# Patient Record
Sex: Male | Born: 1975
Health system: Southern US, Community
[De-identification: ages and names within clinical notes are randomized; demographics above are authoritative.]

## PROBLEM LIST (undated history)

## (undated) DIAGNOSIS — Z952 Presence of prosthetic heart valve: Secondary | ICD-10-CM

## (undated) DIAGNOSIS — I428 Other cardiomyopathies: Secondary | ICD-10-CM

## (undated) DIAGNOSIS — I350 Nonrheumatic aortic (valve) stenosis: Secondary | ICD-10-CM

## (undated) DIAGNOSIS — I509 Heart failure, unspecified: Secondary | ICD-10-CM

## (undated) DIAGNOSIS — Q231 Congenital insufficiency of aortic valve: Secondary | ICD-10-CM

## (undated) HISTORY — DX: Presence of prosthetic heart valve: Z95.2

## (undated) HISTORY — DX: Nonrheumatic aortic (valve) stenosis: I35.0

## (undated) HISTORY — PX: SURGERY SCROTAL / TESTICULAR: SUR1316

## (undated) HISTORY — DX: Other cardiomyopathies: I42.8

## (undated) HISTORY — DX: Congenital insufficiency of aortic valve: Q23.1

---

## 2009-01-26 ENCOUNTER — Emergency Department: Payer: Self-pay | Admitting: Emergency Medicine

## 2009-01-30 ENCOUNTER — Emergency Department: Payer: Self-pay | Admitting: Emergency Medicine

## 2009-10-11 ENCOUNTER — Emergency Department: Payer: Self-pay | Admitting: Emergency Medicine

## 2009-10-12 ENCOUNTER — Emergency Department: Payer: Self-pay | Admitting: Emergency Medicine

## 2013-07-29 ENCOUNTER — Emergency Department: Payer: Self-pay | Admitting: Emergency Medicine

## 2014-09-27 DIAGNOSIS — Q231 Congenital insufficiency of aortic valve: Secondary | ICD-10-CM

## 2014-09-27 DIAGNOSIS — I428 Other cardiomyopathies: Secondary | ICD-10-CM

## 2014-09-27 DIAGNOSIS — Q2381 Bicuspid aortic valve: Secondary | ICD-10-CM

## 2014-09-27 HISTORY — DX: Other cardiomyopathies: I42.8

## 2014-09-27 HISTORY — DX: Congenital insufficiency of aortic valve: Q23.1

## 2014-09-27 HISTORY — DX: Bicuspid aortic valve: Q23.81

## 2014-10-03 ENCOUNTER — Inpatient Hospital Stay
Admission: EM | Admit: 2014-10-03 | Discharge: 2014-10-04 | DRG: 229 | Disposition: A | Discharge status: Other Acute Inpatient Hospital | Payer: Self-pay | Attending: Internal Medicine | Admitting: Internal Medicine

## 2014-10-03 ENCOUNTER — Encounter: Payer: Self-pay | Admitting: *Deleted

## 2014-10-03 ENCOUNTER — Emergency Department: Payer: Self-pay

## 2014-10-03 DIAGNOSIS — R079 Chest pain, unspecified: Secondary | ICD-10-CM

## 2014-10-03 DIAGNOSIS — I252 Old myocardial infarction: Secondary | ICD-10-CM | POA: Diagnosis present

## 2014-10-03 DIAGNOSIS — I442 Atrioventricular block, complete: Secondary | ICD-10-CM | POA: Diagnosis present

## 2014-10-03 DIAGNOSIS — I350 Nonrheumatic aortic (valve) stenosis: Secondary | ICD-10-CM

## 2014-10-03 DIAGNOSIS — R0609 Other forms of dyspnea: Secondary | ICD-10-CM | POA: Diagnosis present

## 2014-10-03 DIAGNOSIS — F17210 Nicotine dependence, cigarettes, uncomplicated: Secondary | ICD-10-CM | POA: Diagnosis present

## 2014-10-03 DIAGNOSIS — R0600 Dyspnea, unspecified: Secondary | ICD-10-CM | POA: Diagnosis present

## 2014-10-03 DIAGNOSIS — I214 Non-ST elevation (NSTEMI) myocardial infarction: Principal | ICD-10-CM | POA: Diagnosis present

## 2014-10-03 MED ORDER — SODIUM CHLORIDE 0.9 % IV BOLUS (SEPSIS)
1000.0000 mL | Freq: Once | INTRAVENOUS | Status: AC
  Administered 2014-10-03: 1000 mL via INTRAVENOUS

## 2014-10-03 MED ORDER — ASPIRIN 81 MG PO CHEW
CHEWABLE_TABLET | ORAL | Status: AC
  Administered 2014-10-03: 324 mg
  Filled 2014-10-03: qty 4

## 2014-10-03 MED ORDER — ASPIRIN 81 MG PO CHEW
324.0000 mg | CHEWABLE_TABLET | Freq: Once | ORAL | Status: AC
  Administered 2014-10-03: 324 mg via ORAL

## 2014-10-03 NOTE — ED Notes (Signed)
Pt placed on oxygen at 2 lpm via n/c.

## 2014-10-03 NOTE — ED Notes (Addendum)
Pt c/o chest and epigastric pain that is reproducible w/ palpation, movement, cough and inspiration. Pt c/o nausea, denies vomiting. Pt states pain worsening x 30 minutes ago. Pt describes pain as pressure. Pain is non-radiating and makes pt feel short of breath.

## 2014-10-03 NOTE — ED Provider Notes (Signed)
[facility] Emergency Department Provider Note  ____________________________________________  Time seen: 2325  I have reviewed the triage vital signs and the nursing notes.   HISTORY  Chief Complaint Pleurisy  chest pain, upper abdominal pain, nausea   HPI Salif Tay is a 39 y.o. male without known medical problems and he does not take any medications who reports he began having chest pain yesterday and it became worse tonight. The pain is in the lower chest with some into the upper abdomen. The pain is moderate to severe. The patient looks uncomfortable. The pain is not radiating to arms or jaw. He has not had pain like this before. He does feel nauseous with no emesis. He does smoke 4-5 cigarettes per day. He does not have a significant family history for cardiac disease.   History reviewed. No pertinent past medical history.  Patient Active Problem List   Diagnosis Date Noted  . NSTEMI, initial episode of care 10/04/2014  . Third degree heart block 10/04/2014    Past Surgical History  Procedure Laterality Date  . Surgery scrotal / testicular      for undescended testicle as a child    No current outpatient prescriptions on file.  Allergies Review of patient's allergies indicates no known allergies.  History reviewed. No pertinent family history.  Social History History  Substance Use Topics  . Smoking status: Current Every Day Smoker -- 0.50 packs/day for 20 years    Types: Cigarettes  . Smokeless tobacco: Never Used  . Alcohol Use: 0.0 oz/week    0 Standard drinks or equivalent per week     Comment: rarely    Review of Systems Constitutional: Negative for fever. ENT: Negative for sore throat. Cardiovascular: Positive for chest pain. Respiratory: Negative for shortness of breath. Gastrointestinal: Positive for abdominal pain, with nausea Genitourinary: Negative for dysuria. Musculoskeletal: Negative for back pain. Skin:  Negative for rash. Neurological: Negative for headaches   10-point ROS otherwise negative.  ____________________________________________   PHYSICAL EXAM:  VITAL SIGNS: ED Triage Vitals  Enc Vitals Group     BP 10/03/14 2308 117/45 mmHg     Pulse Rate 10/03/14 2308 58     Resp 10/03/14 2308 22     Temp 10/03/14 2308 100.7 F (38.2 C)     Temp Source 10/03/14 2308 Oral     SpO2 10/03/14 2308 83 %     Weight 10/03/14 2308 220 lb (99.791 kg)     Height 10/03/14 2308  (1.88 m)     Head Cir --      Peak Flow --      Pain Score 10/03/14 2309 10     Pain Loc --      Pain Edu? --      Excl. in GC? --     Constitutional: Alert and oriented. He appears uncomfortable.Marland Kitchen ENT   Head: Normocephalic and atraumatic.   Nose: No congestion/rhinnorhea.   Mouth/Throat: Mucous membranes are moist. Cardiovascular: Normal rate, regular rhythm. Occasional PVC on EKG Respiratory: Normal respiratory effort without tachypnea. Breath sounds are clear and equal bilaterally. No wheezes/rales/rhonchi. Gastrointestinal: Soft , tender in the epigastric area and the right lower quadrant. He had minimal tenderness in the right upper quadrant, but on reexam immediately during the first examination he displayed additional tenderness. Back: There is no CVA tenderness. Musculoskeletal: Nontender with normal range of motion in all extremities.  No noted edema. Neurologic:  Normal speech and language. No gross focal neurologic deficits are  appreciated.  Skin:  Skin is warm, dry. No rash noted. Psychiatric: Mood and affect are normal. Speech and behavior are normal.  ____________________________________________     INITIAL IMPRESSION / ASSESSMENT AND PLAN / ED COURSE  It is unclear what is causing this patient's discomfort and distress. We are considering both cardiac and abdominal issues for this patient. The EKG looks somewhat abnormal although not indicative of an acute MI. We will continue  to focus on possible cardiac issues as well as abdominal issues. Will order an ultrasound of the right upper quadrant and check a lipase level.   LABS (pertinent positives/negatives)  Notable for elevated white blood cell count of 17.1 hemoglobin of 13.6. Electrolytes are normal overall with normal renal function. He does have an elevated troponin at 0.08.   ____________________________________________   EKG  At 2323 undetermined rhythm at a rate of 93. It does seem to appear to be a lack of correlation with P waves and QRS, worrisome for third-degree block. We will repeat EKG.   Repeat EKG at 120 shows a change from was likely a third-degree block to sinus bradycardia with a first-degree block. The rate is 49. The PR interval is 296. QTC is 496 with a normal access. ____________________________________________    RADIOLOGY  Chest x-ray shows mild congestion.  ____________________________________________   PROCEDURES  Procedure(s) performed: None  Critical Care performed: Yes, see critical care note(s)  ____________________________________________   INITIAL IMPRESSION / ASSESSMENT AND PLAN / ED COURSE  ----------------------------------------- 1:36 AM on 10/04/2014 -----------------------------------------  At this point the blood tests are back. The EKG has changed to a first-degree block from what appeared to be a third-degree block. The patient continues to have some discomfort. It is not clear at this time if he is having a N STEMI or there is a different cause for his mild elevation in troponin and discomfort. I have discussed the case with Dr. Sherry Ruffing who will admit him to the hospital.  Due to the complexity of this patient's case and complex differential diagnosis, critical care time of 35 minutes was spent caring for the patient discussing the situation with him and speaking with other physicians.  ____________________________________________   FINAL CLINICAL  IMPRESSION(S) / ED DIAGNOSES  Final diagnoses:  Chest pain, unspecified chest pain type  Third degree heart block      Darien Ramus, MD 10/04/14 [phone]

## 2014-10-04 ENCOUNTER — Encounter: Admission: EM | Payer: Self-pay | Source: Home / Self Care | Attending: Internal Medicine

## 2014-10-04 ENCOUNTER — Inpatient Hospital Stay: Payer: Self-pay

## 2014-10-04 ENCOUNTER — Encounter: Payer: Self-pay | Admitting: Physician Assistant

## 2014-10-04 ENCOUNTER — Inpatient Hospital Stay (HOSPITAL_COMMUNITY)
Admission: EM | Admit: 2014-10-04 | Discharge: 2014-10-07 | DRG: 280 | Disposition: A | Discharge status: Home / Self Care | Payer: Self-pay | Source: Other Acute Inpatient Hospital | Attending: Cardiology | Admitting: Cardiology

## 2014-10-04 ENCOUNTER — Encounter: Payer: Self-pay | Admitting: Internal Medicine

## 2014-10-04 DIAGNOSIS — R509 Fever, unspecified: Secondary | ICD-10-CM | POA: Diagnosis present

## 2014-10-04 DIAGNOSIS — R0600 Dyspnea, unspecified: Secondary | ICD-10-CM | POA: Diagnosis present

## 2014-10-04 DIAGNOSIS — I509 Heart failure, unspecified: Secondary | ICD-10-CM

## 2014-10-04 DIAGNOSIS — I420 Dilated cardiomyopathy: Secondary | ICD-10-CM | POA: Diagnosis present

## 2014-10-04 DIAGNOSIS — J9601 Acute respiratory failure with hypoxia: Secondary | ICD-10-CM | POA: Diagnosis present

## 2014-10-04 DIAGNOSIS — D72829 Elevated white blood cell count, unspecified: Secondary | ICD-10-CM | POA: Diagnosis present

## 2014-10-04 DIAGNOSIS — I252 Old myocardial infarction: Secondary | ICD-10-CM | POA: Diagnosis present

## 2014-10-04 DIAGNOSIS — F17200 Nicotine dependence, unspecified, uncomplicated: Secondary | ICD-10-CM | POA: Diagnosis present

## 2014-10-04 DIAGNOSIS — I214 Non-ST elevation (NSTEMI) myocardial infarction: Principal | ICD-10-CM | POA: Diagnosis present

## 2014-10-04 DIAGNOSIS — R0609 Other forms of dyspnea: Secondary | ICD-10-CM | POA: Diagnosis present

## 2014-10-04 DIAGNOSIS — I440 Atrioventricular block, first degree: Secondary | ICD-10-CM | POA: Diagnosis present

## 2014-10-04 DIAGNOSIS — Q231 Congenital insufficiency of aortic valve: Secondary | ICD-10-CM

## 2014-10-04 DIAGNOSIS — I5041 Acute combined systolic (congestive) and diastolic (congestive) heart failure: Secondary | ICD-10-CM | POA: Diagnosis present

## 2014-10-04 DIAGNOSIS — F17210 Nicotine dependence, cigarettes, uncomplicated: Secondary | ICD-10-CM | POA: Diagnosis present

## 2014-10-04 DIAGNOSIS — I350 Nonrheumatic aortic (valve) stenosis: Secondary | ICD-10-CM

## 2014-10-04 DIAGNOSIS — I442 Atrioventricular block, complete: Secondary | ICD-10-CM | POA: Diagnosis present

## 2014-10-04 DIAGNOSIS — I200 Unstable angina: Secondary | ICD-10-CM

## 2014-10-04 DIAGNOSIS — K088 Other specified disorders of teeth and supporting structures: Secondary | ICD-10-CM | POA: Diagnosis present

## 2014-10-04 DIAGNOSIS — K089 Disorder of teeth and supporting structures, unspecified: Secondary | ICD-10-CM | POA: Diagnosis present

## 2014-10-04 HISTORY — DX: Nonrheumatic aortic (valve) stenosis: I35.0

## 2014-10-04 HISTORY — PX: CARDIAC CATHETERIZATION: SHX172

## 2014-10-04 LAB — CBC WITH DIFFERENTIAL/PLATELET
BASOS ABS: 0.1 10*3/uL (ref 0–0.1)
BASOS PCT: 0 %
Basophils Absolute: 0.1 10*3/uL (ref 0–0.1)
Basophils Relative: 0 %
EOS ABS: 0.1 10*3/uL (ref 0–0.7)
EOS ABS: 0.1 10*3/uL (ref 0–0.7)
EOS PCT: 1 %
Eosinophils Relative: 1 %
HCT: 40.7 % (ref 40.0–52.0)
HCT: 39.7 % — ABNORMAL LOW (ref 40.0–52.0)
Hemoglobin: 13.6 g/dL (ref 13.0–18.0)
Hemoglobin: 13.1 g/dL (ref 13.0–18.0)
Lymphocytes Relative: 12 %
Lymphocytes Relative: 19 %
Lymphs Abs: 2 10*3/uL (ref 1.0–3.6)
Lymphs Abs: 3 10*3/uL (ref 1.0–3.6)
MCH: 30.8 pg (ref 26.0–34.0)
MCH: 30.5 pg (ref 26.0–34.0)
MCHC: 33.4 g/dL (ref 32.0–36.0)
MCHC: 33 g/dL (ref 32.0–36.0)
MCV: 92.3 fL (ref 80.0–100.0)
MCV: 92.5 fL (ref 80.0–100.0)
Monocytes Absolute: 0.8 10*3/uL (ref 0.2–1.0)
Monocytes Absolute: 0.9 10*3/uL (ref 0.2–1.0)
Monocytes Relative: 5 %
Monocytes Relative: 6 %
NEUTROS PCT: 82 %
NEUTROS PCT: 74 %
Neutro Abs: 14.1 10*3/uL — ABNORMAL HIGH (ref 1.4–6.5)
Neutro Abs: 11.8 10*3/uL — ABNORMAL HIGH (ref 1.4–6.5)
PLATELETS: 136 10*3/uL — AB (ref 150–440)
PLATELETS: 139 10*3/uL — AB (ref 150–440)
RBC: 4.41 MIL/uL (ref 4.40–5.90)
RBC: 4.29 MIL/uL — ABNORMAL LOW (ref 4.40–5.90)
RDW: 13.5 % (ref 11.5–14.5)
RDW: 13.3 % (ref 11.5–14.5)
WBC: 17.1 10*3/uL — ABNORMAL HIGH (ref 3.8–10.6)
WBC: 16 10*3/uL — AB (ref 3.8–10.6)

## 2014-10-04 LAB — POCT I-STAT 3, ART BLOOD GAS (G3+)
ACID-BASE DEFICIT: 3 mmol/L — AB (ref 0.0–2.0)
Bicarbonate: 19.9 mEq/L — ABNORMAL LOW (ref 20.0–24.0)
O2 Saturation: 79 %
PH ART: 7.459 — AB (ref 7.350–7.450)
PO2 ART: 40 mmHg — AB (ref 80.0–100.0)
TCO2: 21 mmol/L (ref 0–100)
pCO2 arterial: 28 mmHg — ABNORMAL LOW (ref 35.0–45.0)

## 2014-10-04 LAB — TROPONIN I
TROPONIN I: 0.08 ng/mL — AB (ref <0.031)
TROPONIN I: 0.06 ng/mL — AB (ref <0.031)
TROPONIN I: 0.08 ng/mL — AB (ref <0.031)
Troponin I: 0.14 ng/mL — ABNORMAL HIGH (ref <0.031)

## 2014-10-04 LAB — URINE DRUG SCREEN, QUALITATIVE (ARMC ONLY)
AMPHETAMINES, UR SCREEN: NOT DETECTED
Barbiturates, Ur Screen: NOT DETECTED
Benzodiazepine, Ur Scrn: NOT DETECTED
Cannabinoid 50 Ng, Ur ~~LOC~~: NOT DETECTED
Cocaine Metabolite,Ur ~~LOC~~: NOT DETECTED
MDMA (Ecstasy)Ur Screen: NOT DETECTED
METHADONE SCREEN, URINE: NOT DETECTED
OPIATE, UR SCREEN: NOT DETECTED
Phencyclidine (PCP) Ur S: NOT DETECTED
TRICYCLIC, UR SCREEN: NOT DETECTED

## 2014-10-04 LAB — MRSA PCR SCREENING: MRSA by PCR: NEGATIVE

## 2014-10-04 LAB — COMPREHENSIVE METABOLIC PANEL
ALBUMIN: 3.7 g/dL (ref 3.5–5.0)
ALK PHOS: 65 U/L (ref 38–126)
ALT: 35 U/L (ref 17–63)
ALT: 31 U/L (ref 17–63)
ANION GAP: 8 (ref 5–15)
ANION GAP: 8 (ref 5–15)
AST: 35 U/L (ref 15–41)
AST: 27 U/L (ref 15–41)
Albumin: 4.2 g/dL (ref 3.5–5.0)
Alkaline Phosphatase: 60 U/L (ref 38–126)
BILIRUBIN TOTAL: 1.5 mg/dL — AB (ref 0.3–1.2)
BILIRUBIN TOTAL: 2.2 mg/dL — AB (ref 0.3–1.2)
BUN: 14 mg/dL (ref 6–20)
BUN: 13 mg/dL (ref 6–20)
CALCIUM: 8.9 mg/dL (ref 8.9–10.3)
CALCIUM: 8.5 mg/dL — AB (ref 8.9–10.3)
CHLORIDE: 110 mmol/L (ref 101–111)
CO2: 23 mmol/L (ref 22–32)
CO2: 23 mmol/L (ref 22–32)
Chloride: 107 mmol/L (ref 101–111)
Creatinine, Ser: 1.02 mg/dL (ref 0.61–1.24)
Creatinine, Ser: 0.88 mg/dL (ref 0.61–1.24)
Glucose, Bld: 118 mg/dL — ABNORMAL HIGH (ref 65–99)
Glucose, Bld: 110 mg/dL — ABNORMAL HIGH (ref 65–99)
Potassium: 4.2 mmol/L (ref 3.5–5.1)
Potassium: 4.1 mmol/L (ref 3.5–5.1)
SODIUM: 138 mmol/L (ref 135–145)
Sodium: 141 mmol/L (ref 135–145)
Total Protein: 7.4 g/dL (ref 6.5–8.1)
Total Protein: 7.1 g/dL (ref 6.5–8.1)

## 2014-10-04 LAB — URINALYSIS COMPLETE WITH MICROSCOPIC (ARMC ONLY)
BILIRUBIN URINE: NEGATIVE
Glucose, UA: NEGATIVE mg/dL
HGB URINE DIPSTICK: NEGATIVE
KETONES UR: NEGATIVE mg/dL
NITRITE: NEGATIVE
PH: 7 (ref 5.0–8.0)
Protein, ur: NEGATIVE mg/dL
SQUAMOUS EPITHELIAL / LPF: NONE SEEN
Specific Gravity, Urine: 1.009 (ref 1.005–1.030)

## 2014-10-04 LAB — URINALYSIS, ROUTINE W REFLEX MICROSCOPIC
Bilirubin Urine: NEGATIVE
Glucose, UA: NEGATIVE mg/dL
Hgb urine dipstick: NEGATIVE
Ketones, ur: NEGATIVE mg/dL
LEUKOCYTES UA: NEGATIVE
NITRITE: NEGATIVE
Protein, ur: NEGATIVE mg/dL
Specific Gravity, Urine: 1.015 (ref 1.005–1.030)
Urobilinogen, UA: 0.2 mg/dL (ref 0.0–1.0)
pH: 5.5 (ref 5.0–8.0)

## 2014-10-04 LAB — CK TOTAL AND CKMB (NOT AT ARMC)
CK, MB: 2.7 ng/mL (ref 0.5–5.0)
Relative Index: 1.9 (ref 0.0–2.5)
Total CK: 142 U/L (ref 49–397)

## 2014-10-04 LAB — TSH: TSH: 1.917 u[IU]/mL (ref 0.350–4.500)

## 2014-10-04 LAB — HEPARIN LEVEL (UNFRACTIONATED): Heparin Unfractionated: 0.41 IU/mL (ref 0.30–0.70)

## 2014-10-04 LAB — POCT ACTIVATED CLOTTING TIME: Activated Clotting Time: 429 seconds

## 2014-10-04 LAB — BRAIN NATRIURETIC PEPTIDE: B NATRIURETIC PEPTIDE 5: 1037.2 pg/mL — AB (ref 0.0–100.0)

## 2014-10-04 LAB — LIPASE, BLOOD: Lipase: 24 U/L (ref 22–51)

## 2014-10-04 LAB — MAGNESIUM: Magnesium: 1.8 mg/dL (ref 1.7–2.4)

## 2014-10-04 SURGERY — LEFT HEART CATH AND CORONARY ANGIOGRAPHY
Anesthesia: LOCAL

## 2014-10-04 MED ORDER — SODIUM CHLORIDE 0.9 % IJ SOLN: 3.0000 mL | Freq: Two times a day (BID) | INTRAMUSCULAR | Status: DC

## 2014-10-04 MED ORDER — MORPHINE SULFATE 2 MG/ML IJ SOLN
2.0000 mg | INTRAMUSCULAR | Status: DC | PRN
  Administered 2014-10-04: 2 mg via INTRAVENOUS
  Filled 2014-10-04 (×2): qty 1

## 2014-10-04 MED ORDER — ONDANSETRON HCL 4 MG/2ML IJ SOLN: 4.0000 mg | Freq: Four times a day (QID) | INTRAMUSCULAR | Status: DC | PRN

## 2014-10-04 MED ORDER — SODIUM CHLORIDE 0.9 % IJ SOLN: 3.0000 mL | INTRAMUSCULAR | Status: DC | PRN

## 2014-10-04 MED ORDER — ATORVASTATIN CALCIUM 80 MG PO TABS
80.0000 mg | ORAL_TABLET | Freq: Every day | ORAL | Status: DC
  Administered 2014-10-05 – 2014-10-06 (×2): 80 mg via ORAL
  Filled 2014-10-04 (×4): qty 1

## 2014-10-04 MED ORDER — IOHEXOL 350 MG/ML SOLN
INTRAVENOUS | Status: DC | PRN
Start: 2014-10-04 — End: 2014-10-04
  Administered 2014-10-04: 175 mL via INTRA_ARTERIAL

## 2014-10-04 MED ORDER — ASPIRIN EC 325 MG PO TBEC
325.0000 mg | DELAYED_RELEASE_TABLET | Freq: Every day | ORAL | Status: DC
  Filled 2014-10-04: qty 1

## 2014-10-04 MED ORDER — FUROSEMIDE 10 MG/ML IJ SOLN
INTRAMUSCULAR | Status: DC | PRN
  Administered 2014-10-04: 40 mg via INTRAVENOUS

## 2014-10-04 MED ORDER — ACETAMINOPHEN 325 MG PO TABS: 650.0000 mg | ORAL_TABLET | ORAL | Status: DC | PRN

## 2014-10-04 MED ORDER — SODIUM CHLORIDE 0.9 % IJ SOLN
3.0000 mL | Freq: Two times a day (BID) | INTRAMUSCULAR | Status: DC
  Administered 2014-10-04 – 2014-10-06 (×4): 3 mL via INTRAVENOUS
  Administered 2014-10-07: 10 mL via INTRAVENOUS

## 2014-10-04 MED ORDER — MIDAZOLAM HCL 2 MG/2ML IJ SOLN
INTRAMUSCULAR | Status: DC | PRN
  Administered 2014-10-04: 1 mg via INTRAVENOUS

## 2014-10-04 MED ORDER — ALPRAZOLAM 0.25 MG PO TABS: 0.2500 mg | ORAL_TABLET | Freq: Four times a day (QID) | ORAL | Status: DC | PRN

## 2014-10-04 MED ORDER — ACETAMINOPHEN 325 MG PO TABS
650.0000 mg | ORAL_TABLET | Freq: Four times a day (QID) | ORAL | Status: DC | PRN
Start: 2014-10-04 — End: 2014-10-04

## 2014-10-04 MED ORDER — ACETAMINOPHEN 650 MG RE SUPP: 650.0000 mg | Freq: Four times a day (QID) | RECTAL | Status: DC | PRN

## 2014-10-04 MED ORDER — SODIUM CHLORIDE 0.9 % IV SOLN: 250.0000 mL | INTRAVENOUS | Status: DC | PRN

## 2014-10-04 MED ORDER — HEPARIN BOLUS VIA INFUSION
4000.0000 [IU] | Freq: Once | INTRAVENOUS | Status: AC
  Administered 2014-10-04: 4000 [IU] via INTRAVENOUS
  Filled 2014-10-04: qty 4000

## 2014-10-04 MED ORDER — ATORVASTATIN CALCIUM 20 MG PO TABS: 80.0000 mg | ORAL_TABLET | Freq: Every day | ORAL | Status: DC

## 2014-10-04 MED ORDER — ENOXAPARIN SODIUM 100 MG/ML ~~LOC~~ SOLN
1.0000 mg/kg | Freq: Two times a day (BID) | SUBCUTANEOUS | Status: DC
  Administered 2014-10-04: 100 mg via SUBCUTANEOUS
  Filled 2014-10-04: qty 1

## 2014-10-04 MED ORDER — PNEUMOCOCCAL VAC POLYVALENT 25 MCG/0.5ML IJ INJ
0.5000 mL | INJECTION | INTRAMUSCULAR | Status: DC
  Filled 2014-10-04: qty 0.5

## 2014-10-04 MED ORDER — FUROSEMIDE 10 MG/ML IJ SOLN: 40.0000 mg | Freq: Two times a day (BID) | INTRAMUSCULAR | Status: DC

## 2014-10-04 MED ORDER — NITROGLYCERIN 0.4 MG SL SUBL
0.4000 mg | SUBLINGUAL_TABLET | SUBLINGUAL | Status: DC | PRN
  Administered 2014-10-04: 0.4 mg via SUBLINGUAL
  Filled 2014-10-04 (×2): qty 1

## 2014-10-04 MED ORDER — DIAZEPAM 2 MG PO TABS: 2.0000 mg | ORAL_TABLET | ORAL | Status: DC | PRN

## 2014-10-04 MED ORDER — ZOLPIDEM TARTRATE 5 MG PO TABS: 5.0000 mg | ORAL_TABLET | Freq: Every evening | ORAL | Status: DC | PRN

## 2014-10-04 MED ORDER — SODIUM CHLORIDE 0.9 % WEIGHT BASED INFUSION: 1.0000 mL/kg/h | INTRAVENOUS | Status: DC

## 2014-10-04 MED ORDER — HEPARIN (PORCINE) IN NACL 100-0.45 UNIT/ML-% IJ SOLN
1600.0000 [IU]/h | INTRAMUSCULAR | Status: DC
  Administered 2014-10-04: 1300 [IU]/h via INTRAVENOUS
  Administered 2014-10-05: 1600 [IU]/h via INTRAVENOUS
  Filled 2014-10-04 (×4): qty 250

## 2014-10-04 MED ORDER — ONDANSETRON HCL 4 MG PO TABS: 4.0000 mg | ORAL_TABLET | Freq: Four times a day (QID) | ORAL | Status: DC | PRN

## 2014-10-04 MED ORDER — SODIUM CHLORIDE 0.9 % IV SOLN
INTRAVENOUS | Status: DC
  Administered 2014-10-04 – 2014-10-05 (×2): via INTRAVENOUS

## 2014-10-04 MED ORDER — IPRATROPIUM-ALBUTEROL 0.5-2.5 (3) MG/3ML IN SOLN: 3.0000 mL | RESPIRATORY_TRACT | Status: DC | PRN

## 2014-10-04 MED ORDER — NITROGLYCERIN 0.4 MG SL SUBL: 0.4000 mg | SUBLINGUAL_TABLET | SUBLINGUAL | Status: DC | PRN

## 2014-10-04 MED ORDER — ENOXAPARIN SODIUM 40 MG/0.4ML ~~LOC~~ SOLN: 40.0000 mg | SUBCUTANEOUS | Status: DC

## 2014-10-04 MED ORDER — MORPHINE SULFATE 2 MG/ML IJ SOLN
1.0000 mg | INTRAMUSCULAR | Status: DC | PRN
Start: 2014-10-04 — End: 2014-10-04

## 2014-10-04 MED ORDER — MORPHINE SULFATE 2 MG/ML IJ SOLN
2.0000 mg | INTRAMUSCULAR | Status: AC
  Administered 2014-10-04: 2 mg via INTRAVENOUS

## 2014-10-04 MED ORDER — ACETAMINOPHEN 325 MG PO TABS
650.0000 mg | ORAL_TABLET | ORAL | Status: DC | PRN
  Administered 2014-10-04 – 2014-10-05 (×3): 650 mg via ORAL
  Filled 2014-10-04 (×3): qty 2

## 2014-10-04 MED ORDER — ASPIRIN EC 81 MG PO TBEC
81.0000 mg | DELAYED_RELEASE_TABLET | Freq: Every day | ORAL | Status: DC
  Administered 2014-10-05 – 2014-10-07 (×3): 81 mg via ORAL
  Filled 2014-10-04 (×3): qty 1

## 2014-10-04 SURGICAL SUPPLY — 16 items
CATH INFINITI 5FR ANG PIGTAIL (CATHETERS) ×3
CATH OPTITORQUE TIG 4.0 5F (CATHETERS) ×3
CATH S G BIP PACING (SET/KITS/TRAYS/PACK) ×3
CATHETER LAUNCHER 6FR 3DRIGHT (CATHETERS) ×3
DEVICE WIRE ANGIOSEAL 6FR (Vascular Products) ×3 IMPLANT
ELECT DEFIB PAD ADLT CADENCE (PAD) ×3
KIT ENCORE 26 ADVANTAGE (KITS) ×3
KIT HEART LEFT (KITS) ×3
PACK CARDIAC CATHETERIZATION (CUSTOM PROCEDURE TRAY) ×3
SHEATH PINNACLE 5F 10CM (SHEATH)
SHEATH PINNACLE 6F 10CM (SHEATH) ×6
SLEEVE REPOSITIONING LENGTH 30 (MISCELLANEOUS) ×3
SYR MEDRAD MARK V 150ML (SYRINGE) ×3
TRANSDUCER W/STOPCOCK (MISCELLANEOUS) ×3
TUBING CIL FLEX 10 FLL-RA (TUBING) ×3
WIRE EMERALD 3MM-J .035X150CM (WIRE) ×3

## 2014-10-04 NOTE — H&P (Signed)
History and Physical   Patient ID: Nicholas Horton MRN: [id number], DOB/AGE: 1975-09-09 39 y.o. Date of Encounter: 10/04/2014  Primary Physician: No PCP Per Patient Primary Cardiologist: [place] - [place]  Chief Complaint:  STEMI  HPI: Nicholas Horton is a 39 y.o. male with no history of CAD. He has no ongoing medical issues. He has not had a recent checkup.   He went to [facility] 10/03/2014 for chest pain and SOB.   The patient had a cough and SOB starting 3 days ago. He then developed palpitations and chest pain, pressure. Upon arrival at [facility], he was in 3rd degree heart block, heart rates in the 40s and 50s in a junctional rhythm. His symptoms did not improve and his troponin was elevated. Of note he had a low-grade fever of 100.7.   He was anticoagulated with Lovenox 100 mg at 2:52 am, given SL NTG x 1, Zofran 4 mg, ASA 324 mg, nebulizers, Lipitor 80 mg and also given 2 mg morphine.   Because of the 3rd degree heart block and the elevated enzymes as well as ongoing chest pain, he was transferred emergently to [facility] and taken directly to the cath lab.  No past medical history on file. Pt not aware of any ongoing medical problems, no history of DM, HTN, HL.    Surgical History:  Past Surgical History  Procedure Laterality Date  . Surgery scrotal / testicular      for undescended testicle as a child     I have reviewed the patient's current medications. Prior to Admission medications   None   Allergies: No Known Allergies  History   Social History  . Marital Status: Single    Spouse Name: N/A  . Number of Children: N/A  . Years of Education: N/A   Occupational History  . 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    Social History Main Topics  . Smoking status: Current Every Day Smoker -- 0.50 packs/day for 20 years    Types: Cigarettes  . Smokeless tobacco: Never Used  . Alcohol Use: 0.0 oz/week    0 Standard drinks or equivalent per week     Comment: rarely  . Drug Use: No    . Sexual Activity: Not on file   Other Topics Concern  . Not on file   Social History Narrative   Lives in [place], [place] with fiance. No history of CAD or PPM, arrhythmia in parents or siblings.    No family history on file. Family Status  Relation Status Death Age  . Mother Alive   . Father Alive     Review of Systems:   Full 14-point review of systems otherwise negative except as noted above.  Physical Exam: General: Well developed, well nourished,male in acute distress. Head: Normocephalic, atraumatic, sclera non-icteric, no xanthomas, nares are without discharge. Dentition: very poor Neck: No carotid bruits. JVD not elevated. No thyromegally Lungs: Good expansion bilaterally. without wheezes or rhonchi. Rales bases Heart: Regular rate and rhythm with S1 S2.  No S3 or S4.  No murmur, no rubs, or gallops appreciated. Abdomen: Soft, non-tender, non-distended with normoactive bowel sounds. No hepatomegaly. No rebound/guarding. No obvious abdominal masses. Msk:  Strength and tone appear normal for age. No joint deformities or effusions, no spine or costo-vertebral angle tenderness. Extremities: No clubbing or cyanosis. No edema.  Distal pedal pulses are 2+ in 4 extrem Neuro: Alert and oriented X 3. Moves all extremities spontaneously. No focal deficits noted. Psych:  Responds to questions appropriately but is anxious. Skin: No rashes or lesions noted  Labs:   Lab Results  Component Value Date   WBC 16.0* 10/04/2014   HGB 13.1 10/04/2014   HCT 39.7* 10/04/2014   MCV 92.5 10/04/2014   PLT 139* 10/04/2014    Recent Labs Lab 10/03/14 2320  NA 138  K 4.2  CL 107  CO2 23  BUN 14  CREATININE 1.02  CALCIUM 8.9  PROT 7.4  BILITOT 1.5*  ALKPHOS 60  ALT 35  AST 35  GLUCOSE 118*    Recent Labs  10/03/14 2320  TROPONINI 0.08*    Radiology/Studies: Dg Chest Port 1 View 10/04/2014   CLINICAL DATA:  Sternal chest pain, onset yesterday and worsened today. Dyspnea.   EXAM: PORTABLE CHEST - 1 VIEW  COMPARISON:  10/13/2009  FINDINGS: Moderate vascular and interstitial congestive changes are present, new. There is mild ground-glass opacity in the central basilar regions which may represent alveolar edema. No large effusions are evident.  IMPRESSION: Probable congestive heart failure   Electronically Signed   By: Ellery Plunk M.D.   On: 10/04/2014 00:59   ECG: 3rd degree heart block, junctional rhythm  ASSESSMENT AND PLAN:  Principal Problem:   Third degree heart block Active Problems:   NSTEMI, initial episode of care  Pt is being taken directly to the cath lab with further evaluation and treatment depending on the results. We will check an echo, diurese as needed, and screen for cardiac risk factors.  Nicholas Horton 10/04/2014 8:12 AM Beeper [phone]   I have seen, examined and evaluated the patient this AM along with Ms. Doristine Devoid.Marland Kitchen  After reviewing all the available data and chart,  I agree with her findings, examination as well as impression recommendations.  39 year old gentleman presenting to [facility] with substernal chest pain and profound dyspnea found to be in complete heart block with accelerated junctional escape rhythm with some ventricular escape beats.  He was having persistent substernal chest pain and was therefore transferred to [facility] for acute evaluation with cardiac catheterization to exclude etiology for the complete heart block and chest pain. Also for likely placement of temporary pacemaker.  On arrival the patient was profoundly dyspneic requiring oxygen by nonrebreather. His blood pressures running 110 range and his escape rhythm was in the 50s with some rates in the 40s.  He was still having at least 6 out of 10 substernal chest pressure.  I did discuss the risks, benefits, alternatives and indications of the seizures with the patient, however based on his extreme hypoxic and  dyspneic state the considered emergency consent.   Nicholas Horton, M.D., M.S. Interventional Cardiologist   Pager # [phone]

## 2014-10-04 NOTE — ED Notes (Signed)
Pt assisted with urinal for urine sample. Pt with concentrated yellow urine output. Pt remains on defib pads, pt in first degree heart block. Oxygen continues to infuse at 3lpm via [place]. Pt continues to have tachypnea with rate 22.

## 2014-10-04 NOTE — ED Notes (Signed)
Pt with bigemeny, frequent pvc. Pt placed on crash cart defibrillator pads.

## 2014-10-04 NOTE — ED Notes (Signed)
Pt in first degree heart block.

## 2014-10-04 NOTE — ED Notes (Signed)
Critical troponin called from lab of 0.08. Dr. Almira Bar notified. Pt continues to complain of 9/10 chest pain with inspiration. md notified.

## 2014-10-04 NOTE — ED Notes (Signed)
Pt in 3rd degree heart block on monitor. Dr. Clint Guy at bedside.  Pt remains on defib pads.

## 2014-10-04 NOTE — H&P (Signed)
[facility] [place] [facility]   PATIENT NAME: Nicholas Horton    MR#:  [id number]  DATE OF BIRTH:  Mar 31, 1976   DATE OF ADMISSION:  10/03/2014  PRIMARY CARE PHYSICIAN: No PCP Per Patient   REQUESTING/REFERRING PHYSICIAN: Carollee Massed  CHIEF COMPLAINT:   Chief Complaint  Patient presents with  . Pleurisy   chest pain, shortness of breath  HISTORY OF PRESENT ILLNESS:  Nicholas Horton  is a 39 y.o. male without prior medical history presented with chest pain/shortness of breath. History aided by family member at bedside. They described 2-3 day duration of cough, nonproductive with associated shortness of breath mainly dyspnea on exertion. He is now experiencing palpitations, chest pain retrosternal in location, nonradiating intensity 7-8 out of 10, pressure in quality, no worsening or relieving factors, this is been waxing and waning. Given worsening symptoms including worsening shortness of breath and decided to present to the hospital for further workup and evaluation. Upon arrival to the emergent department noted to be in third-degree heart block with heart rates in the 40s to 50s blood pressure stable.  PAST MEDICAL HISTORY:  History reviewed. No pertinent past medical history.  PAST SURGICAL HISTORY:  History reviewed. No pertinent past surgical history.  SOCIAL HISTORY:   History  Substance Use Topics  . Smoking status: Current Every Day Smoker    Types: Cigarettes  . Smokeless tobacco: Never Used  . Alcohol Use: Yes     Comment: rarely    FAMILY HISTORY:  History reviewed. No pertinent family history.  DRUG ALLERGIES:  No Known Allergies  REVIEW OF SYSTEMS:  REVIEW OF SYSTEMS:  CONSTITUTIONAL: Positive for subjective fevers, chills, fatigue, weakness.  EYES: Denies blurred vision, double vision, or eye pain.  EARS, NOSE, THROAT: Denies tinnitus, ear pain, hearing loss.  RESPIRATORY: Positive for nonproductive cough, shortness of  breath, denies wheezing  CARDIOVASCULAR: Positive chest pain, palpitations, denies edema. Denies orthopnea GASTROINTESTINAL: Denies nausea, vomiting, diarrhea, abdominal pain.  GENITOURINARY: Denies dysuria, hematuria.  ENDOCRINE: Denies nocturia or thyroid problems. HEMATOLOGIC AND LYMPHATIC: Denies easy bruising or bleeding.  SKIN: Denies rash or lesions.  MUSCULOSKELETAL: Denies pain in neck, back, shoulder, knees, hips, or further arthritic symptoms.  NEUROLOGIC: Denies paralysis, paresthesias.  PSYCHIATRIC: Denies anxiety or depressive symptoms. Otherwise full review of systems performed by me is negative.   MEDICATIONS AT HOME:   Prior to Admission medications   Not on File      VITAL SIGNS:  Blood pressure 107/62, pulse 48, temperature 100.7 F (38.2 C), temperature source Oral, resp. rate 22, height  (1.88 m), weight 220 lb (99.791 kg), SpO2 98 %.  PHYSICAL EXAMINATION:  VITAL SIGNS: Filed Vitals:   10/04/14 0215  BP:   Pulse: 48  Temp:   Resp: 22   GENERAL:39 y.o.male ill-appearing HEAD: Normocephalic, atraumatic.  EYES: Pupils equal, round, reactive to light. Extraocular muscles intact. No scleral icterus.  MOUTH: Moist mucosal membrane. Dentition poor. No abscess noted.  EAR, NOSE, THROAT: Clear without exudates. No external lesions.  NECK: Supple. No thyromegaly. No nodules. No JVD.  PULMONARY: Clear to ascultation, without wheeze rails or rhonci. Tachypnea without use of accessory muscles, Good respiratory effort. good air entry bilaterally CHEST: Nontender to palpation.  CARDIOVASCULAR: S1 and S2. Regular rate and rhythm. Positive friction rub. Trace edema. Pedal pulses 2+ bilaterally.  GASTROINTESTINAL: Soft, nontender, nondistended. No masses. Positive bowel sounds. No hepatosplenomegaly.  MUSCULOSKELETAL: No swelling, clubbing, or edema. Range of motion full in all  extremities.  NEUROLOGIC: Cranial nerves II through XII are intact. No gross focal  neurological deficits. Sensation intact. Reflexes intact.  SKIN: No ulceration, lesions, rashes, or cyanosis. Skin warm and dry. Turgor intact.  PSYCHIATRIC: Mood, affect within normal limits. The patient is awake, alert and oriented x 3. Insight, judgment intact.    LABORATORY PANEL:   CBC  Recent Labs Lab 10/03/14 2320  WBC 17.1*  HGB 13.6  HCT 40.7  PLT 136*   ------------------------------------------------------------------------------------------------------------------  Chemistries   Recent Labs Lab 10/03/14 2320  NA 138  K 4.2  CL 107  CO2 23  GLUCOSE 118*  BUN 14  CREATININE 1.02  CALCIUM 8.9  AST 35  ALT 35  ALKPHOS 60  BILITOT 1.5*   ------------------------------------------------------------------------------------------------------------------  Cardiac Enzymes  Recent Labs Lab 10/03/14 2320  TROPONINI 0.08*   ------------------------------------------------------------------------------------------------------------------  RADIOLOGY:  Dg Chest Port 1 View  10/04/2014   CLINICAL DATA:  Sternal chest pain, onset yesterday and worsened today. Dyspnea.  EXAM: PORTABLE CHEST - 1 VIEW  COMPARISON:  10/13/2009  FINDINGS: Moderate vascular and interstitial congestive changes are present, new. There is mild ground-glass opacity in the central basilar regions which may represent alveolar edema. No large effusions are evident.  IMPRESSION: Probable congestive heart failure   Electronically Signed   By: Ellery Plunk M.D.   On: 10/04/2014 00:59    EKG:  No orders found for this or any previous visit.  IMPRESSION AND PLAN:   39 year old Caucasian gentleman without significant past history presented with chest pain shortness breath gradually worsening over three-day duration. Noted to be in complete heart block and emergent department.  1. NSTEMI: Admit to telemetry, start aspirin and statin therapy, trend cardiac enzymes 3, consult cardiology, check  echocardiogram 2. Complete heart block: Treatment plan as above, currently blood pressure stable will place pacer pads on patient, if condition worsens including blood pressure, mental status, repeat chest pain will pace and call cardiology for potential transvenous pacer. Given exam question if pericarditis as potential etiology 3. Venous thromboembolism prophylactic: Therapeutic Lovenox 1 mg/kg twice a day     All the records are reviewed and case discussed with ED provider. Management plans discussed with the patient, family and they are in agreement.  CODE STATUS: Full code  TOTAL TIME TAKING CARE OF THIS PATIENT: Critical care 45 minutes minutes.    Hower,  Mardi Mainland.D on 10/04/2014 at 2:40 AM  Between 7am to 6pm - Pager - [phone]  After 6pm: House Pager: - [phone]  [facility]  Office  [phone]  CC: Primary care physician; No PCP Per Patient

## 2014-10-04 NOTE — ED Notes (Signed)
Pt with first degree heart block noted at this time on cardiac monitor. Dr. Carollee Massed notified. Pt continues to states pain 9/10 to mid chest with inspiration or cough. md notified.

## 2014-10-04 NOTE — ED Notes (Signed)
Repeat ekg obtained and given to dr. Almira Bar.

## 2014-10-04 NOTE — Progress Notes (Addendum)
ANTICOAGULATION CONSULT NOTE - Initial Consult  Pharmacy Consult for Heparin Indication: NSTEMI  No Known Allergies  Patient Measurements:   Heparin Dosing Weight: 99.5  Vital Signs: Temp: 98.5 F (36.9 C) (05/08 0317) Temp Source: Oral (05/08 0317) BP: 127/62 mmHg (05/08 1000) Pulse Rate: 49 (05/08 1000)  Labs:  Recent Labs  10/03/14 2320 10/04/14 0650  HGB 13.6 13.1  HCT 40.7 39.7*  PLT 136* 139*  CREATININE 1.02 0.88  CKTOTAL  --  142  CKMB  --  2.7  TROPONINI 0.08* 0.06*    Estimated Creatinine Clearance: 143.4 mL/min (by C-G formula based on Cr of 0.88).   Medical History: History reviewed. No pertinent past medical history.  Medications:  Scheduled:  . [START ON 10/05/2014] aspirin EC  81 mg Oral Daily  . atorvastatin  80 mg Oral q1800  . sodium chloride  3 mL Intravenous Q12H   Infusions:    Assessment: 39 YOM from [facility] admitted 10/04/2014 presenting with palpitations, CP, and SOB.  While at [facility], patient was found to have 3rd degree heart block with fever.  Patient was emergently transferred to [facility] for cath.  Pharmacy has been consulted to initiate heparin in the setting of NSTEMI.  Scr 0.88, Crcl > 100 mL/min, Lytes wnl.    Hgb 13.1 ok, plt 139 slightly low.  No reports of bleeding at this time.  Goal of Therapy:  Heparin level 0.3-0.7 units/ml Monitor platelets by anticoagulation protocol: Yes   Plan:  - Heparin 4000 unit IV bolus x 1 - Heparin 1300 units/hr IV infusion - Heparin level 6 hours after start of infusion - Daily heparin level and CBC - Monitor for signs and symptoms of bleeding  Addendum  Initial heparin level is at goal (0.4) on 1300 units/hr. Will check confirmatory level tonight.  Sheppard Coil PharmD., BCPS Clinical Pharmacist Pager [phone] 10/04/2014 5:10 PM

## 2014-10-04 NOTE — ED Notes (Signed)
Pt and family updated on status of lab results. Pt tearful, states "i'm scared about what's happening". Pt provided with emotional reassurance. Family remains at bedside.

## 2014-10-04 NOTE — ED Notes (Signed)
Pt and family updated on progression of lab results. Pt verbalizes understanding. Pt remains tachypnic at rest with rate 22. Pt continues to complain of mid chest pain 9/10 with inspiration. md kaminiski notified.

## 2014-10-04 NOTE — Discharge Summary (Signed)
[facility] [place] at [facility]   PATIENT NAME: Nicholas Horton    MR#:  [id number]  DATE OF BIRTH:  Jul 03, 1975   DATE OF ADMISSION:  10/03/2014  PRIMARY CARE PHYSICIAN: No PCP Per Patient   REQUESTING/REFERRING PHYSICIAN: Carollee Massed  CHIEF COMPLAINT:   Chief Complaint  Patient presents with  . Pleurisy   chest pain, shortness of breath  HISTORY OF PRESENT ILLNESS:  Nicholas Horton  is a 39 y.o. male without prior medical history presented with chest pain/shortness of breath. History aided by family member at bedside. They described 2-3 day duration of cough, nonproductive with associated shortness of breath mainly dyspnea on exertion. He is now experiencing palpitations, chest pain retrosternal in location, nonradiating intensity 7-8 out of 10, pressure in quality, no worsening or relieving factors, this is been waxing and waning. Given worsening symptoms including worsening shortness of breath and decided to present to the hospital for further workup and evaluation. Upon arrival to the emergent department noted to be in third-degree heart block with heart rates in the 40s to 50s blood pressure stable.  PAST MEDICAL HISTORY:  History reviewed. No pertinent past medical history.  PAST SURGICAL HISTORY:  History reviewed. No pertinent past surgical history.  SOCIAL HISTORY:   History  Substance Use Topics  . Smoking status: Current Every Day Smoker    Types: Cigarettes  . Smokeless tobacco: Never Used  . Alcohol Use: Yes     Comment: rarely    FAMILY HISTORY:  History reviewed. No pertinent family history.  DRUG ALLERGIES:  No Known Allergies  REVIEW OF SYSTEMS:  REVIEW OF SYSTEMS:  CONSTITUTIONAL: Positive for subjective fevers, chills, fatigue, weakness.  EYES: Denies blurred vision, double vision, or eye pain.  EARS, NOSE, THROAT: Denies tinnitus, ear pain, hearing loss.  RESPIRATORY: Positive for nonproductive cough, shortness of  breath, denies wheezing  CARDIOVASCULAR: Positive chest pain, palpitations, denies edema. Denies orthopnea GASTROINTESTINAL: Denies nausea, vomiting, diarrhea, abdominal pain.  GENITOURINARY: Denies dysuria, hematuria.  ENDOCRINE: Denies nocturia or thyroid problems. HEMATOLOGIC AND LYMPHATIC: Denies easy bruising or bleeding.  SKIN: Denies rash or lesions.  MUSCULOSKELETAL: Denies pain in neck, back, shoulder, knees, hips, or further arthritic symptoms.  NEUROLOGIC: Denies paralysis, paresthesias.  PSYCHIATRIC: Denies anxiety or depressive symptoms. Otherwise full review of systems performed by me is negative.   MEDICATIONS AT HOME:   Prior to Admission medications   Not on File      VITAL SIGNS:  Blood pressure 110/53, pulse 70, temperature 98.5 F (36.9 C), temperature source Oral, resp. rate 32, height 6\' 2"  (1.88 m), weight 99.5 kg (219 lb 5.7 oz), SpO2 88 %.  PHYSICAL EXAMINATION:  VITAL SIGNS: Filed Vitals:   10/04/14 0611  BP:   Pulse: 70  Temp:   Resp: 32   GENERAL:39 y.o.male ill-appearing HEAD: Normocephalic, atraumatic.  EYES: Pupils equal, round, reactive to light. Extraocular muscles intact. No scleral icterus.  MOUTH: Moist mucosal membrane. Dentition poor. No abscess noted.  EAR, NOSE, THROAT: Clear without exudates. No external lesions.  NECK: Supple. No thyromegaly. No nodules. No JVD.  PULMONARY: Clear to ascultation, without wheeze rails or rhonci. Tachypnea without use of accessory muscles, Good respiratory effort. good air entry bilaterally CHEST: Nontender to palpation.  CARDIOVASCULAR: S1 and S2. Regular rate and rhythm. Positive friction rub. Trace edema. Pedal pulses 2+ bilaterally.  GASTROINTESTINAL: Soft, nontender, nondistended. No masses. Positive bowel sounds. No hepatosplenomegaly.  MUSCULOSKELETAL: No swelling, clubbing, or edema. Range of motion full  in all extremities.  NEUROLOGIC: Cranial nerves II through XII are intact. No gross focal  neurological deficits. Sensation intact. Reflexes intact.  SKIN: No ulceration, lesions, rashes, or cyanosis. Skin warm and dry. Turgor intact.  PSYCHIATRIC: Mood, affect within normal limits. The patient is awake, alert and oriented x 3. Insight, judgment intact.    LABORATORY PANEL:   CBC  Recent Labs Lab 10/03/14 2320  WBC 17.1*  HGB 13.6  HCT 40.7  PLT 136*   ------------------------------------------------------------------------------------------------------------------  Chemistries   Recent Labs Lab 10/03/14 2320 10/04/14 0053  NA 138  --   K 4.2  --   CL 107  --   CO2 23  --   GLUCOSE 118*  --   BUN 14  --   CREATININE 1.02  --   CALCIUM 8.9  --   MG  --  1.8  AST 35  --   ALT 35  --   ALKPHOS 60  --   BILITOT 1.5*  --    ------------------------------------------------------------------------------------------------------------------  Cardiac Enzymes  Recent Labs Lab 10/03/14 2320  TROPONINI 0.08*   ------------------------------------------------------------------------------------------------------------------  RADIOLOGY:  Dg Chest Port 1 View  10/04/2014   CLINICAL DATA:  Sternal chest pain, onset yesterday and worsened today. Dyspnea.  EXAM: PORTABLE CHEST - 1 VIEW  COMPARISON:  10/13/2009  FINDINGS: Moderate vascular and interstitial congestive changes are present, new. There is mild ground-glass opacity in the central basilar regions which may represent alveolar edema. No large effusions are evident.  IMPRESSION: Probable congestive heart failure   Electronically Signed   By: Ellery Plunk M.D.   On: 10/04/2014 00:59    EKG:  No orders found for this or any previous visit.  IMPRESSION AND PLAN:   39 year old Caucasian gentleman without significant past history presented with chest pain shortness breath gradually worsening over three-day duration. Noted to be in complete heart block and emergent department.  1. NSTEMI: Admit to telemetry,  start aspirin and statin therapy, trend cardiac enzymes 3, consult cardiology, check echocardiogram 2. Complete heart block: Treatment plan as above, currently blood pressure stable will place pacer pads on patient, if condition worsens including blood pressure, mental status, repeat chest pain will pace and call cardiology for potential transvenous pacer. Given exam question if pericarditis as potential etiology 3. Venous thromboembolism prophylactic: Therapeutic Lovenox 1 mg/kg twice a day  Addendum 1. NSTEMI-- admitted 1 hr ago. Patient with worsening chest pain and SOB; 3rd degree block with junctional escape rhythms.  Given nitroglycerin and morphine.  STAT cardiac enzymes ordered.  D/w transfer center.  Dr. Herbie Baltimore accepting.  Pt to go to cath lab immediately.  Transport with transcutaneous pacer pads in place.  Rate controlled for now.    All the records are reviewed and case discussed with ED provider. Management plans discussed with the patient, family and they are in agreement.  CODE STATUS: Full code  TOTAL TIME TAKING CARE OF THIS PATIENT: Critical care 45 minutes minutes.    Alcide Evener.D on 10/04/2014 at 6:31 AM  Between 7am to 6pm - Pager - [phone]  After 6pm: House Pager: - [phone]  [facility]  Office  [phone]  CC: Primary care physician; No PCP Per Patient

## 2014-10-04 NOTE — ED Notes (Signed)
Pt updated regarding admission process. Pt verbalizes understanding.  

## 2014-10-05 ENCOUNTER — Encounter (HOSPITAL_COMMUNITY): Payer: Self-pay | Admitting: Cardiology

## 2014-10-05 ENCOUNTER — Inpatient Hospital Stay (HOSPITAL_COMMUNITY): Payer: Self-pay

## 2014-10-05 DIAGNOSIS — I350 Nonrheumatic aortic (valve) stenosis: Secondary | ICD-10-CM

## 2014-10-05 DIAGNOSIS — I5021 Acute systolic (congestive) heart failure: Secondary | ICD-10-CM

## 2014-10-05 DIAGNOSIS — R7989 Other specified abnormal findings of blood chemistry: Secondary | ICD-10-CM

## 2014-10-05 DIAGNOSIS — R0602 Shortness of breath: Secondary | ICD-10-CM

## 2014-10-05 DIAGNOSIS — I429 Cardiomyopathy, unspecified: Secondary | ICD-10-CM

## 2014-10-05 LAB — TROPONIN I: Troponin I: 0.19 ng/mL — ABNORMAL HIGH (ref <0.031)

## 2014-10-05 LAB — LIPID PANEL
CHOLESTEROL: 198 mg/dL (ref 0–200)
HDL: 30 mg/dL — ABNORMAL LOW (ref >40)
LDL Cholesterol: 135 mg/dL — ABNORMAL HIGH (ref 0–99)
TRIGLYCERIDES: 165 mg/dL — AB (ref <150)
Total CHOL/HDL Ratio: 6.6 RATIO
VLDL: 33 mg/dL (ref 0–40)

## 2014-10-05 LAB — HEPARIN LEVEL (UNFRACTIONATED)
HEPARIN UNFRACTIONATED: 0.15 [IU]/mL — AB (ref 0.30–0.70)
Heparin Unfractionated: 0.16 IU/mL — ABNORMAL LOW (ref 0.30–0.70)

## 2014-10-05 LAB — CBC
HCT: 39.6 % (ref 39.0–52.0)
HEMOGLOBIN: 13.2 g/dL (ref 13.0–17.0)
MCH: 30.9 pg (ref 26.0–34.0)
MCHC: 33.3 g/dL (ref 30.0–36.0)
MCV: 92.7 fL (ref 78.0–100.0)
Platelets: 127 10*3/uL — ABNORMAL LOW (ref 150–400)
RBC: 4.27 MIL/uL (ref 4.22–5.81)
RDW: 13.1 % (ref 11.5–15.5)
WBC: 13.3 10*3/uL — ABNORMAL HIGH (ref 4.0–10.5)

## 2014-10-05 LAB — COMPREHENSIVE METABOLIC PANEL
ALBUMIN: 3.3 g/dL — AB (ref 3.5–5.0)
ALK PHOS: 56 U/L (ref 38–126)
ALT: 28 U/L (ref 17–63)
AST: 23 U/L (ref 15–41)
Anion gap: 7 (ref 5–15)
BUN: 14 mg/dL (ref 6–20)
CALCIUM: 8.6 mg/dL — AB (ref 8.9–10.3)
CO2: 25 mmol/L (ref 22–32)
Chloride: 106 mmol/L (ref 101–111)
Creatinine, Ser: 0.92 mg/dL (ref 0.61–1.24)
GFR calc non Af Amer: >60 mL/min (ref >60)
GLUCOSE: 109 mg/dL — AB (ref 70–99)
Potassium: 3.8 mmol/L (ref 3.5–5.1)
SODIUM: 138 mmol/L (ref 135–145)
TOTAL PROTEIN: 7.2 g/dL (ref 6.5–8.1)
Total Bilirubin: 2.3 mg/dL — ABNORMAL HIGH (ref 0.3–1.2)

## 2014-10-05 LAB — TRANSFERRIN: Transferrin: 206 mg/dL (ref 180–329)

## 2014-10-05 LAB — HEMOGLOBIN A1C
HEMOGLOBIN A1C: 5.6 % (ref 4.8–5.6)
Mean Plasma Glucose: 114 mg/dL

## 2014-10-05 MED ORDER — GADOBENATE DIMEGLUMINE 529 MG/ML IV SOLN
33.0000 mL | Freq: Once | INTRAVENOUS | Status: AC | PRN
  Administered 2014-10-05: 33 mL via INTRAVENOUS

## 2014-10-05 MED ORDER — FUROSEMIDE 10 MG/ML IJ SOLN
40.0000 mg | Freq: Every day | INTRAMUSCULAR | Status: DC
  Administered 2014-10-05 – 2014-10-06 (×2): 40 mg via INTRAVENOUS
  Filled 2014-10-05 (×3): qty 4

## 2014-10-05 MED ORDER — HEPARIN BOLUS VIA INFUSION
3000.0000 [IU] | Freq: Once | INTRAVENOUS | Status: AC
  Administered 2014-10-05: 3000 [IU] via INTRAVENOUS
  Filled 2014-10-05: qty 3000

## 2014-10-05 MED ORDER — HEPARIN SODIUM (PORCINE) 5000 UNIT/ML IJ SOLN
5000.0000 [IU] | Freq: Three times a day (TID) | INTRAMUSCULAR | Status: DC
  Administered 2014-10-05 – 2014-10-07 (×7): 5000 [IU] via SUBCUTANEOUS
  Filled 2014-10-05 (×8): qty 1

## 2014-10-05 MED FILL — Lidocaine HCl Local Preservative Free (PF) Inj 1%: INTRAMUSCULAR | Qty: 30 | Status: AC

## 2014-10-05 MED FILL — Heparin Sodium (Porcine) 2 Unit/ML in Sodium Chloride 0.9%: INTRAMUSCULAR | Qty: 1000 | Status: AC

## 2014-10-05 NOTE — Progress Notes (Signed)
Right femoral venous sheath/TVP removed without difficulty. Patient with no complaints. Groin level 0; pressure held 15 minutes.

## 2014-10-05 NOTE — Progress Notes (Addendum)
CARDIOLOGY SERVICE  Subjective: Patient overall feels better- breathing has somewhat  improved, also denies chest pain, and occasionally coughs. But having drenching sweats which he says is unsual for him. Denies family history of heart problems, also said everything came on at once on Friday- 10/02/2014, on Saturday- the next day things got worse and so he decided to seek care. He denies feeling SOB prior to Friday even with exertion. Also denies leg or abdominal swelling. Denies alcohol use or use of recreational drugs. He has otherwise been in good health.    Objective: Vital signs in last 24 hours: Filed Vitals:   10/05/14 0300 10/05/14 0400 10/05/14 0500 10/05/14 0600  BP: 127/65 119/65 112/63 108/64  Pulse: 72 67 55 60  Temp:  99 F (37.2 C)    TempSrc:  Oral    Resp: Height:      Weight:   218 lb 7.6 oz (99.1 kg)   SpO2: 95% 95% 97% 96%   Weight change:   Intake/Output Summary (Last 24 hours) at 10/05/14 0741 Last data filed at 10/05/14 0600  Gross per 24 hour  Intake 1624.62 ml  Output   3850 ml  Net -2225.38 ml   Physical Exam-  General appearance: alert, cooperative, appears stated age and no distress Lungs: clear to auscultation bilaterally- Limited exam- patient lying on back after cardiac cath. Heart: regular rate and rhythm, 3/6 systolic murmur heard in all area but appears loudest aortic area.  Abdomen: soft, non-tender; bowel sounds normal; no masses,  no organomegaly Extremities: extremities normal, atraumatic, no cyanosis or edema, pulses present- better on right. Skin: Skin color, texture, turgor normal. No rashes or lesions.  Tele- Intermittent ventricular pacing, third degree heart block.   Lab Results: Basic Metabolic Panel:  Recent Labs Lab 10/04/14 0053 10/04/14 0650 10/05/14 0233  NA  --  141 138  K  --  4.1 3.8  CL  --  110 106  CO2  --  23 25  GLUCOSE  --  110* 109*  BUN  --  13 14  CREATININE  --  0.88 0.92  CALCIUM  --  8.5*  8.6*  MG 1.8  --   --    Liver Function Tests:  Recent Labs Lab 10/04/14 0650 10/05/14 0233  AST 27 23  ALT 31 28  ALKPHOS 65 56  BILITOT 2.2* 2.3*  PROT 7.1 7.2  ALBUMIN 3.7 3.3*    Recent Labs Lab 10/03/14 2320  LIPASE 24   CBC:  Recent Labs Lab 10/03/14 2320 10/04/14 0650 10/05/14 0233  WBC 17.1* 16.0* 13.3*  NEUTROABS 14.1* 11.8*  --   HGB 13.6 13.1 13.2  HCT 40.7 39.7* 39.6  MCV 92.3 92.5 92.7  PLT 136* 139* 127*   Cardiac Enzymes:  Recent Labs Lab 10/04/14 0650 10/04/14 1120 10/04/14 1602 10/04/14 2209  CKTOTAL 142  --   --   --   CKMB 2.7  --   --   --   TROPONINI 0.06* 0.08* 0.14* 0.19*   Fasting Lipid Panel:  Recent Labs Lab 10/05/14 0233  CHOL 198  HDL 30*  LDLCALC 135*  TRIG 165*  CHOLHDL 6.6   Thyroid Function Tests:  Recent Labs Lab 10/04/14 1120  TSH 1.917   Urinalysis:  Recent Labs Lab 10/04/14 0020 10/04/14 1030  COLORURINE YELLOW* YELLOW  LABSPEC 1.009 1.015  PHURINE 7.0 5.5  GLUCOSEU NEGATIVE NEGATIVE  HGBUR NEGATIVE NEGATIVE  BILIRUBINUR NEGATIVE NEGATIVE  KETONESUR NEGATIVE  NEGATIVE  PROTEINUR NEGATIVE NEGATIVE  UROBILINOGEN  --  0.2  NITRITE NEGATIVE NEGATIVE  LEUKOCYTESUR TRACE* NEGATIVE    Studies/Results: Dg Chest Port 1 View  10/04/2014   CLINICAL DATA:  Sternal chest pain, onset yesterday and worsened today. Dyspnea.  EXAM: PORTABLE CHEST - 1 VIEW  COMPARISON:  10/13/2009  FINDINGS: Moderate vascular and interstitial congestive changes are present, new. There is mild ground-glass opacity in the central basilar regions which may represent alveolar edema. No large effusions are evident.  IMPRESSION: Probable congestive heart failure   Electronically Signed   By: Ellery Plunk M.D.   On: 10/04/2014 00:59   Medications: I have reviewed the patient's current medications. Scheduled Meds: . aspirin EC  81 mg Oral Daily  . atorvastatin  80 mg Oral q1800  . pneumococcal 23 valent vaccine  0.5 mL  Intramuscular Tomorrow-1000  . sodium chloride  3 mL Intravenous Q12H   Continuous Infusions: . sodium chloride 10 mL/hr at 10/05/14 0127  . heparin 1,600 Units/hr (10/05/14 0126)   PRN Meds:.sodium chloride, acetaminophen, ALPRAZolam, nitroGLYCERIN, ondansetron (ZOFRAN) IV, sodium chloride, zolpidem Assessment/Plan: Principal Problem:   Third degree heart block Active Problems:   NSTEMI, initial episode of care  CHF- Systolic and diastolic- On Cardiac cath.  Plan- - CHF- Considering sudden onset symptoms of fever, cough and SOB, likely initial Viral infection- resultant viral Cardiomyopathy/Myocarditis- all left sided heart failure without signs of peripheral edema. Differentials include- Lymes disease (Not in endemic region), sarcoidosis, amyloidosis, thyroid disorders- TSH- normal 1.9, hemochromatosis. - Check- Transferrin, HIV, UPEP, SPEP,   -  Await Echo - D/c heparin infusion as minimal disease on cath - and switch to DVT PPx - Cont Lipitor and ASA - Start Lasix 40mg  daily - Daily Bmets and weights. - Consult EP - Consider starting ACE-i when BP will tolerate. - No BB due to heart block.  Dispo: Disposition is deferred at this time, awaiting improvement of current medical problems.     LOS: 1 day   Onnie Boer, MD 10/05/2014, 7:41 AM   I have examined the patient and reviewed assessment and plan and discussed with patient.  Agree with above as stated.  Sudden onset of cardiac sx in the absence of flow limiting CAD.  Likely nonischemic cardiomyopathy.  Checking iron studies and SPEP.  TSH normal.  ? Viral?  No alcohol or drug use per his report.  Lasix today.  Check with EP if there are any other reversible causes that we should look for.  Lyme Disease would be a possibility.  Hopefully, the complete heart block will be transient.    Reviewed tele at length.  No evidence of tachycardia or AFib, although machine called these abnormalities.    Stop heparin given no  CAD.    Theran Vandergrift S.

## 2014-10-05 NOTE — Consult Note (Signed)
CARDIOLOGY CONSULT NOTE   Patient ID: Nicholas Horton MRN: [id number] DOB/AGE: 09/22/1975 39 y.o.  Admit date: 10/04/2014  Primary Physician   No PCP Per Patient Primary Cardiologist   [place] Reason for Consultation   CHB  HPI: Nicholas Horton is a 39 y.o. male without significant past history who presented to [facility] on 10/03/14 with chest pain shortness breath gradually worsening over three-day duration. Noted to be in complete heart block and NSTEMI in ED and transferred to [facility] for LHC and further work up.  He was in his usual state of health until 10/03/14 when he started to feel ill. He went to [facility] 10/03/2014 with a complaint of roughly 3 day history of chest pain and SOB. This was associated with cough, orthopnea and PND. He also has noticed palpitations and severe chest pain/pressure. Upon arrival at [facility], he was in 3rd degree heart block, heart rates in the 40s and 50s in a junctional rhythm. His symptoms did not improve and his troponin was elevated. Of note he had a low-grade fever of 100.7. Because of the 3rd degree heart block and the elevated enzymes as well as ongoing chest pain, he was transferred emergently to [facility] and taken directly to the cath lab. Dr Herbie Baltimore performed a LHC on 10/04/14 which revealed angiographically minimal CAD. Positive troponin levels felt to be related to acute heart failure with severely elevated EDP given no significant CAD. LHC also revealed a dilated cardiomyopathy with severely reduced EF of roughly 20-25%; global hypokinesis and elevated EDP of 30-40 MmHg as well as mod to severe aortic stenosis.  EP has been consulted for further insight into his CHB. He has temporary PM placed. He currently is in 1st deg AV block HR in 70s. Per nursing report he transiently went into CHB over night requiring intermittent pacing. He is feeling okay today. No further CP, SOB, lightheadedness/dizziness, PND or orthopnea. Denies alcohol use or use of recreational  drugs. He has otherwise been in good health. No recent travel, hiking or camping where he could have been bit by a tick.     History reviewed. No pertinent past medical history.   Past Surgical History  Procedure Laterality Date  . Surgery scrotal / testicular      for undescended testicle as a child  . Cardiac catheterization N/A 10/04/2014    Procedure: Left Heart Cath and Coronary Angiography;  Surgeon: Marykay Lex, MD;  Location: [facility] INVASIVE CV LAB;  Service: Cardiovascular;  Laterality: N/A;  . Cardiac catheterization  10/04/2014    Procedure: Temporary Pacemaker;  Surgeon: Marykay Lex, MD;  Location: [facility] INVASIVE CV LAB;  Service: Cardiovascular;;    No Known Allergies  I have reviewed the patient's current medications . aspirin EC  81 mg Oral Daily  . atorvastatin  80 mg Oral q1800  . furosemide  40 mg Intravenous Daily  . heparin subcutaneous  5,000 Units Subcutaneous 3 times per day  . pneumococcal 23 valent vaccine  0.5 mL Intramuscular Tomorrow-1000  . sodium chloride  3 mL Intravenous Q12H   . sodium chloride 10 mL/hr at 10/05/14 0127   sodium chloride, acetaminophen, ALPRAZolam, nitroGLYCERIN, ondansetron (ZOFRAN) IV, sodium chloride, zolpidem  Prior to Admission medications   Not on File     History   Social History  . Marital Status: Single    Spouse Name: N/A  . Number of Children: N/A  . Years of Education: N/A   Occupational History  .  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    Social History Main Topics  . Smoking status: Current Every Day Smoker -- 0.50 packs/day for 20 years    Types: Cigarettes  . Smokeless tobacco: Never Used  . Alcohol Use: 0.0 oz/week    0 Standard drinks or equivalent per week     Comment: rarely  . Drug Use: No  . Sexual Activity: Yes   Other Topics Concern  . Not on file   Social History Narrative   Lives in [place], [place] with fiance. No history of CAD or PPM, arrhythmia in parents or siblings.    Family Status    Relation Status Death Age  . Mother Alive   . Father Alive    History reviewed. No pertinent family history.   ROS:  Full 14 point review of systems complete and found to be negative unless listed above.  Physical Exam: Blood pressure 119/51, pulse 73, temperature 98 F (36.7 C), temperature source Oral, resp. rate 25, height  (1.88 m), weight 218 lb 7.6 oz (99.1 kg), SpO2 95 %.  General: Well developed, well nourished, male in no acute distress Head: Eyes PERRLA, No xanthomas.   Normocephalic and atraumatic, oropharynx without edema or exudate. Dentition: poor Lungs: CTAB Heart: HRRR S1 S2, no rub/gallop, Heart irregular rate and rhythm with S1, S2 . pulses are 2+ extrem. 3/6 systolic murmur heard in all area but appears loudest aortic area.   Neck: No carotid bruits. No lymphadenopathy.  NoJVD. Abdomen: Bowel sounds present, abdomen soft and non-tender without masses or hernias noted. Msk:  No spine or cva tenderness. No weakness, no joint deformities or effusions. Extremities: No clubbing or cyanosis. No edema.  Neuro: Alert and oriented X 3. No focal deficits noted. Psych:  Good affect, responds appropriately Skin: No rashes or lesions noted.  Labs:   Lab Results  Component Value Date   WBC 13.3* 10/05/2014   HGB 13.2 10/05/2014   HCT 39.6 10/05/2014   MCV 92.7 10/05/2014   PLT 127* 10/05/2014   No results for input(s): INR in the last 72 hours.  Recent Labs Lab 10/05/14 0233  NA 138  K 3.8  CL 106  CO2 25  BUN 14  CREATININE 0.92  CALCIUM 8.6*  PROT 7.2  BILITOT 2.3*  ALKPHOS 56  ALT 28  AST 23  GLUCOSE 109*  ALBUMIN 3.3*   MAGNESIUM  Date Value Ref Range Status  10/04/2014 1.8 1.7 - 2.4 mg/dL Final    Recent Labs  16/10/96 0650 10/04/14 1120 10/04/14 1602 10/04/14 2209  CKTOTAL 142  --   --   --   CKMB 2.7  --   --   --   TROPONINI 0.06* 0.08* 0.14* 0.19*    Lab Results  Component Value Date   CHOL 198 10/05/2014   HDL 30* 10/05/2014    LDLCALC 135* 10/05/2014   TRIG 165* 10/05/2014    LIPASE  Date/Time Value Ref Range Status  10/03/2014 11:20 PM 24 22 - 51 U/L Final   TSH  Date/Time Value Ref Range Status  10/04/2014 11:20 AM 1.917 0.350 - 4.500 uIU/mL Final    LHC: 10/04/14 Conclusion    1. Angiographically minimal CAD 2. Dilated cardiomyopathy with severely reduced EF of roughly 20-25%; global hypokinesis 3. Moderate if not Severe Aortic Stenosis 4. Acute Combined Systolic and Diastolic Heart Failure with Elevated EDP of 30-40 MmHg 5. Third-Degree AV block with accelerated junctional escape beats/rhythm (rates of roughly 40-70 bpm) 6.  Successful Temporary Transient Replacement with backup rate of 47 bpm  39 year old woman presenting with complete heart block and acute combined systolic and diastolic heart failure found to have severe abnormality cardiomyopathy with global hypokinesis and at least moderate aortic stenosis with elevated EDP.  He was hypoxic on arrival with saturation of 79% by ABG. He was placed on nonrebreather mask with excellent oxygenation. He received 40 of IV Lasix in the cardiac catheterization lab and had 750 mils out prior to leaving the Cath Lab.  Recommendations:  Admit to CCU for management of acute heart failure with temporary pacemaker.  IV Lasix diuresis  Check 2-D echocardiogram to confirm EF and have a full assessment of the aortic valve. He may require dobutamine challenge to better assess true gradient across the valve with low output aortic stenosis.  Temperature pacemaker has a backup rate of 40 bpm. Patient is very poor dentition. If he will need of permit a pacemaker would likely need a dental consult in the morning.  Positive troponin levels are likely related to acute heart failure with severely elevated EDP given no significant CAD.  Patient blood pressure is not adequate enough to consider afterload reduction at this point.  Obviously no beta blockers based on  the degree AV block.  The patient will need electrophysiology consultation and likely advanced heart failure consultation.       Echo: pending   Radiology:  Dg Chest Port 1 View  10/04/2014   CLINICAL DATA:  Sternal chest pain, onset yesterday and worsened today. Dyspnea.  EXAM: PORTABLE CHEST - 1 VIEW  COMPARISON:  10/13/2009  FINDINGS: Moderate vascular and interstitial congestive changes are present, new. There is mild ground-glass opacity in the central basilar regions which may represent alveolar edema. No large effusions are evident.  IMPRESSION: Probable congestive heart failure   Electronically Signed   By: Ellery Plunk M.D.   On: 10/04/2014 00:59    ASSESSMENT AND PLAN:    Principal Problem:   Third degree heart block Active Problems:   NSTEMI, initial episode of care  Nicholas Horton is a 39 y.o. male without significant past history who presented to [facility] on 10/03/14 with chest pain shortness breath gradually worsening over three-day duration. Noted to be in complete heart block and NSTEMI in ED and transferred to [facility] for LHC and further work up. EP has been consulted for further insight into his CHB. He has temporary PM placed. He currently is in 1st deg AV block HR in 70s. Per nursing report he transiently went into CHB over night requiring intermittent pacing. He is feeling okay today.  Third-Degree AV block with accelerated junctional escape beats/rhythm (rates of roughly 40-70 bpm) -- s/p successful temporary pacemaker placement w/ backup rate of 40 bpm -- Per Dr. Herbie Baltimore, patient is very poor dentition. If he will need of permit a pacemaker would likely need a dental consult in the morning. -- EP consulted to think about other potential reversible causes. Lyme Disease,myocarditis, amyloidosis, sarcoidosis should be ruled out. Dr. Ladona Ridgel to follow. We will have temp PM removed and have ordered a cardiac MR to assess myocardium that will be done today NSTEMI s/p LHC with  angiographically minimal CAD. Positive troponin levels are likely related to acute heart failure with severely elevated EDP given no significant CAD.  Dilated cardiomyopathy with severely reduced EF of roughly 20-25%; global hypokinesis -- Considering sudden onset symptoms of fever, cough and SOB, likely initial Viral infection- resultant viral Cardiomyopathy/Myocarditis- all left sided heart  failure without signs of peripheral edema. Differentials include- Lymes disease (Not in endemic region), sarcoidosis, amyloidosis, thyroid disorders- TSH- normal 1.9, hemochromatosis. -- 2D ECHO pending.  Acute Combined S/D CHF with Elevated EDP of 30-40 MmHg -- Continue IV lasix. No BB with bradycardia. -- 2D ECHO pending.   Aortic Stenosis- mod to severe by LHC. -- 2D ECHO pending. He may require dobutamine challenge to better assess true gradient across the valve with low output aortic stenosis.    SignedAllena Katz 10/05/2014 9:57 AM  Pager [phone]  EP Attending  Patient seen and examined. Agree with the history, exam, data and assessment as documented. He has intermittent CHB, aortic stenosis (probably not surgical, ?bicuspid valve) and severe LV dysfunction. He has only been sick a few days raising the question of acute myocarditis. He is conducting 1:1 currently and his temp wire will be removed. I have recommended a cardiac MRI. A 2D echo will help [place] evaluate the severity of the AS. Hopefully we can hold off on PPM as his escape rhythm is not slow. His prognosis is guarded but dependent on his underlying disease.  Leonia Reeves.D.

## 2014-10-05 NOTE — Progress Notes (Signed)
ANTICOAGULATION CONSULT NOTE - Follow Up Consult  Pharmacy Consult for heparin Indication: NSTEMI   Labs:  Recent Labs  10/03/14 2320 10/04/14 0650 10/04/14 1120 10/04/14 1602 10/04/14 2209 10/04/14 2300  HGB 13.6 13.1  --   --   --   --   HCT 40.7 39.7*  --   --   --   --   PLT 136* 139*  --   --   --   --   HEPARINUNFRC  --   --   --  0.41  --  0.16*  CREATININE 1.02 0.88  --   --   --   --   CKTOTAL  --  142  --   --   --   --   CKMB  --  2.7  --   --   --   --   TROPONINI 0.08* 0.06* 0.08* 0.14* 0.19*  --      Assessment: 39yo male now subtherapeutic on heparin after one level at goal, was likely still being affected by the LMWH pt rec'd earlier.  Goal of Therapy:  Heparin level 0.3-0.7 units/ml Monitor platelets by anticoagulation protocol: Yes   Plan:  Will rebolus with heparin 3000 units and increase gtt by 3 units/kg/hr to 1600 units/hr and check level in 6hr.  Vernard Gambles, PharmD, BCPS  10/05/2014,1:20 AM

## 2014-10-06 DIAGNOSIS — I5041 Acute combined systolic (congestive) and diastolic (congestive) heart failure: Secondary | ICD-10-CM

## 2014-10-06 DIAGNOSIS — I350 Nonrheumatic aortic (valve) stenosis: Secondary | ICD-10-CM

## 2014-10-06 LAB — UIFE/LIGHT CHAINS/TP QN, 24-HR UR
ALPHA 1 UR: 3.6 %
Albumin, U: 38.9 %
Alpha 2, Urine: 13.8 %
BETA UR: 25.7 %
Free Kappa/Lambda Ratio: 11 — ABNORMAL HIGH (ref 2.04–10.37)
Free Lambda Lt Chains,Ur: 6.21 mg/L (ref 0.24–6.66)
Free Lt Chn Excr Rate: 68.3 mg/L — ABNORMAL HIGH (ref 1.35–24.19)
GAMMA UR: 18.1 %
TOTAL PROTEIN, URINE-UPE24: 24.7 mg/dL

## 2014-10-06 LAB — PROTEIN ELECTROPHORESIS, SERUM
A/G Ratio: 1 (ref 0.7–2.0)
ALPHA-1-GLOBULIN: 0.4 g/dL (ref 0.1–0.4)
Albumin ELP: 3 g/dL — ABNORMAL LOW (ref 3.2–5.6)
Alpha-2-Globulin: 0.8 g/dL (ref 0.4–1.2)
Beta Globulin: 1 g/dL (ref 0.6–1.3)
GAMMA GLOBULIN: 1 g/dL (ref 0.5–1.6)
Globulin, Total: 3.1 g/dL (ref 2.0–4.5)
Total Protein ELP: 6.1 g/dL (ref 6.0–8.5)

## 2014-10-06 LAB — CBC
HCT: 42.3 % (ref 39.0–52.0)
Hemoglobin: 14.7 g/dL (ref 13.0–17.0)
MCH: 31.9 pg (ref 26.0–34.0)
MCHC: 34.8 g/dL (ref 30.0–36.0)
MCV: 91.8 fL (ref 78.0–100.0)
Platelets: 139 10*3/uL — ABNORMAL LOW (ref 150–400)
RBC: 4.61 MIL/uL (ref 4.22–5.81)
RDW: 12.8 % (ref 11.5–15.5)
WBC: 12 10*3/uL — ABNORMAL HIGH (ref 4.0–10.5)

## 2014-10-06 LAB — URINE CULTURE

## 2014-10-06 LAB — BASIC METABOLIC PANEL
Anion gap: 12 (ref 5–15)
BUN: 17 mg/dL (ref 6–20)
CALCIUM: 8.9 mg/dL (ref 8.9–10.3)
CO2: 21 mmol/L — ABNORMAL LOW (ref 22–32)
Chloride: 106 mmol/L (ref 101–111)
Creatinine, Ser: 0.79 mg/dL (ref 0.61–1.24)
GFR calc Af Amer: >60 mL/min (ref >60)
GFR calc non Af Amer: >60 mL/min (ref >60)
GLUCOSE: 103 mg/dL — AB (ref 70–99)
POTASSIUM: 3.9 mmol/L (ref 3.5–5.1)
Sodium: 139 mmol/L (ref 135–145)

## 2014-10-06 LAB — HIV ANTIBODY (ROUTINE TESTING W REFLEX): HIV Screen 4th Generation wRfx: NONREACTIVE

## 2014-10-06 MED ORDER — LISINOPRIL 5 MG PO TABS
5.0000 mg | ORAL_TABLET | Freq: Every day | ORAL | Status: DC
  Administered 2014-10-06 – 2014-10-07 (×2): 5 mg via ORAL
  Filled 2014-10-06 (×2): qty 1

## 2014-10-06 NOTE — Progress Notes (Signed)
Utilization review completed. Deandrew Hoecker, RN, BSN. 

## 2014-10-06 NOTE — Progress Notes (Signed)
CARDIOLOGY SERVICE  Subjective: Significant urine out put overnight- now net negative 3.5L since admission.  Breathing has significantly improved. Also no chest pain. Had some pain in his left knee and left shoulder after been in the MRI but that has improved.    Objective: Vital signs in last 24 hours: Filed Vitals:   10/06/14 0400 10/06/14 0500 10/06/14 0600 10/06/14 0700  BP: 114/66 115/68 118/56 119/63  Pulse: 63 69 59 65  Temp: 98.3 F (36.8 C)     TempSrc: Oral     Resp: 17 17 17 17   Height:      Weight: 218 lb 0.6 oz (98.9 kg)     SpO2: 92% 92% 92% 96%   Weight change: -1 lb 15.8 oz (-0.9 kg)  Intake/Output Summary (Last 24 hours) at 10/06/14 0743 Last data filed at 10/06/14 0600  Gross per 24 hour  Intake    836 ml  Output   2225 ml  Net  -1389 ml   Physical Exam-  General appearance: alert, cooperative, appears stated age and no distress Lungs: HAs some mild crackles present basilar regions Heart: regular rate and rhythm, persistent 3/6 systolic murmur heard in all area but appears loudest aortic area, MRI shows Bicuspid aortic valves.  Abdomen: soft, non-tender; bowel sounds normal; no masses,  no organomegaly Extremities: extremities normal, atraumatic, no cyanosis or edema, pulses present- better on right. Skin: Skin color, texture, turgor normal. No rashes or lesions.  Tele- No pacing through pacer observed, Pacer taken out yesterday for cardiac MRI, rate- 60s- 70s, occasional drop to 49. Mostly sinus rhythm, with occasional dropped QRS after P waves.    Lab Results: Basic Metabolic Panel:  Recent Labs Lab 10/04/14 0053  10/05/14 0233 10/06/14 0343  NA  --   < > 138 139  K  --   < > 3.8 3.9  CL  --   < > 106 106  CO2  --   < > 25 21*  GLUCOSE  --   < > 109* 103*  BUN  --   < > 14 17  CREATININE  --   < > 0.92 0.79  CALCIUM  --   < > 8.6* 8.9  MG 1.8  --   --   --   < > = values in this interval not displayed. Liver Function Tests:  Recent  Labs Lab 10/04/14 0650 10/05/14 0233  AST 27 23  ALT 31 28  ALKPHOS 65 56  BILITOT 2.2* 2.3*  PROT 7.1 7.2  ALBUMIN 3.7 3.3*    Recent Labs Lab 10/03/14 2320  LIPASE 24   CBC:  Recent Labs Lab 10/03/14 2320 10/04/14 0650 10/05/14 0233 10/06/14 0343  WBC 17.1* 16.0* 13.3* 12.0*  NEUTROABS 14.1* 11.8*  --   --   HGB 13.6 13.1 13.2 14.7  HCT 40.7 39.7* 39.6 42.3  MCV 92.3 92.5 92.7 91.8  PLT 136* 139* 127* 139*   Cardiac Enzymes:  Recent Labs Lab 10/04/14 0650 10/04/14 1120 10/04/14 1602 10/04/14 2209  CKTOTAL 142  --   --   --   CKMB 2.7  --   --   --   TROPONINI 0.06* 0.08* 0.14* 0.19*   Fasting Lipid Panel:  Recent Labs Lab 10/05/14 0233  CHOL 198  HDL 30*  LDLCALC 135*  TRIG 165*  CHOLHDL 6.6   Thyroid Function Tests:  Recent Labs Lab 10/04/14 1120  TSH 1.917   Urinalysis:  Recent Labs Lab 10/04/14 0020 10/04/14  1030  COLORURINE YELLOW* YELLOW  LABSPEC 1.009 1.015  PHURINE 7.0 5.5  GLUCOSEU NEGATIVE NEGATIVE  HGBUR NEGATIVE NEGATIVE  BILIRUBINUR NEGATIVE NEGATIVE  KETONESUR NEGATIVE NEGATIVE  PROTEINUR NEGATIVE NEGATIVE  UROBILINOGEN  --  0.2  NITRITE NEGATIVE NEGATIVE  LEUKOCYTESUR TRACE* NEGATIVE    Studies/Results: Mr Card Morphology Wo/w Cm  10/05/2014   CLINICAL DATA:  Cardiomyopathy, aortic stenosis  EXAM: CARDIAC MRI  TECHNIQUE: The patient was scanned on a 1.5 Tesla GE magnet. A dedicated cardiac coil was used. Functional imaging was done using Fiesta sequences. 2,3, and 4 chamber views were done to assess for RWMA's. Modified Simpson's rule using a short axis stack was used to calculate an ejection fraction on a dedicated work [employer]. The patient received 33 cc of Multihance. After 10 minutes inversion recovery sequences were used to assess for infiltration and scar tissue.  CONTRAST:  33 cc Multihance  FINDINGS: On limited views of the lung fields, there did appear to be some airspace consolidation in  the left lower lobe.  There was prominent epicardial fat. The left ventricle was normal in size with mild LV hypertrophy. There was diffuse mild hypokinesis, EF 43%. The right ventricle was normal in size and systolic function. The atria were normal in size. There appeared to be trivial mitral regurgitation. The aortic valve was thickened and appeared to be bicuspid. There was restriction to aortic valve opening. There appeared to be at least moderate aortic stenosis. There was no significant aortic insufficiency.  On delayed enhancement imaging, there was no definite myocardial late gadolinium enhancement (LGE).  MEASUREMENTS: MEASUREMENTS LV EDV 161  LV SV 69  LV EF 43%  IMPRESSION: 1. Normal left ventricular size with mild LV hypertrophy. EF 43% with diffuse hypokinesis.  2.  Normal RV size and systolic function.  3. Bicuspid aortic valve with at least moderate aortic stenosis, would assess valve gradient by echo.  4. No definite myocardial LGE, so no definitive evidence for infiltrative disease, myocarditis, or prior myocardial infarction.  Dalton Mclean   Electronically Signed   By: Marca Ancona M.D.   On: 10/05/2014 17:35   Medications: I have reviewed the patient's current medications. Scheduled Meds: . aspirin EC  81 mg Oral Daily  . atorvastatin  80 mg Oral q1800  . furosemide  40 mg Intravenous Daily  . heparin subcutaneous  5,000 Units Subcutaneous 3 times per day  . pneumococcal 23 valent vaccine  0.5 mL Intramuscular Tomorrow-1000  . sodium chloride  3 mL Intravenous Q12H   Continuous Infusions: . sodium chloride 10 mL/hr at 10/05/14 0127   PRN Meds:.sodium chloride, acetaminophen, ALPRAZolam, nitroGLYCERIN, ondansetron (ZOFRAN) IV, sodium chloride, zolpidem Assessment/Plan:  Third degree heart block- EP consulted yesterday. MRI negative for Infiltrative disease, myocarditis or MI. EF estimated at 43%. Heart rate has improved and appears he has hardly required any pacer activity.  Appears to be improving, mostly sinus rhythm on tele with occasional dropped QRS complexes. - Cont to hold BB - Monitor today and transfer out of ICU tomorrow. - Pts father updated on the phone.  CHF- Systolic and diastolic- Significant diuresis on  IV lasix. BP stable. Exact etiology still unidentified. ?Viral. Transferrin normal, MRI not suggestive, TSH normal, HIV neg, UPEP and SPEP Pending. 2Lbs weight loss since 10/04/2014. - Cont lasix at 40 IV today, likely switch to PO lasix tomorrow  - Start low dose lisinopril-  daily.  Leukocytosis- Mild, persistent with ung consolidation present on MRI. But symptoms of  SOB have significantly improved on diuretics without antibiotics, no Cough. - Plan Monitor, if any suggestion of acute bact infection consider starting PO doxycycline and amoxyciline for CAP. Qtc- 466.  Dispo: Disposition is deferred at this time, awaiting improvement of current medical problems.     LOS: 2 days   Onnie Boer, MD 10/06/2014, 7:43 AM  PGY-2, IMTS.   I have examined the patient and reviewed assessment and plan and discussed with patient.  Agree with above as stated.  CHF sx improved.  One more dose of IV Lasix today.  Then PO Lasix tomorrow.  Start low dose ACE-I today.  No beta blocker given conduction abnormalities.  He has had some nonconducted P waves.  Pacer out. MRI nondiagnostic of infiltrative disease. Continue medical therapy.  I spoke to the patient's father as well today.    VARANASI,JAYADEEP S.

## 2014-10-06 NOTE — Care Management Note (Signed)
Case Management Note  Patient Details  Name: Nicholas Horton MRN: [id number] Date of Birth: Jan 30, 1976  Subjective/Objective:                 CHF  Action/Plan: Lives at home with fiance' - [name] # [phone]   Expected Discharge Date:  10/09/14               Expected Discharge Plan:  Home/Self Care  In-House Referral:     Discharge planning Services  CM Consult, M[facility] Program, Indigent Health Clinic  Post Acute Care Choice:    Choice offered to:     DME Arranged:    DME Agency:     HH Arranged:    HH Agency:     Status of Service:  In process, will continue to follow  Medicare Important Message Given:  No Date Medicare IM Given:    Medicare IM give by:    Date Additional Medicare IM Given:    Additional Medicare Important Message give by:     If discussed at Long Length of Stay Meetings, dates discussed:    Additional Comments: NCM spoke to pt and states he works 2 full-time jobsn and he is in the probationary period for one job to receive his benefits. Provided pt with with [facility] brochure. Explained NCM will arrange appt at time of dc and once he receives the appt he can utilize the clinic pharmacy to pick up his meds at a discounted price. Waiting final recommendations for home.  Elliot Cousin, RN 10/06/2014, 2:56 PM

## 2014-10-07 ENCOUNTER — Ambulatory Visit (HOSPITAL_COMMUNITY): Payer: MEDICAID

## 2014-10-07 DIAGNOSIS — I214 Non-ST elevation (NSTEMI) myocardial infarction: Principal | ICD-10-CM

## 2014-10-07 DIAGNOSIS — K088 Other specified disorders of teeth and supporting structures: Secondary | ICD-10-CM

## 2014-10-07 DIAGNOSIS — K089 Disorder of teeth and supporting structures, unspecified: Secondary | ICD-10-CM | POA: Diagnosis present

## 2014-10-07 DIAGNOSIS — F17200 Nicotine dependence, unspecified, uncomplicated: Secondary | ICD-10-CM | POA: Diagnosis present

## 2014-10-07 DIAGNOSIS — Z720 Tobacco use: Secondary | ICD-10-CM

## 2014-10-07 LAB — BASIC METABOLIC PANEL
Anion gap: 10 (ref 5–15)
BUN: 18 mg/dL (ref 6–20)
CO2: 22 mmol/L (ref 22–32)
CREATININE: 0.73 mg/dL (ref 0.61–1.24)
Calcium: 8.9 mg/dL (ref 8.9–10.3)
Chloride: 106 mmol/L (ref 101–111)
GFR calc Af Amer: >60 mL/min (ref >60)
GFR calc non Af Amer: >60 mL/min (ref >60)
GLUCOSE: 104 mg/dL — AB (ref 70–99)
Potassium: 3.7 mmol/L (ref 3.5–5.1)
Sodium: 138 mmol/L (ref 135–145)

## 2014-10-07 LAB — CBC
HEMATOCRIT: 41.9 % (ref 39.0–52.0)
Hemoglobin: 14.3 g/dL (ref 13.0–17.0)
MCH: 31.3 pg (ref 26.0–34.0)
MCHC: 34.1 g/dL (ref 30.0–36.0)
MCV: 91.7 fL (ref 78.0–100.0)
Platelets: 158 10*3/uL (ref 150–400)
RBC: 4.57 MIL/uL (ref 4.22–5.81)
RDW: 12.7 % (ref 11.5–15.5)
WBC: 11.2 10*3/uL — AB (ref 4.0–10.5)

## 2014-10-07 LAB — URINE CULTURE: Colony Count: >=100000

## 2014-10-07 MED ORDER — ASPIRIN 81 MG PO TBEC: 81.0000 mg | DELAYED_RELEASE_TABLET | Freq: Every day | ORAL | Status: DC

## 2014-10-07 MED ORDER — LISINOPRIL 5 MG PO TABS: 5.0000 mg | ORAL_TABLET | Freq: Every day | ORAL | Status: DC

## 2014-10-07 MED ORDER — ATORVASTATIN CALCIUM 80 MG PO TABS: 80.0000 mg | ORAL_TABLET | Freq: Every day | ORAL | Status: DC

## 2014-10-07 MED ORDER — FUROSEMIDE 40 MG PO TABS
40.0000 mg | ORAL_TABLET | Freq: Every day | ORAL | Status: DC
  Administered 2014-10-07: 40 mg via ORAL
  Filled 2014-10-07: qty 1

## 2014-10-07 MED ORDER — FUROSEMIDE 40 MG PO TABS: 40.0000 mg | ORAL_TABLET | Freq: Every day | ORAL | Status: DC

## 2014-10-07 MED ORDER — FUROSEMIDE 10 MG/ML IJ SOLN
INTRAMUSCULAR | Status: AC
  Filled 2014-10-07: qty 4

## 2014-10-07 NOTE — Progress Notes (Signed)
CARDIOLOGY SERVICE  Subjective: No compliants this morning. Breathing has significantly improved and appetite also.     Objective: Vital signs in last 24 hours: Filed Vitals:   10/07/14 0305 10/07/14 0400 10/07/14 0500 10/07/14 0600  BP: 94/40 88/43 101/48 90/50  Pulse:      Temp:  98.2 F (36.8 C)    TempSrc:  Oral    Resp: 24 14 16 14   Height:      Weight:  217 lb 2.5 oz (98.5 kg)    SpO2:  92%     Weight change: -14.1 oz (-0.4 kg)  Intake/Output Summary (Last 24 hours) at 10/07/14 0735 Last data filed at 10/07/14 0300  Gross per 24 hour  Intake    720 ml  Output   1575 ml  Net   -855 ml   Physical Exam-  General appearance: alert, cooperative, appears stated age and no distress Lungs: Clear to auscultation Heart: regular rate and rhythm, persistent 3/6 systolic murmur heard in all area but appears loudest aortic area, MRI shows Bicuspid aortic valves.  Abdomen: soft, non-tender; bowel sounds normal; no masses,  no organomegaly Extremities: extremities normal, atraumatic, no cyanosis or edema, pulses present- better on right. Skin: Skin color, texture, turgor normal. No rashes or lesions.  Tele- Mostly Sinus rhythm, PR interval appears prolonged- 1st degree heart block. 2 pauses, with one P wave premature and not conducted, otherwise 1 to 1 conduction.   Lab Results: Basic Metabolic Panel:  Recent Labs Lab 10/04/14 0053  10/06/14 0343 10/07/14 0305  NA  --   < > 139 138  K  --   < > 3.9 3.7  CL  --   < > 106 106  CO2  --   < > 21* 22  GLUCOSE  --   < > 103* 104*  BUN  --   < > 17 18  CREATININE  --   < > 0.79 0.73  CALCIUM  --   < > 8.9 8.9  MG 1.8  --   --   --   < > = values in this interval not displayed. Liver Function Tests:  Recent Labs Lab 10/04/14 0650 10/05/14 0233  AST 27 23  ALT 31 28  ALKPHOS 65 56  BILITOT 2.2* 2.3*  PROT 7.1 7.2  ALBUMIN 3.7 3.3*    Recent Labs Lab 10/03/14 2320  LIPASE 24   CBC:  Recent Labs Lab  10/03/14 2320 10/04/14 0650  10/06/14 0343 10/07/14 0305  WBC 17.1* 16.0*  < > 12.0* 11.2*  NEUTROABS 14.1* 11.8*  --   --   --   HGB 13.6 13.1  < > 14.7 14.3  HCT 40.7 39.7*  < > 42.3 41.9  MCV 92.3 92.5  < > 91.8 91.7  PLT 136* 139*  < > 139* 158  < > = values in this interval not displayed. Cardiac Enzymes:  Recent Labs Lab 10/04/14 0650 10/04/14 1120 10/04/14 1602 10/04/14 2209  CKTOTAL 142  --   --   --   CKMB 2.7  --   --   --   TROPONINI 0.06* 0.08* 0.14* 0.19*   Fasting Lipid Panel:  Recent Labs Lab 10/05/14 0233  CHOL 198  HDL 30*  LDLCALC 135*  TRIG 165*  CHOLHDL 6.6   Thyroid Function Tests:  Recent Labs Lab 10/04/14 1120  TSH 1.917   Urinalysis:  Recent Labs Lab 10/04/14 0020 10/04/14 1030  COLORURINE YELLOW* YELLOW  LABSPEC 1.009 1.015  PHURINE 7.0 5.5  GLUCOSEU NEGATIVE NEGATIVE  HGBUR NEGATIVE NEGATIVE  BILIRUBINUR NEGATIVE NEGATIVE  KETONESUR NEGATIVE NEGATIVE  PROTEINUR NEGATIVE NEGATIVE  UROBILINOGEN  --  0.2  NITRITE NEGATIVE NEGATIVE  LEUKOCYTESUR TRACE* NEGATIVE    Studies/Results: Mr Card Morphology Wo/w Cm  10/05/2014   CLINICAL DATA:  Cardiomyopathy, aortic stenosis  EXAM: CARDIAC MRI  TECHNIQUE: The patient was scanned on a 1.5 Tesla GE magnet. A dedicated cardiac coil was used. Functional imaging was done using Fiesta sequences. 2,3, and 4 chamber views were done to assess for RWMA's. Modified Simpson's rule using a short axis stack was used to calculate an ejection fraction on a dedicated work [employer]. The patient received 33 cc of Multihance. After 10 minutes inversion recovery sequences were used to assess for infiltration and scar tissue.  CONTRAST:  33 cc Multihance  FINDINGS: On limited views of the lung fields, there did appear to be some airspace consolidation in the left lower lobe.  There was prominent epicardial fat. The left ventricle was normal in size with mild LV hypertrophy. There was diffuse  mild hypokinesis, EF 43%. The right ventricle was normal in size and systolic function. The atria were normal in size. There appeared to be trivial mitral regurgitation. The aortic valve was thickened and appeared to be bicuspid. There was restriction to aortic valve opening. There appeared to be at least moderate aortic stenosis. There was no significant aortic insufficiency.  On delayed enhancement imaging, there was no definite myocardial late gadolinium enhancement (LGE).  MEASUREMENTS: MEASUREMENTS LV EDV 161  LV SV 69  LV EF 43%  IMPRESSION: 1. Normal left ventricular size with mild LV hypertrophy. EF 43% with diffuse hypokinesis.  2.  Normal RV size and systolic function.  3. Bicuspid aortic valve with at least moderate aortic stenosis, would assess valve gradient by echo.  4. No definite myocardial LGE, so no definitive evidence for infiltrative disease, myocarditis, or prior myocardial infarction.  Dalton Mclean   Electronically Signed   By: Marca Ancona M.D.   On: 10/05/2014 17:35   Medications: I have reviewed the patient's current medications. Scheduled Meds: . aspirin EC  81 mg Oral Daily  . atorvastatin  80 mg Oral q1800  . heparin subcutaneous  5,000 Units Subcutaneous 3 times per day  . lisinopril  5 mg Oral Daily  . pneumococcal 23 valent vaccine  0.5 mL Intramuscular Tomorrow-1000  . sodium chloride  3 mL Intravenous Q12H   Continuous Infusions: . sodium chloride 10 mL/hr at 10/05/14 0127   PRN Meds:.sodium chloride, acetaminophen, ALPRAZolam, nitroGLYCERIN, ondansetron (ZOFRAN) IV, sodium chloride, zolpidem Assessment/Plan:  Third degree heart block- Has improved, now appears to be a 1st degree heart block only.  - Cont to hold BB - Transfer out of ICU today to tele, otherwise likely discharge from ICU today if he tolerates O2 wean and is ambulating. - EKG this am.  CHF- Systolic and diastolic- Significant diuresis on  IV lasix. Net- 4.4 L since admission.  BP stable.  Exact etiology still unidentified. ?Viral. MRI not suggestive,  - Switch to PO lasix today-  daily - Cont Low dose lisinopril at  daily, uptitrate as Bp tolerates on follow up. - Cont lipitor  daily - ?need for Echo after MRI.  Leukocytosis- Mild, And Resolving .   Bicuspid Aortic Valve.- Follow.  Poor Dentition- Will need Dental follow up. Will put in discharge summary recommendations for dentist in [place] where he lives.  Dispo: Disposition is  deferred at this time, awaiting improvement of current medical problems.     LOS: 3 days   Onnie Boer, MD 10/07/2014, 7:35 AM  PGY-2, IMTS.   I have examined the patient and reviewed assessment and plan and discussed with patient.  Agree with above as stated.  Feels bettter.  Rhythm has stabilized.  Wean oxygen.  He is walking around without problem.  Will plan d/c later today if he continues to ambulate without difficulty.  He would need dental eval going forward as well.   Blanca Carreon S.

## 2014-10-07 NOTE — Discharge Summary (Signed)
Name: Bergen Magner MRN: [id number] DOB: February 07, 1976 39 y.o. PCP: No Pcp Per Patient  Date of Admission: 10/04/2014  8:11 AM Date of Discharge: 10/07/2014 Attending Physician: Marykay Lex, MD  Discharge Diagnosis:  Principal Problem:   Third degree heart block Active Problems:   NSTEMI, initial episode of care   Acute combined systolic and diastolic heart failure, NYHA class 3   Aortic valve stenosis, nonrheumatic   Tobacco use disorder   Poor dentition   Acute respiratory failure  Discharge Medications:   Medication List    TAKE these medications        aspirin 81 MG EC tablet  Take 1 tablet (81 mg total) by mouth daily.     atorvastatin 80 MG tablet  Commonly known as:  LIPITOR  Take 1 tablet (80 mg total) by mouth daily at 6 PM.     furosemide 40 MG tablet  Commonly known as:  LASIX  Take 1 tablet (40 mg total) by mouth daily.     lisinopril 5 MG tablet  Commonly known as:  PRINIVIL,ZESTRIL  Take 1 tablet (5 mg total) by mouth daily.        Disposition and follow-up:   Mr.Indio Vallecillo was discharged from [facility] in Good condition.  At the hospital follow up visit please address:  1. Follow up ECHO to evaluate cardiac function, Up-titration of ACE- inh as Bp and pulse tolerates, needs to follow up with a dentist. Improvement in PR interval on EKG. Pt will need a PCP.  2.  Labs / imaging needed at time of follow-up: Bmet- Check Cr while on Lasix.  3.  Pending labs/ test needing follow-up: None.  Follow-up Appointments: Follow-up Information    Follow up with HARDING, Piedad Climes, MD In 1 week.   Specialty:  Cardiology   Why:  You will be called with an appointment in a few days to see cardiologist in .   Contact information:   [address] [place] [address] [phone]     Cardiology- Dr Herbie Baltimore.  Discharge Instructions: Discharge Instructions    (HEART FAILURE PATIENTS) Call MD:  Anytime you  have any of the following symptoms: 1) 3 pound weight gain in 24 hours or 5 pounds in 1 week 2) shortness of breath, with or without a dry hacking cough 3) swelling in the hands, feet or stomach 4) if you have to sleep on extra pillows at night in order to breathe.    Complete by:  As directed      Call MD for:  difficulty breathing, headache or visual disturbances    Complete by:  As directed      Call MD for:  extreme fatigue    Complete by:  As directed      Call MD for:  persistant dizziness or light-headedness    Complete by:  As directed      Diet - low sodium heart healthy    Complete by:  As directed      Discharge instructions    Complete by:  As directed   We have started you on new medications, it is important that you take them everyday. The dose of some of your medications would be adjusted when you follow up with your doctor.  You will be called in a few days with your appointment with your new heart Doctor/cardiologist, please be sure you keep this appointment. These are the new medications you will be taking. 1.  Lisinopril- take one  tablet everyday. 2. Lipitor- take one  tablet every evening. 3. Lasix/Frusemide- take one  tablet everyday. 4. Aspirin- take one  tablet of aspirin every day.  If you have any of the symptoms that brought you to the hospital, be sure to go to the ED. Reduce salt in your diet and please quit smoking cigarettes. Also please check your weight everyday, write it down and take it along for your appointment.     Increase activity slowly    Complete by:  As directed            Consultations:  Electrophysiology.  Procedures Performed:  Dg Chest Port 1 View  10/04/2014   CLINICAL DATA:  Sternal chest pain, onset yesterday and worsened today. Dyspnea.  EXAM: PORTABLE CHEST - 1 VIEW  COMPARISON:  10/13/2009  FINDINGS: Moderate vascular and interstitial congestive changes are present, new. There is mild ground-glass opacity in the  central basilar regions which may represent alveolar edema. No large effusions are evident.  IMPRESSION: Probable congestive heart failure   Electronically Signed   By: Ellery Plunk M.D.   On: 10/04/2014 00:59   Mr Card Morphology Wo/w Cm  10/05/2014   CLINICAL DATA:  Cardiomyopathy, aortic stenosis  EXAM: CARDIAC MRI  TECHNIQUE: The patient was scanned on a 1.5 Tesla GE magnet. A dedicated cardiac coil was used. Functional imaging was done using Fiesta sequences. 2,3, and 4 chamber views were done to assess for RWMA's. Modified Simpson's rule using a short axis stack was used to calculate an ejection fraction on a dedicated work [employer] officer, [employer]. The patient received 33 cc of Multihance. After 10 minutes inversion recovery sequences were used to assess for infiltration and scar tissue.  CONTRAST:  33 cc Multihance  FINDINGS: On limited views of the lung fields, there did appear to be some airspace consolidation in the left lower lobe.  There was prominent epicardial fat. The left ventricle was normal in size with mild LV hypertrophy. There was diffuse mild hypokinesis, EF 43%. The right ventricle was normal in size and systolic function. The atria were normal in size. There appeared to be trivial mitral regurgitation. The aortic valve was thickened and appeared to be bicuspid. There was restriction to aortic valve opening. There appeared to be at least moderate aortic stenosis. There was no significant aortic insufficiency.  On delayed enhancement imaging, there was no definite myocardial late gadolinium enhancement (LGE).  MEASUREMENTS: MEASUREMENTS LV EDV 161  LV SV 69  LV EF 43%  IMPRESSION: 1. Normal left ventricular size with mild LV hypertrophy. EF 43% with diffuse hypokinesis.  2.  Normal RV size and systolic function.  3. Bicuspid aortic valve with at least moderate aortic stenosis, would assess valve gradient by echo.  4. No definite myocardial LGE, so no definitive evidence for  infiltrative disease, myocarditis, or prior myocardial infarction.  Dalton Mclean   Electronically Signed   By: Marca Ancona M.D.   On: 10/05/2014 17:35    2D Echo: None  10/05/2014 Cardiac MRI- IMPRESSION: 1. Normal left ventricular size with mild LV hypertrophy. EF 43% with diffuse hypokinesis. 2. Normal RV size and systolic function. 3. Bicuspid aortic valve with at least moderate aortic stenosis, would assess valve gradient by echo. 4. No definite myocardial LGE, so no definitive evidence for infiltrative disease, myocarditis, or prior myocardial infarction.  Cardiac Cath: 10/03/2013.  1. Angiographically minimal CAD 2. Dilated cardiomyopathy with severely reduced EF of roughly 20-25%; global hypokinesis  3. Moderate if not Severe Aortic Stenosis 4. Acute Combined Systolic and Diastolic Heart Failure with Elevated EDP of 30-40 MmHg 5. Third-Degree AV block with accelerated junctional escape beats/rhythm (rates of roughly 40-70 bpm) 6. Successful Temporary Transient Replacement with backup rate of 40 bpm.  Admission HPI:  Erion Weightman is a 39 y.o. male with no history of CAD. He has no ongoing medical issues. He has not had a recent checkup.  He went to [facility] 10/03/2014 for chest pain and SOB.  The patient had a cough and SOB starting 3 days ago. He then developed palpitations and chest pain, pressure. Upon arrival at [facility], he was in 3rd degree heart block, heart rates in the 40s and 50s in a junctional rhythm. His symptoms did not improve and his troponin was elevated. Of note he had a low-grade fever of 100.7.  He was anticoagulated with Lovenox 100 mg at 2:52 am, given SL NTG x 1, Zofran 4 mg, ASA 324 mg, nebulizers, Lipitor 80 mg and also given 2 mg morphine.  Because of the 3rd degree heart block and the elevated enzymes as well as ongoing chest pain, he was transferred emergently to [facility] and taken directly to the cath lab. Patient denies any hx of illicit drug use, alcohol use,  or family history of cardiomyopathy.  Hospital Course by problem list: Principal Problem:   Third degree heart block Active Problems:   NSTEMI, initial episode of care   Acute combined systolic and diastolic heart failure, NYHA class 3   Aortic valve stenosis, nonrheumatic   Tobacco use disorder   Poor dentition   Acute respiratory failure   Third Degree Heart Block- Presentation with dyspnea and chest pain. On admission- pt had a cardiac cath which revealed minimal Coronary artery disease, with EF- 20-25%. Pt then had a temporary backup transvenous pace maker inserted during his cath through his right femoral vein at a backup rate of 40. Etiology of heart block mostly likely due to dilated Cardiomyopathy- exact etiology unknown, initial concern for Viral Myocarditis- considering sudden onset, with fever and leukocytosis, but MRI done revealed normal LV size with EF- 43%, bicuspid aortic valve, no definite evidence of Infiltrative disease, myocarditis, or prior MI. Pt had to have the temporary pacer removed for the MRI- 10/05/2014. Pt heart block improved to a 1st degree heart block- PR interval-272ms via tele on discharge, with rates- 60s- 70s. Pt was also given lasix IV with significant diuresis with improvement in respiratory status. Pt to follow up with Cardiologist Dr Herbie Baltimore at [facility] on discharge.  Congestive heart failure- Systolic and diastolic. Details per Above. Pt was discharged on Low dose Lisinopril-  daily as Bp was soft, Lasix PO -  daily and to continue Lipitor-  daily. Pt was cautioned about quitting smoking, weight gain and salt intake.  Acute respiratory failure- Likely due to Congestive heart failure, with significant improvemement after Diuresis with IV lasix. Pt on admission had O2 sats 83%, requiring Non- rebreather. On discharge patient was ambulating on room air, with O2 sats maintained at about 97%.  Asymptomatic Bacteuria- With urine cultures growing >100  000 colonies of Ecoli (Extended Spectrum Beta Lactamase). Pt was without any symptoms throughout admission and so pt was not treated.  Discharge Vitals:   BP 105/51 mmHg  Pulse 63  Temp(Src) 97.7 F (36.5 C) (Oral)  Resp 17  Ht  (1.88 m)  Wt 217 lb 2.5 oz (98.5 kg)  BMI 27.87 kg/m2  SpO2 95%  Discharge Labs:  Results for orders placed or performed during the hospital encounter of 10/04/14 (from the past 24 hour(s))  CBC     Status: Abnormal   Collection Time: 10/07/14  3:05 AM  Result Value Ref Range   WBC 11.2 (H) 4.0 - 10.5 K/uL   RBC 4.57 4.22 - 5.81 MIL/uL   Hemoglobin 14.3 13.0 - 17.0 g/dL   HCT 26.3 33.5 - 45.6 %   MCV 91.7 78.0 - 100.0 fL   MCH 31.3 26.0 - 34.0 pg   MCHC 34.1 30.0 - 36.0 g/dL   RDW 25.6 38.9 - 37.3 %   Platelets 158 150 - 400 K/uL  Basic metabolic panel     Status: Abnormal   Collection Time: 10/07/14  3:05 AM  Result Value Ref Range   Sodium 138 135 - 145 mmol/L   Potassium 3.7 3.5 - 5.1 mmol/L   Chloride 106 101 - 111 mmol/L   CO2 22 22 - 32 mmol/L   Glucose, Bld 104 (H) 70 - 99 mg/dL   BUN 18 6 - 20 mg/dL   Creatinine, Ser 4.28 0.61 - 1.24 mg/dL   Calcium 8.9 8.9 - 76.8 mg/dL   GFR calc non Af Amer >60 >60 mL/min   GFR calc Af Amer >60 >60 mL/min   Anion gap 10 5 - 15    Signed: Onnie Boer, MD  IMTS, PGY-2 10/07/2014, 4:35 PM   Services Ordered on Discharge: None. Equipment Ordered on Discharge: None.   I have examined the patient and reviewed assessment and plan and discussed with patient.  Agree with above as stated.  NSTEMI without CAD.  ? Myocarditis.  Bicuspid aortic valve.  Plan echo in a few months to reassess LV function and aortic valve stenosis.  No beta blocker due to conduction abnormalities.  Tolerating ACE-I.  F/u with Dr. Herbie Baltimore in [place].  Regina Coppolino S.

## 2014-10-07 NOTE — Discharge Instructions (Signed)

## 2014-10-07 NOTE — Clinical Documentation Improvement (Signed)
  Query #[id number] 39 year old male admitted with acute on chronic systolic and diastolic heart failure and 3rd degree heart block.  ABG results show a P02 of 40 on 10/04/14.  Patient has noted to be hypoxic on arrival to cath lab with an 02 saturation of 79% per dictated cath lab report.  02 sats in the 70's and 80's per flowsheets with respiratory rates in the teens to high 20's.  Patient required a 100% NRB mask to maintain 02 sats into the 90's.  Please document in the progress notes and discharge summary if a condition below provides greater specificity regarding the patient's respiratory status on admission:   - Acute Hypoxic Respiratory Failure 2/2 ........................, resolved.  - Other Condition  - Unable to clinically determine  Query #2 Urine cx results this admission 10/04/14:  >100,000 Ecoli.   If clinically appropriate, please document a condition that represents the urine cs results, including any associated treatment.   Thank You, Jerral Ralph ,RN Clinical Documentation Specialist:  [phone] [facility]- Health Information Management

## 2014-10-07 NOTE — Progress Notes (Signed)
Walk test:    patent ambulated around entire unit on room air sats stayed above 97% for entire walk, he has been off of o2 for greater than 2 hours with no difficulty either.  Will continue to monitor

## 2014-10-08 LAB — CULTURE, BLOOD (ROUTINE X 2): Culture: NO GROWTH

## 2014-10-09 LAB — CULTURE, BLOOD (ROUTINE X 2): Culture: NO GROWTH

## 2014-10-12 ENCOUNTER — Encounter: Payer: Self-pay | Admitting: Cardiology

## 2014-10-12 NOTE — Progress Notes (Signed)
   PATIENT: Nicholas Horton MRN: [id number] DOB: 1975-08-02 PCP: No PCP Per Patient  Clinic Note: No chief complaint on file.   HPI: Nicholas Horton is a 39 y.o. male with a PMH below who presents today for Hospital f/u - 10/04/2014. He was transferred from [facility] ER for ~NSTEMI/ CHF & 3rd Deg AVB - taken urgentlyl to cardiac cath - noted at least Mod AS, minimal CAD, but severely reduced LVEF.Marland Kitchen  Had Temp TPM until NSR restored.   Past Medical History  Diagnosis Date  . Congenital bicuspid aortic valve 09/2014  . Moderate aortic stenosis by prior echocardiogram 10/04/2014    ; possibly severe. Mean gradient and cardiac catheterization lab of 32 mmHg.  Marland Kitchen Nonischemic cardiomyopathy     EF ~20-30% by LV Gram, 43% by MRI  . Third degree heart block 10/04/2014    Transient CHB related to Acute Combined HF  . Chronic combined systolic and diastolic CHF (congestive heart failure) 09/2014    Prior Cardiac Evaluation and Past Surgical History: Past Surgical History  Procedure Laterality Date  . Surgery scrotal / testicular      for undescended testicle as a child  . Cardiac catheterization N/A 10/04/2014    Procedure: Left Heart Cath and Coronary Angiography;  Surgeon: Marykay Lex, MD;  Location: [facility] INVASIVE CV LAB;  Service: Cardiovascular;  Laterality: N/A;  . Cardiac catheterization  10/04/2014    Procedure: Temporary Pacemaker;  Surgeon: Marykay Lex, MD;  Location: [facility] INVASIVE CV LAB;  Service: Cardiovascular;;  Cardiac Cath: 10/03/2013.  1. Angiographically minimal CAD 2. Dilated cardiomyopathy with severely reduced EF of roughly 20-25%; global hypokinesis 3. Moderate if not Severe Aortic Stenosis 4. Acute Combined Systolic and Diastolic Heart Failure with Elevated EDP of 30-40 MmHg 5. Third-Degree AV block with accelerated junctional escape beats/rhythm (rates of roughly 40-70 bpm) 6. Successful Temporary Transient Replacement with backup rate of 40 bpm  10/05/2014 Cardiac MRI-  IMPRESSION: 1. Normal left ventricular size with mild LV hypertrophy. EF 43% with diffuse hypokinesis. 2. Normal RV size and systolic function. 3. Bicuspid aortic valve with at least moderate aortic stenosis, would assess valve gradient by echo. 4. No definite myocardial LGE, so no definitive evidence for infiltrative disease, myocarditis, or prior myocardial infarction.   No Known Allergies  Current Outpatient Prescriptions  Medication Sig Dispense Refill  . aspirin EC 81 MG EC tablet Take 1 tablet (81 mg total) by mouth daily. 30 tablet 1  . atorvastatin (LIPITOR) 80 MG tablet Take 1 tablet (80 mg total) by mouth daily at 6 PM. 30 tablet 1  . furosemide (LASIX) 40 MG tablet Take 1 tablet (40 mg total) by mouth daily. 30 tablet 0  . lisinopril (PRINIVIL,ZESTRIL) 5 MG tablet Take 1 tablet (5 mg total) by mouth daily. 30 tablet 0   No current facility-administered medications for this visit.    History   Social History Narrative   Lives in [place], [place] with fiance. No history of CAD or PPM, arrhythmia in parents or siblings.    family history is not on file.  ROS: A comprehensive Review of Systems - was performed. ROS    This encounter was created in error - please disregard.

## 2014-10-13 NOTE — Progress Notes (Signed)
PATIENT: Nicholas Horton MRN: [id number] DOB: 15-Jan-1976 PCP: No PCP Per Patient  Clinic Note: Chief Complaint  Patient presents with  . post hospital    patient reports having some SOB. patient wife says that she has see him "grab his chest" a couple of times although patient denies having any chest pain.    HPI: Nicholas Horton is a 39 y.o. male with a PMH below who presents today for hospital f/u. Admitted from May 8-11, 2016 Seng was transferred from [facility] to [facility] for chest pain and complete heart block. He was taken urgently to the cardiac catheterization lab where he was found to have nonobstructive CAD, but third-degree AV block requiring temporary pacemaker placement. Is LV gram demonstrated severe nonischemic cardiomyopathy and with evidence of at least moderate aortic stenosis. He was aggressively diuresed and has significant improvement of his symptoms. EF by MRI was notably improved compared to LV gram (increased up to 43% by MRI)  Had a cardiac MRI performed to look for any abnormalities explaining his temporary complete heart block. As he was relatively quickly weaned off of the temporary pacemaker, and the hope was that he will not require long-term pacemaker placement. He was seen by Dr. Lewayne Bunting. He never did have his echocardiogram during his patient's stay. The plan was to have this rechecked in a few months. The plan was for him to follow-up with me in [place], however I did not have any clinic available in [place] for several weeks. He therefore is following up here at the [facility] office.  Interval History: since his discharge, he this is not really been feeling himself. He says he occasionally has these episodes where he'll get short of breath and maybe feels a strain palpitations his chest. Has some tightness at age later at the same time. He says he needs to stay cool and lower aorta. Not necessarily do much the way of significant edema to have  occasional PND symptoms but not baseline orthopnea.  The remainder of cardiac review of systems is as follows: Cardiovascular ROS: positive for - dyspnea on exertion, irregular heartbeat, orthopnea and shortness of breath negative for - loss of consciousness, rapid heart rate or TIA/CVA or amaurosis fugax, syncope/near syncope :  TIA/amaurosis fugax, syncope/near syncope  Past Medical History  Diagnosis Date  . Congenital bicuspid aortic valve 09/2014  . Moderate aortic stenosis by prior echocardiogram 10/04/2014    ; possibly severe. Mean gradient and cardiac catheterization lab of 32 mmHg.  Marland Kitchen Nonischemic cardiomyopathy     EF ~20-30% by LV Gram, 43% by MRI  . Third degree heart block 10/04/2014    Transient CHB related to Acute Combined HF  . Chronic combined systolic and diastolic CHF (congestive heart failure) 09/2014    Prior Cardiac Evaluation and Past Surgical History: Past Surgical History  Procedure Laterality Date  . Surgery scrotal / testicular      for undescended testicle as a child  . Cardiac catheterization N/A 10/04/2014    Procedure: Left Heart Cath and Coronary Angiography;  Surgeon: Marykay Lex, MD;  Location: [facility] INVASIVE CV LAB;  Service: Cardiovascular;  Laterality: N/A;  . Cardiac catheterization  10/04/2014    Procedure: Temporary Pacemaker;  Surgeon: Marykay Lex, MD;  Location: [facility] INVASIVE CV LAB;  Service: Cardiovascular;;    No Known Allergies  Current Outpatient Prescriptions  Medication Sig Dispense Refill  . aspirin EC 81 MG EC tablet Take 1 tablet (81 mg total) by mouth daily.  30 tablet 1  . lisinopril (PRINIVIL,ZESTRIL) 5 MG tablet Take 1 tablet (5 mg total) by mouth daily. 30 tablet 0  . amoxicillin (AMOXIL) 500 MG tablet Take 1 tablet (500 mg total) by mouth 3 (three) times daily. 30 tablet 0  . atorvastatin (LIPITOR) 40 MG tablet Take 1 tablet (40 mg total) by mouth daily. 30 tablet 11  . furosemide (LASIX) 40 MG tablet Take 1 tablet (40 mg total)  by mouth 2 (two) times daily as needed. 60 tablet 11  . spironolactone (ALDACTONE) 25 MG tablet Take 0.5 tablets (12.5 mg total) by mouth daily. 15 tablet 11   No current facility-administered medications for this visit.    History   Social History Narrative   Lives in [place], [place] with fiance. No history of CAD or PPM, arrhythmia in parents or siblings.    family history is not on file.mother and father are both alive but in no obvious cardiopulmonary disease.  ROS: A comprehensive Review of Systems - was performed Review of Systems  Constitutional: Positive for malaise/fatigue.  HENT: Negative for congestion and nosebleeds.   Respiratory: Positive for shortness of breath. Negative for cough and wheezing.   Cardiovascular: Positive for chest pain and leg swelling.  Gastrointestinal: Positive for nausea. Negative for blood in stool and melena.  Genitourinary: Negative for hematuria.  Musculoskeletal: Positive for joint pain. Negative for falls.  Neurological: Positive for dizziness and headaches.  Endo/Heme/Allergies: Does not bruise/bleed easily.  Psychiatric/Behavioral: Negative for depression. The patient is nervous/anxious.   All other systems reviewed and are negative.   PHYSICAL EXAM BP 120/60 mmHg  Pulse 77  Ht 6' 2.5" (1.892 m)  Wt 98.204 kg (216 lb 8 oz)  BMI 27.43 kg/m2 General appearance: alert, cooperative, appears stated age and no distress Neck: no adenopathy, no carotid bruit, no JVD, supple, symmetrical, trachea midline and thyroid not enlarged, symmetric, no tenderness/mass/nodules Lungs: clear to auscultation bilaterally, normal percussion bilaterally and Nonlabored, good air movement Heart: RRR, normal S1/S2. 3/6 mid to late peaking C-D SEM at RUSB --> carotids, soft HSM @ apex Abdomen: soft, non-tender; bowel sounds normal; no masses,  no organomegaly Extremities: extremities normal, atraumatic, no cyanosis or edema Pulses: 2+ and symmetric Skin: Skin  color, texture, turgor normal. No rashes or lesions Neurologic: Alert and oriented X 3, normal strength and tone. Normal symmetric reflexes. Normal coordination and gait Mental status: Alert, oriented, thought content appropriate, affect: blunted Cranial nerves: normal HEENT: [place]/AT, EOMI, MMM, anicteric sclera; very poor dentition   Adult ECG Report  Rate: 77 ;  Rhythm: normal sinus rhythm and 1 AVB, RAA, CRO IMI - au, Poor Ant R wave progression - cro AMI au  Narrative Interpretation: stable EKG  Recent Labs:   ASSESSMENT / PLAN: This is a relatively strange situation with a young manadmitted for profound heart failure symptoms with nonischemic cardiomyopathy and at least moderate aortic stenosis via bicuspid aortic valve. The EF by MRI did seem to be better than by left angiogram. He should have a followup echo in one to 2 months just to see if there is any evidence of improvement but also to evaluate the valve with gradients.  Problem List Items Addressed This Visit    Acute combined systolic and diastolic heart failure, NYHA class 3   Relevant Medications   atorvastatin (LIPITOR) 40 MG tablet   spironolactone (ALDACTONE) 25 MG tablet   furosemide (LASIX) 40 MG tablet   Other Relevant Orders   EKG 12-Lead (Completed)  ECHOCARDIOGRAM COMPLETE   Bicuspid aortic valve    Moderate gradient by cath. We will see what looks like on resting Doppler studies of both arm and leg.  And surgery at some point, not sure if it is any role in his recent hospital stay.      Relevant Medications   atorvastatin (LIPITOR) 40 MG tablet   spironolactone (ALDACTONE) 25 MG tablet   furosemide (LASIX) 40 MG tablet   Dyspnea (Chronic)   Relevant Orders   EKG 12-Lead (Completed)   ECHOCARDIOGRAM COMPLETE   Moderate aortic stenosis; possibly severe. Mean gradient and cardiac catheterization lab of 32 mmHg.    He had a history of a having a murmur as a child. It now he clearly has evidence of aortic  stenosis.  There appears to be some mean gradient closer to valve stenosis than nonsmokers.  He may room more significant valve stenosis but believed to be investigated echocardiographically.      Relevant Medications   atorvastatin (LIPITOR) 40 MG tablet   spironolactone (ALDACTONE) 25 MG tablet   furosemide (LASIX) 40 MG tablet   Mouth abscess    He doesn't bicuspid aortic valve, but a significant gradient and should probably be on arterial prophylaxis. He currently has a dental abscess on exam he is adopted tenderness to palpation of the lower jaw and has 4 with intention. I will give him a 10 day course of amoxicillin. He will need to have these teeth evaluated prior to consideration of aVR. He is hoping to try to get an appointment to see a dentist in a low co-pay  Dentist clinic at [facility]. I will provide a letter that will allow him to show a referral.  He probably needs most of his teeth pulled and to be fitted for dentures.      Nonischemic cardiomyopathy    Eventually had improved thumb function on his MRI with no obvious abnormalities noted from her wall motion standpoint. He is on lisinopril. He is not yet on a beta blocker because of his initial presentation with complete heart block. We will get a full echocardiogram in June due to a better assessment of his EF as well as the aortic valve.      Relevant Medications   atorvastatin (LIPITOR) 40 MG tablet   spironolactone (ALDACTONE) 25 MG tablet   furosemide (LASIX) 40 MG tablet   NSTEMI, initial episode of care   Relevant Medications   atorvastatin (LIPITOR) 40 MG tablet   spironolactone (ALDACTONE) 25 MG tablet   furosemide (LASIX) 40 MG tablet   Other Relevant Orders   EKG 12-Lead (Completed)   ECHOCARDIOGRAM COMPLETE   Third degree heart block - Primary    No longer present. I think this may have been related to valve disease plus dilated cardiomyopathy.      Relevant Medications   atorvastatin (LIPITOR) 40 MG  tablet   spironolactone (ALDACTONE) 25 MG tablet   furosemide (LASIX) 40 MG tablet   Other Relevant Orders   EKG 12-Lead (Completed)   ECHOCARDIOGRAM COMPLETE   Tobacco use disorder   Relevant Orders   EKG 12-Lead (Completed)   ECHOCARDIOGRAM COMPLETE      Meds ordered this encounter  Medications  . DISCONTD: atorvastatin (LIPITOR) 40 MG tablet    Sig: Take 1 tablet (40 mg total) by mouth daily.    Dispense:  30 tablet    Refill:  11  . atorvastatin (LIPITOR) 40 MG tablet    Sig: Take 1 tablet (  40 mg total) by mouth daily.    Dispense:  30 tablet    Refill:  11  . spironolactone (ALDACTONE) 25 MG tablet    Sig: Take 0.5 tablets (12.5 mg total) by mouth daily.    Dispense:  15 tablet    Refill:  11  . amoxicillin (AMOXIL) 500 MG tablet    Sig: Take 1 tablet (500 mg total) by mouth 3 (three) times daily.    Dispense:  30 tablet    Refill:  0  . DISCONTD: furosemide (LASIX) 40 MG tablet    Sig: Take 1 tablet (40 mg total) by mouth 2 (two) times daily as needed.    Dispense:  60 tablet    Refill:  11  . furosemide (LASIX) 40 MG tablet    Sig: Take 1 tablet (40 mg total) by mouth 2 (two) times daily as needed.    Dispense:  60 tablet    Refill:  11    PATIENT INSTRUCTIONS: DECREASE ATORVASTATIN TO 40 MG DAILY TAKE AT BEDTIME.   SLIDING SCALE FOR WEIGHT GAIN IF WEIGHT IS GREATER THAN 3 LBS TAKE  40 MG OF LASIX TWICE A DAY OR 80 MG DAILY UNTIL WEIGHT IS BACK TO DRY WEIGHT.  START SPIROLACTONE 12.5 MG ONE TABLET DAILY.  TAKE AMOXIL 500 MG ONE TABLET THREE TIMES A DAY FOR 10 DAYS  Followup: June 1/5 month    DAVID W. Herbie Baltimore, M.D., M.S. Interventional Cardiolgy [facility]

## 2014-10-14 ENCOUNTER — Ambulatory Visit (INDEPENDENT_AMBULATORY_CARE_PROVIDER_SITE_OTHER): Payer: Self-pay | Admitting: Cardiology

## 2014-10-14 ENCOUNTER — Encounter: Payer: Self-pay | Admitting: Cardiology

## 2014-10-14 VITALS — BP 120/60 | HR 77 | Ht 74.5 in | Wt 216.5 lb

## 2014-10-14 DIAGNOSIS — I429 Cardiomyopathy, unspecified: Secondary | ICD-10-CM

## 2014-10-14 DIAGNOSIS — Z720 Tobacco use: Secondary | ICD-10-CM

## 2014-10-14 DIAGNOSIS — Q231 Congenital insufficiency of aortic valve: Secondary | ICD-10-CM

## 2014-10-14 DIAGNOSIS — Q2381 Bicuspid aortic valve: Secondary | ICD-10-CM

## 2014-10-14 DIAGNOSIS — I428 Other cardiomyopathies: Secondary | ICD-10-CM

## 2014-10-14 DIAGNOSIS — I350 Nonrheumatic aortic (valve) stenosis: Secondary | ICD-10-CM

## 2014-10-14 DIAGNOSIS — F17200 Nicotine dependence, unspecified, uncomplicated: Secondary | ICD-10-CM

## 2014-10-14 DIAGNOSIS — K122 Cellulitis and abscess of mouth: Secondary | ICD-10-CM

## 2014-10-14 DIAGNOSIS — K089 Disorder of teeth and supporting structures, unspecified: Secondary | ICD-10-CM

## 2014-10-14 DIAGNOSIS — I5041 Acute combined systolic (congestive) and diastolic (congestive) heart failure: Secondary | ICD-10-CM

## 2014-10-14 DIAGNOSIS — K088 Other specified disorders of teeth and supporting structures: Secondary | ICD-10-CM

## 2014-10-14 DIAGNOSIS — I442 Atrioventricular block, complete: Secondary | ICD-10-CM

## 2014-10-14 DIAGNOSIS — I214 Non-ST elevation (NSTEMI) myocardial infarction: Secondary | ICD-10-CM

## 2014-10-14 DIAGNOSIS — R0600 Dyspnea, unspecified: Secondary | ICD-10-CM

## 2014-10-14 MED ORDER — ATORVASTATIN CALCIUM 40 MG PO TABS: 40.0000 mg | ORAL_TABLET | Freq: Every day | ORAL | Status: DC

## 2014-10-14 MED ORDER — AMOXICILLIN 500 MG PO TABS: 500.0000 mg | ORAL_TABLET | Freq: Three times a day (TID) | ORAL | Status: DC

## 2014-10-14 MED ORDER — FUROSEMIDE 40 MG PO TABS: 40.0000 mg | ORAL_TABLET | Freq: Two times a day (BID) | ORAL | Status: DC | PRN

## 2014-10-14 MED ORDER — SPIRONOLACTONE 25 MG PO TABS: 12.5000 mg | ORAL_TABLET | Freq: Every day | ORAL | Status: DC

## 2014-10-14 NOTE — Patient Instructions (Addendum)
DECREASE ATORVASTATIN TO 40 MG DAILY TAKE AT BEDTIME.   SLIDING SCALE FOR WEIGHT GAIN IF WEIGHT IS GREATER THAN 3 LBS TAKE  40 MG OF LASIX TWICE A DAY OR 80 MG DAILY UNTIL WEIGHT IS BACK TO DRY WEIGHT.  START SPIROLACTONE 12.5 MG ONE TABLET DAILY.  TAKE AMOXIL 500 MG ONE TABLET THREE TIMES A DAY FOR 10 DAYS  SCHEDULE IN June 2016 AT LEAST 45 DAYS AFTER 10/06/14 [facility] physician has requested that you have an echocardiogram. Echocardiography is a painless test that uses sound waves to create images of your heart. It provides your doctor with information about the size and shape of your heart and how well your heart's chambers and valves are working. This procedure takes approximately one hour. There are no restrictions for this procedure.   WILL LET YOU KNOW WHEN LETTER IS READY TO PICKUP AT THE [facility] OFFICE-(D[facility]AL CLINIC AT [facility]) Your physician wants you to follow-up in LATE June -EARLY July 2016  IN [place] OFFICE DR [facility]. You will receive a reminder letter in the mail two months in advance. If you don't receive a letter, please call our office to schedule the follow-up appointment.

## 2014-10-15 ENCOUNTER — Telehealth: Payer: Self-pay | Admitting: Cardiology

## 2014-10-15 NOTE — Telephone Encounter (Signed)
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     Please call when able to come to [place] to pick up.

## 2014-10-15 NOTE — Telephone Encounter (Signed)
Pt seen at [employer]. Will forward

## 2014-10-16 ENCOUNTER — Encounter: Payer: Self-pay | Admitting: Cardiology

## 2014-10-16 DIAGNOSIS — K122 Cellulitis and abscess of mouth: Secondary | ICD-10-CM | POA: Insufficient documentation

## 2014-10-16 NOTE — Assessment & Plan Note (Signed)
Moderate gradient by cath. We will see what looks like on resting Doppler studies of both arm and leg.  And surgery at some point, not sure if it is any role in his recent hospital stay.

## 2014-10-16 NOTE — Assessment & Plan Note (Signed)
Eventually had improved thumb function on his MRI with no obvious abnormalities noted from her wall motion standpoint. He is on lisinopril. He is not yet on a beta blocker because of his initial presentation with complete heart block. We will get a full echocardiogram in June due to a better assessment of his EF as well as the aortic valve.

## 2014-10-16 NOTE — Assessment & Plan Note (Signed)
He had a history of a having a murmur as a child. It now he clearly has evidence of aortic stenosis.  There appears to be some mean gradient closer to valve stenosis than nonsmokers.  He may room more significant valve stenosis but believed to be investigated echocardiographically.

## 2014-10-16 NOTE — Telephone Encounter (Signed)
AWAITING FOR DICTATION OF LETTER TO BE COMPLETE

## 2014-10-16 NOTE — Assessment & Plan Note (Signed)
No longer present. I think this may have been related to valve disease plus dilated cardiomyopathy.

## 2014-10-16 NOTE — Assessment & Plan Note (Signed)
He doesn't bicuspid aortic valve, but a significant gradient and should probably be on arterial prophylaxis. He currently has a dental abscess on exam he is adopted tenderness to palpation of the lower jaw and has 4 with intention. I will give him a 10 day course of amoxicillin. He will need to have these teeth evaluated prior to consideration of aVR. He is hoping to try to get an appointment to see a dentist in a low co-pay  Dentist clinic at [facility]. I will provide a letter that will allow him to show a referral.  He probably needs most of his teeth pulled and to be fitted for dentures.

## 2014-10-19 ENCOUNTER — Encounter: Payer: Self-pay | Admitting: Cardiology

## 2014-10-19 ENCOUNTER — Telehealth: Payer: Self-pay

## 2014-10-19 NOTE — Telephone Encounter (Signed)
S/w pt and notified  that letters from Dr. Herbie Baltimore were ready to be picked up.

## 2014-10-20 ENCOUNTER — Telehealth: Payer: Self-pay

## 2014-10-20 ENCOUNTER — Telehealth: Payer: Self-pay | Admitting: Cardiology

## 2014-10-20 NOTE — Telephone Encounter (Signed)
l mom for pt to call back. Pt needs to schedule echo for the week of 6/27, and f/u to see Dr. Herbie Baltimore early July. (Per AVS on 5/18)

## 2014-10-20 NOTE — Telephone Encounter (Signed)
Called [place] office to make appt for echo and follow up appt with Dr. Herbie Baltimore.  They will call patient to schedule.

## 2014-10-21 NOTE — Telephone Encounter (Signed)
Letter is ready.

## 2014-10-28 DIAGNOSIS — Z952 Presence of prosthetic heart valve: Secondary | ICD-10-CM

## 2014-10-28 HISTORY — DX: Presence of prosthetic heart valve: Z95.2

## 2014-11-12 DIAGNOSIS — Z950 Presence of cardiac pacemaker: Secondary | ICD-10-CM | POA: Insufficient documentation

## 2014-11-16 ENCOUNTER — Telehealth: Payer: Self-pay

## 2014-11-16 NOTE — Telephone Encounter (Signed)
Request from Disability [employer]ation Services , sent to [employer] on 11/17/2014.

## 2014-11-20 ENCOUNTER — Other Ambulatory Visit: Payer: Self-pay

## 2014-11-20 ENCOUNTER — Observation Stay
Admission: EM | Admit: 2014-11-20 | Discharge: 2014-11-21 | Disposition: A | Discharge status: Other Acute Inpatient Hospital | Payer: Self-pay | Attending: Internal Medicine | Admitting: Internal Medicine

## 2014-11-20 ENCOUNTER — Emergency Department: Payer: Self-pay

## 2014-11-20 ENCOUNTER — Ambulatory Visit (INDEPENDENT_AMBULATORY_CARE_PROVIDER_SITE_OTHER): Payer: Self-pay

## 2014-11-20 DIAGNOSIS — Q231 Congenital insufficiency of aortic valve: Secondary | ICD-10-CM | POA: Insufficient documentation

## 2014-11-20 DIAGNOSIS — Z7982 Long term (current) use of aspirin: Secondary | ICD-10-CM | POA: Insufficient documentation

## 2014-11-20 DIAGNOSIS — I350 Nonrheumatic aortic (valve) stenosis: Secondary | ICD-10-CM

## 2014-11-20 DIAGNOSIS — F17200 Nicotine dependence, unspecified, uncomplicated: Secondary | ICD-10-CM

## 2014-11-20 DIAGNOSIS — Z87891 Personal history of nicotine dependence: Secondary | ICD-10-CM | POA: Insufficient documentation

## 2014-11-20 DIAGNOSIS — I442 Atrioventricular block, complete: Secondary | ICD-10-CM

## 2014-11-20 DIAGNOSIS — K054 Periodontosis: Secondary | ICD-10-CM

## 2014-11-20 DIAGNOSIS — I5042 Chronic combined systolic (congestive) and diastolic (congestive) heart failure: Secondary | ICD-10-CM | POA: Insufficient documentation

## 2014-11-20 DIAGNOSIS — I5041 Acute combined systolic (congestive) and diastolic (congestive) heart failure: Secondary | ICD-10-CM

## 2014-11-20 DIAGNOSIS — Z8249 Family history of ischemic heart disease and other diseases of the circulatory system: Secondary | ICD-10-CM | POA: Insufficient documentation

## 2014-11-20 DIAGNOSIS — R55 Syncope and collapse: Secondary | ICD-10-CM

## 2014-11-20 DIAGNOSIS — I25119 Atherosclerotic heart disease of native coronary artery with unspecified angina pectoris: Secondary | ICD-10-CM | POA: Insufficient documentation

## 2014-11-20 DIAGNOSIS — I509 Heart failure, unspecified: Secondary | ICD-10-CM

## 2014-11-20 DIAGNOSIS — N390 Urinary tract infection, site not specified: Secondary | ICD-10-CM | POA: Insufficient documentation

## 2014-11-20 DIAGNOSIS — I440 Atrioventricular block, first degree: Secondary | ICD-10-CM | POA: Insufficient documentation

## 2014-11-20 DIAGNOSIS — I428 Other cardiomyopathies: Principal | ICD-10-CM | POA: Insufficient documentation

## 2014-11-20 DIAGNOSIS — R748 Abnormal levels of other serum enzymes: Secondary | ICD-10-CM | POA: Insufficient documentation

## 2014-11-20 DIAGNOSIS — I214 Non-ST elevation (NSTEMI) myocardial infarction: Secondary | ICD-10-CM

## 2014-11-20 DIAGNOSIS — J449 Chronic obstructive pulmonary disease, unspecified: Secondary | ICD-10-CM | POA: Insufficient documentation

## 2014-11-20 DIAGNOSIS — R0602 Shortness of breath: Secondary | ICD-10-CM | POA: Insufficient documentation

## 2014-11-20 DIAGNOSIS — R0600 Dyspnea, unspecified: Secondary | ICD-10-CM

## 2014-11-20 DIAGNOSIS — Z720 Tobacco use: Secondary | ICD-10-CM

## 2014-11-20 DIAGNOSIS — I209 Angina pectoris, unspecified: Secondary | ICD-10-CM

## 2014-11-20 DIAGNOSIS — K029 Dental caries, unspecified: Secondary | ICD-10-CM | POA: Insufficient documentation

## 2014-11-20 DIAGNOSIS — I252 Old myocardial infarction: Secondary | ICD-10-CM | POA: Insufficient documentation

## 2014-11-20 DIAGNOSIS — R079 Chest pain, unspecified: Secondary | ICD-10-CM | POA: Insufficient documentation

## 2014-11-20 LAB — COMPREHENSIVE METABOLIC PANEL
ALBUMIN: 4.3 g/dL (ref 3.5–5.0)
ALT: 27 U/L (ref 17–63)
AST: 25 U/L (ref 15–41)
Alkaline Phosphatase: 66 U/L (ref 38–126)
Anion gap: 9 (ref 5–15)
BUN: 20 mg/dL (ref 6–20)
CO2: 28 mmol/L (ref 22–32)
Calcium: 9.6 mg/dL (ref 8.9–10.3)
Chloride: 103 mmol/L (ref 101–111)
Creatinine, Ser: 1.06 mg/dL (ref 0.61–1.24)
GFR calc Af Amer: >60 mL/min (ref >60)
GFR calc non Af Amer: >60 mL/min (ref >60)
Glucose, Bld: 168 mg/dL — ABNORMAL HIGH (ref 65–99)
POTASSIUM: 3.7 mmol/L (ref 3.5–5.1)
Sodium: 140 mmol/L (ref 135–145)
TOTAL PROTEIN: 7.6 g/dL (ref 6.5–8.1)
Total Bilirubin: 0.6 mg/dL (ref 0.3–1.2)

## 2014-11-20 LAB — CBC
HEMATOCRIT: 39.8 % — AB (ref 40.0–52.0)
HEMOGLOBIN: 13.5 g/dL (ref 13.0–18.0)
MCH: 31 pg (ref 26.0–34.0)
MCHC: 34 g/dL (ref 32.0–36.0)
MCV: 91.4 fL (ref 80.0–100.0)
Platelets: 143 10*3/uL — ABNORMAL LOW (ref 150–440)
RBC: 4.35 MIL/uL — ABNORMAL LOW (ref 4.40–5.90)
RDW: 13.5 % (ref 11.5–14.5)
WBC: 11.9 10*3/uL — AB (ref 3.8–10.6)

## 2014-11-20 LAB — APTT: aPTT: 28 seconds (ref 24–36)

## 2014-11-20 LAB — BRAIN NATRIURETIC PEPTIDE: B NATRIURETIC PEPTIDE 5: 339 pg/mL — AB (ref 0.0–100.0)

## 2014-11-20 NOTE — ED Notes (Signed)
Brought to ED from home via ems for evaluation of near syncopal episode. Received A&O*3 speaking full sentence. Complaining of 5/10 Left sided chest pain.Denies SOB. Awaiting further evaluation.

## 2014-11-21 ENCOUNTER — Inpatient Hospital Stay (HOSPITAL_COMMUNITY): Payer: Self-pay

## 2014-11-21 ENCOUNTER — Encounter: Payer: Self-pay | Admitting: Internal Medicine

## 2014-11-21 ENCOUNTER — Inpatient Hospital Stay (HOSPITAL_COMMUNITY)
Admission: AD | Admit: 2014-11-21 | Discharge: 2014-12-08 | DRG: 219 | Disposition: A | Discharge status: Home / Self Care | Payer: Self-pay | Source: Other Acute Inpatient Hospital | Attending: Cardiothoracic Surgery | Admitting: Cardiothoracic Surgery

## 2014-11-21 DIAGNOSIS — D689 Coagulation defect, unspecified: Secondary | ICD-10-CM | POA: Diagnosis present

## 2014-11-21 DIAGNOSIS — I5043 Acute on chronic combined systolic (congestive) and diastolic (congestive) heart failure: Secondary | ICD-10-CM | POA: Diagnosis present

## 2014-11-21 DIAGNOSIS — K0520 Aggressive periodontitis, unspecified: Secondary | ICD-10-CM | POA: Diagnosis present

## 2014-11-21 DIAGNOSIS — R7881 Bacteremia: Secondary | ICD-10-CM | POA: Diagnosis present

## 2014-11-21 DIAGNOSIS — Z950 Presence of cardiac pacemaker: Secondary | ICD-10-CM

## 2014-11-21 DIAGNOSIS — E876 Hypokalemia: Secondary | ICD-10-CM | POA: Diagnosis not present

## 2014-11-21 DIAGNOSIS — Z952 Presence of prosthetic heart valve: Secondary | ICD-10-CM

## 2014-11-21 DIAGNOSIS — I350 Nonrheumatic aortic (valve) stenosis: Principal | ICD-10-CM

## 2014-11-21 DIAGNOSIS — R55 Syncope and collapse: Secondary | ICD-10-CM

## 2014-11-21 DIAGNOSIS — I252 Old myocardial infarction: Secondary | ICD-10-CM

## 2014-11-21 DIAGNOSIS — Z538 Procedure and treatment not carried out for other reasons: Secondary | ICD-10-CM

## 2014-11-21 DIAGNOSIS — K083 Retained dental root: Secondary | ICD-10-CM | POA: Diagnosis present

## 2014-11-21 DIAGNOSIS — R7989 Other specified abnormal findings of blood chemistry: Secondary | ICD-10-CM

## 2014-11-21 DIAGNOSIS — E119 Type 2 diabetes mellitus without complications: Secondary | ICD-10-CM | POA: Diagnosis present

## 2014-11-21 DIAGNOSIS — D62 Acute posthemorrhagic anemia: Secondary | ICD-10-CM | POA: Diagnosis not present

## 2014-11-21 DIAGNOSIS — M264 Malocclusion, unspecified: Secondary | ICD-10-CM | POA: Diagnosis present

## 2014-11-21 DIAGNOSIS — B9620 Unspecified Escherichia coli [E. coli] as the cause of diseases classified elsewhere: Secondary | ICD-10-CM | POA: Diagnosis present

## 2014-11-21 DIAGNOSIS — K0530 Chronic periodontitis, unspecified: Secondary | ICD-10-CM | POA: Diagnosis present

## 2014-11-21 DIAGNOSIS — N390 Urinary tract infection, site not specified: Secondary | ICD-10-CM | POA: Diagnosis present

## 2014-11-21 DIAGNOSIS — Z1623 Resistance to quinolones and fluoroquinolones: Secondary | ICD-10-CM | POA: Diagnosis present

## 2014-11-21 DIAGNOSIS — I420 Dilated cardiomyopathy: Secondary | ICD-10-CM | POA: Diagnosis present

## 2014-11-21 DIAGNOSIS — K054 Periodontosis: Secondary | ICD-10-CM

## 2014-11-21 DIAGNOSIS — I442 Atrioventricular block, complete: Secondary | ICD-10-CM | POA: Diagnosis not present

## 2014-11-21 DIAGNOSIS — I492 Junctional premature depolarization: Secondary | ICD-10-CM | POA: Diagnosis present

## 2014-11-21 DIAGNOSIS — F17200 Nicotine dependence, unspecified, uncomplicated: Secondary | ICD-10-CM | POA: Diagnosis present

## 2014-11-21 DIAGNOSIS — I248 Other forms of acute ischemic heart disease: Secondary | ICD-10-CM | POA: Diagnosis present

## 2014-11-21 DIAGNOSIS — I498 Other specified cardiac arrhythmias: Secondary | ICD-10-CM | POA: Diagnosis present

## 2014-11-21 DIAGNOSIS — Z87891 Personal history of nicotine dependence: Secondary | ICD-10-CM

## 2014-11-21 DIAGNOSIS — J9811 Atelectasis: Secondary | ICD-10-CM | POA: Diagnosis not present

## 2014-11-21 DIAGNOSIS — K029 Dental caries, unspecified: Secondary | ICD-10-CM | POA: Diagnosis present

## 2014-11-21 DIAGNOSIS — Z7982 Long term (current) use of aspirin: Secondary | ICD-10-CM

## 2014-11-21 DIAGNOSIS — Q231 Congenital insufficiency of aortic valve: Secondary | ICD-10-CM

## 2014-11-21 DIAGNOSIS — R0602 Shortness of breath: Secondary | ICD-10-CM

## 2014-11-21 DIAGNOSIS — K089 Disorder of teeth and supporting structures, unspecified: Secondary | ICD-10-CM | POA: Diagnosis present

## 2014-11-21 DIAGNOSIS — Z87440 Personal history of urinary (tract) infections: Secondary | ICD-10-CM

## 2014-11-21 DIAGNOSIS — K045 Chronic apical periodontitis: Secondary | ICD-10-CM | POA: Diagnosis present

## 2014-11-21 DIAGNOSIS — I429 Cardiomyopathy, unspecified: Secondary | ICD-10-CM

## 2014-11-21 LAB — URINALYSIS COMPLETE WITH MICROSCOPIC (ARMC ONLY)
BILIRUBIN URINE: NEGATIVE
Glucose, UA: NEGATIVE mg/dL
Hgb urine dipstick: NEGATIVE
Ketones, ur: NEGATIVE mg/dL
Leukocytes, UA: NEGATIVE
Nitrite: POSITIVE — AB
PH: 6 (ref 5.0–8.0)
Protein, ur: 30 mg/dL — AB
Specific Gravity, Urine: 1.021 (ref 1.005–1.030)
Squamous Epithelial / LPF: NONE SEEN

## 2014-11-21 LAB — CBC
HCT: 40.4 % (ref 39.0–52.0)
Hemoglobin: 13.7 g/dL (ref 13.0–17.0)
MCH: 30.9 pg (ref 26.0–34.0)
MCHC: 33.9 g/dL (ref 30.0–36.0)
MCV: 91 fL (ref 78.0–100.0)
PLATELETS: 157 10*3/uL (ref 150–400)
RBC: 4.44 MIL/uL (ref 4.22–5.81)
RDW: 13.2 % (ref 11.5–15.5)
WBC: 14.3 10*3/uL — ABNORMAL HIGH (ref 4.0–10.5)

## 2014-11-21 LAB — TROPONIN I
Troponin I: 0.04 ng/mL — ABNORMAL HIGH (ref <0.031)
Troponin I: 0.08 ng/mL — ABNORMAL HIGH (ref <0.031)
Troponin I: 0.38 ng/mL — ABNORMAL HIGH (ref <0.031)

## 2014-11-21 LAB — TSH: TSH: 3.104 u[IU]/mL (ref 0.350–4.500)

## 2014-11-21 LAB — CREATININE, SERUM
Creatinine, Ser: 0.99 mg/dL (ref 0.61–1.24)
GFR calc Af Amer: >60 mL/min (ref >60)
GFR calc non Af Amer: >60 mL/min (ref >60)

## 2014-11-21 MED ORDER — NITROGLYCERIN 0.4 MG SL SUBL: 0.4000 mg | SUBLINGUAL_TABLET | SUBLINGUAL | Status: DC | PRN

## 2014-11-21 MED ORDER — ASPIRIN EC 81 MG PO TBEC
81.0000 mg | DELAYED_RELEASE_TABLET | Freq: Every day | ORAL | Status: DC
  Administered 2014-11-21: 81 mg via ORAL
  Filled 2014-11-21: qty 1

## 2014-11-21 MED ORDER — FUROSEMIDE 40 MG PO TABS
40.0000 mg | ORAL_TABLET | Freq: Two times a day (BID) | ORAL | Status: DC
  Administered 2014-11-21 – 2014-11-30 (×17): 40 mg via ORAL
  Filled 2014-11-21 (×25): qty 1

## 2014-11-21 MED ORDER — FUROSEMIDE 10 MG/ML IJ SOLN
20.0000 mg | Freq: Once | INTRAMUSCULAR | Status: AC
  Administered 2014-11-21: 20 mg via INTRAVENOUS

## 2014-11-21 MED ORDER — MORPHINE SULFATE 2 MG/ML IJ SOLN: 2.0000 mg | INTRAMUSCULAR | Status: DC | PRN

## 2014-11-21 MED ORDER — FUROSEMIDE 10 MG/ML IJ SOLN
INTRAMUSCULAR | Status: AC
  Administered 2014-11-21: 20 mg via INTRAVENOUS
  Filled 2014-11-21: qty 4

## 2014-11-21 MED ORDER — FUROSEMIDE 40 MG PO TABS
40.0000 mg | ORAL_TABLET | Freq: Two times a day (BID) | ORAL | Status: DC
  Administered 2014-11-21: 40 mg via ORAL
  Filled 2014-11-21: qty 1

## 2014-11-21 MED ORDER — CEFTRIAXONE SODIUM IN DEXTROSE 20 MG/ML IV SOLN
1.0000 g | INTRAVENOUS | Status: AC
  Administered 2014-11-21: 1 g via INTRAVENOUS

## 2014-11-21 MED ORDER — SODIUM CHLORIDE 0.9 % IV SOLN: 250.0000 mL | INTRAVENOUS | Status: DC | PRN

## 2014-11-21 MED ORDER — ONDANSETRON HCL 4 MG/2ML IJ SOLN: 4.0000 mg | Freq: Four times a day (QID) | INTRAMUSCULAR | Status: DC | PRN

## 2014-11-21 MED ORDER — SPIRONOLACTONE 25 MG PO TABS
12.5000 mg | ORAL_TABLET | Freq: Every day | ORAL | Status: DC
  Administered 2014-11-21: 12.5 mg via ORAL
  Filled 2014-11-21: qty 1

## 2014-11-21 MED ORDER — LISINOPRIL 5 MG PO TABS
5.0000 mg | ORAL_TABLET | Freq: Every day | ORAL | Status: DC
  Administered 2014-11-21: 5 mg via ORAL
  Filled 2014-11-21: qty 1

## 2014-11-21 MED ORDER — SODIUM CHLORIDE 0.9 % IJ SOLN
3.0000 mL | Freq: Two times a day (BID) | INTRAMUSCULAR | Status: DC
  Administered 2014-11-21 (×2): 3 mL via INTRAVENOUS

## 2014-11-21 MED ORDER — ASPIRIN EC 81 MG PO TBEC
81.0000 mg | DELAYED_RELEASE_TABLET | Freq: Every day | ORAL | Status: DC
  Administered 2014-11-21 – 2014-11-30 (×9): 81 mg via ORAL
  Filled 2014-11-21 (×11): qty 1

## 2014-11-21 MED ORDER — SPIRONOLACTONE 12.5 MG HALF TABLET
12.5000 mg | ORAL_TABLET | Freq: Every day | ORAL | Status: DC
  Administered 2014-11-21 – 2014-11-30 (×9): 12.5 mg via ORAL
  Filled 2014-11-21 (×12): qty 1

## 2014-11-21 MED ORDER — ASPIRIN 81 MG PO CHEW
CHEWABLE_TABLET | ORAL | Status: AC
  Administered 2014-11-21: 243 mg via ORAL
  Filled 2014-11-21: qty 3

## 2014-11-21 MED ORDER — DOCUSATE SODIUM 100 MG PO CAPS
100.0000 mg | ORAL_CAPSULE | Freq: Two times a day (BID) | ORAL | Status: DC
  Filled 2014-11-21: qty 1

## 2014-11-21 MED ORDER — LISINOPRIL 5 MG PO TABS
5.0000 mg | ORAL_TABLET | Freq: Every day | ORAL | Status: DC
  Administered 2014-11-22 – 2014-11-30 (×7): 5 mg via ORAL
  Filled 2014-11-21 (×10): qty 1

## 2014-11-21 MED ORDER — SODIUM CHLORIDE 0.9 % IJ SOLN
3.0000 mL | Freq: Two times a day (BID) | INTRAMUSCULAR | Status: DC
  Administered 2014-11-21 – 2014-11-23 (×5): 3 mL via INTRAVENOUS
  Administered 2014-11-23: 11:00:00 via INTRAVENOUS
  Administered 2014-11-24 – 2014-11-30 (×10): 3 mL via INTRAVENOUS

## 2014-11-21 MED ORDER — HEPARIN SODIUM (PORCINE) 5000 UNIT/ML IJ SOLN
5000.0000 [IU] | Freq: Three times a day (TID) | INTRAMUSCULAR | Status: DC
  Administered 2014-11-21 – 2014-11-24 (×9): 5000 [IU] via SUBCUTANEOUS
  Filled 2014-11-21 (×12): qty 1

## 2014-11-21 MED ORDER — ATORVASTATIN CALCIUM 20 MG PO TABS
40.0000 mg | ORAL_TABLET | Freq: Every day | ORAL | Status: DC
  Administered 2014-11-21: 40 mg via ORAL
  Filled 2014-11-21: qty 2

## 2014-11-21 MED ORDER — CEFTRIAXONE SODIUM IN DEXTROSE 20 MG/ML IV SOLN
INTRAVENOUS | Status: AC
  Administered 2014-11-21: 1 g via INTRAVENOUS
  Filled 2014-11-21: qty 50

## 2014-11-21 MED ORDER — SODIUM CHLORIDE 0.9 % IJ SOLN: 3.0000 mL | INTRAMUSCULAR | Status: DC | PRN

## 2014-11-21 MED ORDER — HEPARIN SODIUM (PORCINE) 5000 UNIT/ML IJ SOLN
5000.0000 [IU] | Freq: Three times a day (TID) | INTRAMUSCULAR | Status: DC
  Administered 2014-11-21: 5000 [IU] via SUBCUTANEOUS
  Filled 2014-11-21: qty 1

## 2014-11-21 MED ORDER — ACETAMINOPHEN 325 MG PO TABS: 650.0000 mg | ORAL_TABLET | Freq: Four times a day (QID) | ORAL | Status: DC | PRN

## 2014-11-21 MED ORDER — ASPIRIN 81 MG PO CHEW
243.0000 mg | CHEWABLE_TABLET | Freq: Once | ORAL | Status: AC
  Administered 2014-11-21: 243 mg via ORAL

## 2014-11-21 MED ORDER — ACETAMINOPHEN 325 MG PO TABS: 650.0000 mg | ORAL_TABLET | ORAL | Status: DC | PRN

## 2014-11-21 MED ORDER — ONDANSETRON HCL 4 MG PO TABS: 4.0000 mg | ORAL_TABLET | Freq: Four times a day (QID) | ORAL | Status: DC | PRN

## 2014-11-21 MED ORDER — ATORVASTATIN CALCIUM 40 MG PO TABS
40.0000 mg | ORAL_TABLET | Freq: Every day | ORAL | Status: DC
Start: 2014-11-21 — End: 2014-12-08
  Administered 2014-11-21 – 2014-12-08 (×16): 40 mg via ORAL
  Filled 2014-11-21 (×18): qty 1

## 2014-11-21 MED ORDER — ACETAMINOPHEN 650 MG RE SUPP
650.0000 mg | Freq: Four times a day (QID) | RECTAL | Status: DC | PRN
Start: 2014-11-21 — End: 2014-11-21

## 2014-11-21 NOTE — Progress Notes (Signed)
Patient being transferred to [facility]. [facility] here for transportation. Report called to [facility] 2W, Maisie Fus RN. Patient in touch with family.  Nicholas Horton

## 2014-11-21 NOTE — Progress Notes (Signed)
    S:  Pt arrived from [facility].  Seen earlier this AM by Dr. Clifton James.  No chest pain or sob.  O:   Filed Vitals:   11/21/14 1401  BP: 117/67  Pulse: 113   Pleasant, NAD, AAOx3.  Lungs CTA. Cor RRR, 3/6 SEM RUSB.  A/P:  1. Mod-Sev Ao stenosis/Bicuspid AoV: Tx from [facility] today following admission for syncope with mild trop elevation.  Currently no complaints.  Cont current meds.  TCTS to see.   2.  NICM:  Cath 10/04/14 w/ mild CAD, EF 20-25% by V gram @ that time, 43% by MRI.  Cont PO lasix.  Cont acei/spiro.  No BB 2/2 h/o conduction abnormality.  3.  Dental Caries & Peridontitis:  Will obtain orthopantogram.   Nicolasa Ducking, NP

## 2014-11-21 NOTE — Progress Notes (Signed)
[facility] [place] at [facility]   PATIENT NAME: Nicholas Horton    MR#:  [id number]  DATE OF BIRTH:  03-03-1976  SUBJECTIVE:  CHIEF COMPLAINT:   Chief Complaint  Patient presents with  . Near Syncope   presented after syncope or was having some chest pains prior to syncopal episode. Feels good today. Was seen by cardiologist and felt that patient would benefit from cardiac surgical evaluation for bicuspid aortic valve and moderate aortic stenosis repair. No arrhythmia was was reported in our hospital, however patient had history of transient complete heart block a month ago. Cardiologist saw patient in consultation and felt the patient would benefit from a neurosurgical  Evaluation, recommended transfer to [facility]  Review of Systems  Unable to perform ROS: mental acuity   patient is very somnolent, difficult to arouse, however, able to converse briefly. Denies any significant pains or discomfort at present. Denies cough, admits of significant shortness of breath, even the slightest exertion , denies chest pains, abdominal pains, nausea, vomiting, diarrhea, constipation, urinary tract symptoms.   VITAL SIGNS: Blood pressure 117/71, pulse 71, temperature 98.2 F (36.8 C), temperature source Oral, resp. rate 20, height  (1.88 m), weight 99.156 kg (218 lb 9.6 oz), SpO2 94 %.  PHYSICAL EXAMINATION:   GENERAL:  39 y.o.-year-old patient lying in the bed with no acute distress. Sleeping, arousable, able to converse however, very sleepy EYES: Pupils equal, round, reactive to light and accommodation. No scleral icterus. Extraocular muscles intact.  HEENT: Head atraumatic, normocephalic. Oropharynx and nasopharynx clear.  NECK:  Supple, no jugular venous distention. No thyroid enlargement, no tenderness.  LUNGS: Normal breath sounds bilaterally, no wheezing, rales,rhonchi or crepitation. No use of accessory muscles of respiration.  CARDIOVASCULAR: S1,  S2 normal. Systolic murmur 4/6  noted, radiating to bilateral neck arteries, no rubs, or gallops.  ABDOMEN: Soft, nontender, nondistended. Bowel sounds present. No organomegaly or mass.  EXTREMITIES: No pedal edema, cyanosis, or clubbing.  NEUROLOGIC: Cranial nerves II through XII are intact. Muscle strength 5/5 in all extremities. Sensation intact. Gait not checked.  PSYCHIATRIC: The patient is alert and oriented x 3.  SKIN: No obvious rash, lesion, or ulcer.   ORDERS/RESULTS REVIEWED:   CBC  Recent Labs Lab 11/20/14 2216  WBC 11.9*  HGB 13.5  HCT 39.8*  PLT 143*  MCV 91.4  MCH 31.0  MCHC 34.0  RDW 13.5   ------------------------------------------------------------------------------------------------------------------  Chemistries   Recent Labs Lab 11/20/14 2216  NA 140  K 3.7  CL 103  CO2 28  GLUCOSE 168*  BUN 20  CREATININE 1.06  CALCIUM 9.6  AST 25  ALT 27  ALKPHOS 66  BILITOT 0.6   ------------------------------------------------------------------------------------------------------------------ estimated creatinine clearance is 118.9 mL/min (by C-G formula based on Cr of 1.06). ------------------------------------------------------------------------------------------------------------------  Recent Labs  11/21/14 0149  TSH 3.104    Cardiac Enzymes  Recent Labs Lab 11/20/14 2216 11/21/14 0131 11/21/14 0802  TROPONINI 0.04* 0.08* 0.38*   ------------------------------------------------------------------------------------------------------------------ Invalid input(s): POCBNP ---------------------------------------------------------------------------------------------------------------  RADIOLOGY: Dg Chest Portable 1 View  11/20/2014   CLINICAL DATA:  Acute onset of left-sided chest pain. Initial encounter.  EXAM: PORTABLE CHEST - 1 VIEW  COMPARISON:  Chest radiograph performed 10/03/2014  FINDINGS: The lungs are well-aerated. Peribronchial  thickening is noted. Mild vascular congestion is seen. Mild bibasilar atelectasis is noted. There is no evidence of pleural effusion or pneumothorax.  The cardiomediastinal silhouette is within normal limits. No acute osseous abnormalities are seen.  IMPRESSION:  Peribronchial thickening noted. Mild vascular congestion seen. Mild bibasilar atelectasis noted.   Electronically Signed   By: Roanna Raider M.D.   On: 11/20/2014 22:33    EKG:  Orders placed or performed during the hospital encounter of 11/20/14  . ED EKG  . ED EKG  . EKG 12-Lead  . EKG 12-Lead    ASSESSMENT AND PLAN:  Principal Problem:   Chest pain Active Problems:   Nonischemic cardiomyopathy 1. Elevated troponin, likely demand ischemia. Patient would benefit from a neurosurgical evaluation to address issue of aortic valve stenosis is likely contributing to patient's chest pains as well as shortness of breath as well as troponin elevation. She did have cardiac catheterization proximal months ago at [facility], which was in unremarkable for coronary artery disease, although patient's ejection fraction was found to be low at 20%, now on most recent echocardiogram. Her his ejection fraction was noted to be 50% 2. Syncope,  questionable arrhythmia, cardiac block related. None seen on telemetry on the floor. The patient may benefit from pacemaker if he is noted to have a cardiac block on telemetry 3. Aortic valve stenosis due to bicuspid aortic valve, congenital, as above, needs cardiac surgical evaluation 4. Dental caries and paradontosis, needs to have his teeth pulled out by dentist in the hospital that was discussed with cardiologist    Management plans discussed with the patient, family and they are in agreement.   DRUG ALLERGIES: No Known Allergies  CODE STATUS:     Code Status Orders        Start     Ordered   11/21/14 0319  Full code   Continuous     11/21/14 0318      TOTAL TIME TAKING CARE OF THIS PATIENT:  40 minutes.   Discussed with cardiologist extensively Destenie Ingber M.D on 11/21/2014 at 10:19 AM  Between 7am to 6pm - Pager - [phone]  After 6pm go to [email or web address] - password [email or web address] [facility]  [facility]  Office  [phone]  CC: Primary care physician; Marykay Lex, MD

## 2014-11-21 NOTE — H&P (Signed)
Patient ID: Nicholas Horton MRN: [id number] DOB/AGE: 38-20-1977 38 y.o.  Admit date: 11/20/2014 Referring Physician: Sheryle Hail Primary Cardiologist: Herbie Baltimore Reason for Consultation: Chest pain, syncope, elevated troponin  HPI: 39 yo male with history of non-ischemic cardiomyopathy, bicuspid aortic valve with moderate aortic stenosis, transient high grade AV block, chronic combined systolic and diastolic CHF, former tobacco abuse who was admitted to [facility] early this am after a syncopal episode. He is followed in our [facility] office by Dr. Herbie Baltimore. He was admitted to [facility] May 8-11, 2016 with chest pain and complete heart block. He was taken emergently to the cath lab that day and had a temporary pacemaker placed. Cardiac cath 10/04/14 with minimal plaque (20%) in the mid LAD and no other disease noted. LVEF was noted to be 20-25% by LV gram. He was felt to have a non-ischemic cardiomyopathy. The conduction abnormality normalized. He was seen by EP (Dr. Ladona Ridgel) and no permanent pacemaker was arranged. Cardiac MRI 10/05/14 did not suggest infiltrative disease. LVEF noted to be 43%. He was diuresed and had symptomatic improvement. Troponin peaked at 0.19. Outpatient echo 11/20/14 with normal LV systolic function (LVEF=50-55%), moderately severe AS with mean gradient of 33 mmHg and peak gradient of 61 mmHg. He was last seen in the office by Dr. Herbie Baltimore 10/14/14.  Now admitted through the ED 11/21/14 to the hospitalist service after a syncopal event while playing cards. He states that he "blacked out" for at least a few seconds. He has noted abdominal fullness over the last few days but no dyspnea. No LE edema. His chest wall has been sore. He denies any palpitations or dizziness preceding the syncopal event. He is currently in sinus rhythm. He does have a long PR interval. Troponin is 0.38. Labs are otherwise within normal limits.    Past Medical History  Diagnosis Date  .  Congenital bicuspid aortic valve 09/2014  . Moderate aortic stenosis by prior echocardiogram 10/04/2014    ; possibly severe. Mean gradient and cardiac catheterization lab of 32 mmHg.  Marland Kitchen Nonischemic cardiomyopathy     EF ~20-30% by LV Gram, 43% by MRI  . Third degree heart block 10/04/2014    Transient CHB related to Acute Combined HF  . Chronic combined systolic and diastolic CHF (congestive heart failure) 09/2014    Family History  Problem Relation Age of Onset  . CAD Paternal Grandmother     History   Social History  . Marital Status: Single    Spouse Name: N/A  . Number of Children: N/A  . Years of Education: N/A   Occupational History  . 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    Social History Main Topics  . Smoking status: Former Smoker -- 2.00 packs/day for 20 years    Types: Cigarettes  . Smokeless tobacco: Never Used     Comment: quit 10/04/14  . Alcohol Use: 0.0 oz/week    0 Standard drinks or equivalent per week     Comment: rarely  . Drug Use: No  . Sexual Activity: Yes   Other Topics Concern  . Not on file   Social History Narrative   Lives in [place], [place] with fiance. No history of CAD or PPM, arrhythmia in parents or siblings.    Past Surgical History  Procedure Laterality Date  . Surgery scrotal / testicular      for undescended testicle as a child  . Cardiac catheterization N/A 10/04/2014    Procedure: Left Heart Cath and  Coronary Angiography; Surgeon: Marykay Lex, MD; Location: [facility] INVASIVE CV LAB; Service: Cardiovascular; Laterality: N/A;  . Cardiac catheterization  10/04/2014    Procedure: Temporary Pacemaker; Surgeon: Marykay Lex, MD; Location: [facility] INVASIVE CV LAB; Service: Cardiovascular;;    No Known Allergies   . aspirin EC 81 mg Oral Daily  . atorvastatin 40 mg Oral Daily  . docusate sodium 100 mg  Oral BID  . furosemide 40 mg Oral BID  . heparin 5,000 Units Subcutaneous 3 times per day  . lisinopril 5 mg Oral Daily  . sodium chloride 3 mL Intravenous Q12H  . spironolactone 12.5 mg Oral Daily    Prior to Admission medications   Medication Sig Start Date End Date Taking? Authorizing Provider  aspirin EC 81 MG EC tablet Take 1 tablet (81 mg total) by mouth daily. 10/07/14  Yes Ejiroghene E Emokpae, MD  atorvastatin (LIPITOR) 40 MG tablet Take 1 tablet (40 mg total) by mouth daily. 10/14/14  Yes Marykay Lex, MD  furosemide (LASIX) 40 MG tablet Take 1 tablet (40 mg total) by mouth 2 (two) times daily as needed. Patient taking differently: Take 40 mg by mouth 2 (two) times daily as needed for fluid or edema.  10/14/14  Yes Marykay Lex, MD  spironolactone (ALDACTONE) 25 MG tablet Take 0.5 tablets (12.5 mg total) by mouth daily. 10/14/14  Yes Marykay Lex, MD  amoxicillin (AMOXIL) 500 MG tablet Take 1 tablet (500 mg total) by mouth 3 (three) times daily. Patient not taking: Reported on 11/20/2014 10/14/14   Marykay Lex, MD  lisinopril (PRINIVIL,ZESTRIL) 5 MG tablet Take 1 tablet (5 mg total) by mouth daily. Patient not taking: Reported on 11/20/2014 10/07/14   Ejiroghene Wendall Stade, MD    Review of systems complete and found to be negative unless listed above    Physical Exam: Blood pressure 117/71, pulse 71, temperature 98.2 F (36.8 C), temperature source Oral, resp. rate 20, height 6\' 2"  (1.88 m), weight 218 lb 9.6 oz (99.156 kg), SpO2 94 %.   General: Well developed, well nourished, NAD  HEENT: OP clear, mucus membranes moist  SKIN: warm, dry. No rashes.  Neuro: No focal deficits  Musculoskeletal: Muscle strength 5/5 all ext  Psychiatric: Mood and affect normal  Neck: No JVD, no carotid bruits, no thyromegaly, no lymphadenopathy.  Lungs:Clear bilaterally, no wheezes, rhonci, crackles   Cardiovascular: Regular rate and rhythm. Loud systolic murmru. No gallops or rubs.  Abdomen:Soft. Bowel sounds present. Non-tender.  Extremities: No lower extremity edema. Pulses are 2 + in the bilateral DP/PT.  Labs:   Recent Labs    Lab Results  Component Value Date   WBC 11.9* 11/20/2014   HGB 13.5 11/20/2014   HCT 39.8* 11/20/2014   MCV 91.4 11/20/2014   PLT 143* 11/20/2014       Last Labs      Recent Labs Lab 11/20/14 2216  NA 140  K 3.7  CL 103  CO2 28  BUN 20  CREATININE 1.06  CALCIUM 9.6  PROT 7.6  BILITOT 0.6  ALKPHOS 66  ALT 27  AST 25  GLUCOSE 168*      Lab Results  Component Value Date             TROPONINI 0.38* 11/21/2014   Cardiac MRI 10/05/14: 1. Normal left ventricular size with mild LV hypertrophy. EF 43% with diffuse hypokinesis. 2. Normal RV size and systolic function. 3. Bicuspid aortic valve with at least moderate aortic  stenosis, would assess valve gradient by echo. 4. No definite myocardial LGE, so no definitive evidence for infiltrative disease, myocarditis, or prior myocardial infarction.  Echo 11/20/14: Left ventricle: The cavity size was mildly dilated. Systolic function was normal. The estimated ejection fraction was in the range of 50% to 55%. Wall motion was normal; there were no regional wall motion abnormalities. - Aortic valve: Transvalvular velocity was increased. There was moderate to severe stenosis. There was mild regurgitation. Peak velocity (S): 390 cm/s. Mean gradient (S): 33 mm Hg. Peak gradient (S): 61 mm Hg. - Mitral valve: There was mild regurgitation. - Left atrium: The atrium was mildly dilated. - Right ventricle: Systolic function was normal. - Pulmonary arteries: Systolic pressure was within the normal range.  Chest x-ray: 11/20/14: FINDINGS: The lungs are well-aerated. Peribronchial thickening is noted.  Mild vascular congestion is seen. Mild bibasilar atelectasis is noted. There is no evidence of pleural effusion or pneumothorax. The cardiomediastinal silhouette is within normal limits. No acute osseous abnormalities are seen. IMPRESSION: Peribronchial thickening noted. Mild vascular congestion seen. Mild bibasilar atelectasis noted.   EKG: Sinus with long 1st degree AV block. Rate 84bpm. Non-specific ST and T wave abnormalities.   ASSESSMENT AND PLAN:   1. Syncope 2. Elevated troponin 3. Known very minimal CAD by cath last month 4. Non-ischemic cardiomyopathy with recovery of LVEF by echo 11/20/14 5. Moderately severe aortic stenosis with bicuspid aortic valve 6. Recent complete heart block, now with sinus with 1st degree AV block 7. Poor dentition  His troponin is elevated following the syncopal event but I do not think this is an acute coronary syndrome. He has had no evidence of complete heart block. His syncopal event may be due to his aortic stenosis but cannot exclude transient high grade AV block. He has had a recent cardiac cath with minimal CAD. Echo yesterday with low normal LV function with moderately severe AS. He denies any illicit drug use or alcohol use last night. He is not volume overloaded. He is not on a beta blocker due to recent high grade AV block.   I have had a long discussion with the patient and his fiance this am. I think that he should be transferred to [facility] today to follow closely on telemetry. We would have the ability then to place a temporary pacemaker should he have recurrence of high grade AV block. We can also get CT surgery involved to discuss his aortic stenosis and possible AVR. He will need a dental consult at [facility] for dental extraction.     Signed: Verne Carrow, MD 11/21/2014, 10:20 AM

## 2014-11-21 NOTE — H&P (Signed)
Nicholas Horton is an 39 y.o. male.   Chief Complaint: Chest pain HPI: The patient presents emergency department after a 5-10 second episode of blacking out. The patient was sitting and playing cards when he briefly lost consciousness. When he awoke he was pale and diaphoretic and remembers feeling chest pain over his left breast. He became nauseous but did not vomit. He states the pain was a tightness in character. These symptoms were preceded by a few days of fullness in his abdomen which he says he usually swells with edema if he is having an exacerbation of heart failure. He also notes that he felt tightness in his chest at rest particularly at nighttime. At the time of my interview the patient still complained of tightness in his chest as well as some subjective shortness breath. Due to his recent history of NSTEMI with third-degree heart block the emergency department staff called for admission.  Past Medical History  Diagnosis Date  . Congenital bicuspid aortic valve 09/2014  . Moderate aortic stenosis by prior echocardiogram 10/04/2014    ; possibly severe. Mean gradient and cardiac catheterization lab of 32 mmHg.  Marland Kitchen Nonischemic cardiomyopathy     EF ~20-30% by LV Gram, 43% by MRI  . Third degree heart block 10/04/2014    Transient CHB related to Acute Combined HF  . Chronic combined systolic and diastolic CHF (congestive heart failure) 09/2014    Past Surgical History  Procedure Laterality Date  . Surgery scrotal / testicular      for undescended testicle as a child  . Cardiac catheterization N/A 10/04/2014    Procedure: Left Heart Cath and Coronary Angiography;  Surgeon: Leonie Man, MD;  Location: [facility] CV LAB;  Service: Cardiovascular;  Laterality: N/A;  . Cardiac catheterization  10/04/2014    Procedure: Temporary Pacemaker;  Surgeon: Leonie Man, MD;  Location: [place] CV LAB;  Service: Cardiovascular;;    Family History  Problem Relation Age of Onset  . CAD Paternal  Grandmother    Social History:  reports that he has quit smoking. His smoking use included Cigarettes. He has a 40 pack-year smoking history. He has never used smokeless tobacco. He reports that he drinks alcohol. He reports that he does not use illicit drugs.  Allergies: No Known Allergies  Prior to Admission medications   Medication Sig Start Date End Date Taking? Authorizing Provider  aspirin EC 81 MG EC tablet Take 1 tablet (81 mg total) by mouth daily. 10/07/14  Yes Ejiroghene E Emokpae, MD  atorvastatin (LIPITOR) 40 MG tablet Take 1 tablet (40 mg total) by mouth daily. 10/14/14  Yes Leonie Man, MD  furosemide (LASIX) 40 MG tablet Take 1 tablet (40 mg total) by mouth 2 (two) times daily as needed. Patient taking differently: Take 40 mg by mouth 2 (two) times daily as needed for fluid or edema.  10/14/14  Yes Leonie Man, MD  spironolactone (ALDACTONE) 25 MG tablet Take 0.5 tablets (12.5 mg total) by mouth daily. 10/14/14  Yes Leonie Man, MD  amoxicillin (AMOXIL) 500 MG tablet Take 1 tablet (500 mg total) by mouth 3 (three) times daily. Patient not taking: Reported on 11/20/2014 10/14/14   Leonie Man, MD  lisinopril (PRINIVIL,ZESTRIL) 5 MG tablet Take 1 tablet (5 mg total) by mouth daily. Patient not taking: Reported on 11/20/2014 10/07/14   Bethena Roys, MD     Results for orders placed or performed during the hospital encounter of 11/20/14 (from  the past 48 hour(s))  APTT     Status: None   Collection Time: 11/20/14 10:16 PM  Result Value Ref Range   aPTT 28 24 - 36 seconds  CBC     Status: Abnormal   Collection Time: 11/20/14 10:16 PM  Result Value Ref Range   WBC 11.9 (H) 3.8 - 10.6 K/uL   RBC 4.35 (L) 4.40 - 5.90 MIL/uL   Hemoglobin 13.5 13.0 - 18.0 g/dL   HCT 39.8 (L) 40.0 - 52.0 %   MCV 91.4 80.0 - 100.0 fL   MCH 31.0 26.0 - 34.0 pg   MCHC 34.0 32.0 - 36.0 g/dL   RDW 13.5 11.5 - 14.5 %   Platelets 143 (L) 150 - 440 K/uL  Comprehensive metabolic panel      Status: Abnormal   Collection Time: 11/20/14 10:16 PM  Result Value Ref Range   Sodium 140 135 - 145 mmol/L   Potassium 3.7 3.5 - 5.1 mmol/L   Chloride 103 101 - 111 mmol/L   CO2 28 22 - 32 mmol/L   Glucose, Bld 168 (H) 65 - 99 mg/dL   BUN 20 6 - 20 mg/dL   Creatinine, Ser 1.06 0.61 - 1.24 mg/dL   Calcium 9.6 8.9 - 10.3 mg/dL   Total Protein 7.6 6.5 - 8.1 g/dL   Albumin 4.3 3.5 - 5.0 g/dL   AST 25 15 - 41 U/L   ALT 27 17 - 63 U/L   Alkaline Phosphatase 66 38 - 126 U/L   Total Bilirubin 0.6 0.3 - 1.2 mg/dL   GFR calc non Af Amer >60 >60 mL/min   GFR calc Af Amer >60 >60 mL/min    Comment: (NOTE) The eGFR has been calculated using the CKD EPI equation. This calculation has not been validated in all clinical situations. eGFR's persistently <60 mL/min signify possible Chronic Kidney Disease.    Anion gap 9 5 - 15  Troponin I     Status: Abnormal   Collection Time: 11/20/14 10:16 PM  Result Value Ref Range   Troponin I 0.04 (H) <0.031 ng/mL    Comment: READ BACK AND VERIFIED WITH [facility] 11/21/14.[facility]        PERSISTENTLY INCREASED TROPONIN VALUES IN THE RANGE OF 0.04-0.49 ng/mL CAN BE SEEN IN:       -UNSTABLE ANGINA       -CONGESTIVE HEART FAILURE       -MYOCARDITIS       -CHEST TRAUMA       -ARRYHTHMIAS       -LATE PRESENTING MYOCARDIAL INFARCTION       -COPD   CLINICAL FOLLOW-UP RECOMMENDED.   Brain natriuretic peptide (order if patient c/o SOB ONLY)     Status: Abnormal   Collection Time: 11/20/14 10:17 PM  Result Value Ref Range   B Natriuretic Peptide 339.0 (H) 0.0 - 100.0 pg/mL  Urinalysis complete, with microscopic ([facility] only)     Status: Abnormal   Collection Time: 11/21/14 12:50 AM  Result Value Ref Range   Color, Urine YELLOW (A) YELLOW   APPearance HAZY (A) CLEAR   Glucose, UA NEGATIVE NEGATIVE mg/dL   Bilirubin Urine NEGATIVE NEGATIVE   Ketones, ur NEGATIVE NEGATIVE mg/dL   Specific Gravity, Urine 1.021 1.005 - 1.030   Hgb urine dipstick  NEGATIVE NEGATIVE   pH 6.0 5.0 - 8.0   Protein, ur 30 (A) NEGATIVE mg/dL   Nitrite POSITIVE (A) NEGATIVE   Leukocytes, UA NEGATIVE NEGATIVE  RBC / HPF 0-5 0 - 5 RBC/hpf   WBC, UA 6-30 0 - 5 WBC/hpf   Bacteria, UA MANY (A) NONE SEEN   Squamous Epithelial / LPF NONE SEEN NONE SEEN   Mucous PRESENT    Hyaline Casts, UA PRESENT   Troponin I     Status: Abnormal   Collection Time: 11/21/14  1:31 AM  Result Value Ref Range   Troponin I 0.08 (H) <0.031 ng/mL    Comment: RESULTS PREVIOUSLY CALLED BY [facility] AT 0110 ON 11/21/14 [facility]        PERSISTENTLY INCREASED TROPONIN VALUES IN THE RANGE OF 0.04-0.49 ng/mL CAN BE SEEN IN:       -UNSTABLE ANGINA       -CONGESTIVE HEART FAILURE       -MYOCARDITIS       -CHEST TRAUMA       -ARRYHTHMIAS       -LATE PRESENTING MYOCARDIAL INFARCTION       -COPD   CLINICAL FOLLOW-UP RECOMMENDED.    Dg Chest Portable 1 View  11/20/2014   CLINICAL DATA:  Acute onset of left-sided chest pain. Initial encounter.  EXAM: PORTABLE CHEST - 1 VIEW  COMPARISON:  Chest radiograph performed 10/03/2014  FINDINGS: The lungs are well-aerated. Peribronchial thickening is noted. Mild vascular congestion is seen. Mild bibasilar atelectasis is noted. There is no evidence of pleural effusion or pneumothorax.  The cardiomediastinal silhouette is within normal limits. No acute osseous abnormalities are seen.  IMPRESSION: Peribronchial thickening noted. Mild vascular congestion seen. Mild bibasilar atelectasis noted.   Electronically Signed   By: Garald Balding M.D.   On: 11/20/2014 22:33    Review of Systems  Constitutional: Positive for diaphoresis. Negative for fever and chills.  HENT: Negative for sore throat and tinnitus.   Eyes: Negative for blurred vision and redness.  Respiratory: Positive for shortness of breath. Negative for cough.   Cardiovascular: Positive for chest pain. Negative for palpitations, orthopnea and PND.  Gastrointestinal: Positive for nausea. Negative for  vomiting, abdominal pain and diarrhea.  Genitourinary: Negative for dysuria, urgency and frequency.  Musculoskeletal: Negative for myalgias and joint pain.  Skin: Negative for rash.       No lesions  Neurological: Negative for speech change, focal weakness and weakness.  Endo/Heme/Allergies: Does not bruise/bleed easily.       No temperature intolerance  Psychiatric/Behavioral: Negative for depression and suicidal ideas.    Blood pressure 121/77, pulse 81, temperature 98 F (36.7 C), temperature source Oral, resp. rate 14, height '6\' 2"'  (1.88 m), weight 98.431 kg (217 lb), SpO2 97 %. Physical Exam  Nursing note and vitals reviewed. Constitutional: He is oriented to person, place, and time. He appears well-developed and well-nourished. No distress.  HENT:  Head: Normocephalic and atraumatic.  Mouth/Throat: Oropharynx is clear and moist.  Eyes: Conjunctivae and EOM are normal. Pupils are equal, round, and reactive to light. No scleral icterus.  Neck: Normal range of motion. Neck supple. No JVD present. No tracheal deviation present. No thyromegaly present.  Cardiovascular: Normal rate, regular rhythm, S1 normal, S2 normal and intact distal pulses.  Exam reveals no gallop and no friction rub.   Murmur heard.  Decrescendo systolic murmur is present with a grade of 3/6  Heard best over aortic valve area  Respiratory: Effort normal and breath sounds normal. No respiratory distress. He has no wheezes. He has no rales. He exhibits no mass and no crepitus.    GI: Soft. Bowel sounds are normal. He  exhibits no distension. There is no tenderness.  Genitourinary:  Deferred  Musculoskeletal: Normal range of motion. He exhibits no edema.  Lymphadenopathy:    He has no cervical adenopathy.  Neurological: He is alert and oriented to person, place, and time. No cranial nerve deficit.  Skin: Skin is warm. No rash noted. He is diaphoretic. No erythema.  Psychiatric: He has a normal mood and affect.  His behavior is normal. Judgment and thought content normal.     Assessment/Plan Principal Problem:   1. Chest pain: Probably reproducible. The patient underwent an echocardiogram this morning which may have caused some bruising to the chest wall. The patient's cardiomyopathy is nonischemic which makes cardiac etiology less likely. We will continue to trend the patient's cardiac enzymes and obtain a cardiology consult in the morning. Active Problems:   2. Nonischemic cardiomyopathy: Stable. Continue Lasix as well as metoprolol and spironolactone. Continue lisinopril to event further remodeling of myocardium 3. Poor dentition: Scheduled for teeth extraction prior to aortic valve repair 4. DVT prophylaxis: Heparin 5. GI prophylaxis: None The patient is a full code. Time spent on admission orders and patient care possibly 35 minutes    Harrie Foreman 11/21/2014, 2:32 AM

## 2014-11-21 NOTE — Discharge Summary (Addendum)
[facility] - [place] at [facility]   PATIENT NAME: Nicholas Horton    MR#:  [id number]  DATE OF BIRTH:  Nov 28, 1975  DATE OF ADMISSION:  11/20/2014 ADMITTING PHYSICIAN: Arnaldo Natal, MD  DATE OF DISCHARGE: No discharge date for patient encounter.  PRIMARY CARE PHYSICIAN: HARDING, DAVID W, MD     ADMISSION DIAGNOSIS:  UTI (lower urinary tract infection) [N39.0] Acute exacerbation of CHF (congestive heart failure) [I50.9] Syncope, cardiogenic [R55] Ischemic chest pain [I20.9]  DISCHARGE DIAGNOSIS:  Principal Problem:   Chest pain Active Problems:   Elevated troponin   Syncope   Nonischemic cardiomyopathy   Paradentosis   SECONDARY DIAGNOSIS:   Past Medical History  Diagnosis Date  . Congenital bicuspid aortic valve 09/2014  . Moderate aortic stenosis by prior echocardiogram 10/04/2014    ; possibly severe. Mean gradient and cardiac catheterization lab of 32 mmHg.  Marland Kitchen Nonischemic cardiomyopathy     EF ~20-30% by LV Gram, 43% by MRI  . Third degree heart block 10/04/2014    Transient CHB related to Acute Combined HF  . Chronic combined systolic and diastolic CHF (congestive heart failure) 09/2014    .pro HOSPITAL COURSE:   Patient is 39 year old male with past medical history significant for history of recent diagnosis of congenital bicuspid  Aortic valve, aortic stenosis , cardiomyopathy with ejection fraction at 20%, nonischemic, status post cardiac catheterization approximately a month ago, also transient third-degree AV block who presents back to the hospital with complaints of chest pains as well as syncopal episode, also significant dyspnea, even with the slightest exertion. EEG showed first-degree AV block as well as bundle-branch block. His troponins were found to be mildly elevated. His x-ray revealed mild vascular congestion. He was found to have pyuria, however, denied in dysuria symptoms. Urine cultures are pending. Patient was seen by  cardiologist, who felt that patient's syncopal episodes could be due to aortic stenosis but also could not exclude transient high-grade AV block. She had echocardiogram done on 11/20/2014 which revealed low normal ejection fraction was moderately severe aortic stenosis. Her urologist. Discussed case with him as well as his fiance and decided to transfer him to [facility] for possible temporal pacemaker placement if he has recurrence of high-grade AV block as well as cardiothoracic surgeon involved to discuss aortic stenosis and possible aVR. He also felt that patient needs to be this consulted by dental specialist to possibly extract teeth. The patient was agreeable to above. Discussion by problem: 1. Elevated troponin, likely demand ischemia. Patient would benefit from a neurosurgical evaluation to address issue of aortic valve stenosis is likely contributing to patient's chest pains as well as shortness of breath as well as troponin elevation. She did have cardiac catheterization proximal months ago at [facility], which was in unremarkable for coronary artery disease, although patient's ejection fraction was found to be low at 20%, now on most recent echocardiogram. Her his ejection fraction was noted to be 50% 2. Syncope, questionable arrhythmia, cardiac block related. None seen on telemetry on the floor. The patient may benefit from pacemaker if he is noted to have a cardiac block on telemetry 3. Aortic valve stenosis due to bicuspid aortic valve, congenital, as above, needs cardiac surgical evaluation 4. Dental caries and paradontosis, needs to have his teeth pulled out by dentist in the hospital that was discussed with cardiologist 5. Pyuria ,  questionable urinary tract infection. Urine cultures are pending. Initiate antibiotics therapy if cultures are positive for more  than 100, 000 colony-forming unit of bacteria  DISCHARGE CONDITIONS:   Fair  CONSULTS OBTAINED:  Treatment Team:  Kathleene Hazel, MD  DRUG ALLERGIES:  No Known Allergies  DISCHARGE MEDICATIONS:   Current Discharge Medication List    CONTINUE these medications which have NOT CHANGED   Details  aspirin EC 81 MG EC tablet Take 1 tablet (81 mg total) by mouth daily. Qty: 30 tablet, Refills: 1    atorvastatin (LIPITOR) 40 MG tablet Take 1 tablet (40 mg total) by mouth daily. Qty: 30 tablet, Refills: 11    furosemide (LASIX) 40 MG tablet Take 1 tablet (40 mg total) by mouth 2 (two) times daily as needed. Qty: 60 tablet, Refills: 11    spironolactone (ALDACTONE) 25 MG tablet Take 0.5 tablets (12.5 mg total) by mouth daily. Qty: 15 tablet, Refills: 11    lisinopril (PRINIVIL,ZESTRIL) 5 MG tablet Take 1 tablet (5 mg total) by mouth daily. Qty: 30 tablet, Refills: 0      STOP taking these medications     amoxicillin (AMOXIL) 500 MG tablet          DISCHARGE INSTRUCTIONS:    Please follow-up with your primary care physician as well as her cardiologist as outpatient  If you experience worsening of your admission symptoms, develop shortness of breath, life threatening emergency, suicidal or homicidal thoughts you must seek medical attention immediately by calling 911 or calling your MD immediately  if symptoms less severe.  You Must read complete instructions/literature along with all the possible adverse reactions/side effects for all the Medicines you take and that have been prescribed to you. Take any new Medicines after you have completely understood and accept all the possible adverse reactions/side effects.   Please note  You were cared for by a hospitalist during your hospital stay. If you have any questions about your discharge medications or the care you received while you were in the hospital after you are discharged, you can call the unit and asked to speak with the hospitalist on call if the hospitalist that took care of you is not available. Once you are discharged, your primary care  physician will handle any further medical issues. Please note that NO REFILLS for any discharge medications will be authorized once you are discharged, as it is imperative that you return to your primary care physician (or establish a relationship with a primary care physician if you do not have one) for your aftercare needs so that they can reassess your need for medications and monitor your lab values.    Today   CHIEF COMPLAINT:   Chief Complaint  Patient presents with  . Near Syncope    HISTORY OF PRESENT ILLNESS:  Jakhari Olley  is a 39 y.o. male with a known history of nonischemic cardiomyopathy, bicuspid aortic valve with moderate aortic stenosis, transient high-grade AV block. Chronic combined systolic and diastolic CHF. Former tobacco abuse who presents to the hospital after syncopal episode.  Discussion by problem 1. Elevated troponin, likely demand ischemia. Patient will be seen by cardio surgery to address issue of aortic valve stenosis that is contributing to patient's chest pains as well as shortness of breath as well as troponin elevation.  2. Syncope, questionable arrhythmia, cardiac block related. None seen on telemetry on the floor. The patient may benefit from pacemaker if he is noted to have a recurrent cardiac block on telemetry 3. Aortic valve stenosis due to bicuspid aortic valve, congenital, as above, needs cardiac surgical evaluation  4. Dental caries and paradentosis, needs to have his teeth pulled out by dentist in the hospital that was discussed with cardiologist 5. Pyuria ,  questionable urinary tract infection. Urine cultures are pending. Initiate antibiotics therapy if cultures are positive for more than 100, 000 colony-forming unit of bacteria  VITAL SIGNS:  Blood pressure 121/64, pulse 65, temperature 98.2 F (36.8 C), temperature source Oral, resp. rate 20, height 6\' 2"  (1.88 m), weight 99.156 kg (218 lb 9.6 oz), SpO2 94 %.  I/O:   Intake/Output Summary  (Last 24 hours) at 11/21/14 1038 Last data filed at 11/21/14 0547  Gross per 24 hour  Intake      0 ml  Output    875 ml  Net   -875 ml    PHYSICAL EXAMINATION:  GENERAL:  39 y.o.-year-old patient lying in the bed with no acute distress.  EYES: Pupils equal, round, reactive to light and accommodation. No scleral icterus. Extraocular muscles intact.  HEENT: Head atraumatic, normocephalic. Oropharynx and nasopharynx clear.  NECK:  Supple, no jugular venous distention. No thyroid enlargement, no tenderness.  LUNGS: Normal breath sounds bilaterally, no wheezing, rales,rhonchi or crepitation. No use of accessory muscles of respiration.  CARDIOVASCULAR: S1, S2 normal. Loud systolic murmur, no rubs, or gallops.  ABDOMEN: Soft, non-tender, non-distended. Bowel sounds present. No organomegaly or mass.  EXTREMITIES: No pedal edema, cyanosis, or clubbing.  NEUROLOGIC: Cranial nerves II through XII are intact. Muscle strength 5/5 in all extremities. Sensation intact. Gait not checked.  PSYCHIATRIC: The patient is alert and oriented x 3.  SKIN: No obvious rash, lesion, or ulcer.   DATA REVIEW:   CBC  Recent Labs Lab 11/20/14 2216  WBC 11.9*  HGB 13.5  HCT 39.8*  PLT 143*    Chemistries   Recent Labs Lab 11/20/14 2216  NA 140  K 3.7  CL 103  CO2 28  GLUCOSE 168*  BUN 20  CREATININE 1.06  CALCIUM 9.6  AST 25  ALT 27  ALKPHOS 66  BILITOT 0.6    Cardiac Enzymes  Recent Labs Lab 11/21/14 0802  TROPONINI 0.38*    Microbiology Results  Results for orders placed or performed during the hospital encounter of 10/04/14  MRSA PCR Screening     Status: None   Collection Time: 10/04/14 10:00 AM  Result Value Ref Range Status   MRSA by PCR NEGATIVE NEGATIVE Final    Comment:        The GeneXpert MRSA Assay (FDA approved for NASAL specimens only), is one component of a comprehensive MRSA colonization surveillance program. It is not intended to diagnose MRSA infection nor  to guide or monitor treatment for MRSA infections.   Culture, Urine     Status: None   Collection Time: 10/04/14 10:29 AM  Result Value Ref Range Status   Specimen Description URINE, CLEAN CATCH  Final   Special Requests Immunocompromised  Final   Colony Count   Final    >=100,000 COLONIES/ML Performed at [facility]    Culture   Final    ESCHERICHIA COLI Note: Confirmed Extended Spectrum Beta-Lactamase Producer (ESBL) CRITICAL RESULT CALLED TO, READ BACK BY AND VERIFIED WITH: ERIN 5/11 AT 1610 BY [facility] Performed at [facility]    Report Status 10/07/2014 FINAL  Final   Organism ID, Bacteria ESCHERICHIA COLI  Final      Susceptibility   Escherichia coli - MIC*    AMPICILLIN >=32 RESISTANT Resistant     CEFAZOLIN >=64 RESISTANT  Resistant     CIPROFLOXACIN >=4 RESISTANT Resistant     GENTAMICIN <=1 SENSITIVE Sensitive     LEVOFLOXACIN >=8 RESISTANT Resistant     NITROFURANTOIN <=16 SENSITIVE Sensitive     TOBRAMYCIN <=1 SENSITIVE Sensitive     TRIMETH/SULFA >=320 RESISTANT Resistant     PIP/TAZO <=4 SENSITIVE Sensitive     IMIPENEM <=0.25 SENSITIVE Sensitive     * ESCHERICHIA COLI    RADIOLOGY:  Dg Chest Portable 1 View  11/20/2014   CLINICAL DATA:  Acute onset of left-sided chest pain. Initial encounter.  EXAM: PORTABLE CHEST - 1 VIEW  COMPARISON:  Chest radiograph performed 10/03/2014  FINDINGS: The lungs are well-aerated. Peribronchial thickening is noted. Mild vascular congestion is seen. Mild bibasilar atelectasis is noted. There is no evidence of pleural effusion or pneumothorax.  The cardiomediastinal silhouette is within normal limits. No acute osseous abnormalities are seen.  IMPRESSION: Peribronchial thickening noted. Mild vascular congestion seen. Mild bibasilar atelectasis noted.   Electronically Signed   By: Roanna Raider M.D.   On: 11/20/2014 22:33    EKG:   Orders placed or performed during the hospital encounter of 11/20/14  . ED EKG  . ED  EKG  . EKG 12-Lead  . EKG 12-Lead      Management plans discussed with the patient, family and they are in agreement.  CODE STATUS:     Code Status Orders        Start     Ordered   11/21/14 0319  Full code   Continuous     11/21/14 0318      TOTAL TIME TAKING CARE OF THIS PATIENT: 45 minutes.   PREPARATION OF DISCHARGE AND DISCUSSION OF THE CASE WITH CARDIOLOGY FOR APPROXIMATELY 15 MORE MINUTES  Abigal Choung M.D on 11/21/2014 at 10:38 AM  Between 7am to 6pm - Pager - [phone]  After 6pm go to [email or web address] - password [email or web address] [facility]  [place] [facility]  Office  [phone]  CC: Primary care physician; Marykay Lex, MD

## 2014-11-21 NOTE — ED Provider Notes (Signed)
[facility] Emergency Department Provider Note  ____________________________________________  Time seen: Approximately 12:55 AM  I have reviewed the triage vital signs and the nursing notes.   HISTORY  Chief Complaint Near Syncope    HPI Nicholas Horton is a 39 y.o. male with a recentdiagnosis of nonischemic cardiomyopathy possibly secondary to myocarditis.  He initially presented about a month ago with third-degree heart block and went to [facility].  He no longer has a pacemaker but has had issues with arrhythmia and is on medications for heart failure.  He and his wife describe a gradual onset of "just not feeling" over the last 3 days.  Tonight he had acute onset of left-sided chest pain, extreme diaphoresis, and "passing out ".  His wife reports that he slumped over in a chair while they were playing cards and did not respond for several minutes.  He is back to his baseline now except for persistent mild left-sided chest tightness.  He also describes shortness of breath while at rest that has been gradual in onset but worse today.  His stomach has been tight which reportedly is where he tends to hold his excess fluid.  He takes Lasix 40 mg twice a day but has only had one dose today.   Past Medical History  Diagnosis Date  . Congenital bicuspid aortic valve 09/2014  . Moderate aortic stenosis by prior echocardiogram 10/04/2014    ; possibly severe. Mean gradient and cardiac catheterization lab of 32 mmHg.  Marland Kitchen Nonischemic cardiomyopathy     EF ~20-30% by LV Gram, 43% by MRI  . Third degree heart block 10/04/2014    Transient CHB related to Acute Combined HF  . Chronic combined systolic and diastolic CHF (congestive heart failure) 09/2014    Patient Active Problem List   Diagnosis Date Noted  . Nonischemic cardiomyopathy 10/16/2014  . Mouth abscess 10/16/2014  . Tobacco use disorder 10/07/2014  . Poor dentition 10/07/2014  . Acute respiratory failure  10/07/2014  . Bicuspid aortic valve   . NSTEMI, initial episode of care 10/04/2014  . Third degree heart block 10/04/2014  . Hypoxia 10/04/2014    Class: Acute  . Acute combined systolic and diastolic heart failure, NYHA class 3 10/04/2014  . Dyspnea 10/04/2014  . Third degree AV block 10/04/2014  . Moderate aortic stenosis; possibly severe. Mean gradient and cardiac catheterization lab of 32 mmHg. 10/04/2014    Past Surgical History  Procedure Laterality Date  . Surgery scrotal / testicular      for undescended testicle as a child  . Cardiac catheterization N/A 10/04/2014    Procedure: Left Heart Cath and Coronary Angiography;  Surgeon: Marykay Lex, MD;  Location: [facility] INVASIVE CV LAB;  Service: Cardiovascular;  Laterality: N/A;  . Cardiac catheterization  10/04/2014    Procedure: Temporary Pacemaker;  Surgeon: Marykay Lex, MD;  Location: [facility] INVASIVE CV LAB;  Service: Cardiovascular;;    Current Outpatient Rx  Name  Route  Sig  Dispense  Refill  . aspirin EC 81 MG EC tablet   Oral   Take 1 tablet (81 mg total) by mouth daily.   30 tablet   1   . atorvastatin (LIPITOR) 40 MG tablet   Oral   Take 1 tablet (40 mg total) by mouth daily.   30 tablet   11   . furosemide (LASIX) 40 MG tablet   Oral   Take 1 tablet (40 mg total) by mouth 2 (two) times daily  as needed. Patient taking differently: Take 40 mg by mouth 2 (two) times daily as needed for fluid or edema.    60 tablet   11   . spironolactone (ALDACTONE) 25 MG tablet   Oral   Take 0.5 tablets (12.5 mg total) by mouth daily.   15 tablet   11   . amoxicillin (AMOXIL) 500 MG tablet   Oral   Take 1 tablet (500 mg total) by mouth 3 (three) times daily. Patient not taking: Reported on 11/20/2014   30 tablet   0   . lisinopril (PRINIVIL,ZESTRIL) 5 MG tablet   Oral   Take 1 tablet (5 mg total) by mouth daily. Patient not taking: Reported on 11/20/2014   30 tablet   0     Allergies Review of patient's  allergies indicates no known allergies.  No family history on file.  Social History History  Substance Use Topics  . Smoking status: Former Smoker -- 0.50 packs/day for 20 years    Types: Cigarettes  . Smokeless tobacco: Never Used     Comment: quit 10/04/14  . Alcohol Use: 0.0 oz/week    0 Standard drinks or equivalent per week     Comment: rarely    Review of Systems Constitutional: No fever/chills Eyes: No visual changes. ENT: No sore throat. Cardiovascular: Mild to moderate left-sided chest pressure. Respiratory: Shortness of breath at rest. Gastrointestinal: No abdominal pain.  No nausea, no vomiting.  No diarrhea.  No constipation. Genitourinary: Negative for dysuria. Musculoskeletal: Negative for back pain. Skin: Negative for rash. Neurological: Negative for headaches, focal weakness or numbness.  Syncopal episode earlier today  10-point ROS otherwise negative.  ____________________________________________   PHYSICAL EXAM:  VITAL SIGNS: ED Triage Vitals  Enc Vitals Group     BP 11/20/14 2207 125/79 mmHg     Pulse Rate 11/20/14 2207 85     Resp 11/20/14 2207 10     Temp 11/20/14 2207 98 F (36.7 C)     Temp Source 11/20/14 2207 Oral     SpO2 11/20/14 2207 94 %     Weight 11/20/14 2207 217 lb (98.431 kg)     Height 11/20/14 2207 6\' 2"  (1.88 m)     Head Cir --      Peak Flow --      Pain Score 11/20/14 2208 5     Pain Loc --      Pain Edu? --      Excl. in GC? --     Constitutional: Alert and oriented. Well appearing and in no acute distress. Eyes: Conjunctivae are normal. PERRL. EOMI. Head: Atraumatic.  Very poor dentition (chronic) Nose: No congestion/rhinnorhea. Mouth/Throat: Mucous membranes are moist.  Oropharynx non-erythematous. Neck: No stridor.   Cardiovascular: Normal rate, regular rhythm. Grossly normal heart sounds.  Good peripheral circulation. Respiratory: Normal respiratory effort.  No retractions. Lungs CTAB. Gastrointestinal: Soft and  nontender. No distention. No abdominal bruits. No CVA tenderness. Musculoskeletal: No lower extremity tenderness nor edema.  No joint effusions. Neurologic:  Normal speech and language. No gross focal neurologic deficits are appreciated. Speech is normal. Skin:  Skin is warm, dry and intact. No rash noted.   ____________________________________________   LABS (all labs ordered are listed, but only abnormal results are displayed)  Labs Reviewed  CBC - Abnormal; Notable for the following:    WBC 11.9 (*)    RBC 4.35 (*)    HCT 39.8 (*)    Platelets 143 (*)  All other components within normal limits  COMPREHENSIVE METABOLIC PANEL - Abnormal; Notable for the following:    Glucose, Bld 168 (*)    All other components within normal limits  BRAIN NATRIURETIC PEPTIDE - Abnormal; Notable for the following:    B Natriuretic Peptide 339.0 (*)    All other components within normal limits  URINALYSIS COMPLETEWITH MICROSCOPIC ([facility] ONLY) - Abnormal; Notable for the following:    Color, Urine YELLOW (*)    APPearance HAZY (*)    Protein, ur 30 (*)    Nitrite POSITIVE (*)    Bacteria, UA MANY (*)    All other components within normal limits  TROPONIN I - Abnormal; Notable for the following:    Troponin I 0.04 (*)    All other components within normal limits  APTT  I-STAT CHEM 8, ED  I-STAT TROPOININ, ED   ____________________________________________  EKG  ED ECG REPORT I, Betha Shadix, the attending physician, personally viewed and interpreted this ECG.   Date: 11/21/2014  EKG Time: 22:05  Rate: 89  Rhythm: normal sinus rhythm  Axis: RAD  Intervals:first-degree A-V block .  QTc prolongation at 523 ms  ST&T Change: Non-specific ST segment / T-wave changes, but no evidence of acute ischemia.  No significant changes from prior (10/14/2014) ____________________________________________  RADIOLOGY  Aura Camps, Tyneshia Stivers, personally viewed and evaluated these images as part of my  medical decision making.   Dg Chest Portable 1 View  11/20/2014   CLINICAL DATA:  Acute onset of left-sided chest pain. Initial encounter.  EXAM: PORTABLE CHEST - 1 VIEW  COMPARISON:  Chest radiograph performed 10/03/2014  FINDINGS: The lungs are well-aerated. Peribronchial thickening is noted. Mild vascular congestion is seen. Mild bibasilar atelectasis is noted. There is no evidence of pleural effusion or pneumothorax.  The cardiomediastinal silhouette is within normal limits. No acute osseous abnormalities are seen.  IMPRESSION: Peribronchial thickening noted. Mild vascular congestion seen. Mild bibasilar atelectasis noted.   Electronically Signed   By: Roanna Raider M.D.   On: 11/20/2014 22:33   ____________________________________________   PROCEDURES  Procedure(s) performed: None  Critical Care performed: No ____________________________________________   INITIAL IMPRESSION / ASSESSMENT AND PLAN / ED COURSE  Pertinent labs & imaging results that were available during my care of the patient were reviewed by me and considered in my medical decision making (see chart for details).  The description of the patient's symptoms are concerning for cardiogenic syncope.  ACS is unlikely given his age.  Pulmonary embolism is possible but less likely, I believe, then a dangerous arrhythmia leading to his syncope.  I discussed this possibility with Dr. Sheryle Hail, the admitting hospitalist, and he will decide upon further evaluation/treatment.  The patient's troponin is slightly elevated 0.04 which I believe is related to demand ischemia rather than ACS.  I will treat with 3 additional aspirin 81 mg but will not start heparin at this time.  I will also treat with a small dose of Lasix 20 mg IV given the pulmonary vascular congestion observed on chest x-ray and the patient's subjective shortness of breath earlier tonight.  I also sent the urine to the lab for culture and I am giving him 1 g of ceftriaxone  for his urinary tract infection The patient is in no current distress.  ____________________________________________  FINAL CLINICAL IMPRESSION(S) / ED DIAGNOSES  Final diagnoses:  Ischemic chest pain  Syncope, cardiogenic  Acute exacerbation of CHF (congestive heart failure)  UTI (lower urinary tract infection)  NEW MEDICATIONS STARTED DURING THIS VISIT:  New Prescriptions   No medications on file     Loleta Rose, MD 11/21/14 0130

## 2014-11-21 NOTE — Consult Note (Signed)
Patient ID: Nicholas Horton MRN: [id number] DOB/AGE: 1975/08/16 39 y.o.  Admit date: 11/20/2014 Referring Physician: Sheryle Hail Primary Cardiologist: Herbie Baltimore Reason for Consultation: Chest pain, syncope, elevated troponin  HPI: 39 yo male with history of non-ischemic cardiomyopathy, bicuspid aortic valve with moderate aortic stenosis, transient high grade AV block, chronic combined systolic and diastolic CHF, former tobacco abuse who was admitted to [facility] early this am after a syncopal episode. He is followed in our [facility] office by Dr. Herbie Baltimore. He was admitted to [facility] May 8-11, 2016 with chest pain and complete heart block. He was taken emergently to the cath lab that day and had a temporary pacemaker placed. Cardiac cath 10/04/14 with minimal plaque (20%) in the mid LAD and no other disease noted. LVEF was noted to be 20-25% by LV gram. He was felt to have a non-ischemic cardiomyopathy. The conduction abnormality normalized. He was seen by EP (Dr. Ladona Ridgel) and no permanent pacemaker was arranged. Cardiac MRI 10/05/14 did not suggest infiltrative disease. LVEF noted to be 43%. He was diuresed and had symptomatic improvement. Troponin peaked at 0.19. Outpatient echo 11/20/14 with normal LV systolic function (LVEF=50-55%), moderately severe AS with mean gradient of 33 mmHg and peak gradient of 61 mmHg. He was last seen in the office by Dr. Herbie Baltimore 10/14/14.  Now admitted through the ED 11/21/14 to the hospitalist service after a syncopal event while playing cards. He states that he "blacked out" for at least a few seconds. He has noted abdominal fullness over the last few days but no dyspnea. No LE edema. His chest wall has been sore. He denies any palpitations or dizziness preceding the syncopal event. He is currently in sinus rhythm. He does have a long PR interval. Troponin is 0.38. Labs are otherwise within normal limits.     Past Medical History  Diagnosis Date  . Congenital  bicuspid aortic valve 09/2014  . Moderate aortic stenosis by prior echocardiogram 10/04/2014    ; possibly severe. Mean gradient and cardiac catheterization lab of 32 mmHg.  Marland Kitchen Nonischemic cardiomyopathy     EF ~20-30% by LV Gram, 43% by MRI  . Third degree heart block 10/04/2014    Transient CHB related to Acute Combined HF  . Chronic combined systolic and diastolic CHF (congestive heart failure) 09/2014    Family History  Problem Relation Age of Onset  . CAD Paternal Grandmother     History   Social History  . Marital Status: Single    Spouse Name: N/A  . Number of Children: N/A  . Years of Education: N/A   Occupational History  . 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    Social History Main Topics  . Smoking status: Former Smoker -- 2.00 packs/day for 20 years    Types: Cigarettes  . Smokeless tobacco: Never Used     Comment: quit 10/04/14  . Alcohol Use: 0.0 oz/week    0 Standard drinks or equivalent per week     Comment: rarely  . Drug Use: No  . Sexual Activity: Yes   Other Topics Concern  . Not on file   Social History Narrative   Lives in [place], [place] with fiance. No history of CAD or PPM, arrhythmia in parents or siblings.    Past Surgical History  Procedure Laterality Date  . Surgery scrotal / testicular      for undescended testicle as a child  . Cardiac catheterization N/A 10/04/2014    Procedure: Left Heart Cath  and Coronary Angiography;  Surgeon: Marykay Lex, MD;  Location: [facility] INVASIVE CV LAB;  Service: Cardiovascular;  Laterality: N/A;  . Cardiac catheterization  10/04/2014    Procedure: Temporary Pacemaker;  Surgeon: Marykay Lex, MD;  Location: [facility] INVASIVE CV LAB;  Service: Cardiovascular;;    No Known Allergies   . aspirin EC  81 mg Oral Daily  . atorvastatin  40 mg Oral Daily  . docusate sodium  100 mg Oral BID  . furosemide  40 mg Oral BID  . heparin  5,000 Units Subcutaneous 3 times per day  . lisinopril  5 mg Oral Daily  . sodium chloride  3 mL  Intravenous Q12H  . spironolactone  12.5 mg Oral Daily    Prior to Admission medications   Medication Sig Start Date End Date Taking? Authorizing Provider  aspirin EC 81 MG EC tablet Take 1 tablet (81 mg total) by mouth daily. 10/07/14  Yes Ejiroghene E Emokpae, MD  atorvastatin (LIPITOR) 40 MG tablet Take 1 tablet (40 mg total) by mouth daily. 10/14/14  Yes Marykay Lex, MD  furosemide (LASIX) 40 MG tablet Take 1 tablet (40 mg total) by mouth 2 (two) times daily as needed. Patient taking differently: Take 40 mg by mouth 2 (two) times daily as needed for fluid or edema.  10/14/14  Yes Marykay Lex, MD  spironolactone (ALDACTONE) 25 MG tablet Take 0.5 tablets (12.5 mg total) by mouth daily. 10/14/14  Yes Marykay Lex, MD  amoxicillin (AMOXIL) 500 MG tablet Take 1 tablet (500 mg total) by mouth 3 (three) times daily. Patient not taking: Reported on 11/20/2014 10/14/14   Marykay Lex, MD  lisinopril (PRINIVIL,ZESTRIL) 5 MG tablet Take 1 tablet (5 mg total) by mouth daily. Patient not taking: Reported on 11/20/2014 10/07/14   Ejiroghene Wendall Stade, MD    Review of systems complete and found to be negative unless listed above    Physical Exam: Blood pressure 117/71, pulse 71, temperature 98.2 F (36.8 C), temperature source Oral, resp. rate 20, height  (1.88 m), weight 218 lb 9.6 oz (99.156 kg), SpO2 94 %.    General: Well developed, well nourished, NAD  HEENT: OP clear, mucus membranes moist  SKIN: warm, dry. No rashes.  Neuro: No focal deficits  Musculoskeletal: Muscle strength 5/5 all ext  Psychiatric: Mood and affect normal  Neck: No JVD, no carotid bruits, no thyromegaly, no lymphadenopathy.  Lungs:Clear bilaterally, no wheezes, rhonci, crackles  Cardiovascular: Regular rate and rhythm. Loud systolic murmru. No gallops or rubs.  Abdomen:Soft. Bowel sounds present. Non-tender.  Extremities: No lower extremity edema. Pulses are 2 + in the bilateral DP/PT.  Labs:   Lab  Results  Component Value Date   WBC 11.9* 11/20/2014   HGB 13.5 11/20/2014   HCT 39.8* 11/20/2014   MCV 91.4 11/20/2014   PLT 143* 11/20/2014     Recent Labs Lab 11/20/14 2216  NA 140  K 3.7  CL 103  CO2 28  BUN 20  CREATININE 1.06  CALCIUM 9.6  PROT 7.6  BILITOT 0.6  ALKPHOS 66  ALT 27  AST 25  GLUCOSE 168*    Lab Results  Component Value Date             TROPONINI 0.38* 11/21/2014    Cardiac MRI 10/05/14: 1. Normal left ventricular size with mild LV hypertrophy. EF 43% with diffuse hypokinesis. 2. Normal RV size and systolic function. 3. Bicuspid aortic valve with at least  moderate aortic stenosis, would assess valve gradient by echo. 4. No definite myocardial LGE, so no definitive evidence for infiltrative disease, myocarditis, or prior myocardial infarction.  Echo 11/20/14: Left ventricle: The cavity size was mildly dilated. Systolic function was normal. The estimated ejection fraction was in the range of 50% to 55%. Wall motion was normal; there were no regional wall motion abnormalities. - Aortic valve: Transvalvular velocity was increased. There was moderate to severe stenosis. There was mild regurgitation. Peak velocity (S): 390 cm/s. Mean gradient (S): 33 mm Hg. Peak gradient (S): 61 mm Hg. - Mitral valve: There was mild regurgitation. - Left atrium: The atrium was mildly dilated. - Right ventricle: Systolic function was normal. - Pulmonary arteries: Systolic pressure was within the normal range.   Chest x-ray: 11/20/14: FINDINGS: The lungs are well-aerated. Peribronchial thickening is noted. Mild vascular congestion is seen. Mild bibasilar atelectasis is noted. There is no evidence of pleural effusion or pneumothorax. The cardiomediastinal silhouette is within normal limits. No acute osseous abnormalities are seen. IMPRESSION: Peribronchial thickening noted. Mild vascular congestion seen. Mild bibasilar atelectasis  noted.   EKG: Sinus with long 1st degree AV block. Rate 84bpm. Non-specific ST and T wave abnormalities.   ASSESSMENT AND PLAN:   1. Syncope 2. Elevated troponin 3. Known very minimal CAD by cath last month 4. Non-ischemic cardiomyopathy with recovery of LVEF by echo 11/20/14 5. Moderately severe aortic stenosis with bicuspid aortic valve 6. Recent complete heart block, now with sinus with 1st degree AV block 7. Poor dentition  His troponin is elevated following the syncopal event but I do not think this is an acute coronary syndrome. He has had no evidence of complete heart block. His syncopal event may be due to his aortic stenosis but cannot exclude transient high grade AV block. He has had a recent cardiac cath with minimal CAD. Echo yesterday with low normal LV function with moderately severe AS. He denies any illicit drug use or alcohol use last night.  He is not volume overloaded. He is not on a beta blocker due to recent high grade AV block.   I have had a long discussion with the patient and his fiance this am. I think that he should be transferred to [facility] today to follow closely on telemetry. We would have the ability then to place a temporary pacemaker should he have recurrence of high grade AV block. We can also get CT surgery involved to discuss his aortic stenosis and possible AVR. He will need a dental consult at [facility] for dental extraction.     Signed: Verne Carrow, MD 11/21/2014, 10:20 AM

## 2014-11-22 DIAGNOSIS — I350 Nonrheumatic aortic (valve) stenosis: Secondary | ICD-10-CM

## 2014-11-22 DIAGNOSIS — Q231 Congenital insufficiency of aortic valve: Secondary | ICD-10-CM

## 2014-11-22 DIAGNOSIS — K088 Other specified disorders of teeth and supporting structures: Secondary | ICD-10-CM

## 2014-11-22 DIAGNOSIS — I429 Cardiomyopathy, unspecified: Secondary | ICD-10-CM

## 2014-11-22 LAB — CBC
HCT: 40 % (ref 39.0–52.0)
Hemoglobin: 13.3 g/dL (ref 13.0–17.0)
MCH: 30.4 pg (ref 26.0–34.0)
MCHC: 33.3 g/dL (ref 30.0–36.0)
MCV: 91.3 fL (ref 78.0–100.0)
PLATELETS: 153 10*3/uL (ref 150–400)
RBC: 4.38 MIL/uL (ref 4.22–5.81)
RDW: 13.2 % (ref 11.5–15.5)
WBC: 12.7 10*3/uL — ABNORMAL HIGH (ref 4.0–10.5)

## 2014-11-22 LAB — BASIC METABOLIC PANEL
ANION GAP: 10 (ref 5–15)
BUN: 21 mg/dL — AB (ref 6–20)
CHLORIDE: 102 mmol/L (ref 101–111)
CO2: 27 mmol/L (ref 22–32)
CREATININE: 0.96 mg/dL (ref 0.61–1.24)
Calcium: 9.7 mg/dL (ref 8.9–10.3)
GLUCOSE: 100 mg/dL — AB (ref 65–99)
POTASSIUM: 3.8 mmol/L (ref 3.5–5.1)
Sodium: 139 mmol/L (ref 135–145)

## 2014-11-22 LAB — LIPID PANEL
Cholesterol: 178 mg/dL (ref 0–200)
HDL: 31 mg/dL — ABNORMAL LOW (ref >40)
LDL Cholesterol: 89 mg/dL (ref 0–99)
TRIGLYCERIDES: 290 mg/dL — AB (ref <150)
Total CHOL/HDL Ratio: 5.7 RATIO
VLDL: 58 mg/dL — ABNORMAL HIGH (ref 0–40)

## 2014-11-22 NOTE — Progress Notes (Addendum)
SUBJECTIVE:  No complaints  OBJECTIVE:   Vitals:   Filed Vitals:   11/21/14 1600 11/21/14 2139 11/22/14 0554 11/22/14 0639  BP:  108/57 91/51 112/60  Pulse:  71 68 65  Temp:  97.6 F (36.4 C) 97.5 F (36.4 C)   TempSrc:  Oral Oral   Resp:  16 16   Height:  (1.88 m)     Weight: 218 lb (98.884 kg)  216 lb (97.977 kg)   SpO2:  97% 95%    I&O's:   Intake/Output Summary (Last 24 hours) at 11/22/14 0936 Last data filed at 11/22/14 0800  Gross per 24 hour  Intake      0 ml  Output    400 ml  Net   -400 ml   TELEMETRY: Reviewed telemetry pt in NSR with prolonged PR interval and occasional dropped beats    PHYSICAL EXAM General: Well developed, well nourished, in no acute distress Head: Eyes PERRLA, No xanthomas.   Normal cephalic and atramatic  Lungs:   Clear bilaterally to auscultation and percussion. Heart:  HRRR S1 S2 Pulses are 2+ & equal. 2/6 late peaking systolic murmur RUSB to LLSB Abdomen: Bowel sounds are positive, abdomen soft and non-tender without masses  Extremities:   No clubbing, cyanosis or edema.  DP +1 Neuro: Alert and oriented X 3. Psych:  Good affect, responds appropriately   LABS: Basic Metabolic Panel:  Recent Labs  11/91/47 2216 11/21/14 1603 11/22/14 0245  NA 140  --  139  K 3.7  --  3.8  CL 103  --  102  CO2 28  --  27  GLUCOSE 168*  --  100*  BUN 20  --  21*  CREATININE 1.06 0.99 0.96  CALCIUM 9.6  --  9.7   Liver Function Tests:  Recent Labs  11/20/14 2216  AST 25  ALT 27  ALKPHOS 66  BILITOT 0.6  PROT 7.6  ALBUMIN 4.3   No results for input(s): LIPASE, AMYLASE in the last 72 hours. CBC:  Recent Labs  11/21/14 1603 11/22/14 0245  WBC 14.3* 12.7*  HGB 13.7 13.3  HCT 40.4 40.0  MCV 91.0 91.3  PLT 157 153   Cardiac Enzymes:  Recent Labs  11/20/14 2216 11/21/14 0131 11/21/14 0802  TROPONINI 0.04* 0.08* 0.38*   BNP: Invalid input(s): POCBNP D-Dimer: No results for input(s): DDIMER in the last 72  hours. Hemoglobin A1C: No results for input(s): HGBA1C in the last 72 hours. Fasting Lipid Panel:  Recent Labs  11/22/14 0245  CHOL 178  HDL 31*  LDLCALC 89  TRIG 829*  CHOLHDL 5.7   Thyroid Function Tests:  Recent Labs  11/21/14 0149  TSH 3.104   Anemia Panel: No results for input(s): VITAMINB12, FOLATE, FERRITIN, TIBC, IRON, RETICCTPCT in the last 72 hours. Coag Panel:   No results found for: INR, PROTIME  RADIOLOGY: Dg Orthopantogram  11/21/2014   CLINICAL DATA:  Dental abscesses/ cavities.  EXAM: ORTHOPANTOGRAM/PANORAMIC  COMPARISON:  None.  FINDINGS: Blurring motion artifact obscures the teeth in the midline.  In the maxilla, the remaining left incisor and right canine do not have a cavity. In the mandible, the incisors and, right canine, and right medial premolar do not have cavities.  The rest of the remaining teeth have cavities ranging from mild to severe.  Along the residual segments of the right mandibular molar, there is periapical lucency with suspected reactive sclerosis in the adjacent mandible suggesting periapical abscess ; there is  sclerosis of the upper portion of the adjacent mandible which may be reactive. There is also periapical lucency associated with the left maxillary molars and premolars.  IMPRESSION: 1. Markedly severe dental cavities. Periapical lucency with surrounding reactive sclerosis associated with the remaining right mandibular molar fragments. Periapical lucency also associated with the remaining left maxillary molar and premolars.   Electronically Signed   By: Gaylyn Rong M.D.   On: 11/21/2014 18:29   Dg Chest Portable 1 View  11/20/2014   CLINICAL DATA:  Acute onset of left-sided chest pain. Initial encounter.  EXAM: PORTABLE CHEST - 1 VIEW  COMPARISON:  Chest radiograph performed 10/03/2014  FINDINGS: The lungs are well-aerated. Peribronchial thickening is noted. Mild vascular congestion is seen. Mild bibasilar atelectasis is noted. There  is no evidence of pleural effusion or pneumothorax.  The cardiomediastinal silhouette is within normal limits. No acute osseous abnormalities are seen.  IMPRESSION: Peribronchial thickening noted. Mild vascular congestion seen. Mild bibasilar atelectasis noted.   Electronically Signed   By: Roanna Raider M.D.   On: 11/20/2014 22:33    A/P:  1. Mod-Sev Ao stenosis/Bicuspid AoV: Tx from [facility]  following admission for syncope with mild trop elevation. Currently no complaints. Cont current meds. TCTS has seen and will get dental consult and plan AVR later next week.   2. NICM: Cath 10/04/14 w/ mild CAD, EF 20-25% by V gram @ that time, 43% by MRI and 50-55% by echo this admission. Cont PO lasix. Cont acei/spiro. No BB 2/2 h/o conduction abnormality.  3. Dental Caries & Peridontitis: Will obtain orthopantogram  4.  Syncope secondary to # 1 but also need to consider recurrent transient high grade AV block.  Will monitor on tele.    5.  Recent complete heart block in setting of syncope which has resolved.  Still with prolonged PR interval and occasional dropped beats.  Follow on Tele.  Suspect he will ultimately end up with PPM post AVR  6.  Nonobstructive ASCAD by cath 09/2014 on ASA and statin.    Quintella Reichert, MD  11/22/2014  9:36 AM

## 2014-11-22 NOTE — Consult Note (Signed)
Reason for Consult:Aortic stenosis Referring Physician: Dr. Leda Quail  Nicholas Horton is an 39 y.o. male.  HPI: Nicholas Horton is a 39 yo man who presents with a cc/o syncope.  He was admitted in May of this year with chest pain and complete heart block. Troponin was 0.19. Catheterization revealed nonobstructive plaque in the LAD and an EF of 20-25%. A cardiac MR later showed an EF of 43%. There was no evidence of infiltrative disease. His CHB resolved and he did not require a pacemaker.  Yesterday(6/24) he had a syncopal event at home and was admitted. He has partial memory of the events. He did not have any CP or SOB. His girlfriend was present. No seizure activity was noted. He was in SR on arrival. Troponin was 0.38  He is currently pain free.  He was diagnosed with an e coli UTI during his last admission. His UA showed 6-20 WBC. Repeat culture is pending.  Past Medical History  Diagnosis Date  . Congenital bicuspid aortic valve 09/2014  . Moderate aortic stenosis by prior echocardiogram 10/04/2014    ; possibly severe. Mean gradient and cardiac catheterization lab of 32 mmHg.  Marland Kitchen Nonischemic cardiomyopathy     EF ~20-30% by LV Gram, 43% by MRI  . Third degree heart block 10/04/2014    Transient CHB related to Acute Combined HF  . Chronic combined systolic and diastolic CHF (congestive heart failure) 09/2014    Past Surgical History  Procedure Laterality Date  . Surgery scrotal / testicular      for undescended testicle as a child  . Cardiac catheterization N/A 10/04/2014    Procedure: Left Heart Cath and Coronary Angiography;  Surgeon: Leonie Man, MD;  Location: [facility];  Service: Cardiovascular;  Laterality: N/A;  . Cardiac catheterization  10/04/2014    Procedure: Temporary Pacemaker;  Surgeon: Leonie Man, MD;  Location: [facility];  Service: Cardiovascular;;    Family History  Problem Relation Age of Onset  . CAD Paternal Grandmother     Social  History:  reports that he has quit smoking. His smoking use included Cigarettes. He has a 40 pack-year smoking history. He has never used smokeless tobacco. He reports that he drinks alcohol. He reports that he does not use illicit drugs.  Allergies: No Known Allergies  Medications:  Prior to Admission:  Prescriptions prior to admission  Medication Sig Dispense Refill Last Dose  . atorvastatin (LIPITOR) 40 MG tablet Take 1 tablet (40 mg total) by mouth daily. (Patient taking differently: Take 40 mg by mouth 2 (two) times daily. ) 30 tablet 11 11/21/2014 at Unknown time  . furosemide (LASIX) 40 MG tablet Take 1 tablet (40 mg total) by mouth 2 (two) times daily as needed. (Patient taking differently: Take 40 mg by mouth 2 (two) times daily as needed for fluid or edema. ) 60 tablet 11 11/21/2014 at Unknown time  . spironolactone (ALDACTONE) 25 MG tablet Take 0.5 tablets (12.5 mg total) by mouth daily. 15 tablet 11 11/21/2014 at Unknown time  . aspirin EC 81 MG EC tablet Take 1 tablet (81 mg total) by mouth daily. 30 tablet 1 11/20/2014 at Unknown time  . atorvastatin (LIPITOR) 80 MG tablet Take 80 mg by mouth daily at 6 PM.  1   . lisinopril (PRINIVIL,ZESTRIL) 5 MG tablet Take 1 tablet (5 mg total) by mouth daily. (Patient not taking: Reported on 11/20/2014) 30 tablet 0 Unknown at Unknown time  Results for orders placed or performed during the hospital encounter of 11/21/14 (from the past 48 hour(s))  CBC     Status: Abnormal   Collection Time: 11/21/14  4:03 PM  Result Value Ref Range   WBC 14.3 (H) 4.0 - 10.5 K/uL   RBC 4.44 4.22 - 5.81 MIL/uL   Hemoglobin 13.7 13.0 - 17.0 g/dL   HCT 40.4 39.0 - 52.0 %   MCV 91.0 78.0 - 100.0 fL   MCH 30.9 26.0 - 34.0 pg   MCHC 33.9 30.0 - 36.0 g/dL   RDW 13.2 11.5 - 15.5 %   Platelets 157 150 - 400 K/uL  Creatinine, serum     Status: None   Collection Time: 11/21/14  4:03 PM  Result Value Ref Range   Creatinine, Ser 0.99 0.61 - 1.24 mg/dL   GFR calc non  Af Amer >60 >60 mL/min   GFR calc Af Amer >60 >60 mL/min    Comment: (NOTE) The eGFR has been calculated using the CKD EPI equation. This calculation has not been validated in all clinical situations. eGFR's persistently <60 mL/min signify possible Chronic Kidney Disease.     Dg Orthopantogram  11/21/2014   CLINICAL DATA:  Dental abscesses/ cavities.  EXAM: ORTHOPANTOGRAM/PANORAMIC  COMPARISON:  None.  FINDINGS: Blurring motion artifact obscures the teeth in the midline.  In the maxilla, the remaining left incisor and right canine do not have a cavity. In the mandible, the incisors and, right canine, and right medial premolar do not have cavities.  The rest of the remaining teeth have cavities ranging from mild to severe.  Along the residual segments of the right mandibular molar, there is periapical lucency with suspected reactive sclerosis in the adjacent mandible suggesting periapical abscess ; there is sclerosis of the upper portion of the adjacent mandible which may be reactive. There is also periapical lucency associated with the left maxillary molars and premolars.  IMPRESSION: 1. Markedly severe dental cavities. Periapical lucency with surrounding reactive sclerosis associated with the remaining right mandibular molar fragments. Periapical lucency also associated with the remaining left maxillary molar and premolars.   Electronically Signed   By: Van Clines M.D.   On: 11/21/2014 18:29   Dg Chest Portable 1 View  11/20/2014   CLINICAL DATA:  Acute onset of left-sided chest pain. Initial encounter.  EXAM: PORTABLE CHEST - 1 VIEW  COMPARISON:  Chest radiograph performed 10/03/2014  FINDINGS: The lungs are well-aerated. Peribronchial thickening is noted. Mild vascular congestion is seen. Mild bibasilar atelectasis is noted. There is no evidence of pleural effusion or pneumothorax.  The cardiomediastinal silhouette is within normal limits. No acute osseous abnormalities are seen.   IMPRESSION: Peribronchial thickening noted. Mild vascular congestion seen. Mild bibasilar atelectasis noted.   Electronically Signed   By: Garald Balding M.D.   On: 11/20/2014 22:33    Review of Systems  Constitutional: Positive for malaise/fatigue.  HENT:       Loose and broken teeth  Cardiovascular: Positive for palpitations.  Genitourinary: Positive for dysuria.       Pyuria  Neurological: Positive for dizziness and loss of consciousness.  All other systems reviewed and are negative.  Blood pressure 108/57, pulse 71, temperature 97.6 F (36.4 C), temperature source Oral, resp. rate 16, height _0  (1.88 m), weight 218 lb (98.884 kg), SpO2 97 %. Physical Exam  Vitals reviewed. Constitutional: He is oriented to person, place, and time. He appears well-developed. No distress.  HENT:  Head: Normocephalic and  atraumatic.  Missing and chipped teeth, poor dentition  Eyes: EOM are normal. Pupils are equal, round, and reactive to light.  Neck: Neck supple. No thyromegaly present.  Cardiovascular: Normal rate, regular rhythm and intact distal pulses.   Murmur (2/6 systolic at RUSB) heard. Respiratory: Effort normal and breath sounds normal. He has no wheezes. He has no rales.  GI: Soft. There is no tenderness.  Musculoskeletal: He exhibits no edema.  Lymphadenopathy:    He has no cervical adenopathy.  Neurological: He is alert and oriented to person, place, and time. No cranial nerve deficit.  No motor weakness  Skin: Skin is warm and dry.   ECHO 11/20/14 Study Conclusions  - Left ventricle: The cavity size was mildly dilated. Systolic function was normal. The estimated ejection fraction was in the range of 50% to 55%. Wall motion was normal; there were no regional wall motion abnormalities. - Aortic valve: Transvalvular velocity was increased. There was moderate to severe stenosis. There was mild regurgitation. Peak velocity (S): 390 cm/s. Mean gradient (S): 33 mm Hg.  Peak gradient (S): 61 mm Hg. - Mitral valve: There was mild regurgitation. - Left atrium: The atrium was mildly dilated. - Right ventricle: Systolic function was normal. - Pulmonary arteries: Systolic pressure was within the normal range.  MR 10/05/14 IMPRESSION: 1. Normal left ventricular size with mild LV hypertrophy. EF 43% with diffuse hypokinesis.  2. Normal RV size and systolic function.  3. Bicuspid aortic valve with at least moderate aortic stenosis, would assess valve gradient by echo.  4. No definite myocardial LGE, so no definitive evidence for infiltrative disease, myocarditis, or prior myocardial infarction.  Loralie Champagne  CARDIAC CATHETERIZATION 10/04/14 1. Angiographically minimal CAD 2. Dilated cardiomyopathy with severely reduced EF of roughly 20-25%; global hypokinesis 3. Moderate if not Severe Aortic Stenosis 4. Acute Combined Systolic and Diastolic Heart Failure with Elevated EDP of 30-40 MmHg 5. Third-Degree AV block with accelerated junctional escape beats/rhythm (rates of roughly 40-70 bpm) 6. Successful Temporary Transient Replacement with backup rate of 40 bpm  Assessment/Plan: 39 yo man with a congenitally bicuspid aortic valve and moderately severe AS presents with a syncopal episode. He had been admitted last month with CP and had CHB noted. On arrival this admission he was in SR and no CHB has been noted on telemetry. It is unclear if this syncopal episode was rhythm related or directly related to his AS. In any event he has had 2 admissions within a month and at this point I think AVR is indicated.  I discussed the general nature of the procedure with him. He understands the need for general anesthesia, use of cardiopulmonary bypass, the expected hospital stay and overall recovery. We discussed the advantages and disadvantages of mechanical v tissue valves. At his age a mechanical valve would be preferred. He understands the need for lifelong  anticoagulation with a mechanical valve.  He needs a dental consultation and will definitely require extractions prior to surgery.  I will be away next week, but if he ready for surgery later in the week I will arrange for one of my partners to see him.  Melrose Nakayama 11/22/2014, 12:25 AM

## 2014-11-23 ENCOUNTER — Encounter (HOSPITAL_COMMUNITY): Payer: Self-pay | Admitting: Dentistry

## 2014-11-23 ENCOUNTER — Inpatient Hospital Stay (HOSPITAL_COMMUNITY): Payer: Self-pay

## 2014-11-23 ENCOUNTER — Other Ambulatory Visit: Payer: Self-pay | Admitting: *Deleted

## 2014-11-23 DIAGNOSIS — K083 Retained dental root: Secondary | ICD-10-CM

## 2014-11-23 DIAGNOSIS — K029 Dental caries, unspecified: Secondary | ICD-10-CM

## 2014-11-23 DIAGNOSIS — I350 Nonrheumatic aortic (valve) stenosis: Secondary | ICD-10-CM

## 2014-11-23 DIAGNOSIS — K045 Chronic apical periodontitis: Secondary | ICD-10-CM

## 2014-11-23 DIAGNOSIS — K054 Periodontosis: Secondary | ICD-10-CM

## 2014-11-23 DIAGNOSIS — R7881 Bacteremia: Secondary | ICD-10-CM

## 2014-11-23 DIAGNOSIS — I442 Atrioventricular block, complete: Secondary | ICD-10-CM

## 2014-11-23 LAB — URINE CULTURE
Culture: >=100000
Special Requests: NORMAL

## 2014-11-23 LAB — PULMONARY FUNCTION TEST
DL/VA % pred: 116 %
DL/VA: 5.57 ml/min/mmHg/L
DLCO cor % pred: 113 %
DLCO cor: 39.88 ml/min/mmHg
DLCO unc % pred: 110 %
DLCO unc: 38.83 ml/min/mmHg
FEF 25-75 Post: 4.64 L/sec
FEF 25-75 Pre: 2.91 L/sec
FEF2575-%Change-Post: 59 %
FEF2575-%PRED-PRE: 68 %
FEF2575-%Pred-Post: 109 %
FEV1-%Change-Post: 17 %
FEV1-%PRED-POST: 90 %
FEV1-%PRED-PRE: 76 %
FEV1-POST: 4.07 L
FEV1-PRE: 3.47 L
FEV1FVC-%CHANGE-POST: 9 %
FEV1FVC-%PRED-PRE: 91 %
FEV6-%CHANGE-POST: 9 %
FEV6-%PRED-PRE: 83 %
FEV6-%Pred-Post: 90 %
FEV6-Post: 5.04 L
FEV6-Pre: 4.61 L
FEV6FVC-%Change-Post: 0 %
FEV6FVC-%Pred-Post: 102 %
FEV6FVC-%Pred-Pre: 101 %
FVC-%CHANGE-POST: 6 %
FVC-%PRED-POST: 88 %
FVC-%Pred-Pre: 83 %
FVC-PRE: 4.73 L
FVC-Post: 5.04 L
POST FEV1/FVC RATIO: 81 %
PRE FEV6/FVC RATIO: 99 %
Post FEV6/FVC ratio: 100 %
Pre FEV1/FVC ratio: 73 %
RV % PRED: 113 %
RV: 2.16 L
TLC % PRED: 98 %
TLC: 7.23 L

## 2014-11-23 MED ORDER — ALBUTEROL SULFATE (2.5 MG/3ML) 0.083% IN NEBU
2.5000 mg | INHALATION_SOLUTION | Freq: Once | RESPIRATORY_TRACT | Status: AC
  Administered 2014-11-23: 2.5 mg via RESPIRATORY_TRACT

## 2014-11-23 MED ORDER — NITROFURANTOIN MONOHYD MACRO 100 MG PO CAPS
100.0000 mg | ORAL_CAPSULE | Freq: Two times a day (BID) | ORAL | Status: DC
  Administered 2014-11-23 – 2014-11-25 (×5): 100 mg via ORAL
  Filled 2014-11-23 (×8): qty 1

## 2014-11-23 NOTE — Progress Notes (Signed)
Patient: Nicholas Horton / Admit Date: 11/21/2014 / Date of Encounter: 11/23/2014, 8:56 AM   Subjective: Asymptomatic this morning. No CP or SOB.   Objective: Telemetry: NSR with long first degree AVB, occasional narrow complex tachycardia with loss of P wave activity, occasional dropped beats which appear concerning for Mobitz II, and this morning went into an AV dissociated wide complex ventricular escape rhythm/complete heart block Physical Exam: Blood pressure 103/54, pulse 59, temperature 97.6 F (36.4 C), temperature source Oral, resp. rate 19, height 6\' 2"  (1.88 m), weight 216 lb 9.6 oz (98.249 kg), SpO2 96 %. General: Well developed, well nourished WM, in no acute distress.  Head: Normocephalic, atraumatic, sclera non-icteric, no xanthomas, nares are without discharge. Poor dentition. Neck:  JVP not elevated. Lungs: Clear bilaterally to auscultation without wheezes, rales, or rhonchi. Breathing is unlabored. Heart: RRR. Loud systolic murmur over RUSB and entire precordium. No rubs or gallops.  Abdomen: Soft, non-tender, non-distended with normoactive bowel sounds. No rebound/guarding. Extremities: No clubbing or cyanosis. No edema. Distal pedal pulses are 2+ and equal bilaterally. Neuro: Alert and oriented X 3. Moves all extremities spontaneously. Psych:  Responds to questions appropriately with a normal affect.   Intake/Output Summary (Last 24 hours) at 11/23/14 0856 Last data filed at 11/23/14 0500  Gross per 24 hour  Intake      0 ml  Output   1650 ml  Net  -1650 ml    Inpatient Medications:  . aspirin EC  81 mg Oral Daily  . atorvastatin  40 mg Oral Daily  . furosemide  40 mg Oral BID  . heparin  5,000 Units Subcutaneous 3 times per day  . lisinopril  5 mg Oral Daily  . sodium chloride  3 mL Intravenous Q12H  . spironolactone  12.5 mg Oral Daily   Infusions:    Labs:  Recent Labs  11/20/14 2216 11/21/14 1603 11/22/14 0245  NA 140  --  139  K 3.7  --  3.8    CL 103  --  102  CO2 28  --  27  GLUCOSE 168*  --  100*  BUN 20  --  21*  CREATININE 1.06 0.99 0.96  CALCIUM 9.6  --  9.7    Recent Labs  11/20/14 2216  AST 25  ALT 27  ALKPHOS 66  BILITOT 0.6  PROT 7.6  ALBUMIN 4.3    Recent Labs  11/21/14 1603 11/22/14 0245  WBC 14.3* 12.7*  HGB 13.7 13.3  HCT 40.4 40.0  MCV 91.0 91.3  PLT 157 153    Recent Labs  11/20/14 2216 11/21/14 0131 11/21/14 0802  TROPONINI 0.04* 0.08* 0.38*   Invalid input(s): POCBNP No results for input(s): HGBA1C in the last 72 hours.   Radiology/Studies:  Dg Orthopantogram  11/21/2014   CLINICAL DATA:  Dental abscesses/ cavities.  EXAM: ORTHOPANTOGRAM/PANORAMIC  COMPARISON:  None.  FINDINGS: Blurring motion artifact obscures the teeth in the midline.  In the maxilla, the remaining left incisor and right canine do not have a cavity. In the mandible, the incisors and, right canine, and right medial premolar do not have cavities.  The rest of the remaining teeth have cavities ranging from mild to severe.  Along the residual segments of the right mandibular molar, there is periapical lucency with suspected reactive sclerosis in the adjacent mandible suggesting periapical abscess ; there is sclerosis of the upper portion of the adjacent mandible which may be reactive. There is also periapical lucency associated with the  left maxillary molars and premolars.  IMPRESSION: 1. Markedly severe dental cavities. Periapical lucency with surrounding reactive sclerosis associated with the remaining right mandibular molar fragments. Periapical lucency also associated with the remaining left maxillary molar and premolars.   Electronically Signed   By: Gaylyn Rong M.D.   On: 11/21/2014 18:29   Dg Chest Portable 1 View  11/20/2014   CLINICAL DATA:  Acute onset of left-sided chest pain. Initial encounter.  EXAM: PORTABLE CHEST - 1 VIEW  COMPARISON:  Chest radiograph performed 10/03/2014  FINDINGS: The lungs are  well-aerated. Peribronchial thickening is noted. Mild vascular congestion is seen. Mild bibasilar atelectasis is noted. There is no evidence of pleural effusion or pneumothorax.  The cardiomediastinal silhouette is within normal limits. No acute osseous abnormalities are seen.  IMPRESSION: Peribronchial thickening noted. Mild vascular congestion seen. Mild bibasilar atelectasis noted.   Electronically Signed   By: Roanna Raider M.D.   On: 11/20/2014 22:33     Assessment and Plan  53M with history of NICM, bicuspid AV with moderate AS, transient high grade AV block in 09/2014, chronic combined CHF admitted 11/21/14 with syncope and mildly elevated troponin.  1. Moderate to severe aortic stenosis with bicuspid aortic valve - Tx from [facility] following admission for syncope with mild trop elevation.Currently no complaints.Continue current meds. TCTS has seen and will get dental consult and plan AVR later next week. I called Dr. Luretha Murphy office this morning to request the consult.   2. Syncope - initially felt 2/2 #1 but now concerned for bradyarrhythmia due to evidence of CHB on telemetry this morning with ventricular escape of 45bpm. Have asked EP to revisit issue of pacemaker. If he has worsening bradycardia will need transfer to unit but for now he is completely asymptomatic.  3. Recent CHB 09/2014 with recurrence this admission - not on any AVN blocking agents. TSH normal. Currently asymptomatic. Will keep NPO from here on until EP sees.   4. NICM: Cath 10/04/14 w/ minimal CAD, EF 20-25% by V gram @ that time, 43% by MRI and 50-55% by echo this admission. Cont PO lasix.Cont ACEI/spiro.No BB 2/2 h/o conduction abnormality.  5. Nonobstructive ASCAD by cath 09/2014 - on ASA and statin. Troponin felt likely demand ischemia in the process that lead to syncope.  6. Dental Caries & Peridontitis:Orthopantogram done with markedly severe dental caries. Await Dr. Luretha Murphy input.  7. Persistent  leukocytosis - urine culture in 09/2014 showed E. Coli, did not appear to be treated in interim. Senstivities at that time were resistant to many organisms but sensitive to nitrofurantoin, gentamicin, imipenem, pip/tazo, tobramycin. Sensitivities this admission pending but suspect same organism. Will initiate nitrofurantoin  BID x 10 days.  Signed, Ronie Spies PA-C Pager: [phone]   Patient seen and examined and history reviewed. Agree with above findings and plan. Patient admitted with recurrent syncope. Review of telemetry shows NSR with a long first degree AV block, intermittent second degree AV block (dropped beats without PR prolongation), and CHB with AV dissociation and wide complex escape rhythm with rate 45 bpm. Will need EP to see again. Given recurrent syncope I don't see how we can avoid a pacemaker but will need to consider timing given need for dental extractions and AVR. AVR will not improve his conduction disease. Will treat UTI as noted above.   Peter [place], MDFACC 11/23/2014 10:08 AM

## 2014-11-23 NOTE — Progress Notes (Signed)
Utilization review completed.  

## 2014-11-23 NOTE — Consult Note (Signed)
Reason for Consult:   Syncope, complete heart block  Requesting Physician: Dr Herbie Baltimore Primary Cardiologist Dr Herbie Baltimore  HPI: This is a 39 y.o. male with a past medical history significant for NSTEMI May 2016. Cath then reveled minor CAD and severe LVD with an EF of 20-25%. He was also noted to have transient CHB and a bicuspid AOV with moderate to severe AS. He was treated with a T-TVDP then till his rhythm stabilized. The conduction abnormality normalized. He was seen by EP (Dr. Ladona Ridgel) and it was felt pemanent pacemaker was not indicated then. Cardiac MRI 10/05/14 did not suggest infiltrative disease. LVEF noted to be 43% by echo. He was diuresed and had symptomatic improvement. Troponin peaked at 0.19. Outpatient echo 11/20/14 showed normal LV systolic function (LVEF=50-55%), moderately severe AS with mean gradient of 33 mmHg and peak gradient of 61 mmHg. He was last seen in the office by Dr. Herbie Baltimore 10/14/14.  He was admitted through the ED 11/21/14 to the hospitalist service after a syncopal event while playing cards. He states that he "blacked out" for at least a few seconds. He has noted abdominal fullness over the last few days but no dyspnea. No LE edema. His chest wall has been sore. He denies any palpitations or dizziness preceding the syncopal event. He is currently in sinus rhythm. He was noted to have a long PR interval. Troponin is 0.38. Since admission he was seen by Dr Dorris Fetch who felt AVR was indicated but that the pt would need dental extraction prior to this.   Today telemetry revealed NSR with long first degree AVB, occasional narrow complex tachycardia with loss of P wave activity, occasional dropped beats which appear concerning for Mobitz II, and AV dissociated wide complex ventricular escape rhythm c/w complete heart block. Dr Graciela Husbands and the EP service are now asked to evaluate.    PMHx:  Past Medical History  Diagnosis Date  . Congenital bicuspid aortic valve  09/2014  . Moderate aortic stenosis by prior echocardiogram 10/04/2014    ; possibly severe. Mean gradient and cardiac catheterization lab of 32 mmHg.  Marland Kitchen Nonischemic cardiomyopathy     EF ~20-30% by LV Gram, 43% by MRI  . Third degree heart block 10/04/2014    Transient CHB related to Acute Combined HF  . Chronic combined systolic and diastolic CHF (congestive heart failure) 09/2014    Past Surgical History  Procedure Laterality Date  . Surgery scrotal / testicular      for undescended testicle as a child  . Cardiac catheterization N/A 10/04/2014    Procedure: Left Heart Cath and Coronary Angiography;  Surgeon: Marykay Lex, MD;  Location: [facility] INVASIVE CV LAB;  Service: Cardiovascular;  Laterality: N/A;  . Cardiac catheterization  10/04/2014    Procedure: Temporary Pacemaker;  Surgeon: Marykay Lex, MD;  Location: [facility] INVASIVE CV LAB;  Service: Cardiovascular;;    SOCHx:  reports that he has quit smoking. His smoking use included Cigarettes. He has a 40 pack-year smoking history. He has never used smokeless tobacco. He reports that he drinks alcohol. He reports that he does not use illicit drugs.  FAMHx: Family History  Problem Relation Age of Onset  . CAD Paternal Grandmother     ALLERGIES: No Known Allergies  ROS: Pertinent items are noted in HPI. see H&P for complete ROS  HOME MEDICATIONS: Prior to Admission medications   Medication Sig Start Date End Date Taking? Authorizing Provider  atorvastatin (  LIPITOR) 40 MG tablet Take 1 tablet (40 mg total) by mouth daily. Patient taking differently: Take 40 mg by mouth 2 (two) times daily.  10/14/14  Yes Marykay Lex, MD  furosemide (LASIX) 40 MG tablet Take 1 tablet (40 mg total) by mouth 2 (two) times daily as needed. Patient taking differently: Take 40 mg by mouth 2 (two) times daily as needed for fluid or edema.  10/14/14  Yes Marykay Lex, MD  spironolactone (ALDACTONE) 25 MG tablet Take 0.5 tablets (12.5 mg total) by mouth  daily. 10/14/14  Yes Marykay Lex, MD  aspirin EC 81 MG EC tablet Take 1 tablet (81 mg total) by mouth daily. 10/07/14   Ejiroghene Wendall Stade, MD  atorvastatin (LIPITOR) 80 MG tablet Take 80 mg by mouth daily at 6 PM. 10/07/14   Historical Provider, MD  lisinopril (PRINIVIL,ZESTRIL) 5 MG tablet Take 1 tablet (5 mg total) by mouth daily. Patient not taking: Reported on 11/20/2014 10/07/14   Ejiroghene Wendall Stade, MD    HOSPITAL MEDICATIONS: I have reviewed the patient's current medications.  VITALS: Blood pressure 103/54, pulse 59, temperature 97.6 F (36.4 C), temperature source Oral, resp. rate 19, height  (1.88 m), weight 216 lb 9.6 oz (98.249 kg), SpO2 96 %.  PHYSICAL EXAM: General appearance: alert, cooperative, no distress and poor dentition Neck: no JVD and transmitted murmur Lungs: clear to auscultation bilaterally Heart: regular rate and rhythm and 2/6 systolic murmur, diminnished S2 Abdomen: soft, non-tender; bowel sounds normal; no masses,  no organomegaly Extremities: extremities normal, atraumatic, no cyanosis or edema Pulses: 2+ and symmetric Skin: Skin color, texture, turgor normal. No rashes or lesions Neurologic: Grossly normal  LABS: No results found for this or any previous visit (from the past 24 hour(s)).  EKG: Sinus rhythm with complete heart block and Wide QRS rhythm. Rightward axis, Left bundle branch block  IMAGING: Dg Orthopantogram  11/21/2014   CLINICAL DATA:  Dental abscesses/ cavities.  EXAM: ORTHOPANTOGRAM/PANORAMIC  COMPARISON:  None.  FINDINGS: Blurring motion artifact obscures the teeth in the midline.  In the maxilla, the remaining left incisor and right canine do not have a cavity. In the mandible, the incisors and, right canine, and right medial premolar do not have cavities.  The rest of the remaining teeth have cavities ranging from mild to severe.  Along the residual segments of the right mandibular molar, there is periapical lucency with  suspected reactive sclerosis in the adjacent mandible suggesting periapical abscess ; there is sclerosis of the upper portion of the adjacent mandible which may be reactive. There is also periapical lucency associated with the left maxillary molars and premolars.  IMPRESSION: 1. Markedly severe dental cavities. Periapical lucency with surrounding reactive sclerosis associated with the remaining right mandibular molar fragments. Periapical lucency also associated with the remaining left maxillary molar and premolars.   Electronically Signed   By: Gaylyn Rong M.D.   On: 11/21/2014 18:29    IMPRESSION: Principal Problem:   Syncope Active Problems:   Moderate to severe AS with bicuspid valve. Mean 33, peak 61 mmHg.   Complete heart block   Poor dentition   Nonischemic cardiomyopathy-EF 50-55% echo 11/20/14   Elevated troponin   Paradentosis   Acute on chronic combined systolic and diastolic CHF    E. Coli UTI   Bicuspid aortic valve   Tobacco use disorder   RECOMMENDATION: Will review with Dr Graciela Husbands. Consider T-TVDP prior to dental work. Timing of P-TVDP likely to be post  AVR.   Time Spent Directly with Patient: 45 minutes  KILROY,LUKE K [phone] beeper 11/23/2014, 2:05 PM   Pt syncope NOT consistent with arrhythmic syncope with protracted prodrome and prolonged LOC.    Having said this, however, very broad nature of his escape beats raising is lasted was about the stability of this escape.  In the interim step would be the use of a temporary permanent pacemaker. Bacteremia associated with dental extraction is affecting lead would be mitigated by the removal of the lead at the time of his aortic valve replacement at which time epicardial wires to be placed. Transvenous permanent pacing is indicated, however, until the issue of bacteremia is resolved it should be delayed.  We can discuss this with anesthesia and surgery in the morning.

## 2014-11-23 NOTE — Consult Note (Signed)
DENTAL CONSULTATION  Date of Consultation:  11/23/2014 Patient Name:   Nicholas Horton Date of Birth:   1975-09-12 Medical Record Number: [id number]  VITALS: BP 103/54 mmHg  Pulse 59  Temp(Src) 97.6 F (36.4 C) (Oral)  Resp 19  Ht  (1.88 m)  Wt 216 lb 9.6 oz (98.249 kg)  BMI 27.80 kg/m2  SpO2 96%  CHIEF COMPLAINT: Patient referred by Dr. Mayford Knife for a dental consultation.  HPI: Nicholas Horton is a 39 year old male recently diagnosed with severe aortic stenosis. Patient with anticipated aortic valve replacement. Patient is now seen as part of a medically necessary pre-heart valve surgery dental protocol examination.  The patient currently denies acute toothaches, swellings, or abscesses. Patient indicates that he last had some dental pain approximately 2-3 weeks ago. Patient has not seen a dentist for over 20 years. Patient is interested in having all remaining teeth extracted at this time.  PROBLEM LIST: Patient Active Problem List   Diagnosis Date Noted  . Complete heart block 11/23/2014  . Chronic combined systolic and diastolic CHF (congestive heart failure) 11/22/2014  . Chest pain 11/21/2014  . Elevated troponin 11/21/2014  . Syncope 11/21/2014  . Paradentosis 11/21/2014  . Nonischemic cardiomyopathy 10/16/2014  . Mouth abscess 10/16/2014  . Tobacco use disorder 10/07/2014  . Poor dentition 10/07/2014  . Acute respiratory failure 10/07/2014  . Bicuspid aortic valve   . NSTEMI, initial episode of care 10/04/2014  . Third degree heart block 10/04/2014  . Hypoxia 10/04/2014    Class: Acute  . Acute combined systolic and diastolic heart failure, NYHA class 3 10/04/2014  . Dyspnea 10/04/2014  . Third degree AV block 10/04/2014  . Moderate aortic stenosis; possibly severe. Mean gradient and cardiac catheterization lab of 32 mmHg. 10/04/2014    PMH: Past Medical History  Diagnosis Date  . Congenital bicuspid aortic valve 09/2014  . Moderate aortic stenosis by  prior echocardiogram 10/04/2014    ; possibly severe. Mean gradient and cardiac catheterization lab of 32 mmHg.  Marland Kitchen Nonischemic cardiomyopathy     EF ~20-30% by LV Gram, 43% by MRI  . Third degree heart block 10/04/2014    Transient CHB related to Acute Combined HF  . Chronic combined systolic and diastolic CHF (congestive heart failure) 09/2014    PSH: Past Surgical History  Procedure Laterality Date  . Surgery scrotal / testicular      for undescended testicle as a child  . Cardiac catheterization N/A 10/04/2014    Procedure: Left Heart Cath and Coronary Angiography;  Surgeon: Marykay Lex, MD;  Location: [facility] INVASIVE CV LAB;  Service: Cardiovascular;  Laterality: N/A;  . Cardiac catheterization  10/04/2014    Procedure: Temporary Pacemaker;  Surgeon: Marykay Lex, MD;  Location: [facility] INVASIVE CV LAB;  Service: Cardiovascular;;    ALLERGIES: No Known Allergies  MEDICATIONS: Current Facility-Administered Medications  Medication Dose Route Frequency Provider Last Rate Last Dose  . 0.9 %  sodium chloride infusion  250 mL Intravenous PRN Ok Anis, NP      . acetaminophen (TYLENOL) tablet 650 mg  650 mg Oral Q4H PRN Ok Anis, NP      . aspirin EC tablet 81 mg  81 mg Oral Daily Ok Anis, NP   81 mg at 11/23/14 1047  . atorvastatin (LIPITOR) tablet 40 mg  40 mg Oral Daily Ok Anis, NP   40 mg at 11/23/14 1047  . furosemide (LASIX) tablet 40 mg  40 mg Oral BID  Ok Anis, NP   40 mg at 11/23/14 1047  . heparin injection 5,000 Units  5,000 Units Subcutaneous 3 times per day Ok Anis, NP   5,000 Units at 11/23/14 0514  . lisinopril (PRINIVIL,ZESTRIL) tablet 5 mg  5 mg Oral Daily Ok Anis, NP   5 mg at 11/23/14 1047  . nitrofurantoin (macrocrystal-monohydrate) (MACROBID) capsule 100 mg  100 mg Oral Q12H Dayna N Dunn, PA-C   100 mg at 11/23/14 1047  . ondansetron (ZOFRAN) injection 4 mg  4 mg Intravenous Q6H PRN Ok Anis, NP      . sodium chloride 0.9 % injection 3 mL  3 mL Intravenous Q12H Ok Anis, NP      . sodium chloride 0.9 % injection 3 mL  3 mL Intravenous PRN Ok Anis, NP      . spironolactone (ALDACTONE) tablet 12.5 mg  12.5 mg Oral Daily Ok Anis, NP   12.5 mg at 11/23/14 1047    LABS: Lab Results  Component Value Date   WBC 12.7* 11/22/2014   HGB 13.3 11/22/2014   HCT 40.0 11/22/2014   MCV 91.3 11/22/2014   PLT 153 11/22/2014      Component Value Date/Time   NA 139 11/22/2014 0245   K 3.8 11/22/2014 0245   CL 102 11/22/2014 0245   CO2 27 11/22/2014 0245   GLUCOSE 100* 11/22/2014 0245   BUN 21* 11/22/2014 0245   CREATININE 0.96 11/22/2014 0245   CALCIUM 9.7 11/22/2014 0245   GFRNONAA >60 11/22/2014 0245   GFRAA >60 11/22/2014 0245   No results found for: INR, PROTIME No results found for: PTT  SOCIAL HISTORY: History   Social History  . Marital Status: Single    Spouse Name: N/A  . Number of Children: N/A  . Years of Education: N/A   Occupational History  . 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    Social History Main Topics  . Smoking status: Former Smoker -- 2.00 packs/day for 20 years    Types: Cigarettes  . Smokeless tobacco: Never Used     Comment: quit 10/04/14  . Alcohol Use: 0.0 oz/week    0 Standard drinks or equivalent per week     Comment: rarely  . Drug Use: No  . Sexual Activity: Yes   Other Topics Concern  . Not on file   Social History Narrative   Lives in [place], [place] with fiance. No history of CAD or PPM, arrhythmia in parents or siblings.    FAMILY HISTORY: Family History  Problem Relation Age of Onset  . CAD Paternal Grandmother     REVIEW OF SYSTEMS: Reviewed from chart for this admission.  DENTAL HISTORY: CHIEF COMPLAINT: Patient referred by Dr. Mayford Knife for a dental consultation.  HPI: Nicholas Horton is a 39 year old male recently diagnosed with severe aortic stenosis. Patient with anticipated  aortic valve replacement. Patient is now seen as part of a medically necessary pre-heart valve surgery dental protocol examination.  The patient currently denies acute toothaches, swellings, or abscesses. Patient indicates that he last had some dental pain approximately 2-3 weeks ago. Patient has not seen a dentist for over 20 years. Patient is interested in having all remaining teeth extracted at this time.  DENTAL EXAMINATION: GENERAL: Patient is a well-developed, well-nourished male in no acute distress. HEAD AND NECK: There is no palpable submandibular lymphadenopathy. The patient denies acute TMJ symptoms. Patient has acceptable maximum interincisal opening of 35 mm plus. INTRAORAL  EXAM: Patient has normal saliva. I do not any evidence of oral abscess formation. DENTITION: The patient has multiple missing teeth. The patient has multiple retained root segments. PERIODONTAL: Patient has chronic periodontitis with plaque and calculus accumulations, generalized gingival recession, and generalized tooth mobility. DENTAL CARIES/SUBOPTIMAL RESTORATIONS: Patient has rampant dental caries affecting all remaining teeth. ENDODONTIC: Patient currently denies acute pulpitis symptoms. Patient has had a history of previous toothache's. Patient has multiple areas of periapical pathology and radiolucency. CROWN AND BRIDGE: There are no crown or bridge restorations. PROSTHODONTIC: Patient denies having any partial dentures. OCCLUSION: Patient has a poor occlusal scheme secondary to multiple missing teeth, multiple retained root segments, and lack of replacement of missing teeth with dental prostheses.  RADIOGRAPHIC INTERPRETATION: An orthopantogram was taken on 11/21/2014. There are multiple missing teeth. There are multiple retained root segments. There are multiple areas of periapical pathology and radiolucency. Rampant dental caries are noted. There is supra-eruption and drifting of the unopposed teeth into  the edentulous areas. Patient has a malocclusion.  ASSESSMENTS: 1. Severe aortic stenosis 2. Preaortic valve replacement dental protocol 3. Chronic apical periodontitis 4. History of acute pulpitis 5. Multiple retained root segments 6. Rampant dental caries 7. Chronic Periodontitis with bone loss 8. Accretions 9. Generalized gingival recession 10. Generalized tooth mobility. 11. Malocclusion 12. Cardiac compromise with the risk for complications up to and including death due to the Cardiovascular compromise.  PLAN/RECOMMENDATIONS: 1. I discussed the risks, benefits, and complications of various treatment options with the patient in relationship to the medical and dental conditions, anticipated aortic valve replacement, and risk for endocarditis. We discussed various treatment options to include no treatment, multiple extractions with alveoloplasty, pre-prosthetic surgery as indicated, periodontal therapy, dental restorations, root canal therapy, crown and bridge therapy, implant therapy, and replacement of missing teeth as indicated. The patient currently wishes to proceed with extraction of remaining teeth with alveoloplasty and pre-prosthetic surgery as indicated in the operating room with general anesthesia on Wednesday, 11/25/2014 at 10:45 AM. Patient will then proceed with the aortic valve replacement with Dr. Dorris Fetch at the discretion  the following week. Patient will follow-up with a dentist of his choice for fabrication of upper lower complete dentures after adequate healing from the extractions and once medically stable from the anticipated aortic valve replacement.   2. Discussion of findings with medical team and coordination of future medical and dental care as needed.   Charlynne Pander, DDS

## 2014-11-23 NOTE — Progress Notes (Signed)
  Subjective: No chest pain or dizziness Has not seen Dentist yet  Objective: Vital signs in last 24 hours: Temp:  [97.6 F (36.4 C)-98.7 F (37.1 C)] 97.6 F (36.4 C) (06/27 0500) Pulse Rate:  [59-87] 59 (06/27 0500) Cardiac Rhythm:  [-] Normal sinus rhythm;Heart block (06/27 0500) Resp:  [18-19] 19 (06/27 0500) BP: (103-120)/(54-67) 103/54 mmHg (06/27 0500) SpO2:  [95 %-98 %] 96 % (06/27 0500) Weight:  [216 lb 9.6 oz (98.249 kg)] 216 lb 9.6 oz (98.249 kg) (06/27 0500)  Hemodynamic parameters for last 24 hours:    Intake/Output from previous day: 06/26 0701 - 06/27 0700 In: -  Out: 2050 [Urine:2050] Intake/Output this shift: Total I/O In: -  Out: 400 [Urine:400]  General appearance: alert and no distress Heart: irregularly irregular rhythm  Lab Results:  Recent Labs  11/21/14 1603 11/22/14 0245  WBC 14.3* 12.7*  HGB 13.7 13.3  HCT 40.4 40.0  PLT 157 153   BMET:  Recent Labs  11/20/14 2216 11/21/14 1603 11/22/14 0245  NA 140  --  139  K 3.7  --  3.8  CL 103  --  102  CO2 28  --  27  GLUCOSE 168*  --  100*  BUN 20  --  21*  CREATININE 1.06 0.99 0.96  CALCIUM 9.6  --  9.7    PT/INR: No results for input(s): LABPROT, INR in the last 72 hours. ABG    Component Value Date/Time   PHART 7.459* 10/04/2014 0812   HCO3 19.9* 10/04/2014 0812   TCO2 21 10/04/2014 0812   ACIDBASEDEF 3.0* 10/04/2014 0812   O2SAT 79.0 10/04/2014 0812   CBG (last 3)  No results for input(s): GLUCAP in the last 72 hours.  Assessment/Plan: S/P   -39 yo man with AS and a history of transient CHB  He will need an AVR, but needs a dental evaluation and extractions first. He also has >100K colonies of GNR in his urine, after having an e coli UTI in May. That will need to be treated before AVR as well.  I will be away the remainder of the week. Please call our office if there are any questions.  If he is ready for surgery by the end of the week, which is questionable, my  partners will do it.   LOS: 2 days    Loreli Slot 11/23/2014

## 2014-11-24 ENCOUNTER — Encounter (HOSPITAL_COMMUNITY): Payer: Self-pay

## 2014-11-24 ENCOUNTER — Inpatient Hospital Stay (HOSPITAL_COMMUNITY): Payer: Self-pay

## 2014-11-24 LAB — BASIC METABOLIC PANEL
Anion gap: 7 (ref 5–15)
BUN: 24 mg/dL — ABNORMAL HIGH (ref 6–20)
CO2: 26 mmol/L (ref 22–32)
Calcium: 9.2 mg/dL (ref 8.9–10.3)
Chloride: 102 mmol/L (ref 101–111)
Creatinine, Ser: 1.06 mg/dL (ref 0.61–1.24)
Glucose, Bld: 112 mg/dL — ABNORMAL HIGH (ref 65–99)
POTASSIUM: 4 mmol/L (ref 3.5–5.1)
SODIUM: 135 mmol/L (ref 135–145)

## 2014-11-24 LAB — CBC
HEMATOCRIT: 41.3 % (ref 39.0–52.0)
Hemoglobin: 14 g/dL (ref 13.0–17.0)
MCH: 31 pg (ref 26.0–34.0)
MCHC: 33.9 g/dL (ref 30.0–36.0)
MCV: 91.6 fL (ref 78.0–100.0)
PLATELETS: 154 10*3/uL (ref 150–400)
RBC: 4.51 MIL/uL (ref 4.22–5.81)
RDW: 13.1 % (ref 11.5–15.5)
WBC: 11.9 10*3/uL — AB (ref 4.0–10.5)

## 2014-11-24 MED ORDER — SODIUM CHLORIDE 0.9 % IV SOLN
INTRAVENOUS | Status: DC
  Administered 2014-11-25: 06:00:00 via INTRAVENOUS

## 2014-11-24 MED ORDER — CEFAZOLIN SODIUM-DEXTROSE 2-3 GM-% IV SOLR: 2.0000 g | INTRAVENOUS | Status: DC

## 2014-11-24 MED ORDER — CHLORHEXIDINE GLUCONATE 4 % EX LIQD
60.0000 mL | Freq: Once | CUTANEOUS | Status: AC
  Administered 2014-11-25: 4 via TOPICAL
  Filled 2014-11-24: qty 60

## 2014-11-24 MED ORDER — SODIUM CHLORIDE 0.9 % IR SOLN
80.0000 mg | Status: DC
  Filled 2014-11-24: qty 2

## 2014-11-24 MED ORDER — CEFAZOLIN SODIUM-DEXTROSE 2-3 GM-% IV SOLR
2.0000 g | INTRAVENOUS | Status: AC
Start: 2014-11-25 — End: 2014-11-25
  Administered 2014-11-25: 2 g via INTRAVENOUS
  Filled 2014-11-24: qty 50

## 2014-11-24 MED ORDER — CHLORHEXIDINE GLUCONATE 4 % EX LIQD
60.0000 mL | Freq: Once | CUTANEOUS | Status: AC
  Administered 2014-11-24: 4 via TOPICAL
  Filled 2014-11-24: qty 60

## 2014-11-24 NOTE — Anesthesia Preprocedure Evaluation (Addendum)
Anesthesia Evaluation  Patient identified by MRN, date of birth, ID band Patient awake    Reviewed: Allergy & Precautions, H&P , NPO status , Patient's Chart, lab work & pertinent test results  Airway Mallampati: II  TM Distance: >3 FB Neck ROM: Full    Dental no notable dental hx. (+) Poor Dentition, Dental Advisory Given   Pulmonary shortness of breath, former smoker,  breath sounds clear to auscultation  Pulmonary exam normal       Cardiovascular +CHF + dysrhythmias + Valvular Problems/Murmurs AS Rhythm:Regular Rate:Normal + Systolic murmurs    Neuro/Psych negative neurological ROS  negative psych ROS   GI/Hepatic negative GI ROS, Neg liver ROS,   Endo/Other  negative endocrine ROS  Renal/GU negative Renal ROS  negative genitourinary   Musculoskeletal   Abdominal   Peds  Hematology negative hematology ROS (+)   Anesthesia Other Findings   Reproductive/Obstetrics negative OB ROS                           Anesthesia Physical Anesthesia Plan  ASA: III  Anesthesia Plan: General   Post-op Pain Management:    Induction: Intravenous  Airway Management Planned: Nasal ETT and Video Laryngoscope Planned  Additional Equipment: Arterial line  Intra-op Plan:   Post-operative Plan: Extubation in OR  Informed Consent: I have reviewed the patients History and Physical, chart, labs and discussed the procedure including the risks, benefits and alternatives for the proposed anesthesia with the patient or authorized representative who has indicated his/her understanding and acceptance.   Dental advisory given  Plan Discussed with: CRNA  Anesthesia Plan Comments:        Anesthesia Quick Evaluation

## 2014-11-24 NOTE — Progress Notes (Signed)
Discussed patient with Corine Shelter, PA today.  He is scheduled for dental extraction tomorrow morning at 10:30AM.  We will plan temp-perm pacemaker implant with Dr Ladona Ridgel tomorrow first case.  Orders written.   Gypsy Balsam, NP 11/24/2014 10:17 AM    Co Sign: Hillis Range, MD 11/24/2014 11:41 AM

## 2014-11-24 NOTE — Progress Notes (Signed)
Pre-op Cardiac Surgery  Carotid Findings:  Bilateral:  1-39% ICA stenosis.  Vertebral artery flow is antegrade.       Upper Extremity Right Left  Brachial Pressures 123 104  Radial Waveforms Tri Tri  Ulnar Waveforms Tri Tri  Palmar Arch (Allen's Test) Obliterates with radial and ulnar compression Decreases >50% with radial compression, normal with ulnar compression   Farrel Demark, RDMS, RVT 11/24/2014

## 2014-11-24 NOTE — Care Management Note (Signed)
Case Management Note  Patient Details  Name: Nicholas Horton MRN: [id number] Date of Birth: 1975-11-18  Subjective/Objective:    Pt admitted with syncope, also has had CHB may need PPM during this admission, with need for AVR, plan for dental extractions on 11/25/14                Action/Plan: PTA pt lived at home- NCM to follow for d/c needs  Expected Discharge Date:   unknown           Expected Discharge Plan:  Home/Self Care  In-House Referral:  Clinical Social Work  Discharge planning Services  CM Consult  Post Acute Care Choice:    Choice offered to:     DME Arranged:    DME Agency:     HH Arranged:    HH Agency:     Status of Service:  In process, will continue to follow  Medicare Important Message Given:    Date Medicare IM Given:    Medicare IM give by:    Date Additional Medicare IM Given:    Additional Medicare Important Message give by:     If discussed at Long Length of Stay Meetings, dates discussed:    Additional Comments:  Darrold Span, RN 11/24/2014, 10:26 AM

## 2014-11-24 NOTE — Progress Notes (Signed)
Patient: Nicholas Horton / Admit Date: 11/21/2014 / Date of Encounter: 11/24/2014, 10:16 AM   Subjective: No complaints this morning. No CP, dizziness, or SOB.   Objective: Telemetry: Complete heart block with junctional escape in low 50s and intermittent wide complex ventricular escape with rates in upper 30s.   Physical Exam: Blood pressure 97/48, pulse 53, temperature 97.9 F (36.6 C), temperature source Oral, resp. rate 17, height  (1.88 m), weight 98.1 kg (216 lb 4.3 oz), SpO2 97 %. General: Well developed, well nourished WM, in no acute distress.  Head: Normocephalic, atraumatic, sclera non-icteric, no xanthomas, nares are without discharge. Poor dentition. Neck:  JVP not elevated. Lungs: Clear bilaterally to auscultation without wheezes, rales, or rhonchi. Breathing is unlabored. Heart: RRR. Loud systolic murmur over RUSB and entire precordium. No rubs or gallops.  Abdomen: Soft, non-tender, non-distended with normoactive bowel sounds. No rebound/guarding. Extremities: No clubbing or cyanosis. No edema. Distal pedal pulses are 2+ and equal bilaterally. Neuro: Alert and oriented X 3. Moves all extremities spontaneously. Psych:  Responds to questions appropriately with a normal affect.   Intake/Output Summary (Last 24 hours) at 11/24/14 1016 Last data filed at 11/24/14 0900  Gross per 24 hour  Intake    480 ml  Output   1025 ml  Net   -545 ml    Inpatient Medications:  . aspirin EC  81 mg Oral Daily  . atorvastatin  40 mg Oral Daily  . furosemide  40 mg Oral BID  . heparin  5,000 Units Subcutaneous 3 times per day  . lisinopril  5 mg Oral Daily  . nitrofurantoin (macrocrystal-monohydrate)  100 mg Oral Q12H  . sodium chloride  3 mL Intravenous Q12H  . spironolactone  12.5 mg Oral Daily   Infusions:    Labs:  Recent Labs  11/22/14 0245 11/24/14 0620  NA 139 135  K 3.8 4.0  CL 102 102  CO2 27 26  GLUCOSE 100* 112*  BUN 21* 24*  CREATININE 0.96 1.06    CALCIUM 9.7 9.2   No results for input(s): AST, ALT, ALKPHOS, BILITOT, PROT, ALBUMIN in the last 72 hours.  Recent Labs  11/22/14 0245 11/24/14 0620  WBC 12.7* 11.9*  HGB 13.3 14.0  HCT 40.0 41.3  MCV 91.3 91.6  PLT 153 154   No results for input(s): CKTOTAL, CKMB, TROPONINI in the last 72 hours. Invalid input(s): POCBNP No results for input(s): HGBA1C in the last 72 hours.   Radiology/Studies:  Dg Orthopantogram  11/21/2014   CLINICAL DATA:  Dental abscesses/ cavities.  EXAM: ORTHOPANTOGRAM/PANORAMIC  COMPARISON:  None.  FINDINGS: Blurring motion artifact obscures the teeth in the midline.  In the maxilla, the remaining left incisor and right canine do not have a cavity. In the mandible, the incisors and, right canine, and right medial premolar do not have cavities.  The rest of the remaining teeth have cavities ranging from mild to severe.  Along the residual segments of the right mandibular molar, there is periapical lucency with suspected reactive sclerosis in the adjacent mandible suggesting periapical abscess ; there is sclerosis of the upper portion of the adjacent mandible which may be reactive. There is also periapical lucency associated with the left maxillary molars and premolars.  IMPRESSION: 1. Markedly severe dental cavities. Periapical lucency with surrounding reactive sclerosis associated with the remaining right mandibular molar fragments. Periapical lucency also associated with the remaining left maxillary molar and premolars.   Electronically Signed   By: Gaylyn Rong  M.D.   On: 11/21/2014 18:29   Dg Chest Portable 1 View  11/20/2014   CLINICAL DATA:  Acute onset of left-sided chest pain. Initial encounter.  EXAM: PORTABLE CHEST - 1 VIEW  COMPARISON:  Chest radiograph performed 10/03/2014  FINDINGS: The lungs are well-aerated. Peribronchial thickening is noted. Mild vascular congestion is seen. Mild bibasilar atelectasis is noted. There is no evidence of pleural  effusion or pneumothorax.  The cardiomediastinal silhouette is within normal limits. No acute osseous abnormalities are seen.  IMPRESSION: Peribronchial thickening noted. Mild vascular congestion seen. Mild bibasilar atelectasis noted.   Electronically Signed   By: Roanna Raider M.D.   On: 11/20/2014 22:33     Assessment and Plan  78M with history of NICM, bicuspid AV with moderate AS, transient high grade AV block in 09/2014, chronic combined CHF admitted 11/21/14 with syncope and mildly elevated troponin.  1. Moderate to severe aortic stenosis with bicuspid aortic valve - Tx from [facility] following admission for syncope with mild trop elevation.Currently no complaints.Continue current meds. TCTS has seen and will plan AVR  next week.  2. Syncope - initially felt 2/2 #1 but now concerned for bradyarrhythmia due to evidence of CHB on telemetry this morning with ventricular escape of 38 bpm. Dr. Graciela Husbands has seen and is considering a temp/perm pacemaker for the short term and his notes indicate he will co-ordinate this with anesthesia.   3. Recent CHB 09/2014 with recurrence this admission - not on any AVN blocking agents. TSH normal. Currently asymptomatic. Per EP recommendations.  4. NICM: Cath 10/04/14 w/ minimal CAD, EF 20-25% by V gram @ that time, 43% by MRI and 50-55% by echo this admission. Cont PO lasix.Cont ACEI/spiro.No BB 2/2 h/o conduction abnormality.  5. Nonobstructive ASCAD by cath 09/2014 - on ASA and statin. Troponin felt likely demand ischemia in the process that lead to syncope.  6. Dental Caries & Peridontitis:Orthopantogram done with markedly severe dental caries.  Dr. Luretha Murphy plans extraction of remaining teeth with alveoloplasty tomorrow.   7. Persistent leukocytosis - urine culture in 6/25 showed E. Coli. Sensitive to nitrofurantoin, cefoxitin, gentamicin, imipenem, pip/tazo.  Now on  nitrofurantoin 100mg  BID x 10 days.  Signed,   Peter [place], MDFACC 11/24/2014 10:16  AM

## 2014-11-24 NOTE — Progress Notes (Signed)
11/24/2014 2:50 AM The patient's heart rate has gone down to the mid 40s and sometimes goes as low as 39.  I paged Dr. Tresa Endo to see if he wanted anything to be done about it.  I informed him the patient was sleeping and asymptomatic.  Dr. Tresa Endo said as long as the patient was asymptomatic he wasn't concerned.  Will continue to monitor the patient. Harriet Masson, RN

## 2014-11-25 ENCOUNTER — Encounter (HOSPITAL_COMMUNITY): Payer: Self-pay | Admitting: Internal Medicine

## 2014-11-25 ENCOUNTER — Inpatient Hospital Stay (HOSPITAL_COMMUNITY): Payer: Self-pay | Admitting: Certified Registered Nurse Anesthetist

## 2014-11-25 ENCOUNTER — Encounter (HOSPITAL_COMMUNITY)
Admission: AD | Disposition: A | Discharge status: Home / Self Care | Payer: Self-pay | Source: Other Acute Inpatient Hospital | Attending: Cardiology

## 2014-11-25 DIAGNOSIS — K0530 Chronic periodontitis, unspecified: Secondary | ICD-10-CM | POA: Diagnosis present

## 2014-11-25 DIAGNOSIS — K029 Dental caries, unspecified: Secondary | ICD-10-CM

## 2014-11-25 DIAGNOSIS — K045 Chronic apical periodontitis: Secondary | ICD-10-CM | POA: Diagnosis present

## 2014-11-25 DIAGNOSIS — K083 Retained dental root: Secondary | ICD-10-CM

## 2014-11-25 HISTORY — PX: CARDIAC CATHETERIZATION: SHX172

## 2014-11-25 HISTORY — PX: MULTIPLE EXTRACTIONS WITH ALVEOLOPLASTY: SHX5342

## 2014-11-25 SURGERY — MULTIPLE EXTRACTION WITH ALVEOLOPLASTY
Anesthesia: General | Site: Mouth

## 2014-11-25 SURGERY — TEMPORARY PACEMAKER
Anesthesia: LOCAL

## 2014-11-25 MED ORDER — LIDOCAINE HCL (CARDIAC) 20 MG/ML IV SOLN
INTRAVENOUS | Status: DC | PRN
  Administered 2014-11-25: 50 mg via INTRAVENOUS

## 2014-11-25 MED ORDER — GELATIN ABSORBABLE 12-7 MM EX MISC
CUTANEOUS | Status: DC | PRN
  Administered 2014-11-25: 1

## 2014-11-25 MED ORDER — MIDAZOLAM HCL 5 MG/5ML IJ SOLN
INTRAMUSCULAR | Status: AC
  Filled 2014-11-25: qty 5

## 2014-11-25 MED ORDER — HYDROMORPHONE HCL 1 MG/ML IJ SOLN
0.2500 mg | INTRAMUSCULAR | Status: DC | PRN
  Administered 2014-11-25 (×2): 0.5 mg via INTRAVENOUS

## 2014-11-25 MED ORDER — CEFAZOLIN SODIUM-DEXTROSE 2-3 GM-% IV SOLR
INTRAVENOUS | Status: DC | PRN
  Administered 2014-11-25: 2 g via INTRAVENOUS

## 2014-11-25 MED ORDER — CEFAZOLIN SODIUM-DEXTROSE 2-3 GM-% IV SOLR
INTRAVENOUS | Status: AC
  Filled 2014-11-25: qty 50

## 2014-11-25 MED ORDER — OXYMETAZOLINE HCL 0.05 % NA SOLN
NASAL | Status: AC
  Filled 2014-11-25: qty 15

## 2014-11-25 MED ORDER — PHENYLEPHRINE HCL 10 MG/ML IJ SOLN
10.0000 mg | INTRAVENOUS | Status: DC | PRN
  Administered 2014-11-25: 40 ug/min via INTRAVENOUS

## 2014-11-25 MED ORDER — HEPARIN (PORCINE) IN NACL 2-0.9 UNIT/ML-% IJ SOLN
INTRAMUSCULAR | Status: DC | PRN
  Administered 2014-11-25: 500 mL

## 2014-11-25 MED ORDER — SODIUM CHLORIDE 0.9 % IR SOLN
Status: AC
  Filled 2014-11-25: qty 2

## 2014-11-25 MED ORDER — OXYMETAZOLINE HCL 0.05 % NA SOLN
NASAL | Status: DC | PRN
  Administered 2014-11-25: 2 via NASAL

## 2014-11-25 MED ORDER — BUPIVACAINE-EPINEPHRINE (PF) 0.5% -1:200000 IJ SOLN
INTRAMUSCULAR | Status: AC
  Filled 2014-11-25: qty 3.6

## 2014-11-25 MED ORDER — FENTANYL CITRATE (PF) 100 MCG/2ML IJ SOLN
INTRAMUSCULAR | Status: DC | PRN
  Administered 2014-11-25: 12.5 ug via INTRAVENOUS
  Administered 2014-11-25: 25 ug via INTRAVENOUS

## 2014-11-25 MED ORDER — ROCURONIUM BROMIDE 100 MG/10ML IV SOLN
INTRAVENOUS | Status: DC | PRN
  Administered 2014-11-25: 40 mg via INTRAVENOUS

## 2014-11-25 MED ORDER — LIDOCAINE-EPINEPHRINE 2 %-1:100000 IJ SOLN
INTRAMUSCULAR | Status: DC | PRN
  Administered 2014-11-25: 10.2 mL

## 2014-11-25 MED ORDER — FENTANYL CITRATE (PF) 100 MCG/2ML IJ SOLN
INTRAMUSCULAR | Status: DC | PRN
  Administered 2014-11-25 (×2): 50 ug via INTRAVENOUS
  Administered 2014-11-25: 100 ug via INTRAVENOUS
  Administered 2014-11-25: 50 ug via INTRAVENOUS

## 2014-11-25 MED ORDER — OXYCODONE-ACETAMINOPHEN 5-325 MG PO TABS
ORAL_TABLET | ORAL | Status: AC
  Administered 2014-11-25: 2 via ORAL
  Filled 2014-11-25: qty 2

## 2014-11-25 MED ORDER — LACTATED RINGERS IV SOLN
INTRAVENOUS | Status: DC
  Administered 2014-11-25: 11:00:00 via INTRAVENOUS

## 2014-11-25 MED ORDER — 0.9 % SODIUM CHLORIDE (POUR BTL) OPTIME
TOPICAL | Status: DC | PRN
  Administered 2014-11-25: 1000 mL

## 2014-11-25 MED ORDER — LIDOCAINE HCL (PF) 1 % IJ SOLN
INTRAMUSCULAR | Status: DC | PRN
  Administered 2014-11-25: 20 mL

## 2014-11-25 MED ORDER — FENTANYL CITRATE (PF) 100 MCG/2ML IJ SOLN
INTRAMUSCULAR | Status: AC
  Filled 2014-11-25: qty 2

## 2014-11-25 MED ORDER — OXYCODONE-ACETAMINOPHEN 5-325 MG PO TABS
1.0000 | ORAL_TABLET | ORAL | Status: DC | PRN
  Administered 2014-11-25 – 2014-12-01 (×5): 2 via ORAL
  Filled 2014-11-25 (×4): qty 2

## 2014-11-25 MED ORDER — MIDAZOLAM HCL 2 MG/2ML IJ SOLN
INTRAMUSCULAR | Status: AC
  Filled 2014-11-25: qty 2

## 2014-11-25 MED ORDER — PROPOFOL 10 MG/ML IV BOLUS
INTRAVENOUS | Status: DC | PRN
  Administered 2014-11-25: 150 mg via INTRAVENOUS

## 2014-11-25 MED ORDER — PROPOFOL 10 MG/ML IV BOLUS
INTRAVENOUS | Status: AC
  Filled 2014-11-25: qty 20

## 2014-11-25 MED ORDER — ONDANSETRON HCL 4 MG/2ML IJ SOLN
INTRAMUSCULAR | Status: DC | PRN
  Administered 2014-11-25: 4 mg via INTRAVENOUS

## 2014-11-25 MED ORDER — HEPARIN (PORCINE) IN NACL 2-0.9 UNIT/ML-% IJ SOLN
INTRAMUSCULAR | Status: AC
  Filled 2014-11-25: qty 500

## 2014-11-25 MED ORDER — MIDAZOLAM HCL 2 MG/2ML IJ SOLN
INTRAMUSCULAR | Status: DC | PRN
  Administered 2014-11-25 (×3): 1 mg via INTRAVENOUS

## 2014-11-25 MED ORDER — GLYCOPYRROLATE 0.2 MG/ML IJ SOLN
INTRAMUSCULAR | Status: DC | PRN
  Administered 2014-11-25: 0.4 mg via INTRAVENOUS

## 2014-11-25 MED ORDER — LIDOCAINE-EPINEPHRINE 2 %-1:100000 IJ SOLN
INTRAMUSCULAR | Status: AC
  Filled 2014-11-25: qty 1.7

## 2014-11-25 MED ORDER — ONDANSETRON HCL 4 MG/2ML IJ SOLN: 4.0000 mg | Freq: Four times a day (QID) | INTRAMUSCULAR | Status: DC | PRN

## 2014-11-25 MED ORDER — MIDAZOLAM HCL 5 MG/5ML IJ SOLN
INTRAMUSCULAR | Status: DC | PRN
  Administered 2014-11-25: 2 mg via INTRAVENOUS

## 2014-11-25 MED ORDER — HYDROMORPHONE HCL 1 MG/ML IJ SOLN
INTRAMUSCULAR | Status: AC
  Administered 2014-11-25: 0.5 mg via INTRAVENOUS
  Filled 2014-11-25: qty 1

## 2014-11-25 MED ORDER — NEOSTIGMINE METHYLSULFATE 10 MG/10ML IV SOLN
INTRAVENOUS | Status: DC | PRN
  Administered 2014-11-25: 3 mg via INTRAVENOUS

## 2014-11-25 MED ORDER — LACTATED RINGERS IV SOLN
INTRAVENOUS | Status: DC
  Administered 2014-11-25: 10 mL/h via INTRAVENOUS
  Administered 2014-11-25 – 2014-12-01 (×3): via INTRAVENOUS

## 2014-11-25 MED ORDER — LIDOCAINE HCL (PF) 1 % IJ SOLN
INTRAMUSCULAR | Status: AC
  Filled 2014-11-25: qty 60

## 2014-11-25 MED ORDER — FENTANYL CITRATE (PF) 250 MCG/5ML IJ SOLN
INTRAMUSCULAR | Status: AC
  Filled 2014-11-25: qty 5

## 2014-11-25 MED ORDER — SUCCINYLCHOLINE CHLORIDE 20 MG/ML IJ SOLN
INTRAMUSCULAR | Status: DC | PRN
  Administered 2014-11-25: 100 mg via INTRAVENOUS

## 2014-11-25 MED ORDER — EPHEDRINE SULFATE 50 MG/ML IJ SOLN
INTRAMUSCULAR | Status: DC | PRN
  Administered 2014-11-25 (×2): 10 mg via INTRAVENOUS

## 2014-11-25 MED ORDER — LIDOCAINE-EPINEPHRINE 2 %-1:100000 IJ SOLN
INTRAMUSCULAR | Status: AC
Start: 2014-11-25 — End: 2014-11-25
  Filled 2014-11-25: qty 10.2

## 2014-11-25 MED ORDER — MORPHINE SULFATE 2 MG/ML IJ SOLN
2.0000 mg | INTRAMUSCULAR | Status: DC | PRN
  Filled 2014-11-25: qty 1

## 2014-11-25 MED ORDER — BUPIVACAINE-EPINEPHRINE 0.5% -1:200000 IJ SOLN
INTRAMUSCULAR | Status: DC | PRN
  Administered 2014-11-25: 3.6 mL

## 2014-11-25 MED ORDER — ACETAMINOPHEN 325 MG PO TABS: 325.0000 mg | ORAL_TABLET | ORAL | Status: DC | PRN

## 2014-11-25 SURGICAL SUPPLY — 6 items
CABLE SURGICAL S-101-97-12 (CABLE) ×2 IMPLANT
LEAD TENDRIL SDX 1688TC-58CM (Lead) ×2 IMPLANT
PAD DEFIB LIFELINK (PAD) ×2 IMPLANT
PPM ZEPHYR SR 5620 (Pacemaker) ×2 IMPLANT
SHEATH CLASSIC 7F (SHEATH) ×2 IMPLANT
TRAY PACEMAKER INSERTION (CUSTOM PROCEDURE TRAY) ×2 IMPLANT

## 2014-11-25 SURGICAL SUPPLY — 32 items
ALCOHOL 70% 16 OZ (MISCELLANEOUS) ×2 IMPLANT
ATTRACTOMAT 16X20 MAGNETIC DRP (DRAPES) ×2 IMPLANT
BLADE SURG 15 STRL LF DISP TIS (BLADE) ×2 IMPLANT
BLADE SURG 15 STRL SS (BLADE) ×2
COVER SURGICAL LIGHT HANDLE (MISCELLANEOUS) ×2 IMPLANT
GAUZE PACKING FOLDED 2  STR (GAUZE/BANDAGES/DRESSINGS) ×1
GAUZE PACKING FOLDED 2 STR (GAUZE/BANDAGES/DRESSINGS) ×1 IMPLANT
GAUZE SPONGE 4X4 16PLY XRAY LF (GAUZE/BANDAGES/DRESSINGS) ×2 IMPLANT
GLOVE BIOGEL PI IND STRL 6 (GLOVE) ×1 IMPLANT
GLOVE BIOGEL PI INDICATOR 6 (GLOVE) ×1
GLOVE SURG ORTHO 8.0 STRL STRW (GLOVE) ×2 IMPLANT
GLOVE SURG SS PI 6.0 STRL IVOR (GLOVE) ×2 IMPLANT
GOWN STRL REUS W/ TWL LRG LVL3 (GOWN DISPOSABLE) ×1 IMPLANT
GOWN STRL REUS W/TWL 2XL LVL3 (GOWN DISPOSABLE) ×2 IMPLANT
GOWN STRL REUS W/TWL LRG LVL3 (GOWN DISPOSABLE) ×1
HEMOSTAT SURGICEL 2X14 (HEMOSTASIS) ×2 IMPLANT
KIT BASIN OR (CUSTOM PROCEDURE TRAY) ×2 IMPLANT
KIT ROOM TURNOVER OR (KITS) ×2 IMPLANT
MANIFOLD NEPTUNE WASTE (CANNULA) ×2 IMPLANT
NEEDLE BLUNT 16X1.5 OR ONLY (NEEDLE) ×2 IMPLANT
NS IRRIG 1000ML POUR BTL (IV SOLUTION) ×2 IMPLANT
PACK EENT II TURBAN DRAPE (CUSTOM PROCEDURE TRAY) ×2 IMPLANT
PAD ARMBOARD 7.5X6 YLW CONV (MISCELLANEOUS) ×2 IMPLANT
SPONGE SURGIFOAM ABS GEL 100 (HEMOSTASIS) ×2 IMPLANT
SPONGE SURGIFOAM ABS GEL 12-7 (HEMOSTASIS) IMPLANT
SPONGE SURGIFOAM ABS GEL SZ50 (HEMOSTASIS) IMPLANT
SUCTION FRAZIER TIP 10 FR DISP (SUCTIONS) ×2 IMPLANT
SUT CHROMIC 3 0 PS 2 (SUTURE) ×8 IMPLANT
SYR 50ML SLIP (SYRINGE) ×2 IMPLANT
TOWEL OR 17X26 10 PK STRL BLUE (TOWEL DISPOSABLE) ×2 IMPLANT
TUBE CONNECTING 12X1/4 (SUCTIONS) ×2 IMPLANT
YANKAUER SUCT BULB TIP NO VENT (SUCTIONS) ×2 IMPLANT

## 2014-11-25 NOTE — Progress Notes (Signed)
Patient returned from oral surgery.  Pacer placed before oral surgery.  Pacer is not capturing and heart rate is between 35 and 45.  Pads placed on patient.  Patient family updated on patient plan of care.  Huey Bienenstock, PA notified. No orders placed at this time.

## 2014-11-25 NOTE — H&P (Signed)
Patient Name: Nicholas Horton Date of Encounter: 11/25/2014     Principal Problem:   Syncope Active Problems:   Moderate to severe AS with bicuspid valve. Mean 33, peak 61 mmHg.   Bicuspid aortic valve   Tobacco use disorder   Poor dentition   Nonischemic cardiomyopathy-EF 50-55% echo 11/20/14   Elevated troponin   Paradentosis   Acute on chronic combined systolic and diastolic CHF    Complete heart block   E. Coli UTI    SUBJECTIVE  Denies chest pain or sob  CURRENT MEDS . aspirin EC  81 mg Oral Daily  . atorvastatin  40 mg Oral Daily  .  ceFAZolin (ANCEF) IV  2 g Intravenous To Cath  . furosemide  40 mg Oral BID  . gentamicin irrigation  80 mg Irrigation On Call  . lisinopril  5 mg Oral Daily  . nitrofurantoin (macrocrystal-monohydrate)  100 mg Oral Q12H  . sodium chloride  3 mL Intravenous Q12H  . spironolactone  12.5 mg Oral Daily    OBJECTIVE  Filed Vitals:   11/24/14 1444 11/24/14 2058 11/25/14 0543 11/25/14 0545  BP: 108/58 107/75 96/57   Pulse: 46 94 76   Temp: 97.6 F (36.4 C) 98.3 F (36.8 C) 98.3 F (36.8 C)   TempSrc: Oral Oral Oral   Resp: 18 18 18    Height:      Weight:    219 lb 2.2 oz (99.4 kg)  SpO2: 96% 99% 96%     Intake/Output Summary (Last 24 hours) at 11/25/14 0813 Last data filed at 11/25/14 0155  Gross per 24 hour  Intake    840 ml  Output   2550 ml  Net  -1710 ml   Filed Weights   11/23/14 0500 11/24/14 0524 11/25/14 0545  Weight: 216 lb 9.6 oz (98.249 kg) 216 lb 4.3 oz (98.1 kg) 219 lb 2.2 oz (99.4 kg)    PHYSICAL EXAM  General: Pleasant, NAD. Neuro: Alert and oriented X 3. Moves all extremities spontaneously. Psych: Normal affect. HEENT:  Normal  Neck: Supple without bruits or JVD. Lungs:  Resp regular and unlabored, CTA. Heart: RRR no s3, s4, or murmurs. Abdomen: Soft, non-tender, non-distended, BS + x 4.  Extremities: No clubbing, cyanosis or edema. DP/PT/Radials 2+ and equal bilaterally.  Accessory  Clinical Findings  CBC  Recent Labs  11/24/14 0620  WBC 11.9*  HGB 14.0  HCT 41.3  MCV 91.6  PLT 154   Basic Metabolic Panel  Recent Labs  11/24/14 0620  NA 135  K 4.0  CL 102  CO2 26  GLUCOSE 112*  BUN 24*  CREATININE 1.06  CALCIUM 9.2   Liver Function Tests No results for input(s): AST, ALT, ALKPHOS, BILITOT, PROT, ALBUMIN in the last 72 hours. No results for input(s): LIPASE, AMYLASE in the last 72 hours. Cardiac Enzymes No results for input(s): CKTOTAL, CKMB, CKMBINDEX, TROPONINI in the last 72 hours. BNP Invalid input(s): POCBNP D-Dimer No results for input(s): DDIMER in the last 72 hours. Hemoglobin A1C No results for input(s): HGBA1C in the last 72 hours. Fasting Lipid Panel No results for input(s): CHOL, HDL, LDLCALC, TRIG, CHOLHDL, LDLDIRECT in the last 72 hours. Thyroid Function Tests No results for input(s): TSH, T4TOTAL, T3FREE, THYROIDAB in the last 72 hours.  Invalid input(s): FREET3  TELE  nsr with chb  Radiology/Studies  Dg Orthopantogram  11/21/2014   CLINICAL DATA:  Dental abscesses/ cavities.  EXAM: ORTHOPANTOGRAM/PANORAMIC  COMPARISON:  None.  FINDINGS: Blurring  motion artifact obscures the teeth in the midline.  In the maxilla, the remaining left incisor and right canine do not have a cavity. In the mandible, the incisors and, right canine, and right medial premolar do not have cavities.  The rest of the remaining teeth have cavities ranging from mild to severe.  Along the residual segments of the right mandibular molar, there is periapical lucency with suspected reactive sclerosis in the adjacent mandible suggesting periapical abscess ; there is sclerosis of the upper portion of the adjacent mandible which may be reactive. There is also periapical lucency associated with the left maxillary molars and premolars.  IMPRESSION: 1. Markedly severe dental cavities. Periapical lucency with surrounding reactive sclerosis associated with the remaining  right mandibular molar fragments. Periapical lucency also associated with the remaining left maxillary molar and premolars.   Electronically Signed   By: Gaylyn Rong M.D.   On: 11/21/2014 18:29   Dg Chest Portable 1 View  11/20/2014   CLINICAL DATA:  Acute onset of left-sided chest pain. Initial encounter.  EXAM: PORTABLE CHEST - 1 VIEW  COMPARISON:  Chest radiograph performed 10/03/2014  FINDINGS: The lungs are well-aerated. Peribronchial thickening is noted. Mild vascular congestion is seen. Mild bibasilar atelectasis is noted. There is no evidence of pleural effusion or pneumothorax.  The cardiomediastinal silhouette is within normal limits. No acute osseous abnormalities are seen.  IMPRESSION: Peribronchial thickening noted. Mild vascular congestion seen. Mild bibasilar atelectasis noted.   Electronically Signed   By: Roanna Raider M.D.   On: 11/20/2014 22:33    ASSESSMENT AND PLAN  1. Complete heart block 2. Poor dentition 3. Surgical AS Plan - Will plan to proceed with temp perm PM insertion prior to dental extraction  Kimaya Whitlatch,M.D.  11/25/2014 8:13 AM

## 2014-11-25 NOTE — Anesthesia Procedure Notes (Signed)
Procedure Name: Intubation Date/Time: 11/25/2014 11:26 AM Performed by: Adonis Housekeeper Pre-anesthesia Checklist: Patient identified, Emergency Drugs available, Suction available and Patient being monitored Patient Re-evaluated:Patient Re-evaluated prior to inductionOxygen Delivery Method: Circle system utilized Preoxygenation: Pre-oxygenation with 100% oxygen Intubation Type: IV induction Ventilation: Mask ventilation without difficulty Laryngoscope Size: Glidescope Nasal Tubes: Nasal Rae Tube size: 7.5 mm Number of attempts: 1 Placement Confirmation: ETT inserted through vocal cords under direct vision,  positive ETCO2 and breath sounds checked- equal and bilateral Secured at: 22 cm Tube secured with: Tape Dental Injury: Teeth and Oropharynx as per pre-operative assessment

## 2014-11-25 NOTE — Progress Notes (Signed)
PRE-OPERATIVE NOTE:  11/25/2014   Nicholas Horton [id number]  VITALS: BP 96/57 mmHg  Pulse 76  Temp(Src) 98.3 F (36.8 C) (Oral)  Resp 18  Ht 6\' 2"  (1.88 m)  Wt 219 lb 2.2 oz (99.4 kg)  BMI 28.12 kg/m2  SpO2 98%  Lab Results  Component Value Date   WBC 11.9* 11/24/2014   HGB 14.0 11/24/2014   HCT 41.3 11/24/2014   MCV 91.6 11/24/2014   PLT 154 11/24/2014   BMET    Component Value Date/Time   NA 135 11/24/2014 0620   K 4.0 11/24/2014 0620   CL 102 11/24/2014 0620   CO2 26 11/24/2014 0620   GLUCOSE 112* 11/24/2014 0620   BUN 24* 11/24/2014 0620   CREATININE 1.06 11/24/2014 0620   CALCIUM 9.2 11/24/2014 0620   GFRNONAA >60 11/24/2014 0620   GFRAA >60 11/24/2014 0620    No results found for: INR, PROTIME No results found for: PTT   Nicholas Horton presents for extraction of remaining teeth with alveoloplasty and pre-prosthetic surgery as needed in the operative room with general anesthesia. This is been performed as part of a medically necessary pre-heart valve surgery dental protocol.  Patient had temporary pacemaker placement by Dr. Ladona Ridgel this morning prior to the anticipated dental procedures due to recent development of complete heart block.   SUBJECTIVE: The patient denies any acute dental changes and agrees to proceed with treatment as planned.   EXAM: No sign of acute dental changes.  ASSESSMENT: Patient is affected by chronic apical periodontitis, rampant dental caries, multiple retained root segments, and chronic periodontitis.  PLAN: Patient agrees to proceed with treatment as planned in the operating room as previously discussed and accepts the risks, benefits, and complications of the proposed treatment. Patient is aware of the risk for bleeding, bruising, swelling, infection, pain, nerve damage, soft tissue damage, sinus involvement, root tip fracture, mandible fracture, and the risks of complications associated with the anesthesia. Patient is aware  of the potential consultations up to and including death to do his overall cardiovascular compromise. Patient also is aware of the potential for other complications not mentioned above.   Charlynne Pander, DDS

## 2014-11-25 NOTE — Op Note (Signed)
OPERATIVE REPORT  Patient:            Nicholas Horton Date of Birth:  [birth year] MRN:                [id number]   DATE OF PROCEDURE:  11/25/2014  PREOPERATIVE DIAGNOSES: 1. Aortic stenosis 2. Preaortic valve replacement dental protocol 3. Chronic apical periodontitis 4. Chronic periodontitis 5. Multiple retained root segments 6. Rampant dental caries  POSTOPERATIVE DIAGNOSES: 1. Aortic stenosis 2. Preaortic valve replacement dental protocol 3. Chronic apical periodontitis 4. Chronic periodontitis 5. Multiple retained root segments 6. Rampant dental caries  OPERATIONS: 1. Multiple extraction of tooth numbers 1, 2, 3, 4, 5, 6, 9, 10, 11, 12, 13, 14, 16, 19, 20, 21, 22, 23, 24, 25, 26, 27, 28, 29, 30, and 31. 2. 4 Quadrants of alveoloplasty 3. Gross debridement of remaining dentition   SURGEON: Charlynne Pander, DDS  ASSISTANT: Rory Percy, (dental assistant)  ANESTHESIA: General anesthesia via nasoendotracheal tube.  MEDICATIONS: 1. Ancef 2 g IV prior to invasive dental procedures. 2. Local anesthesia with a total utilization of 6 carpules each containing 34 mg of lidocaine with 0.017 mg of epinephrine as well as 2 carpules each containing 9 mg of bupivacaine with 0.009 mg of epinephrine.  SPECIMENS: There were 26 teeth that were discarded.  DRAINS: None  CULTURES: None  COMPLICATIONS: None   ESTIMATED BLOOD LOSS: 100 mLs.  INTRAVENOUS FLUIDS: 700 mLs of Lactated ringers solution.  INDICATIONS: The patient was recently diagnosed with severe aortic stenosis.  A dental consultation was then requested to evaluate poor dentition as part of a medically necessary preaortic valve replacement dental protocol.  The patient was examined and treatment planned for extraction of remaining teeth with alveoloplasty and pre-prosthetic surgery as indicated in the operating room with general anesthesia.  This treatment plan was formulated to decrease the risks and complications  associated with dental infection from affecting the patient's systemic health and the anticipated aortic valve replacement.  OPERATIVE FINDINGS: Patient was examined operating room number 10.  The teeth were identified for extraction. The patient was noted be affected by chronic periodontitis, chronic apical periodontitis, rampant dental caries, multiple retained root segments.   DESCRIPTION OF PROCEDURE: Patient was brought to the main operating room number 10. Patient was then placed in the supine position on the operating table. General anesthesia was then induced per the anesthesia team. The patient was then prepped and draped in the usual manner for dental medicine procedure. A timeout was performed. The patient was identified and procedures were verified. A throat pack was placed at this time. The oral cavity was then thoroughly examined with the findings noted above. The patient was then ready for dental medicine procedure as follows:  Local anesthesia was then administered sequentially with a total utilization of 6 carpules each containing 34 mg of lidocaine with 0.017 mg of epinephrine as well as 2 carpules  each containing 9 mg bupivacaine with 0.009 mg of epinephrine.  The Maxillary left and right quadrants first approached. Anesthesia was then delivered utilizing infiltration with lidocaine with epinephrine. A #15 blade incision was then made from the maxillary right tuberosity and extended to the maxillary left tuberosity.  A  surgical flap was then carefully reflected. The teeth were then subluxated with a series of straight elevators. Tooth numbers 1, 2, 3, 4, 5, 6, 9, 10, 11, 12, 13, 14, 16 were then removed with a 150 forceps and/or rongeur without complications. Alveoloplasty was then performed utilizing  a ronguers and bone file. The surgical site was then irrigated with copious amounts of sterile saline. The tissues were approximated and trimmed appropriately. A piece of Surgifoam was  placed in the extraction site area numbers 11 through 12. The maxillary right surgical site was then closed from the maxillary right tuberosity and extended the mesial #8 utilizing 3-0 chromic gut suture in a continuous interrupted suture technique 1. The maxillary left surgical site was then closed from the maxillary left tuberosity and extended to the mesial #9 utilizing 3-0 chromic gut suture in a continuous interrupted suture technique 1.  At this point time, the mandibular quadrants were approached. The patient was given bilateral inferior alveolar nerve blocks and long buccal nerve blocks utilizing the bupivacaine with epinephrine. Further infiltration was then achieved utilizing the lidocaine with epinephrine. A 15 blade incision was then made from the distal of number 18 and extended to the distal of #32 .  A surgical flap was then carefully reflected.  The lower teeth were then subluxated with a series straight elevators. Tooth numbers  22, 23, 24, 25, 26, 27, 28, 29 were then removed with a 151 forceps without complications. At this point time retained roots in the area of tooth numbers 30 and 31 were approached. A surgical handpiece and bur and copious amounts sterile water were utilized to remove bone around the retained roots areas #30 and 31. These roots were then elevated out with a series of cryers elevators without further combination. Tooth numbers 19, 20, 21 were then approached. A surgical handpiece and bur and copious amounts sterile water were utilized to remove bone around the teeth.  A 151 forceps was then utilized to remove tooth numbers 19, 20, 21 without complication. Alveoloplasty was then performed utilizing a rongeurs and bone file. The tissues were approximated and trimmed appropriately. The surgical sites were then irrigated with copious amounts of sterile saline 4. The mandibular left surgical site was then closed from the distal of  18 and extended the mesial #24 utilizing 3-0  chromic gut suture in a continuous interrupted suture technique 1. The mandibualr right surgical site was then closed from the distal of #32 and extended the mesial #25 utilizing 3-0 chromic gut suture in a continuous interrupted suture technique 1. One individual interrupted sutures then placed at further closed surgical site as needed.  At this point time, the entire mouth was irrigated with copious amounts of sterile saline. The patient was examined for complications, seeing none, the dental medicine procedure was deemed to be complete. The throat pack was removed at this time. An oral airway was then placed at the request of the anesthesia team. A series of 4 x 4 gauze were placed in the mouth to aid hemostasis. The patient was then handed over to the anesthesia team for final disposition. After an appropriate amount of time, the patient was extubated and taken to the postanesthsia care unit in good condition.  All counts were correct for the dental medicine procedure.  The patient will proceed with aortic valve replacement at the discretion of the heart surgeon.   Charlynne Pander, DDS.

## 2014-11-25 NOTE — Progress Notes (Signed)
Spoke with Geraldine Contras, RN on [address] who was caring for Pt .  She states that patient did have his CHG bath.  When asked if she could document it she stated it was reported to her from night RN and she will have them document it when they come in tonight.

## 2014-11-25 NOTE — Anesthesia Postprocedure Evaluation (Signed)
  Anesthesia Post-op Note  Patient: Nicholas Horton  Procedure(s) Performed: Procedure(s): Extraction of tooth #'s 1,2,3,4,5,6,7,9, 10, 11, 12,13,14,16, 19, 20, 21, 22, 23, 24, 25, 26, 27, 28, 29, 30, 31 with alveoloplasty (N/A)  Patient Location: PACU  Anesthesia Type:General  Level of Consciousness: awake, alert  and oriented  Airway and Oxygen Therapy: Patient Spontanous Breathing and Patient connected to nasal cannula oxygen  Post-op Pain: mild  Post-op Assessment: Post-op Vital signs reviewed, Patient's Cardiovascular Status Stable and Respiratory Function Stable              Post-op Vital Signs: Reviewed and stable  Last Vitals:  Filed Vitals:   11/25/14 1333  BP:   Pulse: 36  Temp: 36.7 C  Resp: 21    Complications: No apparent anesthesia complications

## 2014-11-25 NOTE — Transfer of Care (Signed)
Immediate Anesthesia Transfer of Care Note  Patient: Nicholas Horton  Procedure(s) Performed: Procedure(s): Extraction of tooth #'s 1,2,3,4,5,6,7,9, 10, 11, 12,13,14,16, 19, 20, 21, 22, 23, 24, 25, 26, 27, 28, 29, 30, 31 with alveoloplasty (N/A)  Patient Location: PACU  Anesthesia Type:General  Level of Consciousness: awake, alert , oriented and patient cooperative  Airway & Oxygen Therapy: Patient Spontanous Breathing and Patient connected to face mask oxygen  Post-op Assessment: Report given to RN, Post -op Vital signs reviewed and stable, Patient moving all extremities and Patient moving all extremities X 4  Post vital signs: Reviewed and stable  Last Vitals:  Filed Vitals:   11/25/14 1011  BP:   Pulse: 0  Temp:   Resp: 0    Complications: No apparent anesthesia complications

## 2014-11-25 NOTE — CV Procedure (Signed)
Electrophysiology procedure note  Procedure performed: Insertion of a temporary permanent transvenous pacemaker  Preprocedure diagnosis: Complete heart block, critical aortic stenosis, severe dental caries, pending dental extraction  Postprocedure diagnosis: Same as preprocedure diagnosis   Description of the procedure: After informed consent was obtained, the patient was taken to the diagnostic EP lab in the fasting state. After the usual preparation draping, 20 cc of lidocaine was infiltrated into the right neck. The right jugular vein was punctured. A 7 French peel-away sheath was placed into the vein. The St. Jude model 1688, 58 cm, active fixation pacing lead, serial number [id number] was advanced under fluoroscopic guidance into the right ventricle. Near the right ventricular apical septum, the lead was actively fixed. The pacing impedance was 800 ohms. R waves measured 9 mV. The pacing threshold was 0.6 V at 0.5 ms. 10 V pacing did not estimate the diaphragm. The peel-away sheath was removed. The lead was secured to the skin with silk suture. The sewing sleeve was secured with silk suture. The lead was connected to a St. Jude single-chamber pacemaker, serial number [id number]. The generator was secured to the skin with silk suture. A bandage was placed. The pacemaker was programmed VVI 40 bpm. He was returned to the recovery area in satisfactory condition.  Complications: There were no immediate procedure complications  Conclusion: Successful insertion of a temporary permanent transvenous pacemaker in a patient with complete heart block prior to undergoing dental extraction under general anesthesia and prior to undergoing aortic valve replacement.  Lewayne Bunting, M.D.

## 2014-11-25 NOTE — Progress Notes (Addendum)
Pt had 18 beats VTach.  Cardiology fellow Azeem paged/notified.  No new orders at this time.  Pt asymptomatic, VSS, resting comfortably.  Will continue to monitor pt closely.  Erenest Rasher, RN

## 2014-11-26 ENCOUNTER — Inpatient Hospital Stay (HOSPITAL_COMMUNITY): Payer: Self-pay

## 2014-11-26 ENCOUNTER — Encounter (HOSPITAL_COMMUNITY): Payer: Self-pay | Admitting: Internal Medicine

## 2014-11-26 DIAGNOSIS — I350 Nonrheumatic aortic (valve) stenosis: Secondary | ICD-10-CM

## 2014-11-26 DIAGNOSIS — K08109 Complete loss of teeth, unspecified cause, unspecified class: Secondary | ICD-10-CM

## 2014-11-26 DIAGNOSIS — I5043 Acute on chronic combined systolic (congestive) and diastolic (congestive) heart failure: Secondary | ICD-10-CM

## 2014-11-26 MED ORDER — SODIUM CHLORIDE 0.9 % IR SOLN
200.0000 mL | Status: DC
  Administered 2014-11-26: 200 mL

## 2014-11-26 MED ORDER — SODIUM CHLORIDE 0.9 % IV SOLN
500.0000 mg | Freq: Three times a day (TID) | INTRAVENOUS | Status: DC
  Administered 2014-11-26 – 2014-11-27 (×3): 500 mg via INTRAVENOUS
  Filled 2014-11-26 (×5): qty 500

## 2014-11-26 MED ORDER — CHLORHEXIDINE GLUCONATE 0.12 % MT SOLN
15.0000 mL | Freq: Two times a day (BID) | OROMUCOSAL | Status: DC
  Administered 2014-11-26 – 2014-11-30 (×10): 15 mL via OROMUCOSAL
  Filled 2014-11-26 (×12): qty 15

## 2014-11-26 MED ORDER — HEPARIN SODIUM (PORCINE) 5000 UNIT/ML IJ SOLN
5000.0000 [IU] | Freq: Three times a day (TID) | INTRAMUSCULAR | Status: DC
  Administered 2014-11-29 – 2014-11-30 (×5): 5000 [IU] via SUBCUTANEOUS
  Filled 2014-11-26 (×16): qty 1

## 2014-11-26 NOTE — Progress Notes (Signed)
1 Day Post-Op Procedure(s) (LRB): Extraction of tooth #'s 1,2,3,4,5,6,7,9, 10, 11, 12,13,14,16, 19, 20, 21, 22, 23, 24, 25, 26, 27, 28, 29, 30, 31 with alveoloplasty (N/A) Subjective: Complete dental extraction of 26 teeth Mouth swollen, raw recurrent e coli UTI resistant to cipro,septra- I have started him on iv Imipenem Temp transvenous pacing wire for HB Not ready for AVR tomorrow- will do next week Objective: Vital signs in last 24 hours: Temp:  [97.6 F (36.4 C)-98.5 F (36.9 C)] 97.6 F (36.4 C) (06/30 0610) Pulse Rate:  [0-163] 82 (06/30 0610) Cardiac Rhythm:  [-]  Resp:  [0-40] 18 (06/30 0610) BP: (104-156)/(52-82) 134/52 mmHg (06/30 0610) SpO2:  [0 %-99 %] 97 % (06/30 0610) Arterial Line BP: (135-166)/(53-82) 166/63 mmHg (06/29 1515) Weight:  [219 lb 9.6 oz (99.61 kg)] 219 lb 9.6 oz (99.61 kg) (06/30 0500)  Hemodynamic parameters for last 24 hours:   Intermittent v- pacing Intake/Output from previous day: 06/29 0701 - 06/30 0700 In: 800 [I.V.:800] Out: 1200 [Urine:1100; Blood:100] Intake/Output this shift: Pacing w/ V-wire SEM Mouth swollen , bloody  Lab Results:  Recent Labs  11/24/14 0620  WBC 11.9*  HGB 14.0  HCT 41.3  PLT 154   BMET:  Recent Labs  11/24/14 0620  NA 135  K 4.0  CL 102  CO2 26  GLUCOSE 112*  BUN 24*  CREATININE 1.06  CALCIUM 9.2    PT/INR: No results for input(s): LABPROT, INR in the last 72 hours. ABG    Component Value Date/Time   PHART 7.459* 10/04/2014 0812   HCO3 19.9* 10/04/2014 0812   TCO2 21 10/04/2014 0812   ACIDBASEDEF 3.0* 10/04/2014 0812   O2SAT 79.0 10/04/2014 0812   CBG (last 3)  No results for input(s): GLUCAP in the last 72 hours.  Assessment/Plan: S/P Procedure(s) (LRB): Extraction of tooth #'s 1,2,3,4,5,6,7,9, 10, 11, 12,13,14,16, 19, 20, 21, 22, 23, 24, 25, 26, 27, 28, 29, 30, 31 with alveoloplasty (N/A) AVR next week    LOS: 5 days    [name] III 11/26/2014

## 2014-11-26 NOTE — Care Management Note (Addendum)
Case Management Note  Patient Details  Name: Nicholas Horton MRN: [id number] Date of Birth: 03-23-1976  Subjective/Objective:    Pt admitted with syncope, also has had CHB may need PPM during this admission, with need for AVR, plan for dental extractions on 11/25/14                Action/Plan: PTA pt lived at home- NCM to follow for d/c needs  Expected Discharge Date:   unknown           Expected Discharge Plan:  Home/Self Care  In-House Referral:  Clinical Social Work  Discharge planning Services  CM Consult, [facility]  Post Acute Care Choice:    Choice offered to:     DME Arranged:    DME Agency:     HH Arranged:    HH Agency:     Status of Service:  In process, will continue to follow  Medicare Important Message Given:    Date Medicare IM Given:    Medicare IM give by:    Date Additional Medicare IM Given:    Additional Medicare Important Message give by:     If discussed at Long Length of Stay Meetings, dates discussed:  11/26/14  Additional Comments: CM assessed pt; pt agreed to CM setting up initial appointment at [facility] for PCP/Medication Assistance Initiation.  CM contacted facility, pt will need to call himself and set up appointment post discharge.  CM provide pt with brochure found on line, CM instructed pt to call as soon as he is discharged.  CM provide information on AVS   Cherylann Parr, RN 11/26/2014, 12:01 PM

## 2014-11-26 NOTE — Progress Notes (Signed)
POST OPERATIVE NOTE: POD #1  11/26/2014 Nicholas Horton [id number]  VITALS: BP 134/52 mmHg  Pulse 82  Temp(Src) 97.6 F (36.4 C) (Axillary)  Resp 18  Ht 6\' 2"  (1.88 m)  Wt 219 lb 9.6 oz (99.61 kg)  BMI 28.18 kg/m2  SpO2 97%  LABS:  Lab Results  Component Value Date   WBC 11.9* 11/24/2014   HGB 14.0 11/24/2014   HCT 41.3 11/24/2014   MCV 91.6 11/24/2014   PLT 154 11/24/2014   BMET    Component Value Date/Time   NA 135 11/24/2014 0620   K 4.0 11/24/2014 0620   CL 102 11/24/2014 0620   CO2 26 11/24/2014 0620   GLUCOSE 112* 11/24/2014 0620   BUN 24* 11/24/2014 0620   CREATININE 1.06 11/24/2014 0620   CALCIUM 9.2 11/24/2014 0620   GFRNONAA >60 11/24/2014 0620   GFRAA >60 11/24/2014 0620    No results found for: INR, PROTIME No results found for: PTT   Nicholas Horton is status post extraction of remaining teeth with alveoloplasty in the operating with general anesthesia.  SUBJECTIVE: Patient denies significant oral discomfort. Patient indicates that he does have significant swelling and some difficulty talking.  EXAM: There is evidence of swelling and heme in the mouth. Sutures are intact. Clots are present. Extraoral and intraoral swelling and bruising are noted.   ASSESSMENT: Post operative course is consistent with dental procedures performed in the operating room. The patient is now edentulous.   PLAN: 1. Chlorhexidine rinses twice a day after breakfast and at bedtime. 2. Salt water rinses every 2 hours while awake in between the chlorhexidine rinses. 3. Advance diet as tolerated. 4. Agree with plan to schedule aortic valve replacement next week.   Charlynne Pander, DDS

## 2014-11-26 NOTE — Progress Notes (Signed)
UR Completed. Avriel Kandel, RN, BSN.  [phone] 

## 2014-11-26 NOTE — Progress Notes (Signed)
Patient: Nicholas Horton / Admit Date: 11/21/2014 / Date of Encounter: 11/26/2014, 8:09 AM   Subjective: No CP, SOB, dizziness. Mouth a little sore but not bad.   Objective: Telemetry: brief WCT yesterday night (18 beats) but the beginning beats were preceded by P waves- EP feels this was likely the automatic threshold test. Otherwise tele showed sinus rhythm with intermittent complete heart block with ventricular undersensing Physical Exam: Blood pressure 134/52, pulse 82, temperature 97.6 F (36.4 C), temperature source Axillary, resp. rate 18, height 6\' 2"  (1.88 m), weight 219 lb 9.6 oz (99.61 kg), SpO2 97 %. General: Well developed, well nourished WM, in no acute distress.  Head: Normocephalic, atraumatic, sclera non-icteric, no xanthomas, nares are without discharge. S/p dental extraction with ice packs in place. Neck: JVP not elevated. Lungs: Clear bilaterally to auscultation without wheezes, rales, or rhonchi. Breathing is unlabored. Heart: RRR. Loud systolic murmur over RUSB and entire precordium. No rubs or gallops.  Abdomen: Soft, non-tender, non-distended with normoactive bowel sounds. No rebound/guarding. Extremities: No clubbing or cyanosis. No edema. Distal pedal pulses are 2+ and equal bilaterally. Neuro: Alert and oriented X 3. Moves all extremities spontaneously. Psych: Responds to questions appropriately with a normal affect.  Intake/Output Summary (Last 24 hours) at 11/26/14 0809 Last data filed at 11/26/14 8638  Gross per 24 hour  Intake    800 ml  Output   1200 ml  Net   -400 ml    Inpatient Medications:  . aspirin EC  81 mg Oral Daily  . atorvastatin  40 mg Oral Daily  . furosemide  40 mg Oral BID  . lisinopril  5 mg Oral Daily  . nitrofurantoin (macrocrystal-monohydrate)  100 mg Oral Q12H  . sodium chloride  3 mL Intravenous Q12H  . spironolactone  12.5 mg Oral Daily   Infusions:  . lactated ringers    . lactated ringers 10 mL/hr at 11/25/14 2159     Labs:  Recent Labs  11/24/14 0620  NA 135  K 4.0  CL 102  CO2 26  GLUCOSE 112*  BUN 24*  CREATININE 1.06  CALCIUM 9.2    Recent Labs  11/24/14 0620  WBC 11.9*  HGB 14.0  HCT 41.3  MCV 91.6  PLT 154    Radiology/Studies:  Dg Orthopantogram  11/21/2014   CLINICAL DATA:  Dental abscesses/ cavities.  EXAM: ORTHOPANTOGRAM/PANORAMIC  COMPARISON:  None.  FINDINGS: Blurring motion artifact obscures the teeth in the midline.  In the maxilla, the remaining left incisor and right canine do not have a cavity. In the mandible, the incisors and, right canine, and right medial premolar do not have cavities.  The rest of the remaining teeth have cavities ranging from mild to severe.  Along the residual segments of the right mandibular molar, there is periapical lucency with suspected reactive sclerosis in the adjacent mandible suggesting periapical abscess ; there is sclerosis of the upper portion of the adjacent mandible which may be reactive. There is also periapical lucency associated with the left maxillary molars and premolars.  IMPRESSION: 1. Markedly severe dental cavities. Periapical lucency with surrounding reactive sclerosis associated with the remaining right mandibular molar fragments. Periapical lucency also associated with the remaining left maxillary molar and premolars.   Electronically Signed   By: Gaylyn Rong M.D.   On: 11/21/2014 18:29   Dg Chest 2 View  11/26/2014   CLINICAL DATA:  Status post permanent pacemaker placement, chest soreness.  EXAM: CHEST  2 VIEW  COMPARISON:  Portable chest x-ray dated November 20, 2014  FINDINGS: There has been interval placement of a single electrode permanent pacemaker. The generator overlies the right upper medial pectoral region. The right ventricular lead is in reasonable position radiographically. There is no pneumothorax or pleural effusion. The pulmonary interstitial markings are mildly prominent bilaterally. The cardiac silhouette  is normal in size. The pulmonary vascularity is prominent centrally though stable. The mediastinum is normal in width. The bony thorax exhibits no acute abnormality.  IMPRESSION: No postprocedure complication following permanent pacemaker placement. Low-grade interstitial edema.   Electronically Signed   By: David  [place] M.D.   On: 11/26/2014 07:23   Dg Chest Portable 1 View  11/20/2014   CLINICAL DATA:  Acute onset of left-sided chest pain. Initial encounter.  EXAM: PORTABLE CHEST - 1 VIEW  COMPARISON:  Chest radiograph performed 10/03/2014  FINDINGS: The lungs are well-aerated. Peribronchial thickening is noted. Mild vascular congestion is seen. Mild bibasilar atelectasis is noted. There is no evidence of pleural effusion or pneumothorax.  The cardiomediastinal silhouette is within normal limits. No acute osseous abnormalities are seen.  IMPRESSION: Peribronchial thickening noted. Mild vascular congestion seen. Mild bibasilar atelectasis noted.   Electronically Signed   By: Roanna Raider M.D.   On: 11/20/2014 22:33     Assessment and Plan  8M with history of NICM, bicuspid AV with moderate AS, transient high grade AV block in 09/2014, chronic combined CHF admitted 11/21/14 with syncope and mildly elevated troponin.  1. Moderate to severe aortic stenosis with bicuspid aortic valve - tx from [facility] following admission for syncope with mild trop elevation.Currently no complaints.Continue current meds. TCTS has seen (last on 11/23/14), recommended treatment of UTI before AVR. Will discuss timing of AVR with Dr. [place].  2. Syncope initially felt 2/2 #1 but also with intermittent complete heart block this admission - s/p temp perm pacemaker 11/25/14. Further plans for permanent pacing per EP. Device reprogrammed this morning due to decreased R waves, backup VVI 40. 18 beats of wide complex tachy last night, but some beats preceded by P waves - EP feels this was likely the automatic threshold test. Follow on  tele. F/u lytes in AM.  3.NICM: Cath 10/04/14 w/ minimal CAD, EF 20-25% by V gram @ that time, 43% by MRI and 50-55% by echo this admission. Cont PO lasix.Cont ACEI/spiro.No BB 2/2 h/o conduction abnormality.  4. Nonobstructive ASCAD by cath 09/2014 - on ASA and statin. Troponin felt likely demand ischemia in the process that lead to syncope.  5. Dental Caries & Peridontitis: s/p multiple teeth extractions and alveoloplasty on 11/25/14 by Dr. Kristin Bruins.  6. Persistent leukocytosis, possibly due to E Coli UTI - resistant to many meds, except sensitive to nitrofurantoin thus undergoing treatment x 10 days.  ? Lock fluids and add DVT ppx - will d/w MD.  Thomasene Mohair PA-C Pager: [phone] Patient seen and examined and history reviewed. Agree with above findings and plan. Doing well post dental extraction and placement of temp/perm pacer. Started on imepenam for UTI per surgery. OK to add DVT prophylaxis. Anticipate AVR next week.   Iden Stripling [place], MDFACC 11/26/2014 12:33 PM

## 2014-11-26 NOTE — Progress Notes (Signed)
PA Meng notified and reviewed patient's EKG results this AM.  Pt reports no pain at this time and is resting comfortably, VSS.  No new orders at this time.  Will continue to monitor pt closely.  Erenest Rasher, RN

## 2014-11-26 NOTE — Progress Notes (Signed)
SUBJECTIVE: The patient is doing well today.  At this time, he denies chest pain, shortness of breath, or any new concerns.  Some mouth pain, but controlled with medication.   CURRENT MEDICATIONS: . aspirin EC  81 mg Oral Daily  . atorvastatin  40 mg Oral Daily  . furosemide  40 mg Oral BID  . lisinopril  5 mg Oral Daily  . nitrofurantoin (macrocrystal-monohydrate)  100 mg Oral Q12H  . sodium chloride  3 mL Intravenous Q12H  . spironolactone  12.5 mg Oral Daily   . lactated ringers    . lactated ringers 10 mL/hr at 11/25/14 2159    OBJECTIVE: Physical Exam: Filed Vitals:   11/25/14 2214 11/25/14 2248 11/26/14 0500 11/26/14 0610  BP:  114/58  134/52  Pulse: 75 51  82  Temp:    97.6 F (36.4 C)  TempSrc:    Axillary  Resp:    18  Height:      Weight:   219 lb 9.6 oz (99.61 kg)   SpO2:    97%    Intake/Output Summary (Last 24 hours) at 11/26/14 0802 Last data filed at 11/26/14 7564  Gross per 24 hour  Intake    800 ml  Output   1200 ml  Net   -400 ml    Telemetry reveals sinus rhythm with ventricular undersensing  GEN- The patient is chronically ill appearing, alert and oriented x 3 today.   Head- normocephalic, atraumatic Eyes-  Sclera clear, conjunctiva pink Ears- hearing intact Oropharynx- clear Neck- supple, no JVP Lymph- no cervical lymphadenopathy Lungs- Clear to ausculation bilaterally, normal work of breathing Heart- Regular rate and rhythm, +systolic murmur GI- soft, NT, ND, + BS Extremities- no clubbing, cyanosis, or edema Skin- no rash or lesion Psych- euthymic mood, full affect Neuro- strength and sensation are intact  LABS: Basic Metabolic Panel:  Recent Labs  33/29/51 0620  NA 135  K 4.0  CL 102  CO2 26  GLUCOSE 112*  BUN 24*  CREATININE 1.06  CALCIUM 9.2   CBC:  Recent Labs  11/24/14 0620  WBC 11.9*  HGB 14.0  HCT 41.3  MCV 91.6  PLT 154   RADIOLOGY: Dg Chest 2 View 11/26/2014   CLINICAL DATA:  Status post permanent  pacemaker placement, chest soreness.  EXAM: CHEST  2 VIEW  COMPARISON:  Portable chest x-ray dated November 20, 2014  FINDINGS: There has been interval placement of a single electrode permanent pacemaker. The generator overlies the right upper medial pectoral region. The right ventricular lead is in reasonable position radiographically. There is no pneumothorax or pleural effusion. The pulmonary interstitial markings are mildly prominent bilaterally. The cardiac silhouette is normal in size. The pulmonary vascularity is prominent centrally though stable. The mediastinum is normal in width. The bony thorax exhibits no acute abnormality.  IMPRESSION: No postprocedure complication following permanent pacemaker placement. Low-grade interstitial edema.   Electronically Signed   By: David  [place] M.D.   On: 11/26/2014 07:23   ASSESSMENT AND PLAN:  Principal Problem:   Syncope Active Problems:   Moderate to severe AS with bicuspid valve. Mean 33, peak 61 mmHg.   Bicuspid aortic valve   Tobacco use disorder   Nonischemic cardiomyopathy-EF 50-55% echo 11/20/14   Elevated troponin   Paradentosis   Acute on chronic combined systolic and diastolic CHF    Complete heart block   E. Coli UTI  1.  Intermittent complete heart block S/p temp perm pacemaker implantation 11/25/14  CXR demonstrates lead slightly pulled back, but stable Device interrogation with decreased R waves (reprogrammed today) Device programmed back up VVI 40 Would prefer epicardial pacing system be placed at time of AVR to decrease infection risks long term.   2.  Moderate to severe AS Plan per gen cardiology team/TCTS  3.  NICM Continue medical therapy  Pt will need home health set up for temp-perm dressing changes if surgery is >1 week away and he is discharged to home.  Gypsy Balsam, NP 11/26/2014 8:05 AM  EP Attending  Patient seen and examined. Agree with the findings above. I have reprogrammed her device by increasing  sensitivity and no undersensing now. He is ready for surgery from my perspective. Will touch base with surgical team.   Leonia Reeves.D.

## 2014-11-26 NOTE — Progress Notes (Signed)
Initial Nutrition Assessment  DOCUMENTATION CODES:  Not applicable  INTERVENTION:   (RD to follow for diet advancement, supplement diet as appropriate)  NUTRITION DIAGNOSIS:  Inadequate oral intake related to mouth pain as evidenced by per patient/family report.  GOAL:  Patient will meet greater than or equal to 90% of their needs  MONITOR:  PO intake, Diet advancement, Labs, Weight trends, Skin, I & O's  REASON FOR ASSESSMENT:  Consult Assessment of nutrition requirement/status  ASSESSMENT: 39 yo male with history of non-ischemic cardiomyopathy, bicuspid aortic valve with moderate aortic stenosis, transient high grade AV block, chronic combined systolic and diastolic CHF, former tobacco abuse who was admitted to [facility] early this am after a syncopal episode.  S/p Procedure(s) (LRB) on 11/25/14: Extraction of tooth #'s 1,2,3,4,5,6,7,9, 10, 11, 12,13,14,16, 19, 20, 21, 22, 23, 24, 25, 26, 27, 28, 29, 30, 31 with alveoloplasty (N/A)  Pt admitted for syncope and elevated troponin on 11/21/14 at [facility]. He was transferred to [facility] to be followed more closely on telemetry unit.   Pt sleeping soundly at time of visit. Unable to complete nutrition-focused physical exam, however, pt appears well nourished. Prior to dental extractions, pt was consuming 100% of meals.   Wt is stable.   Pt remains on clear liquids; RD will continue to follow for diet advancement and tolerance, as well as supplement diet as appropriate.   Reviewed dental note from 11/26/14. Pain is well controlled, however, pt with mouth swelling.   Per cardiology, plan for aortic valve replacement next week.  Height:  Ht Readings from Last 1 Encounters:  11/21/14 6\' 2"  (1.88 m)    Weight:  Wt Readings from Last 1 Encounters:  11/26/14 219 lb 9.6 oz (99.61 kg)    Ideal Body Weight:  86.4 kg  Wt Readings from Last 10 Encounters:  11/26/14 219 lb 9.6 oz (99.61 kg)  11/21/14 218 lb 9.6 oz (99.156 kg)  10/14/14  216 lb 8 oz (98.204 kg)  10/07/14 217 lb 2.5 oz (98.5 kg)  10/04/14 219 lb 5.7 oz (99.5 kg)    BMI:  Body mass index is 28.18 kg/(m^2).  Estimated Nutritional Needs:  Kcal:  2000-2200  Protein:  100-110 grams  Fluid:  2.0-2.2 L  Skin:  Reviewed, no issues  Diet Order:  Diet clear liquid Room service appropriate?: No; Fluid consistency:: Thin  EDUCATION NEEDS:  No education needs identified at this time   Intake/Output Summary (Last 24 hours) at 11/26/14 1514 Last data filed at 11/26/14 1159  Gross per 24 hour  Intake      0 ml  Output   2000 ml  Net  -2000 ml    Last BM:  11/24/14  Nicholas Horton A. Mayford Knife, RD, LDN, CDE Pager: [phone] After hours Pager: [phone]

## 2014-11-27 ENCOUNTER — Inpatient Hospital Stay (HOSPITAL_COMMUNITY): Payer: Self-pay

## 2014-11-27 ENCOUNTER — Encounter (HOSPITAL_COMMUNITY): Payer: Self-pay

## 2014-11-27 DIAGNOSIS — K045 Chronic apical periodontitis: Secondary | ICD-10-CM

## 2014-11-27 DIAGNOSIS — I350 Nonrheumatic aortic (valve) stenosis: Secondary | ICD-10-CM

## 2014-11-27 LAB — CBC
HEMATOCRIT: 39.8 % (ref 39.0–52.0)
HEMOGLOBIN: 13.3 g/dL (ref 13.0–17.0)
MCH: 30.7 pg (ref 26.0–34.0)
MCHC: 33.4 g/dL (ref 30.0–36.0)
MCV: 91.9 fL (ref 78.0–100.0)
Platelets: 160 10*3/uL (ref 150–400)
RBC: 4.33 MIL/uL (ref 4.22–5.81)
RDW: 13.1 % (ref 11.5–15.5)
WBC: 15.2 10*3/uL — ABNORMAL HIGH (ref 4.0–10.5)

## 2014-11-27 LAB — BASIC METABOLIC PANEL
Anion gap: 10 (ref 5–15)
BUN: 17 mg/dL (ref 6–20)
CALCIUM: 9.4 mg/dL (ref 8.9–10.3)
CHLORIDE: 100 mmol/L — AB (ref 101–111)
CO2: 28 mmol/L (ref 22–32)
Creatinine, Ser: 0.97 mg/dL (ref 0.61–1.24)
GFR calc non Af Amer: >60 mL/min (ref >60)
Glucose, Bld: 108 mg/dL — ABNORMAL HIGH (ref 65–99)
POTASSIUM: 4 mmol/L (ref 3.5–5.1)
Sodium: 138 mmol/L (ref 135–145)

## 2014-11-27 LAB — MAGNESIUM: MAGNESIUM: 2 mg/dL (ref 1.7–2.4)

## 2014-11-27 MED ORDER — IOHEXOL 300 MG/ML  SOLN
25.0000 mL | INTRAMUSCULAR | Status: AC
  Administered 2014-11-27 (×2): 25 mL via ORAL

## 2014-11-27 MED ORDER — IOHEXOL 300 MG/ML  SOLN
100.0000 mL | Freq: Once | INTRAMUSCULAR | Status: AC | PRN
  Administered 2014-11-27: 100 mL via INTRAVENOUS

## 2014-11-27 MED ORDER — SODIUM CHLORIDE 0.9 % IV SOLN
500.0000 mg | Freq: Four times a day (QID) | INTRAVENOUS | Status: DC
  Administered 2014-11-27 – 2014-12-01 (×16): 500 mg via INTRAVENOUS
  Filled 2014-11-27 (×21): qty 500

## 2014-11-27 NOTE — Progress Notes (Signed)
2 Days Post-Op Procedure(s) (LRB): Extraction of tooth #'s 1,2,3,4,5,6,7,9, 10, 11, 12,13,14,16, 19, 20, 21, 22, 23, 24, 25, 26, 27, 28, 29, 30, 31 with alveoloplasty (N/A) Subjective: Bloody mouth better Temp PM working well AVR planned July 5- mechanical Will scan urinary tract for anatomic abnormality related to recurrent E Coli UTI  Objective: Vital signs in last 24 hours: Temp:  [97.6 F (36.4 C)-98.8 F (37.1 C)] 98.8 F (37.1 C) (07/01 0509) Pulse Rate:  [42-50] 48 (07/01 0509) Cardiac Rhythm:  [-] Heart block (06/30 2200) Resp:  [18-19] 18 (07/01 0509) BP: (106-121)/(41-60) 106/52 mmHg (07/01 0509) SpO2:  [95 %-99 %] 95 % (07/01 0509) Weight:  [213 lb 1.6 oz (96.662 kg)] 213 lb 1.6 oz (96.662 kg) (07/01 0500)  Hemodynamic parameters for last 24 hours:    Intake/Output from previous day: 06/30 0701 - 07/01 0700 In: -  Out: 3400 [Urine:3400] Intake/Output this shift: Total I/O In: -  Out: 250 [Urine:250]  comfortable Lungs clear  SEM unchanged  Lab Results:  Recent Labs  11/27/14 0230  WBC 15.2*  HGB 13.3  HCT 39.8  PLT 160   BMET:  Recent Labs  11/27/14 0230  NA 138  K 4.0  CL 100*  CO2 28  GLUCOSE 108*  BUN 17  CREATININE 0.97  CALCIUM 9.4    PT/INR: No results for input(s): LABPROT, INR in the last 72 hours. ABG    Component Value Date/Time   PHART 7.459* 10/04/2014 0812   HCO3 19.9* 10/04/2014 0812   TCO2 21 10/04/2014 0812   ACIDBASEDEF 3.0* 10/04/2014 0812   O2SAT 79.0 10/04/2014 0812   CBG (last 3)  No results for input(s): GLUCAP in the last 72 hours.  Assessment/Plan: S/P Procedure(s) (LRB): Extraction of tooth #'s 1,2,3,4,5,6,7,9, 10, 11, 12,13,14,16, 19, 20, 21, 22, 23, 24, 25, 26, 27, 28, 29, 30, 31 with alveoloplasty (N/A)  cont IV Imepenum AVR July 5   LOS: 6 days    Nicholas Horton III 11/27/2014

## 2014-11-27 NOTE — Progress Notes (Signed)
POST OPERATIVE NOTE: Postop day #2  11/27/2014 Nicholas Horton [id number]  VITALS: BP 106/52 mmHg  Pulse 48  Temp(Src) 98.8 F (37.1 C) (Oral)  Resp 18  Ht 6\' 2"  (1.88 m)  Wt 213 lb 1.6 oz (96.662 kg)  BMI 27.35 kg/m2  SpO2 95%  LABS:  Lab Results  Component Value Date   WBC 15.2* 11/27/2014   HGB 13.3 11/27/2014   HCT 39.8 11/27/2014   MCV 91.9 11/27/2014   PLT 160 11/27/2014   BMET    Component Value Date/Time   NA 138 11/27/2014 0230   K 4.0 11/27/2014 0230   CL 100* 11/27/2014 0230   CO2 28 11/27/2014 0230   GLUCOSE 108* 11/27/2014 0230   BUN 17 11/27/2014 0230   CREATININE 0.97 11/27/2014 0230   CALCIUM 9.4 11/27/2014 0230   GFRNONAA >60 11/27/2014 0230   GFRAA >60 11/27/2014 0230    No results found for: INR, PROTIME No results found for: PTT   Nicholas Horton is status post extraction remaining teeth with alveoloplasty and operating room on 11/25/2014. Teeth were extracted as part of a pre-aortic valve replacement dental protocol. Patient with anticipated aortic valve replacement next week.  SUBJECTIVE: Patient with minimal discomfort. The patient denies any active bleeding.  EXAM: There is no sign of oral infection. Sutures are intact. Clots are present.  The patient is now edentulous.  ASSESSMENT: Post operative course is consistent with dental procedures performed in the operating room with general anesthesia.. Patient is edentulous.  PLAN: 1. Continue chlorhexidine rinses twice a day. 2. Continue salt water rinses as needed in between the chlorhexidine rinses. 3. Advance diet to full liquid and then to soft diet as tolerated. 4. Continue nutritional assessment and advancement of diet at this time. 5. Patient cleared for aortic valve replacement early next week with Dr. Donata Clay.    Charlynne Pander, DDS

## 2014-11-27 NOTE — Progress Notes (Signed)
Patient: Nicholas Horton / Admit Date: 11/21/2014 / Date of Encounter: 11/27/2014, 9:44 AM   Subjective: No CP, SOB, dizziness. Mouth is sore but not bad.   Objective: Telemetry:  shows sinus rhythm with intermittent complete heart block with ventricular pacing Physical Exam: Blood pressure 106/52, pulse 48, temperature 98.8 F (37.1 C), temperature source Oral, resp. rate 18, height 6\' 2"  (1.88 m), weight 96.662 kg (213 lb 1.6 oz), SpO2 95 %. General: Well developed, well nourished WM, in no acute distress.  Head: Normocephalic, atraumatic, sclera non-icteric, no xanthomas, nares are without discharge. S/p dental extraction with some swelling Neck: JVP not elevated. Lungs: Clear bilaterally to auscultation without wheezes, rales, or rhonchi. Breathing is unlabored. Heart: RRR. Loud systolic murmur over RUSB and entire precordium. No rubs or gallops.  Abdomen: Soft, non-tender, non-distended with normoactive bowel sounds. No rebound/guarding. Extremities: No clubbing or cyanosis. No edema. Distal pedal pulses are 2+ and equal bilaterally. Neuro: Alert and oriented X 3. Moves all extremities spontaneously. Psych: Responds to questions appropriately with a normal affect.  Intake/Output Summary (Last 24 hours) at 11/27/14 0944 Last data filed at 11/27/14 0841  Gross per 24 hour  Intake      0 ml  Output   3650 ml  Net  -3650 ml    Inpatient Medications:  . aspirin EC  81 mg Oral Daily  . atorvastatin  40 mg Oral Daily  . chlorhexidine  15 mL Mouth/Throat BID  . furosemide  40 mg Oral BID  . heparin subcutaneous  5,000 Units Subcutaneous 3 times per day  . imipenem-cilastatin  500 mg Intravenous 3 times per day  . lisinopril  5 mg Oral Daily  . sodium chloride  3 mL Intravenous Q12H  . spironolactone  12.5 mg Oral Daily   Infusions:  . lactated ringers 10 mL/hr at 11/25/14 2159  . sodium chloride irrigation      Labs:  Recent Labs  11/27/14 0230  NA 138  K 4.0  CL  100*  CO2 28  GLUCOSE 108*  BUN 17  CREATININE 0.97  CALCIUM 9.4  MG 2.0    Recent Labs  11/27/14 0230  WBC 15.2*  HGB 13.3  HCT 39.8  MCV 91.9  PLT 160    Radiology/Studies:  Dg Orthopantogram  11/21/2014   CLINICAL DATA:  Dental abscesses/ cavities.  EXAM: ORTHOPANTOGRAM/PANORAMIC  COMPARISON:  None.  FINDINGS: Blurring motion artifact obscures the teeth in the midline.  In the maxilla, the remaining left incisor and right canine do not have a cavity. In the mandible, the incisors and, right canine, and right medial premolar do not have cavities.  The rest of the remaining teeth have cavities ranging from mild to severe.  Along the residual segments of the right mandibular molar, there is periapical lucency with suspected reactive sclerosis in the adjacent mandible suggesting periapical abscess ; there is sclerosis of the upper portion of the adjacent mandible which may be reactive. There is also periapical lucency associated with the left maxillary molars and premolars.  IMPRESSION: 1. Markedly severe dental cavities. Periapical lucency with surrounding reactive sclerosis associated with the remaining right mandibular molar fragments. Periapical lucency also associated with the remaining left maxillary molar and premolars.   Electronically Signed   By: Gaylyn Rong M.D.   On: 11/21/2014 18:29   Dg Chest 2 View  11/26/2014   CLINICAL DATA:  Status post permanent pacemaker placement, chest soreness.  EXAM: CHEST  2 VIEW  COMPARISON:  Portable  chest x-ray dated November 20, 2014  FINDINGS: There has been interval placement of a single electrode permanent pacemaker. The generator overlies the right upper medial pectoral region. The right ventricular lead is in reasonable position radiographically. There is no pneumothorax or pleural effusion. The pulmonary interstitial markings are mildly prominent bilaterally. The cardiac silhouette is normal in size. The pulmonary vascularity is prominent  centrally though stable. The mediastinum is normal in width. The bony thorax exhibits no acute abnormality.  IMPRESSION: No postprocedure complication following permanent pacemaker placement. Low-grade interstitial edema.   Electronically Signed   By: David  [place] M.D.   On: 11/26/2014 07:23   Ct Chest Wo Contrast  11/26/2014   CLINICAL DATA:  Aortic stenosis, shortness of breath.  EXAM: CT CHEST WITHOUT CONTRAST  TECHNIQUE: Multidetector CT imaging of the chest was performed following the standard protocol without IV contrast.  COMPARISON:  Chest radiograph of same day.  FINDINGS: No pneumothorax or pleural effusion is noted. Right internal jugular pacemaker is noted. Minimal bilateral posterior basilar subsegmental atelectasis is noted. Mild airspace opacity is noted posteriorly in left upper lobe concerning for pneumonia. Significant calcifications of aortic valve are noted suggesting underlying aortic stenosis. No significant mediastinal adenopathy is noted on these unenhanced images. Visualized portion of upper abdomen appears normal. No significant osseous abnormality is noted.  IMPRESSION: Significant calcifications of aortic valve are noted suggesting underlying aortic stenosis.  Minimal bilateral posterior basilar subsegmental atelectasis is noted.  Mild airspace opacity is noted posteriorly in the left upper lobe concerning for pneumonia. Followup radiographs are recommended to ensure resolution.   Electronically Signed   By: Lupita Raider, M.D.   On: 11/26/2014 15:09   Dg Chest Portable 1 View  11/20/2014   CLINICAL DATA:  Acute onset of left-sided chest pain. Initial encounter.  EXAM: PORTABLE CHEST - 1 VIEW  COMPARISON:  Chest radiograph performed 10/03/2014  FINDINGS: The lungs are well-aerated. Peribronchial thickening is noted. Mild vascular congestion is seen. Mild bibasilar atelectasis is noted. There is no evidence of pleural effusion or pneumothorax.  The cardiomediastinal silhouette is  within normal limits. No acute osseous abnormalities are seen.  IMPRESSION: Peribronchial thickening noted. Mild vascular congestion seen. Mild bibasilar atelectasis noted.   Electronically Signed   By: Roanna Raider M.D.   On: 11/20/2014 22:33     Assessment and Plan  55M with history of NICM, bicuspid AV with moderate AS, transient high grade AV block in 09/2014, chronic combined CHF admitted 11/21/14 with syncope and mildly elevated troponin.  1. Moderate to severe aortic stenosis with bicuspid aortic valve - tx from [facility] following admission for syncope with mild trop elevation.Currently no complaints.Continue current meds. TCTS has seen (last on 11/23/14), recommended treatment of UTI before AVR. Anticipate AVR early next week.   2. Syncope initially felt 2/2 #1 but also with intermittent complete heart block this admission - s/p temp perm pacemaker 11/25/14. Further plans for permanent pacing per EP.   3.NICM: Cath 10/04/14 w/ minimal CAD, EF 20-25% by V gram @ that time, 43% by MRI and 50-55% by echo this admission. Cont PO lasix.Cont ACEI/spiro.No BB 2/2 h/o conduction abnormality.  4. Nonobstructive ASCAD by cath 09/2014 - on ASA and statin. Troponin felt likely demand ischemia in the process that lead to syncope.  5. Dental Caries & Peridontitis: s/p multiple teeth extractions and alveoloplasty on 11/25/14 by Dr. Kristin Bruins.  6. Persistent leukocytosis, possibly due to E Coli UTI - was started on nitrofurantion on admission  but CT surgery changed to IV imipenam.    Signed,  Peter Sw[place]iland, MDFACC 11/27/2014 9:44 AM

## 2014-11-28 DIAGNOSIS — R55 Syncope and collapse: Secondary | ICD-10-CM

## 2014-11-28 LAB — CBC
HEMATOCRIT: 38.3 % — AB (ref 39.0–52.0)
Hemoglobin: 13 g/dL (ref 13.0–17.0)
MCH: 30.7 pg (ref 26.0–34.0)
MCHC: 33.9 g/dL (ref 30.0–36.0)
MCV: 90.5 fL (ref 78.0–100.0)
Platelets: 159 10*3/uL (ref 150–400)
RBC: 4.23 MIL/uL (ref 4.22–5.81)
RDW: 13 % (ref 11.5–15.5)
WBC: 11.7 10*3/uL — ABNORMAL HIGH (ref 4.0–10.5)

## 2014-11-28 NOTE — Progress Notes (Signed)
CARDIAC REHAB PHASE I   PRE:  Rate/Rhythm: 86 HB  BP:   Standing: 104/57     SaO2: 97% 2L  MODE:  Ambulation: 582ft   POST:  Rate/Rhythm: 106 SR  BP:   Sitting: 124/62     SaO2: 96%RA   Pt walked 5100ft without complications. Pt was on RA and SaO2 remained over 90% while walking. Returned pt to bed with VSS and on RA. Provided education for pre op. Discussed importance of mobility for pre and post op. Pt was unable to IS due to teeth extractions, will try post op once gums are healed.  [id number] Nicholas Carver D Belinda Schlichting,MS,ACSM-RCEP 11/28/2014 4:20 PM

## 2014-11-28 NOTE — Progress Notes (Signed)
3 Days Post-Op Procedure(s) (LRB): Extraction of tooth #'s 1,2,3,4,5,6,7,9, 10, 11, 12,13,14,16, 19, 20, 21, 22, 23, 24, 25, 26, 27, 28, 29, 30, 31 with alveoloplasty (N/A) Subjective: Gum edema better Afebrile Evaluation of urologic tract with CT scan of abdomen and pelvis shows no abnormalities other than a very small right kidney stone Temporary right ventricle pacing wire functioning well  Objective: Vital signs in last 24 hours: Temp:  [97.8 F (36.6 C)-98.1 F (36.7 C)] 97.8 F (36.6 C) (07/02 1335) Pulse Rate:  [46-52] 46 (07/02 1335) Cardiac Rhythm:  [-] Heart block (07/02 0800) Resp:  [20-21] 20 (07/02 1335) BP: (110-112)/(52-57) 112/57 mmHg (07/02 1335) SpO2:  [94 %-97 %] 94 % (07/02 1335) Weight:  [211 lb (95.709 kg)] 211 lb (95.709 kg) (07/02 0443)  Hemodynamic parameters for last 24 hours:   stable  Intake/Output from previous day: 07/01 0701 - 07/02 0700 In: 2440 [P.O.:2440] Out: 3175 [Urine:3175] Intake/Output this shift: Total I/O In: 240 [P.O.:240] Out: 700 [Urine:700]  Minimal bleeding from mouth Lungs clear Stable systolic ejection murmur  Lab Results:  Recent Labs  11/27/14 0230 11/28/14 0406  WBC 15.2* 11.7*  HGB 13.3 13.0  HCT 39.8 38.3*  PLT 160 159   BMET:  Recent Labs  11/27/14 0230  NA 138  K 4.0  CL 100*  CO2 28  GLUCOSE 108*  BUN 17  CREATININE 0.97  CALCIUM 9.4    PT/INR: No results for input(s): LABPROT, INR in the last 72 hours. ABG    Component Value Date/Time   PHART 7.459* 10/04/2014 0812   HCO3 19.9* 10/04/2014 0812   TCO2 21 10/04/2014 0812   ACIDBASEDEF 3.0* 10/04/2014 0812   O2SAT 79.0 10/04/2014 0812   CBG (last 3)  No results for input(s): GLUCAP in the last 72 hours.  Assessment/Plan: S/P Procedure(s) (LRB): Extraction of tooth #'s 1,2,3,4,5,6,7,9, 10, 11, 12,13,14,16, 19, 20, 21, 22, 23, 24, 25, 26, 27, 28, 29, 30, 31 with alveoloplasty (N/A)  Recovering from total dental extraction Escherichia  coli UTI being treated with IV Primaxin AVR and implantation of permanent epicardial pacing leads plan for July 5   LOS: 7 days    Nicholas Horton 11/28/2014

## 2014-11-28 NOTE — Progress Notes (Signed)
Primary cardiologist: Dr. [name]  Seen for followup: Aortic stenosis, cardiomyopathy  Subjective:    Mouth swelling improving, has been able to drink and eat soft foods. No chest pain or shortness of breath.  Objective:   Temp:  [98.1 F (36.7 C)] 98.1 F (36.7 C) (07/02 0443) Pulse Rate:  [48-92] 48 (07/02 0443) Resp:  [20-21] 21 (07/02 0443) BP: (105-111)/(50-56) 111/56 mmHg (07/02 0443) SpO2:  [94 %-97 %] 94 % (07/02 0443) Weight:  [211 lb (95.709 kg)] 211 lb (95.709 kg) (07/02 0443) Last BM Date: 11/24/14  Filed Weights   11/26/14 0500 11/27/14 0500 11/28/14 0443  Weight: 219 lb 9.6 oz (99.61 kg) 213 lb 1.6 oz (96.662 kg) 211 lb (95.709 kg)    Intake/Output Summary (Last 24 hours) at 11/28/14 1041 Last data filed at 11/28/14 1000  Gross per 24 hour  Intake   2200 ml  Output   3275 ml  Net  -1075 ml    Telemetry: Complete heart block with pacing.  Exam:  General: Appears comfortable at rest.  HEENT: Status post multiple tooth extractions.  Lungs: Clear, nonlabored.  Cardiac: RRR with systolic murmur at right upper sternal border.  Abdomen: NABS.  Extremities: No pitting edema.  Lab Results:  Basic Metabolic Panel:  Recent Labs Lab 11/22/14 0245 11/24/14 0620 11/27/14 0230  NA 139 135 138  K 3.8 4.0 4.0  CL 102 102 100*  CO2 GLUCOSE 100* 112* 108*  BUN 21* 24* 17  CREATININE 0.96 1.06 0.97  CALCIUM 9.7 9.2 9.4  MG  --   --  2.0    CBC:  Recent Labs Lab 11/24/14 0620 11/27/14 0230 11/28/14 0406  WBC 11.9* 15.2* 11.7*  HGB 14.0 13.3 13.0  HCT 41.3 39.8 38.3*  MCV 91.6 91.9 90.5  PLT 154 160 159    Echocardiogram 11/20/2014: Study Conclusions  - Left ventricle: The cavity size was mildly dilated. Systolic function was normal. The estimated ejection fraction was in the range of 50% to 55%. Wall motion was normal; there were no regional wall motion abnormalities. - Aortic valve: Transvalvular velocity  was increased. There was moderate to severe stenosis. There was mild regurgitation. Peak velocity (S): 390 cm/s. Mean gradient (S): 33 mm Hg. Peak gradient (S): 61 mm Hg. - Mitral valve: There was mild regurgitation. - Left atrium: The atrium was mildly dilated. - Right ventricle: Systolic function was normal. - Pulmonary arteries: Systolic pressure was within the normal range.   Medications:   Scheduled Medications: . aspirin EC  81 mg Oral Daily  . atorvastatin  40 mg Oral Daily  . chlorhexidine  15 mL Mouth/Throat BID  . furosemide  40 mg Oral BID  . heparin subcutaneous  5,000 Units Subcutaneous 3 times per day  . imipenem-cilastatin  500 mg Intravenous 4 times per day  . lisinopril  5 mg Oral Daily  . sodium chloride  3 mL Intravenous Q12H  . spironolactone  12.5 mg Oral Daily     Infusions: . lactated ringers 10 mL/hr at 11/25/14 2159  . sodium chloride irrigation       PRN Medications:  sodium chloride, acetaminophen, morphine injection, ondansetron (ZOFRAN) IV, oxyCODONE-acetaminophen, sodium chloride   Assessment:   1. Moderate to severe aortic stenosis with bicuspid aortic valve. Anticipate AVR early next week following treatment for UTI.  2. Syncope in the setting of aortic stenosis and intermittent complete heart block this admission - s/p temp perm pacemaker 11/25/14.  Further plans for permanent pacing per EP. Dr. Ladona Ridgel suggests epicardial pacing system placed the time of AVR.  3.NICM: Cath 10/04/14 w/ minimal CAD, EF 20-25% by V gram @ that time, 43% by MRI and 50-55% by echo this admission. Cont PO lasix.Cont ACEI/spiro.No BB 2/2 h/o conduction abnormality.  4. Nonobstructive ASCAD by cath 09/2014 - on ASA and statin. Troponin felt likely demand ischemia in the process that lead to syncope.  5. Dental Caries & Peridontitis: s/p multiple teeth extractions and alveoloplasty on 11/25/14 by Dr. Kristin Bruins.   Plan/Discussion:    No change to current  cardiac regimen. AVR anticipated for July 5.   Jonelle Sidle, M.D., F.A.C.C.

## 2014-11-29 DIAGNOSIS — R7989 Other specified abnormal findings of blood chemistry: Secondary | ICD-10-CM

## 2014-11-29 LAB — BASIC METABOLIC PANEL
ANION GAP: 12 (ref 5–15)
BUN: 24 mg/dL — ABNORMAL HIGH (ref 6–20)
CO2: 29 mmol/L (ref 22–32)
Calcium: 9.7 mg/dL (ref 8.9–10.3)
Chloride: 97 mmol/L — ABNORMAL LOW (ref 101–111)
Creatinine, Ser: 1.04 mg/dL (ref 0.61–1.24)
GFR calc Af Amer: >60 mL/min (ref >60)
GFR calc non Af Amer: >60 mL/min (ref >60)
Glucose, Bld: 123 mg/dL — ABNORMAL HIGH (ref 65–99)
Potassium: 4 mmol/L (ref 3.5–5.1)
SODIUM: 138 mmol/L (ref 135–145)

## 2014-11-29 LAB — CBC
HCT: 36.1 % — ABNORMAL LOW (ref 39.0–52.0)
Hemoglobin: 12.3 g/dL — ABNORMAL LOW (ref 13.0–17.0)
MCH: 30.9 pg (ref 26.0–34.0)
MCHC: 34.1 g/dL (ref 30.0–36.0)
MCV: 90.7 fL (ref 78.0–100.0)
PLATELETS: 178 10*3/uL (ref 150–400)
RBC: 3.98 MIL/uL — AB (ref 4.22–5.81)
RDW: 12.9 % (ref 11.5–15.5)
WBC: 13.1 10*3/uL — ABNORMAL HIGH (ref 4.0–10.5)

## 2014-11-29 NOTE — Progress Notes (Signed)
Primary cardiologist: Dr. [name]  Seen for followup: Aortic stenosis, cardiomyopathy  Subjective:    No chest pain or shortness of breath. Gum swelling continues to improve slowly.  Objective:   Temp:  [97.7 F (36.5 C)-98 F (36.7 C)] 97.7 F (36.5 C) (07/03 0540) Pulse Rate:  [41-47] 41 (07/03 0540) Resp:  [18-20] 18 (07/03 0540) BP: (107-112)/(49-57) 111/51 mmHg (07/03 0540) SpO2:  [94 %-95 %] 94 % (07/03 0540) Weight:  [211 lb 6.4 oz (95.89 kg)] 211 lb 6.4 oz (95.89 kg) (07/03 0540) Last BM Date: 11/24/14  Filed Weights   11/27/14 0500 11/28/14 0443 11/29/14 0540  Weight: 213 lb 1.6 oz (96.662 kg) 211 lb (95.709 kg) 211 lb 6.4 oz (95.89 kg)    Intake/Output Summary (Last 24 hours) at 11/29/14 0900 Last data filed at 11/28/14 2220  Gross per 24 hour  Intake    240 ml  Output   1425 ml  Net  -1185 ml    Telemetry: Complete heart block with pacing.  Exam:  General: Appears comfortable at rest.  HEENT: Status post multiple tooth extractions.  Lungs: Clear, nonlabored.  Cardiac: RRR with systolic murmur at right upper sternal border.  Abdomen: NABS.  Extremities: No pitting edema.  Lab Results:  Basic Metabolic Panel:  Recent Labs Lab 11/24/14 0620 11/27/14 0230 11/29/14 0310  NA 135 138 138  K 4.0 4.0 4.0  CL 102 100* 97*  CO2 GLUCOSE 112* 108* 123*  BUN 24* 17 24*  CREATININE 1.06 0.97 1.04  CALCIUM 9.2 9.4 9.7  MG  --  2.0  --     CBC:  Recent Labs Lab 11/27/14 0230 11/28/14 0406 11/29/14 0310  WBC 15.2* 11.7* 13.1*  HGB 13.3 13.0 12.3*  HCT 39.8 38.3* 36.1*  MCV 91.9 90.5 90.7  PLT 160 159 178    Echocardiogram 11/20/2014: Study Conclusions  - Left ventricle: The cavity size was mildly dilated. Systolic function was normal. The estimated ejection fraction was in the range of 50% to 55%. Wall motion was normal; there were no regional wall motion abnormalities. - Aortic valve: Transvalvular velocity  was increased. There was moderate to severe stenosis. There was mild regurgitation. Peak velocity (S): 390 cm/s. Mean gradient (S): 33 mm Hg. Peak gradient (S): 61 mm Hg. - Mitral valve: There was mild regurgitation. - Left atrium: The atrium was mildly dilated. - Right ventricle: Systolic function was normal. - Pulmonary arteries: Systolic pressure was within the normal range.   Medications:   Scheduled Medications: . aspirin EC  81 mg Oral Daily  . atorvastatin  40 mg Oral Daily  . chlorhexidine  15 mL Mouth/Throat BID  . furosemide  40 mg Oral BID  . heparin subcutaneous  5,000 Units Subcutaneous 3 times per day  . imipenem-cilastatin  500 mg Intravenous 4 times per day  . lisinopril  5 mg Oral Daily  . sodium chloride  3 mL Intravenous Q12H  . spironolactone  12.5 mg Oral Daily    Infusions: . lactated ringers 10 mL/hr at 11/25/14 2159  . sodium chloride irrigation      PRN Medications: sodium chloride, acetaminophen, morphine injection, ondansetron (ZOFRAN) IV, oxyCODONE-acetaminophen, sodium chloride   Assessment:   1. Moderate to severe aortic stenosis with bicuspid aortic valve. Anticipate AVR July 5.  2. Syncope in the setting of aortic stenosis and intermittent complete heart block this admission - s/p temp perm pacemaker 11/25/14. Further plans for permanent pacing per  EP. Dr. Ladona Ridgel suggests epicardial pacing system placed the time of AVR.  3.NICM: Cath 10/04/14 w/ minimal CAD, EF 20-25% by V gram @ that time, 43% by MRI and 50-55% by echo this admission. Cont PO lasix.Cont ACEI/spiro.No BB 2/2 h/o conduction abnormality.  4. Nonobstructive ASCAD by cath 09/2014 - on ASA and statin. Troponin felt likely demand ischemia in the process that lead to syncope.  5. Dental Caries & Peridontitis: s/p multiple teeth extractions and alveoloplasty on 11/25/14 by Dr. Kristin Bruins. Gum swelling improving.   Plan/Discussion:    No change to current cardiac regimen.  AVR anticipated for July 5.   Jonelle Sidle, M.D., F.A.C.C.

## 2014-11-29 NOTE — Progress Notes (Signed)
Was notified by central telemetry that pacer is spiking before the QRS and in the T waves. Heart rate has been sustaining above 40 bmp, which the pacemaker rate is set to 40.  Patient is not complaining of any pain and is asymptomatic.Patient is resting comfortably. Will continue to monitor.    Marah Park, RN

## 2014-11-30 ENCOUNTER — Inpatient Hospital Stay (HOSPITAL_COMMUNITY): Payer: Self-pay

## 2014-11-30 LAB — BLOOD GAS, ARTERIAL
Acid-Base Excess: 2.6 mmol/L — ABNORMAL HIGH (ref 0.0–2.0)
Bicarbonate: 25.9 mEq/L — ABNORMAL HIGH (ref 20.0–24.0)
Drawn by: 225631
FIO2: 0.21 %
O2 Saturation: 97.2 %
Patient temperature: 98.6
TCO2: 27 mmol/L (ref 0–100)
pCO2 arterial: 34.9 mmHg — ABNORMAL LOW (ref 35.0–45.0)
pH, Arterial: 7.484 — ABNORMAL HIGH (ref 7.350–7.450)
pO2, Arterial: 90.9 mmHg (ref 80.0–100.0)

## 2014-11-30 LAB — URINALYSIS, ROUTINE W REFLEX MICROSCOPIC
Bilirubin Urine: NEGATIVE
Glucose, UA: NEGATIVE mg/dL
Hgb urine dipstick: NEGATIVE
Ketones, ur: NEGATIVE mg/dL
Leukocytes, UA: NEGATIVE
Nitrite: NEGATIVE
Protein, ur: NEGATIVE mg/dL
Specific Gravity, Urine: 1.015 (ref 1.005–1.030)
Urobilinogen, UA: 0.2 mg/dL (ref 0.0–1.0)
pH: 7 (ref 5.0–8.0)

## 2014-11-30 LAB — COMPREHENSIVE METABOLIC PANEL
ALT: 14 U/L — ABNORMAL LOW (ref 17–63)
AST: 19 U/L (ref 15–41)
Albumin: 3.6 g/dL (ref 3.5–5.0)
Alkaline Phosphatase: 65 U/L (ref 38–126)
Anion gap: 11 (ref 5–15)
BUN: 22 mg/dL — ABNORMAL HIGH (ref 6–20)
CO2: 26 mmol/L (ref 22–32)
Calcium: 9.3 mg/dL (ref 8.9–10.3)
Chloride: 99 mmol/L — ABNORMAL LOW (ref 101–111)
Creatinine, Ser: 1.06 mg/dL (ref 0.61–1.24)
GFR calc Af Amer: >60 mL/min (ref >60)
GFR calc non Af Amer: >60 mL/min (ref >60)
Glucose, Bld: 159 mg/dL — ABNORMAL HIGH (ref 65–99)
Potassium: 3.8 mmol/L (ref 3.5–5.1)
Sodium: 136 mmol/L (ref 135–145)
Total Bilirubin: 1.2 mg/dL (ref 0.3–1.2)
Total Protein: 7.4 g/dL (ref 6.5–8.1)

## 2014-11-30 LAB — CBC
HEMATOCRIT: 38.4 % — AB (ref 39.0–52.0)
Hemoglobin: 12.9 g/dL — ABNORMAL LOW (ref 13.0–17.0)
MCH: 31 pg (ref 26.0–34.0)
MCHC: 33.6 g/dL (ref 30.0–36.0)
MCV: 92.3 fL (ref 78.0–100.0)
Platelets: 178 10*3/uL (ref 150–400)
RBC: 4.16 MIL/uL — AB (ref 4.22–5.81)
RDW: 12.9 % (ref 11.5–15.5)
WBC: 10.3 10*3/uL (ref 4.0–10.5)

## 2014-11-30 LAB — SURGICAL PCR SCREEN
MRSA, PCR: NEGATIVE
Staphylococcus aureus: NEGATIVE

## 2014-11-30 LAB — ABO/RH: ABO/RH(D): A NEG

## 2014-11-30 LAB — APTT: aPTT: 34 seconds (ref 24–37)

## 2014-11-30 LAB — PREPARE RBC (CROSSMATCH)

## 2014-11-30 MED ORDER — CHLORHEXIDINE GLUCONATE 4 % EX LIQD
60.0000 mL | Freq: Once | CUTANEOUS | Status: AC
  Administered 2014-11-30: 4 via TOPICAL
  Filled 2014-11-30: qty 60

## 2014-11-30 MED ORDER — PHENYLEPHRINE HCL 10 MG/ML IJ SOLN
30.0000 ug/min | INTRAVENOUS | Status: AC
  Administered 2014-12-01 (×2): 50 ug/min via INTRAVENOUS
  Filled 2014-11-30: qty 2

## 2014-11-30 MED ORDER — DEXTROSE 5 % IV SOLN
750.0000 mg | INTRAVENOUS | Status: DC
  Filled 2014-11-30: qty 750

## 2014-11-30 MED ORDER — SODIUM CHLORIDE 0.9 % IV SOLN
INTRAVENOUS | Status: AC
  Administered 2014-12-01: 14 mL/h via INTRAVENOUS
  Filled 2014-11-30: qty 40

## 2014-11-30 MED ORDER — EPINEPHRINE HCL 1 MG/ML IJ SOLN
0.0000 ug/min | INTRAVENOUS | Status: DC
  Filled 2014-11-30: qty 4

## 2014-11-30 MED ORDER — PLASMA-LYTE 148 IV SOLN
INTRAVENOUS | Status: AC
  Administered 2014-12-01: 500 mL
  Filled 2014-11-30: qty 2.5

## 2014-11-30 MED ORDER — MAGNESIUM SULFATE 50 % IJ SOLN
40.0000 meq | INTRAMUSCULAR | Status: DC
  Filled 2014-11-30: qty 10

## 2014-11-30 MED ORDER — SODIUM CHLORIDE 0.9 % IV SOLN
INTRAVENOUS | Status: AC
  Administered 2014-12-01: .8 [IU]/h via INTRAVENOUS
  Filled 2014-11-30: qty 2.5

## 2014-11-30 MED ORDER — CHLORHEXIDINE GLUCONATE 4 % EX LIQD
60.0000 mL | Freq: Once | CUTANEOUS | Status: AC
  Administered 2014-12-01: 4 via TOPICAL
  Filled 2014-11-30 (×2): qty 60

## 2014-11-30 MED ORDER — METOPROLOL TARTRATE 12.5 MG HALF TABLET
12.5000 mg | ORAL_TABLET | Freq: Once | ORAL | Status: AC
  Administered 2014-12-01: 12.5 mg via ORAL
  Filled 2014-11-30: qty 1

## 2014-11-30 MED ORDER — SODIUM CHLORIDE 0.9 % IV SOLN
INTRAVENOUS | Status: DC
  Filled 2014-11-30: qty 30

## 2014-11-30 MED ORDER — DEXTROSE 5 % IV SOLN
1.5000 g | INTRAVENOUS | Status: AC
  Administered 2014-12-01: 1.5 g via INTRAVENOUS
  Filled 2014-11-30: qty 1.5

## 2014-11-30 MED ORDER — DIAZEPAM 5 MG PO TABS: 5.0000 mg | ORAL_TABLET | ORAL | Status: DC | PRN

## 2014-11-30 MED ORDER — POTASSIUM CHLORIDE 2 MEQ/ML IV SOLN
80.0000 meq | INTRAVENOUS | Status: DC
  Filled 2014-11-30: qty 40

## 2014-11-30 MED ORDER — DIAZEPAM 5 MG PO TABS
5.0000 mg | ORAL_TABLET | Freq: Once | ORAL | Status: AC
  Administered 2014-12-01: 5 mg via ORAL
  Filled 2014-11-30: qty 1

## 2014-11-30 MED ORDER — DEXMEDETOMIDINE HCL IN NACL 400 MCG/100ML IV SOLN
0.1000 ug/kg/h | INTRAVENOUS | Status: AC
  Administered 2014-12-01: .2 ug/kg/h via INTRAVENOUS
  Filled 2014-11-30: qty 100

## 2014-11-30 MED ORDER — TEMAZEPAM 15 MG PO CAPS: 15.0000 mg | ORAL_CAPSULE | Freq: Once | ORAL | Status: AC | PRN

## 2014-11-30 MED ORDER — DOPAMINE-DEXTROSE 3.2-5 MG/ML-% IV SOLN
0.0000 ug/kg/min | INTRAVENOUS | Status: AC
  Administered 2014-12-01: 3 ug/kg/min via INTRAVENOUS
  Filled 2014-11-30: qty 250

## 2014-11-30 MED ORDER — NITROGLYCERIN IN D5W 200-5 MCG/ML-% IV SOLN
2.0000 ug/min | INTRAVENOUS | Status: DC
  Filled 2014-11-30: qty 250

## 2014-11-30 MED ORDER — BISACODYL 5 MG PO TBEC
5.0000 mg | DELAYED_RELEASE_TABLET | Freq: Once | ORAL | Status: DC
  Filled 2014-11-30: qty 1

## 2014-11-30 MED ORDER — VANCOMYCIN HCL 10 G IV SOLR
1500.0000 mg | INTRAVENOUS | Status: AC
  Administered 2014-12-01: 1500 mg via INTRAVENOUS
  Filled 2014-11-30: qty 1500

## 2014-11-30 NOTE — Progress Notes (Signed)
Patient Name: Nicholas Horton      SUBJECTIVE:  past medical history significant for NSTEMI May 2016. Cath then reveled minor CAD and severe LVD with an EF of 20-25%. He was also noted to have transient CHB and a bicuspid AOV with moderate to severe AS. He was treated with a T-TVDP then till his rhythm stabilized. The conduction abnormality normalized. He was seen by EP (Dr. Ladona Ridgel) and it was felt pemanent pacemaker was not indicated then. Cardiac MRI 10/05/14 did not suggest infiltrative disease. LVEF noted to be 43% by echo. He was diuresed and had symptomatic improvement. Troponin peaked at 0.19. Outpatient echo 11/20/14 showed normal LV systolic function (LVEF=50-55%), moderately severe AS with mean gradient of 33 mmHg and peak gradient of 61 mmHg. He was last seen in the office by Dr. Herbie Baltimore 10/14/14.  He was admitted through the ED 11/21/14 to the hospitalist service after a syncopal event while playing cards. He states that he "blacked out" for at least a few seconds. He has noted abdominal fullness over the last few days but no dyspnea. No LE edema. His chest wall has been sore. He denies any palpitations or dizziness preceding the syncopal event. He is currently in sinus rhythm. He was noted to have a long PR interval. Troponin is 0.38. Since admission he was seen by Dr Dorris Fetch who felt AVR was indicated but that the pt would need dental extraction prior to this.  He underwent TTVP implant prior to extraction  AVR scheduled this week  Past Medical History  Diagnosis Date  . Congenital bicuspid aortic valve 09/2014  . Moderate aortic stenosis by prior echocardiogram 10/04/2014    ; possibly severe. Mean gradient and cardiac catheterization lab of 32 mmHg.  Marland Kitchen Nonischemic cardiomyopathy     last EF 50-55%   . Third degree heart block 10/04/2014    Transient CHB related to Acute Combined HF  . Chronic combined systolic and diastolic CHF (congestive heart failure) 09/2014     Scheduled Meds:  Scheduled Meds: . aspirin EC  81 mg Oral Daily  . atorvastatin  40 mg Oral Daily  . chlorhexidine  15 mL Mouth/Throat BID  . furosemide  40 mg Oral BID  . heparin subcutaneous  5,000 Units Subcutaneous 3 times per day  . imipenem-cilastatin  500 mg Intravenous 4 times per day  . lisinopril  5 mg Oral Daily  . sodium chloride  3 mL Intravenous Q12H  . spironolactone  12.5 mg Oral Daily   Continuous Infusions: . lactated ringers 10 mL/hr at 11/25/14 2159  . sodium chloride irrigation     sodium chloride, acetaminophen, morphine injection, ondansetron (ZOFRAN) IV, oxyCODONE-acetaminophen, sodium chloride    PHYSICAL EXAM Filed Vitals:   11/29/14 1045 11/29/14 1449 11/29/14 1927 11/30/14 0530  BP:  98/51 104/49 100/51  Pulse:  45 44 40  Temp:  97.5 F (36.4 C) 97.7 F (36.5 C) 97.4 F (36.3 C)  TempSrc:  Axillary Axillary Axillary  Resp: Height:      Weight:    209 lb 14.1 oz (95.2 kg)  SpO2:  95% 96% 95%    Well developed and nourished in no acute distress HENT normal Neck supple with JVP-flat  Carotids parvus et tardus Clear Regular rate and rhythm, 3/6 s murmur Abd-soft with active BS No Clubbing cyanosis edema Skin-warm and dry A & Oriented  Grossly normal sensory and motor function   TELEMETRY: Reviewed telemetry  pt in CHB     Intake/Output Summary (Last 24 hours) at 11/30/14 0807 Last data filed at 11/30/14 0105  Gross per 24 hour  Intake    360 ml  Output   2250 ml  Net  -1890 ml    LABS: Basic Metabolic Panel:  Recent Labs Lab 11/24/14 0620 11/27/14 0230 11/29/14 0310  NA 135 138 138  K 4.0 4.0 4.0  CL 102 100* 97*  CO2 26 28 29   GLUCOSE 112* 108* 123*  BUN 24* 17 24*  CREATININE 1.06 0.97 1.04  CALCIUM 9.2 9.4 9.7  MG  --  2.0  --    Cardiac Enzymes: No results for input(s): CKTOTAL, CKMB, CKMBINDEX, TROPONINI in the last 72 hours. CBC:  Recent Labs Lab 11/24/14 0620 11/27/14 0230 11/28/14 0406  11/29/14 0310 11/30/14 0316  WBC 11.9* 15.2* 11.7* 13.1* 10.3  HGB 14.0 13.3 13.0 12.3* 12.9*  HCT 41.3 39.8 38.3* 36.1* 38.4*  MCV 91.6 91.9 90.5 90.7 92.3  PLT 154 160 159 178 178   PROTIME: No results for input(s): LABPROT, INR in the last 72 hours. Liver Function Tests: No results for input(s): AST, ALT, ALKPHOS, BILITOT, PROT, ALBUMIN in the last 72 hours. No results for input(s): LIPASE, AMYLASE in the last 72 hours. BNP: BNP (last 3 results)  Recent Labs  10/04/14 1120 11/20/14 2217  BNP 1037.2* 339.0*    ProBNP (last 3 results) No results for input(s): PROBNP in the last 8760 hours.  ASSESSMENT AND PLAN:  Principal Problem:   Syncope Active Problems:   Moderate to severe AS with bicuspid valve. Mean 33, peak 61 mmHg.   Bicuspid aortic valve   Tobacco use disorder   Nonischemic cardiomyopathy-EF 50-55% echo 11/20/14   Elevated troponin   Paradentosis   Acute on chronic combined systolic and diastolic CHF    Complete heart block   E. Coli UTI  Will anticipate pacemaker implant at time of AVR Would place LV lead ALSO  Given history of LV dysfunction on arrival   Signed, Sherryl Manges MD  11/30/2014

## 2014-11-30 NOTE — Progress Notes (Signed)
5 Days Post-Op Procedure(s) (LRB): Extraction of tooth #'s 1,2,3,4,5,6,7,9, 10, 11, 12,13,14,16, 19, 20, 21, 22, 23, 24, 25, 26, 27, 28, 29, 30, 31 with alveoloplasty (N/A) Subjective: No chest pain or symptoms of CHF Mouth and gums considerably less swollen Ready for aortic valve replacement with mechanical valve and placement of epicardial permanent pacemaker system in a.m. Temporary pacemaker site dressing changed, painted with Betadine  Objective: Vital signs in last 24 hours: Temp:  [97.4 F (36.3 C)-98.6 F (37 C)] 98.6 F (37 C) (07/04 1350) Pulse Rate:  [40-44] 41 (07/04 1350) Cardiac Rhythm:  [-] Heart block (07/04 0802) Resp:  [16-17] 17 (07/04 1350) BP: (100-107)/(48-51) 107/48 mmHg (07/04 1350) SpO2:  [95 %-98 %] 98 % (07/04 1350) Weight:  [209 lb 14.1 oz (95.2 kg)] 209 lb 14.1 oz (95.2 kg) (07/04 0530)  Hemodynamic parameters for last 24 hours:   stable  Intake/Output from previous day: 07/03 0701 - 07/04 0700 In: 600 [P.O.:600] Out: 2250 [Urine:2250] Intake/Output this shift: Total I/O In: 480 [P.O.:480] Out: 1300 [Urine:1300]  Stable systolic ejection murmur Sinus rhythm Lungs clear  Lab Results:  Recent Labs  11/29/14 0310 11/30/14 0316  WBC 13.1* 10.3  HGB 12.3* 12.9*  HCT 36.1* 38.4*  PLT 178 178   BMET:  Recent Labs  11/29/14 0310  NA 138  K 4.0  CL 97*  CO2 29  GLUCOSE 123*  BUN 24*  CREATININE 1.04  CALCIUM 9.7    PT/INR: No results for input(s): LABPROT, INR in the last 72 hours. ABG    Component Value Date/Time   PHART 7.459* 10/04/2014 0812   HCO3 19.9* 10/04/2014 0812   TCO2 21 10/04/2014 0812   ACIDBASEDEF 3.0* 10/04/2014 0812   O2SAT 79.0 10/04/2014 0812   CBG (last 3)  No results for input(s): GLUCAP in the last 72 hours.  Assessment/Plan: S/P Procedure(s) (LRB): Extraction of tooth #'s 1,2,3,4,5,6,7,9, 10, 11, 12,13,14,16, 19, 20, 21, 22, 23, 24, 25, 26, 27, 28, 29, 30, 31 with alveoloplasty (N/A) Plan aVR,  placement of permanent epicardial pacing system and generator placement in a.m. Patient has been on IV Primaxin for Escherichia coli UTI sensitive to Primaxin  LOS: 9 days    [name] III 11/30/2014

## 2014-11-30 NOTE — Care Management Note (Signed)
Case Management Note  Patient Details  Name: Nicholas Horton MRN: [id number] Date of Birth: Mar 11, 1976  Subjective/Objective:        Admitted with Syncope            Action/Plan:   Expected Discharge Date:  11/24/14               Expected Discharge Plan:  Home/Self Care  In-House Referral:  Clinical Social Work  Discharge planning Services  CM Consult  Post Acute Care Choice:    Choice offered to:     DME Arranged:    DME Agency:     HH Arranged:    HH Agency:     Status of Service:  In process, will continue to follow  Medicare Important Message Given:    Date Medicare IM Given:    Medicare IM give by:    Date Additional Medicare IM Given:    Additional Medicare Important Message give by:     If discussed at Long Length of Stay Meetings, dates discussed:    Additional Comments:  Governor Specking, RN 11/30/2014, 9:48 AM

## 2014-12-01 ENCOUNTER — Encounter (HOSPITAL_COMMUNITY)
Admission: AD | Disposition: A | Discharge status: Home / Self Care | Payer: Self-pay | Source: Other Acute Inpatient Hospital | Attending: Cardiology

## 2014-12-01 ENCOUNTER — Inpatient Hospital Stay (HOSPITAL_COMMUNITY): Payer: Self-pay

## 2014-12-01 ENCOUNTER — Inpatient Hospital Stay (HOSPITAL_COMMUNITY): Payer: Self-pay | Admitting: Anesthesiology

## 2014-12-01 DIAGNOSIS — Z952 Presence of prosthetic heart valve: Secondary | ICD-10-CM

## 2014-12-01 HISTORY — PX: TEE WITHOUT CARDIOVERSION: SHX5443

## 2014-12-01 HISTORY — PX: AORTIC VALVE REPLACEMENT: SHX41

## 2014-12-01 LAB — POCT I-STAT 3, ART BLOOD GAS (G3+)
ACID-BASE EXCESS: 2 mmol/L (ref 0.0–2.0)
ACID-BASE EXCESS: 1 mmol/L (ref 0.0–2.0)
ACID-BASE EXCESS: 2 mmol/L (ref 0.0–2.0)
Acid-Base Excess: 5 mmol/L — ABNORMAL HIGH (ref 0.0–2.0)
Acid-Base Excess: 1 mmol/L (ref 0.0–2.0)
Acid-base deficit: 2 mmol/L (ref 0.0–2.0)
BICARBONATE: 29.9 meq/L — AB (ref 20.0–24.0)
BICARBONATE: 27.3 meq/L — AB (ref 20.0–24.0)
BICARBONATE: 26.2 meq/L — AB (ref 20.0–24.0)
Bicarbonate: 28.1 mEq/L — ABNORMAL HIGH (ref 20.0–24.0)
Bicarbonate: 24.3 mEq/L — ABNORMAL HIGH (ref 20.0–24.0)
Bicarbonate: 26.5 mEq/L — ABNORMAL HIGH (ref 20.0–24.0)
Bicarbonate: 24.7 mEq/L — ABNORMAL HIGH (ref 20.0–24.0)
Bicarbonate: 24.8 mEq/L — ABNORMAL HIGH (ref 20.0–24.0)
O2 SAT: 100 %
O2 SAT: 92 %
O2 SAT: 98 %
O2 Saturation: 100 %
O2 Saturation: 98 %
O2 Saturation: 98 %
O2 Saturation: 95 %
O2 Saturation: 95 %
PCO2 ART: 45.7 mmHg — AB (ref 35.0–45.0)
PCO2 ART: 38.5 mmHg (ref 35.0–45.0)
PH ART: 7.452 — AB (ref 7.350–7.450)
PH ART: 7.414 (ref 7.350–7.450)
PH ART: 7.417 (ref 7.350–7.450)
PO2 ART: 326 mmHg — AB (ref 80.0–100.0)
PO2 ART: 65 mmHg — AB (ref 80.0–100.0)
PO2 ART: 113 mmHg — AB (ref 80.0–100.0)
PO2 ART: 76 mmHg — AB (ref 80.0–100.0)
PO2 ART: 81 mmHg (ref 80.0–100.0)
Patient temperature: 36.7
Patient temperature: 37
Patient temperature: 37.3
TCO2: 30 mmol/L (ref 0–100)
TCO2: 31 mmol/L (ref 0–100)
TCO2: 26 mmol/L (ref 0–100)
TCO2: 28 mmol/L (ref 0–100)
TCO2: 29 mmol/L (ref 0–100)
TCO2: 27 mmol/L (ref 0–100)
TCO2: 26 mmol/L (ref 0–100)
TCO2: 26 mmol/L (ref 0–100)
pCO2 arterial: 47.1 mmHg — ABNORMAL HIGH (ref 35.0–45.0)
pCO2 arterial: 42.8 mmHg (ref 35.0–45.0)
pCO2 arterial: 45.8 mmHg — ABNORMAL HIGH (ref 35.0–45.0)
pCO2 arterial: 44.2 mmHg (ref 35.0–45.0)
pCO2 arterial: 40.9 mmHg (ref 35.0–45.0)
pCO2 arterial: 41.1 mmHg (ref 35.0–45.0)
pH, Arterial: 7.384 (ref 7.350–7.450)
pH, Arterial: 7.333 — ABNORMAL LOW (ref 7.350–7.450)
pH, Arterial: 7.369 (ref 7.350–7.450)
pH, Arterial: 7.398 (ref 7.350–7.450)
pH, Arterial: 7.39 (ref 7.350–7.450)
pO2, Arterial: 432 mmHg — ABNORMAL HIGH (ref 80.0–100.0)
pO2, Arterial: 115 mmHg — ABNORMAL HIGH (ref 80.0–100.0)
pO2, Arterial: 98 mmHg (ref 80.0–100.0)

## 2014-12-01 LAB — BASIC METABOLIC PANEL
Anion gap: 11 (ref 5–15)
BUN: 23 mg/dL — ABNORMAL HIGH (ref 6–20)
CO2: 27 mmol/L (ref 22–32)
Calcium: 9.2 mg/dL (ref 8.9–10.3)
Chloride: 99 mmol/L — ABNORMAL LOW (ref 101–111)
Creatinine, Ser: 1.14 mg/dL (ref 0.61–1.24)
GFR calc Af Amer: >60 mL/min (ref >60)
GFR calc non Af Amer: >60 mL/min (ref >60)
Glucose, Bld: 101 mg/dL — ABNORMAL HIGH (ref 65–99)
Potassium: 3.9 mmol/L (ref 3.5–5.1)
Sodium: 137 mmol/L (ref 135–145)

## 2014-12-01 LAB — GLUCOSE, CAPILLARY
GLUCOSE-CAPILLARY: 120 mg/dL — AB (ref 65–99)
GLUCOSE-CAPILLARY: 127 mg/dL — AB (ref 65–99)
GLUCOSE-CAPILLARY: 149 mg/dL — AB (ref 65–99)
GLUCOSE-CAPILLARY: 146 mg/dL — AB (ref 65–99)
Glucose-Capillary: 133 mg/dL — ABNORMAL HIGH (ref 65–99)
Glucose-Capillary: 140 mg/dL — ABNORMAL HIGH (ref 65–99)
Glucose-Capillary: 131 mg/dL — ABNORMAL HIGH (ref 65–99)

## 2014-12-01 LAB — POCT I-STAT, CHEM 8
BUN: 24 mg/dL — ABNORMAL HIGH (ref 6–20)
BUN: 20 mg/dL (ref 6–20)
BUN: 24 mg/dL — AB (ref 6–20)
BUN: 22 mg/dL — AB (ref 6–20)
BUN: 22 mg/dL — AB (ref 6–20)
BUN: 21 mg/dL — ABNORMAL HIGH (ref 6–20)
BUN: 18 mg/dL (ref 6–20)
CALCIUM ION: 1.24 mmol/L — AB (ref 1.12–1.23)
CALCIUM ION: 0.99 mmol/L — AB (ref 1.12–1.23)
CALCIUM ION: 1.07 mmol/L — AB (ref 1.12–1.23)
CHLORIDE: 102 mmol/L (ref 101–111)
CHLORIDE: 98 mmol/L — AB (ref 101–111)
CHLORIDE: 96 mmol/L — AB (ref 101–111)
CREATININE: 1.2 mg/dL (ref 0.61–1.24)
Calcium, Ion: 0.99 mmol/L — ABNORMAL LOW (ref 1.12–1.23)
Calcium, Ion: 1.25 mmol/L — ABNORMAL HIGH (ref 1.12–1.23)
Calcium, Ion: 1.04 mmol/L — ABNORMAL LOW (ref 1.12–1.23)
Calcium, Ion: 1.14 mmol/L (ref 1.12–1.23)
Chloride: 100 mmol/L — ABNORMAL LOW (ref 101–111)
Chloride: 101 mmol/L (ref 101–111)
Chloride: 100 mmol/L — ABNORMAL LOW (ref 101–111)
Chloride: 101 mmol/L (ref 101–111)
Creatinine, Ser: 1 mg/dL (ref 0.61–1.24)
Creatinine, Ser: 1.2 mg/dL (ref 0.61–1.24)
Creatinine, Ser: 1 mg/dL (ref 0.61–1.24)
Creatinine, Ser: 1 mg/dL (ref 0.61–1.24)
Creatinine, Ser: 1 mg/dL (ref 0.61–1.24)
Creatinine, Ser: 0.9 mg/dL (ref 0.61–1.24)
Glucose, Bld: 101 mg/dL — ABNORMAL HIGH (ref 65–99)
Glucose, Bld: 108 mg/dL — ABNORMAL HIGH (ref 65–99)
Glucose, Bld: 92 mg/dL (ref 65–99)
Glucose, Bld: 197 mg/dL — ABNORMAL HIGH (ref 65–99)
Glucose, Bld: 208 mg/dL — ABNORMAL HIGH (ref 65–99)
Glucose, Bld: 241 mg/dL — ABNORMAL HIGH (ref 65–99)
Glucose, Bld: 148 mg/dL — ABNORMAL HIGH (ref 65–99)
HCT: 34 % — ABNORMAL LOW (ref 39.0–52.0)
HCT: 34 % — ABNORMAL LOW (ref 39.0–52.0)
HCT: 25 % — ABNORMAL LOW (ref 39.0–52.0)
HCT: 26 % — ABNORMAL LOW (ref 39.0–52.0)
HCT: 23 % — ABNORMAL LOW (ref 39.0–52.0)
HCT: 27 % — ABNORMAL LOW (ref 39.0–52.0)
HEMATOCRIT: 25 % — AB (ref 39.0–52.0)
HEMOGLOBIN: 11.6 g/dL — AB (ref 13.0–17.0)
HEMOGLOBIN: 8.5 g/dL — AB (ref 13.0–17.0)
Hemoglobin: 11.6 g/dL — ABNORMAL LOW (ref 13.0–17.0)
Hemoglobin: 8.5 g/dL — ABNORMAL LOW (ref 13.0–17.0)
Hemoglobin: 8.8 g/dL — ABNORMAL LOW (ref 13.0–17.0)
Hemoglobin: 7.8 g/dL — ABNORMAL LOW (ref 13.0–17.0)
Hemoglobin: 9.2 g/dL — ABNORMAL LOW (ref 13.0–17.0)
POTASSIUM: 3.7 mmol/L (ref 3.5–5.1)
POTASSIUM: 3.7 mmol/L (ref 3.5–5.1)
POTASSIUM: 3.6 mmol/L (ref 3.5–5.1)
Potassium: 3.9 mmol/L (ref 3.5–5.1)
Potassium: 4.2 mmol/L (ref 3.5–5.1)
Potassium: 4.2 mmol/L (ref 3.5–5.1)
Potassium: 4.6 mmol/L (ref 3.5–5.1)
SODIUM: 136 mmol/L (ref 135–145)
SODIUM: 134 mmol/L — AB (ref 135–145)
SODIUM: 135 mmol/L (ref 135–145)
Sodium: 132 mmol/L — ABNORMAL LOW (ref 135–145)
Sodium: 136 mmol/L (ref 135–145)
Sodium: 136 mmol/L (ref 135–145)
Sodium: 136 mmol/L (ref 135–145)
TCO2: 28 mmol/L (ref 0–100)
TCO2: 28 mmol/L (ref 0–100)
TCO2: 29 mmol/L (ref 0–100)
TCO2: 26 mmol/L (ref 0–100)
TCO2: 25 mmol/L (ref 0–100)
TCO2: 21 mmol/L (ref 0–100)
TCO2: 22 mmol/L (ref 0–100)

## 2014-12-01 LAB — POCT I-STAT 4, (NA,K, GLUC, HGB,HCT)
Glucose, Bld: 163 mg/dL — ABNORMAL HIGH (ref 65–99)
HEMATOCRIT: 29 % — AB (ref 39.0–52.0)
Hemoglobin: 9.9 g/dL — ABNORMAL LOW (ref 13.0–17.0)
POTASSIUM: 4 mmol/L (ref 3.5–5.1)
Sodium: 138 mmol/L (ref 135–145)

## 2014-12-01 LAB — CBC
HCT: 37.1 % — ABNORMAL LOW (ref 39.0–52.0)
HCT: 27.2 % — ABNORMAL LOW (ref 39.0–52.0)
HCT: 27.8 % — ABNORMAL LOW (ref 39.0–52.0)
HEMOGLOBIN: 9.4 g/dL — AB (ref 13.0–17.0)
Hemoglobin: 12.6 g/dL — ABNORMAL LOW (ref 13.0–17.0)
Hemoglobin: 9.5 g/dL — ABNORMAL LOW (ref 13.0–17.0)
MCH: 31 pg (ref 26.0–34.0)
MCH: 31.4 pg (ref 26.0–34.0)
MCH: 30.4 pg (ref 26.0–34.0)
MCHC: 34 g/dL (ref 30.0–36.0)
MCHC: 34.6 g/dL (ref 30.0–36.0)
MCHC: 34.2 g/dL (ref 30.0–36.0)
MCV: 91.4 fL (ref 78.0–100.0)
MCV: 91 fL (ref 78.0–100.0)
MCV: 89.1 fL (ref 78.0–100.0)
Platelets: 186 10*3/uL (ref 150–400)
Platelets: 154 10*3/uL (ref 150–400)
Platelets: 188 10*3/uL (ref 150–400)
RBC: 4.06 MIL/uL — ABNORMAL LOW (ref 4.22–5.81)
RBC: 2.99 MIL/uL — ABNORMAL LOW (ref 4.22–5.81)
RBC: 3.12 MIL/uL — ABNORMAL LOW (ref 4.22–5.81)
RDW: 12.9 % (ref 11.5–15.5)
RDW: 12.9 % (ref 11.5–15.5)
RDW: 13.1 % (ref 11.5–15.5)
WBC: 13.8 10*3/uL — ABNORMAL HIGH (ref 4.0–10.5)
WBC: 22.6 10*3/uL — AB (ref 4.0–10.5)
WBC: 16.8 10*3/uL — ABNORMAL HIGH (ref 4.0–10.5)

## 2014-12-01 LAB — PREPARE RBC (CROSSMATCH)

## 2014-12-01 LAB — HEMOGLOBIN AND HEMATOCRIT, BLOOD
HCT: 27.1 % — ABNORMAL LOW (ref 39.0–52.0)
Hemoglobin: 9.3 g/dL — ABNORMAL LOW (ref 13.0–17.0)

## 2014-12-01 LAB — CREATININE, SERUM
Creatinine, Ser: 0.87 mg/dL (ref 0.61–1.24)
GFR calc Af Amer: >60 mL/min (ref >60)
GFR calc non Af Amer: >60 mL/min (ref >60)

## 2014-12-01 LAB — PLATELET COUNT: Platelets: 143 10*3/uL — ABNORMAL LOW (ref 150–400)

## 2014-12-01 LAB — PROTIME-INR
INR: 1.64 — AB (ref 0.00–1.49)
PROTHROMBIN TIME: 19.4 s — AB (ref 11.6–15.2)

## 2014-12-01 LAB — APTT: aPTT: 33 seconds (ref 24–37)

## 2014-12-01 LAB — MAGNESIUM: Magnesium: 2.5 mg/dL — ABNORMAL HIGH (ref 1.7–2.4)

## 2014-12-01 SURGERY — REPLACEMENT, AORTIC VALVE, OPEN
Anesthesia: General | Site: Chest

## 2014-12-01 MED ORDER — MORPHINE SULFATE 2 MG/ML IJ SOLN
2.0000 mg | INTRAMUSCULAR | Status: AC | PRN
  Administered 2014-12-01: 2 mg via INTRAVENOUS
  Administered 2014-12-01: 4 mg via INTRAVENOUS
  Administered 2014-12-01: 2 mg via INTRAVENOUS
  Administered 2014-12-02: 4 mg via INTRAVENOUS
  Administered 2014-12-02 – 2014-12-04 (×5): 2 mg via INTRAVENOUS
  Filled 2014-12-01 (×3): qty 1
  Filled 2014-12-01 (×2): qty 2
  Filled 2014-12-01 (×4): qty 1

## 2014-12-01 MED ORDER — ARTIFICIAL TEARS OP OINT
TOPICAL_OINTMENT | OPHTHALMIC | Status: DC | PRN
  Administered 2014-12-01: 1 via OPHTHALMIC

## 2014-12-01 MED ORDER — ASPIRIN EC 325 MG PO TBEC
325.0000 mg | DELAYED_RELEASE_TABLET | Freq: Every day | ORAL | Status: DC
  Administered 2014-12-03: 325 mg via ORAL
  Filled 2014-12-01 (×3): qty 1

## 2014-12-01 MED ORDER — ALBUMIN HUMAN 5 % IV SOLN
INTRAVENOUS | Status: DC | PRN
  Administered 2014-12-01 (×2): via INTRAVENOUS

## 2014-12-01 MED ORDER — FENTANYL CITRATE (PF) 250 MCG/5ML IJ SOLN
INTRAMUSCULAR | Status: AC
  Filled 2014-12-01: qty 5

## 2014-12-01 MED ORDER — MILRINONE IN DEXTROSE 20 MG/100ML IV SOLN
0.3000 ug/kg/min | INTRAVENOUS | Status: DC
  Administered 2014-12-01 – 2014-12-02 (×2): 0.3 ug/kg/min via INTRAVENOUS
  Filled 2014-12-01 (×2): qty 100

## 2014-12-01 MED ORDER — BISACODYL 5 MG PO TBEC
10.0000 mg | DELAYED_RELEASE_TABLET | Freq: Every day | ORAL | Status: DC
  Administered 2014-12-02 – 2014-12-08 (×4): 10 mg via ORAL
  Filled 2014-12-01 (×3): qty 2

## 2014-12-01 MED ORDER — LACTATED RINGERS IV SOLN
500.0000 mL | Freq: Once | INTRAVENOUS | Status: DC | PRN
Start: 2014-12-01 — End: 2014-12-01

## 2014-12-01 MED ORDER — LACTATED RINGERS IV SOLN: INTRAVENOUS | Status: DC

## 2014-12-01 MED ORDER — CHLORHEXIDINE GLUCONATE 0.12 % MT SOLN
15.0000 mL | Freq: Two times a day (BID) | OROMUCOSAL | Status: DC
  Administered 2014-12-01: 15 mL via OROMUCOSAL
  Filled 2014-12-01: qty 15

## 2014-12-01 MED ORDER — MILRINONE IN DEXTROSE 20 MG/100ML IV SOLN
INTRAVENOUS | Status: DC | PRN
  Administered 2014-12-01: .3 ug/kg/min via INTRAVENOUS

## 2014-12-01 MED ORDER — VANCOMYCIN HCL IN DEXTROSE 1-5 GM/200ML-% IV SOLN
1000.0000 mg | Freq: Two times a day (BID) | INTRAVENOUS | Status: AC
  Administered 2014-12-01 – 2014-12-02 (×3): 1000 mg via INTRAVENOUS
  Filled 2014-12-01 (×3): qty 200

## 2014-12-01 MED ORDER — DOCUSATE SODIUM 100 MG PO CAPS
200.0000 mg | ORAL_CAPSULE | Freq: Every day | ORAL | Status: DC
  Administered 2014-12-02 – 2014-12-08 (×6): 200 mg via ORAL
  Filled 2014-12-01 (×7): qty 2

## 2014-12-01 MED ORDER — SODIUM CHLORIDE 0.9 % IJ SOLN
OROMUCOSAL | Status: DC | PRN
  Administered 2014-12-01 (×3): 4 mL via TOPICAL

## 2014-12-01 MED ORDER — ASPIRIN 81 MG PO CHEW
324.0000 mg | CHEWABLE_TABLET | Freq: Every day | ORAL | Status: DC
  Administered 2014-12-02: 324 mg
  Filled 2014-12-01: qty 4

## 2014-12-01 MED ORDER — DOPAMINE-DEXTROSE 3.2-5 MG/ML-% IV SOLN: 0.0000 ug/kg/min | INTRAVENOUS | Status: DC

## 2014-12-01 MED ORDER — ACETAMINOPHEN 650 MG RE SUPP
650.0000 mg | Freq: Once | RECTAL | Status: AC
  Administered 2014-12-01: 650 mg via RECTAL

## 2014-12-01 MED ORDER — VANCOMYCIN HCL 1000 MG IV SOLR
INTRAVENOUS | Status: DC | PRN
  Administered 2014-12-01: 1000 mg

## 2014-12-01 MED ORDER — POTASSIUM CHLORIDE 10 MEQ/50ML IV SOLN
10.0000 meq | INTRAVENOUS | Status: AC
Start: 2014-12-01 — End: 2014-12-01
  Administered 2014-12-01 (×2): 10 meq via INTRAVENOUS

## 2014-12-01 MED ORDER — ACETAMINOPHEN 160 MG/5ML PO SOLN
1000.0000 mg | Freq: Four times a day (QID) | ORAL | Status: AC
  Filled 2014-12-01: qty 40

## 2014-12-01 MED ORDER — STERILE WATER FOR INJECTION IJ SOLN
INTRAMUSCULAR | Status: AC
  Filled 2014-12-01: qty 10

## 2014-12-01 MED ORDER — MORPHINE SULFATE 2 MG/ML IJ SOLN: 1.0000 mg | INTRAMUSCULAR | Status: AC | PRN

## 2014-12-01 MED ORDER — ALBUMIN HUMAN 5 % IV SOLN: 250.0000 mL | INTRAVENOUS | Status: DC | PRN

## 2014-12-01 MED ORDER — MIDAZOLAM HCL 2 MG/2ML IJ SOLN: 2.0000 mg | INTRAMUSCULAR | Status: DC | PRN

## 2014-12-01 MED ORDER — PANTOPRAZOLE SODIUM 40 MG PO TBEC
40.0000 mg | DELAYED_RELEASE_TABLET | Freq: Every day | ORAL | Status: DC
  Administered 2014-12-02 – 2014-12-08 (×7): 40 mg via ORAL
  Filled 2014-12-01 (×7): qty 1

## 2014-12-01 MED ORDER — SODIUM CHLORIDE 0.9 % IV SOLN: Freq: Once | INTRAVENOUS | Status: DC

## 2014-12-01 MED ORDER — FAMOTIDINE IN NACL 20-0.9 MG/50ML-% IV SOLN
20.0000 mg | Freq: Two times a day (BID) | INTRAVENOUS | Status: AC
  Administered 2014-12-01: 20 mg via INTRAVENOUS

## 2014-12-01 MED ORDER — HEMOSTATIC AGENTS (NO CHARGE) OPTIME
TOPICAL | Status: DC | PRN
  Administered 2014-12-01 (×2): 1 via TOPICAL

## 2014-12-01 MED ORDER — TRAMADOL HCL 50 MG PO TABS
50.0000 mg | ORAL_TABLET | ORAL | Status: DC | PRN
  Administered 2014-12-05: 50 mg via ORAL
  Filled 2014-12-01: qty 1

## 2014-12-01 MED ORDER — INSULIN REGULAR HUMAN 100 UNIT/ML IJ SOLN
INTRAMUSCULAR | Status: DC
  Administered 2014-12-01: 4.5 [IU]/h via INTRAVENOUS
  Administered 2014-12-01: 6.4 [IU]/h via INTRAVENOUS
  Filled 2014-12-01: qty 2.5

## 2014-12-01 MED ORDER — METOPROLOL TARTRATE 1 MG/ML IV SOLN
2.5000 mg | INTRAVENOUS | Status: DC | PRN
Start: 2014-12-01 — End: 2014-12-08

## 2014-12-01 MED ORDER — MIDAZOLAM HCL 5 MG/5ML IJ SOLN
INTRAMUSCULAR | Status: DC | PRN
  Administered 2014-12-01: 1 mg via INTRAVENOUS
  Administered 2014-12-01: 3 mg via INTRAVENOUS
  Administered 2014-12-01: 1 mg via INTRAVENOUS
  Administered 2014-12-01: 2 mg via INTRAVENOUS
  Administered 2014-12-01: 3 mg via INTRAVENOUS

## 2014-12-01 MED ORDER — INSULIN REGULAR BOLUS VIA INFUSION
0.0000 [IU] | Freq: Three times a day (TID) | INTRAVENOUS | Status: DC
  Filled 2014-12-01: qty 10

## 2014-12-01 MED ORDER — DEXTROSE 5 % IV SOLN: 0.0000 ug/min | INTRAVENOUS | Status: DC

## 2014-12-01 MED ORDER — NITROGLYCERIN IN D5W 200-5 MCG/ML-% IV SOLN
0.0000 ug/min | INTRAVENOUS | Status: DC
Start: 2014-12-01 — End: 2014-12-02

## 2014-12-01 MED ORDER — PROTAMINE SULFATE 10 MG/ML IV SOLN
INTRAVENOUS | Status: DC | PRN
  Administered 2014-12-01: 330 mg via INTRAVENOUS
  Administered 2014-12-01: 20 mg via INTRAVENOUS

## 2014-12-01 MED ORDER — NOREPINEPHRINE BITARTRATE 1 MG/ML IV SOLN
0.0000 ug/min | INTRAVENOUS | Status: DC
  Administered 2014-12-01: 14 ug/min via INTRAVENOUS
  Administered 2014-12-02: 11 ug/min via INTRAVENOUS
  Filled 2014-12-01 (×2): qty 4

## 2014-12-01 MED ORDER — CETYLPYRIDINIUM CHLORIDE 0.05 % MT LIQD
7.0000 mL | Freq: Two times a day (BID) | OROMUCOSAL | Status: DC
  Administered 2014-12-02 – 2014-12-07 (×7): 7 mL via OROMUCOSAL

## 2014-12-01 MED ORDER — LACTATED RINGERS IV SOLN
INTRAVENOUS | Status: DC | PRN
  Administered 2014-12-01 (×2): via INTRAVENOUS

## 2014-12-01 MED ORDER — SODIUM CHLORIDE 0.9 % IJ SOLN
OROMUCOSAL | Status: DC | PRN
  Administered 2014-12-01: 4 mL via TOPICAL

## 2014-12-01 MED ORDER — ALBUMIN HUMAN 5 % IV SOLN: 250.0000 mL | INTRAVENOUS | Status: AC | PRN

## 2014-12-01 MED ORDER — METOPROLOL TARTRATE 25 MG/10 ML ORAL SUSPENSION
12.5000 mg | Freq: Two times a day (BID) | ORAL | Status: DC
  Filled 2014-12-01 (×15): qty 5

## 2014-12-01 MED ORDER — PROPOFOL 10 MG/ML IV BOLUS
INTRAVENOUS | Status: DC | PRN
  Administered 2014-12-01: 50 mg via INTRAVENOUS

## 2014-12-01 MED ORDER — VECURONIUM BROMIDE 10 MG IV SOLR
INTRAVENOUS | Status: AC
  Filled 2014-12-01: qty 10

## 2014-12-01 MED ORDER — CALCIUM CHLORIDE 10 % IV SOLN
INTRAVENOUS | Status: DC | PRN
  Administered 2014-12-01: 1 g via INTRAVENOUS

## 2014-12-01 MED ORDER — LACTATED RINGERS IV SOLN
INTRAVENOUS | Status: DC | PRN
  Administered 2014-12-01 (×3): via INTRAVENOUS

## 2014-12-01 MED ORDER — ROCURONIUM BROMIDE 100 MG/10ML IV SOLN
INTRAVENOUS | Status: DC | PRN
  Administered 2014-12-01 (×2): 50 mg via INTRAVENOUS

## 2014-12-01 MED ORDER — DEXMEDETOMIDINE HCL IN NACL 200 MCG/50ML IV SOLN
0.0000 ug/kg/h | INTRAVENOUS | Status: DC
  Filled 2014-12-01: qty 50

## 2014-12-01 MED ORDER — SODIUM CHLORIDE 0.9 % IV SOLN
INTRAVENOUS | Status: DC
  Administered 2014-12-01: 16:00:00 via INTRAVENOUS

## 2014-12-01 MED ORDER — HEPARIN SODIUM (PORCINE) 1000 UNIT/ML IJ SOLN
INTRAMUSCULAR | Status: DC | PRN
  Administered 2014-12-01: 37000 [IU] via INTRAVENOUS

## 2014-12-01 MED ORDER — ACETAMINOPHEN 160 MG/5ML PO SOLN: 650.0000 mg | Freq: Once | ORAL | Status: AC

## 2014-12-01 MED ORDER — DEXTROSE 5 % IV SOLN
1.5000 g | Freq: Two times a day (BID) | INTRAVENOUS | Status: AC
  Administered 2014-12-01 – 2014-12-03 (×4): 1.5 g via INTRAVENOUS
  Filled 2014-12-01 (×4): qty 1.5

## 2014-12-01 MED ORDER — HEMOSTATIC AGENTS (NO CHARGE) OPTIME
TOPICAL | Status: DC | PRN
  Administered 2014-12-01: 1 via TOPICAL

## 2014-12-01 MED ORDER — SODIUM CHLORIDE 0.9 % IV SOLN: 250.0000 mL | INTRAVENOUS | Status: DC

## 2014-12-01 MED ORDER — FENTANYL CITRATE (PF) 100 MCG/2ML IJ SOLN
INTRAMUSCULAR | Status: DC | PRN
  Administered 2014-12-01: 250 ug via INTRAVENOUS
  Administered 2014-12-01: 100 ug via INTRAVENOUS
  Administered 2014-12-01: 50 ug via INTRAVENOUS
  Administered 2014-12-01: 150 ug via INTRAVENOUS
  Administered 2014-12-01: 500 ug via INTRAVENOUS
  Administered 2014-12-01: 50 ug via INTRAVENOUS

## 2014-12-01 MED ORDER — SODIUM CHLORIDE 0.9 % IJ SOLN
3.0000 mL | INTRAMUSCULAR | Status: DC | PRN
  Administered 2014-12-03: 09:00:00 via INTRAVENOUS
  Filled 2014-12-01: qty 3

## 2014-12-01 MED ORDER — SODIUM CHLORIDE 0.9 % IJ SOLN
3.0000 mL | Freq: Two times a day (BID) | INTRAMUSCULAR | Status: DC
  Administered 2014-12-02 – 2014-12-08 (×10): 3 mL via INTRAVENOUS

## 2014-12-01 MED ORDER — HEPARIN SODIUM (PORCINE) 1000 UNIT/ML IJ SOLN
INTRAMUSCULAR | Status: AC
  Filled 2014-12-01: qty 1

## 2014-12-01 MED ORDER — SODIUM CHLORIDE 0.45 % IV SOLN
INTRAVENOUS | Status: DC | PRN
  Administered 2014-12-01: 16:00:00 via INTRAVENOUS

## 2014-12-01 MED ORDER — AMINOCAPROIC ACID 250 MG/ML IV SOLN
INTRAVENOUS | Status: DC | PRN
  Administered 2014-12-01: 5 g via INTRAVENOUS

## 2014-12-01 MED ORDER — ROCURONIUM BROMIDE 50 MG/5ML IV SOLN
INTRAVENOUS | Status: AC
  Filled 2014-12-01: qty 1

## 2014-12-01 MED ORDER — VANCOMYCIN HCL IN DEXTROSE 1-5 GM/200ML-% IV SOLN
1000.0000 mg | Freq: Once | INTRAVENOUS | Status: DC
  Filled 2014-12-01: qty 200

## 2014-12-01 MED ORDER — OXYCODONE HCL 5 MG PO TABS
5.0000 mg | ORAL_TABLET | ORAL | Status: DC | PRN
  Administered 2014-12-03: 5 mg via ORAL
  Administered 2014-12-03: 10 mg via ORAL
  Administered 2014-12-04 (×2): 5 mg via ORAL
  Administered 2014-12-07: 10 mg via ORAL
  Filled 2014-12-01: qty 1
  Filled 2014-12-01 (×2): qty 2
  Filled 2014-12-01 (×2): qty 1

## 2014-12-01 MED ORDER — VANCOMYCIN HCL 1000 MG IV SOLR
INTRAVENOUS | Status: AC
  Filled 2014-12-01: qty 1000

## 2014-12-01 MED ORDER — DESMOPRESSIN ACETATE 4 MCG/ML IJ SOLN
20.0000 ug | INTRAMUSCULAR | Status: AC
  Administered 2014-12-01: 20 ug via INTRAVENOUS
  Filled 2014-12-01: qty 5

## 2014-12-01 MED ORDER — CETYLPYRIDINIUM CHLORIDE 0.05 % MT LIQD
7.0000 mL | Freq: Four times a day (QID) | OROMUCOSAL | Status: DC
Start: 2014-12-02 — End: 2014-12-01

## 2014-12-01 MED ORDER — MAGNESIUM SULFATE 4 GM/100ML IV SOLN
4.0000 g | Freq: Once | INTRAVENOUS | Status: AC
  Administered 2014-12-01: 4 g via INTRAVENOUS
  Filled 2014-12-01: qty 100

## 2014-12-01 MED ORDER — PROPOFOL 10 MG/ML IV BOLUS
INTRAVENOUS | Status: AC
  Filled 2014-12-01: qty 20

## 2014-12-01 MED ORDER — ACETAMINOPHEN 500 MG PO TABS
1000.0000 mg | ORAL_TABLET | Freq: Four times a day (QID) | ORAL | Status: AC
  Administered 2014-12-02 – 2014-12-06 (×18): 1000 mg via ORAL
  Filled 2014-12-01 (×21): qty 2

## 2014-12-01 MED ORDER — VECURONIUM BROMIDE 10 MG IV SOLR
INTRAVENOUS | Status: DC | PRN
  Administered 2014-12-01 (×4): 5 mg via INTRAVENOUS

## 2014-12-01 MED ORDER — MILRINONE IN DEXTROSE 20 MG/100ML IV SOLN
0.3750 ug/kg/min | INTRAVENOUS | Status: DC
  Filled 2014-12-01: qty 100

## 2014-12-01 MED ORDER — NOREPINEPHRINE BITARTRATE 1 MG/ML IV SOLN
0.0000 ug/min | INTRAVENOUS | Status: AC
  Administered 2014-12-01: 5 ug/min via INTRAVENOUS
  Filled 2014-12-01: qty 4

## 2014-12-01 MED ORDER — MIDAZOLAM HCL 10 MG/2ML IJ SOLN
INTRAMUSCULAR | Status: AC
  Filled 2014-12-01: qty 2

## 2014-12-01 MED ORDER — ONDANSETRON HCL 4 MG/2ML IJ SOLN: 4.0000 mg | Freq: Four times a day (QID) | INTRAMUSCULAR | Status: DC | PRN

## 2014-12-01 MED ORDER — BISACODYL 10 MG RE SUPP: 10.0000 mg | Freq: Every day | RECTAL | Status: DC

## 2014-12-01 MED ORDER — 0.9 % SODIUM CHLORIDE (POUR BTL) OPTIME
TOPICAL | Status: DC | PRN
  Administered 2014-12-01: 6000 mL

## 2014-12-01 MED ORDER — METOPROLOL TARTRATE 12.5 MG HALF TABLET
12.5000 mg | ORAL_TABLET | Freq: Two times a day (BID) | ORAL | Status: DC
  Administered 2014-12-02 – 2014-12-08 (×11): 12.5 mg via ORAL
  Filled 2014-12-01 (×15): qty 1

## 2014-12-01 SURGICAL SUPPLY — 110 items
ADAPTER CARDIO PERF ANTE/RETRO (ADAPTER) ×5 IMPLANT
APPLICATOR COTTON TIP 6IN STRL (MISCELLANEOUS) ×5 IMPLANT
BAG DECANTER FOR FLEXI CONT (MISCELLANEOUS) ×5 IMPLANT
BLADE STERNUM SYSTEM 6 (BLADE) ×5 IMPLANT
BLADE SURG 12 STRL SS (BLADE) ×5 IMPLANT
BLADE SURG 15 STRL LF DISP TIS (BLADE) ×3 IMPLANT
BLADE SURG 15 STRL SS (BLADE) ×2
CABLE PACING FASLOC BIEGE (MISCELLANEOUS) ×5 IMPLANT
CABLE PACING FASLOC BLUE (MISCELLANEOUS) ×5 IMPLANT
CANISTER SUCTION 2500CC (MISCELLANEOUS) ×5 IMPLANT
CANNULA ARTERIAL NVNT 3/8 22FR (MISCELLANEOUS) ×5 IMPLANT
CANNULA GUNDRY RCSP 15FR (MISCELLANEOUS) ×5 IMPLANT
CATH CPB KIT VANTRIGT (MISCELLANEOUS) ×5 IMPLANT
CATH HEART VENT LEFT (CATHETERS) ×3 IMPLANT
CATH RETROPLEGIA CORONARY 14FR (CATHETERS) ×5 IMPLANT
CATH ROBINSON RED A/P 18FR (CATHETERS) ×15 IMPLANT
CATH THORACIC 36FR RT ANG (CATHETERS) ×5 IMPLANT
CATH/SQUID NICHOLS JEHLE COR (CATHETERS) ×5 IMPLANT
CLIP FOGARTY SPRING 6M (CLIP) IMPLANT
CONT SPEC 4OZ CLIKSEAL STRL BL (MISCELLANEOUS) ×5 IMPLANT
COVER SURGICAL LIGHT HANDLE (MISCELLANEOUS) ×10 IMPLANT
CRADLE DONUT ADULT HEAD (MISCELLANEOUS) ×5 IMPLANT
DRAIN CHANNEL 32F RND 10.7 FF (WOUND CARE) ×5 IMPLANT
DRAPE CARDIOVASCULAR INCISE (DRAPES) ×2
DRAPE SLUSH/WARMER DISC (DRAPES) ×5 IMPLANT
DRAPE SRG 135X102X78XABS (DRAPES) ×3 IMPLANT
DRSG AQUACEL AG ADV 3.5X14 (GAUZE/BANDAGES/DRESSINGS) ×5 IMPLANT
DRSG OPSITE POSTOP 4X10 (GAUZE/BANDAGES/DRESSINGS) ×5 IMPLANT
ELECT BLADE 4.0 EZ CLEAN MEGAD (MISCELLANEOUS) ×5
ELECT BLADE 6.5 EXT (BLADE) ×5 IMPLANT
ELECT CAUTERY BLADE 6.4 (BLADE) ×5 IMPLANT
ELECT REM PT RETURN 9FT ADLT (ELECTROSURGICAL) ×10
ELECTRODE BLDE 4.0 EZ CLN MEGD (MISCELLANEOUS) ×3 IMPLANT
ELECTRODE REM PT RTRN 9FT ADLT (ELECTROSURGICAL) ×6 IMPLANT
GAUZE SPONGE 2X2 8PLY STRL LF (GAUZE/BANDAGES/DRESSINGS) ×3 IMPLANT
GAUZE SPONGE 4X4 12PLY STRL (GAUZE/BANDAGES/DRESSINGS) ×5 IMPLANT
GAUZE XEROFORM 1X8 LF (GAUZE/BANDAGES/DRESSINGS) ×5 IMPLANT
GLOVE BIO SURGEON STRL SZ 6 (GLOVE) ×5 IMPLANT
GLOVE BIO SURGEON STRL SZ7.5 (GLOVE) ×15 IMPLANT
GLOVE BIOGEL PI IND STRL 6 (GLOVE) ×9 IMPLANT
GLOVE BIOGEL PI IND STRL 7.0 (GLOVE) ×6 IMPLANT
GLOVE BIOGEL PI INDICATOR 6 (GLOVE) ×6
GLOVE BIOGEL PI INDICATOR 7.0 (GLOVE) ×4
GOWN STRL REUS W/ TWL LRG LVL3 (GOWN DISPOSABLE) ×18 IMPLANT
GOWN STRL REUS W/TWL LRG LVL3 (GOWN DISPOSABLE) ×12
HEMOSTAT POWDER SURGIFOAM 1G (HEMOSTASIS) ×15 IMPLANT
HEMOSTAT SURGICEL 2X14 (HEMOSTASIS) ×5 IMPLANT
INSERT FOGARTY XLG (MISCELLANEOUS) IMPLANT
KIT BASIN OR (CUSTOM PROCEDURE TRAY) ×5 IMPLANT
KIT LEAD END CAP (Cap) ×6 IMPLANT
KIT ROOM TURNOVER OR (KITS) ×5 IMPLANT
KIT SUCTION CATH 14FR (SUCTIONS) ×5 IMPLANT
LEAD BIPOLAR SUT EPI PAC 60CM (Generator) ×5 IMPLANT
LEAD END CAP (Cap) ×4 IMPLANT
LEAD END CAP KIT ×10 IMPLANT
LEAD PACING EPI (Prosthesis & Implant Heart) ×10 IMPLANT
LEAD PACING MYOCARDI (MISCELLANEOUS) ×10 IMPLANT
LINE VENT (MISCELLANEOUS) ×5 IMPLANT
NDL SUT 1 .5 CRC FRENCH EYE (NEEDLE) ×3 IMPLANT
NEEDLE AORTIC AIR ASPIRATING (NEEDLE) ×5 IMPLANT
NEEDLE FRENCH EYE (NEEDLE) ×2
NS IRRIG 1000ML POUR BTL (IV SOLUTION) ×30 IMPLANT
PACK OPEN HEART (CUSTOM PROCEDURE TRAY) ×5 IMPLANT
PAD ARMBOARD 7.5X6 YLW CONV (MISCELLANEOUS) ×10 IMPLANT
PPM CONSULTA CRT-P C4TR01 (Pacemaker) ×5 IMPLANT
SEALANT SURG COSEAL 8ML (VASCULAR PRODUCTS) ×5 IMPLANT
SET CARDIOPLEGIA MPS 5001102 (MISCELLANEOUS) ×5 IMPLANT
SPONGE GAUZE 2X2 STER 10/PKG (GAUZE/BANDAGES/DRESSINGS) ×2
SPONGE LAP 18X18 X RAY DECT (DISPOSABLE) ×5 IMPLANT
SURGIFLO W/THROMBIN 8M KIT (HEMOSTASIS) ×5 IMPLANT
SUT BONE WAX W31G (SUTURE) ×5 IMPLANT
SUT ETHIBON 2 0 V 52N 30 (SUTURE) ×10 IMPLANT
SUT ETHIBON EXCEL 2-0 V-5 (SUTURE) ×5 IMPLANT
SUT ETHIBOND 2 0 SH (SUTURE) ×10
SUT ETHIBOND 2 0 SH 36X2 (SUTURE) ×15 IMPLANT
SUT ETHIBOND V-5 VALVE (SUTURE) ×5 IMPLANT
SUT PROLENE 3 0 RB 1 (SUTURE) ×5 IMPLANT
SUT PROLENE 3 0 SH 1 (SUTURE) IMPLANT
SUT PROLENE 3 0 SH DA (SUTURE) ×10 IMPLANT
SUT PROLENE 3 0 SH1 36 (SUTURE) ×5 IMPLANT
SUT PROLENE 4 0 RB 1 (SUTURE) ×18
SUT PROLENE 4 0 SH DA (SUTURE) ×20 IMPLANT
SUT PROLENE 4-0 RB1 .5 CRCL 36 (SUTURE) ×27 IMPLANT
SUT PROLENE 6 0 C 1 30 (SUTURE) ×90 IMPLANT
SUT PROLENE 6 0 CC (SUTURE) ×10 IMPLANT
SUT SILK 1 TIES 10X30 (SUTURE) ×10 IMPLANT
SUT SILK 2 0 SH CR/8 (SUTURE) ×10 IMPLANT
SUT SILK 2 0 TIES 10X30 (SUTURE) ×5 IMPLANT
SUT SILK 4 0 TIE 10X30 (SUTURE) ×5 IMPLANT
SUT STEEL 6MS V (SUTURE) IMPLANT
SUT STEEL SZ 6 DBL 3X14 BALL (SUTURE) ×10 IMPLANT
SUT VIC AB 1 CTX 36 (SUTURE) ×6
SUT VIC AB 1 CTX36XBRD ANBCTR (SUTURE) ×9 IMPLANT
SUT VIC AB 2-0 CT1 27 (SUTURE) ×2
SUT VIC AB 2-0 CT1 TAPERPNT 27 (SUTURE) ×3 IMPLANT
SUT VIC AB 2-0 CTX 27 (SUTURE) ×10 IMPLANT
SUT VIC AB 2-0 CTX 36 (SUTURE) ×5 IMPLANT
SUT VIC AB 3-0 SH 8-18 (SUTURE) ×5 IMPLANT
SUT VIC AB 3-0 X1 27 (SUTURE) ×15 IMPLANT
SYR 10ML KIT SKIN ADHESIVE (MISCELLANEOUS) IMPLANT
SYSTEM SAHARA CHEST DRAIN ATS (WOUND CARE) ×5 IMPLANT
TAPE CLOTH SURG 4X10 WHT LF (GAUZE/BANDAGES/DRESSINGS) ×5 IMPLANT
TOWEL OR 17X24 6PK STRL BLUE (TOWEL DISPOSABLE) ×10 IMPLANT
TOWEL OR 17X26 10 PK STRL BLUE (TOWEL DISPOSABLE) ×10 IMPLANT
TRAY FOLEY IC TEMP SENS 16FR (CATHETERS) ×5 IMPLANT
UNDERPAD 30X30 INCONTINENT (UNDERPADS AND DIAPERS) ×5 IMPLANT
VALVE AORTIC TOP HAT (Prosthesis & Implant Heart) ×2 IMPLANT
VALVE AORTIC TOP HAT 21 (Prosthesis & Implant Heart) ×3 IMPLANT
VENT LEFT HEART 12002 (CATHETERS) ×5
WATER STERILE IRR 1000ML POUR (IV SOLUTION) ×10 IMPLANT

## 2014-12-01 NOTE — OR Nursing (Signed)
40 minute call to SICU charge nurse at 1325.

## 2014-12-01 NOTE — Brief Op Note (Addendum)
11/21/2014 - 12/01/2014      [address]       [place] [address]             [phone]     11/21/2014 - 12/01/2014  12:38 PM  PATIENT:  Nicholas Horton  39 y.o. male  PRE-OPERATIVE DIAGNOSIS:  SEVERE AS  POST-OPERATIVE DIAGNOSIS:  SEVERE AS  PROCEDURE:  Procedure(s): AORTIC VALVE REPLACEMENT (AVR), IMPLANT OF PERMANENT DUAL CHAMBER PACEMAKER LEADS, Placement of temporary transvenous pacing wire TRANSESOPHAGEAL ECHOCARDIOGRAM (TEE)  SURGEON:  Surgeon(s): Kerin Perna, MD  PHYSICIAN ASSISTANT: WAYNE GOLD PA-C  ANESTHESIA:   general  PATIENT CONDITION:  ICU - intubated and hemodynamically stable.  PRE-OPERATIVE WEIGHT: 95kg  Aortic Valve  Procedure Performed:  Replacement: Yes.  Mechanical Valve. Implant Model Number:[id number], Size:21, Unique Device Identifier:[id number]  Repair/Reconstruction: No.   Aortic Annular Enlargement: No. Aortic Valve Etiology   Aortic Insufficiency:  Mild  Aortic Valve Disease:  Yes.  Aortic Stenosis:  Yes. Smallest Aortic Valve Area: .39cm2; Highest Mean Gradient: .  Etiology (Choose at least one and up to  5 etiologies):  Degenerative - Calcified

## 2014-12-01 NOTE — Anesthesia Postprocedure Evaluation (Signed)
  Anesthesia Post-op Note  Patient: Nicholas Horton  Procedure(s) Performed: Procedure(s): AORTIC VALVE REPLACEMENT (AVR) (N/A) TRANSESOPHAGEAL ECHOCARDIOGRAM (TEE) (N/A) TEMPORARY VENTRICULAR PACING WIRE INSERTION PLACEMENT OF PERMANENT BI-VENTRICULAR PACEMAKER (CRT-P) WITH EPICARDIAL PACING LEADS  Patient Location: ICU  Anesthesia Type:General  Level of Consciousness: sedated and unresponsive  Airway and Oxygen Therapy: Patient remains intubated and on ventilator  Post-op Pain: none  Post-op Assessment: Post-op Vital signs reviewed, Patient's Cardiovascular Status Stable and Respiratory Function Stable  Post-op Vital Signs: Reviewed  Filed Vitals:   12/01/14 1530  BP: 131/68  Pulse: 90  Temp:   Resp: 12    Complications: No apparent anesthesia complications

## 2014-12-01 NOTE — Progress Notes (Signed)
The patient was examined and preop studies reviewed. There has been no change from the prior exam and the patient is ready for surgery.  Plan AVR and pacemaker placement on R Dung

## 2014-12-01 NOTE — Progress Notes (Signed)
Patient ID: Nicholas Horton, male   DOB: [birth year], 39 y.o.   MRN: [id number]  SICU Evening Rounds:   Hemodynamically stable on dop 3, milrinone 0.3, levophed 12, neo 14. CI = 3   Still on vent   Urine output good  CT output low  CBC    Component Value Date/Time   WBC 22.6* 12/01/2014 1553   RBC 2.99* 12/01/2014 1553   HGB 9.4* 12/01/2014 1553   HCT 27.2* 12/01/2014 1553   PLT 154 12/01/2014 1553   MCV 91.0 12/01/2014 1553   MCH 31.4 12/01/2014 1553   MCHC 34.6 12/01/2014 1553   RDW 12.9 12/01/2014 1553   LYMPHSABS 3.0 10/04/2014 0650   MONOABS 0.9 10/04/2014 0650   EOSABS 0.1 10/04/2014 0650   BASOSABS 0.1 10/04/2014 0650     BMET    Component Value Date/Time   NA 138 12/01/2014 1536   K 4.0 12/01/2014 1536   CL 96* 12/01/2014 1331   CO2 27 12/01/2014 0230   GLUCOSE 163* 12/01/2014 1536   BUN 21* 12/01/2014 1331   CREATININE 1.00 12/01/2014 1331   CALCIUM 9.2 12/01/2014 0230   GFRNONAA >60 12/01/2014 0230   GFRAA >60 12/01/2014 0230     A/P:  Stable postop course. Continue current plans

## 2014-12-01 NOTE — Transfer of Care (Signed)
Immediate Anesthesia Transfer of Care Note  Patient: Nicholas Horton  Procedure(s) Performed: Procedure(s): AORTIC VALVE REPLACEMENT (AVR) (N/A) TRANSESOPHAGEAL ECHOCARDIOGRAM (TEE) (N/A) TEMPORARY VENTRICULAR PACING WIRE INSERTION PLACEMENT OF PERMANENT BI-VENTRICULAR PACEMAKER (CRT-P) WITH EPICARDIAL PACING LEADS  Patient Location: SICU  Anesthesia Type:General  Level of Consciousness: sedated and Patient remains intubated per anesthesia plan  Airway & Oxygen Therapy: Patient remains intubated per anesthesia plan and Patient placed on Ventilator (see vital sign flow sheet for setting)  Post-op Assessment: Report given to RN and Post -op Vital signs reviewed and stable  Post vital signs: Reviewed and stable  Last Vitals:  Filed Vitals:   12/01/14 1530  BP: 131/68  Pulse: 90  Temp:   Resp: 12    Complications: No apparent anesthesia complications

## 2014-12-01 NOTE — Anesthesia Preprocedure Evaluation (Addendum)
Anesthesia Evaluation  Patient identified by MRN, date of birth, ID band Patient awake    Reviewed: Allergy & Precautions, H&P , NPO status , Patient's Chart, lab work & pertinent test results  Airway Mallampati: II  TM Distance: >3 FB Neck ROM: Full    Dental no notable dental hx. (+) Edentulous Upper, Edentulous Lower, Dental Advisory Given   Pulmonary neg pulmonary ROS, former smoker,  breath sounds clear to auscultation  Pulmonary exam normal       Cardiovascular + Past MI and +CHF negative cardio ROS  + dysrhythmias + Valvular Problems/Murmurs AS Rhythm:Regular Rate:Normal + Systolic murmurs    Neuro/Psych negative neurological ROS  negative psych ROS   GI/Hepatic negative GI ROS, Neg liver ROS,   Endo/Other  negative endocrine ROS  Renal/GU negative Renal ROS  negative genitourinary   Musculoskeletal   Abdominal   Peds  Hematology negative hematology ROS (+)   Anesthesia Other Findings   Reproductive/Obstetrics negative OB ROS                            Anesthesia Physical Anesthesia Plan  ASA: IV  Anesthesia Plan: General   Post-op Pain Management:    Induction: Intravenous  Airway Management Planned: Oral ETT  Additional Equipment: Arterial line, CVP, PA Cath, TEE and Ultrasound Guidance Line Placement  Intra-op Plan:   Post-operative Plan: Post-operative intubation/ventilation  Informed Consent: I have reviewed the patients History and Physical, chart, labs and discussed the procedure including the risks, benefits and alternatives for the proposed anesthesia with the patient or authorized representative who has indicated his/her understanding and acceptance.   Dental advisory given  Plan Discussed with: CRNA  Anesthesia Plan Comments:         Anesthesia Quick Evaluation

## 2014-12-01 NOTE — Procedures (Signed)
Extubation Procedure Note  Patient Details:   Name: Nicholas Horton DOB: 1975-08-04 MRN: [id number]       Evaluation  O2 sats: stable throughout Complications: No apparent complications Patient did tolerate procedure well. Bilateral Breath Sounds: Clear, Diminished  Pt ability to speak: Yes  Pt able to breath around cuff. FVC 1.0L, NIF -20. Pt extubated to 6L [name] with SpO2 low 90s. Pt switched to 55% VM because pt states he is unable to breath through nose due to congestion. Pt tolerating well at this time will wean fio2 as tolerated.  Fredrich Birks 12/01/2014, 7:54 PM

## 2014-12-01 NOTE — Progress Notes (Signed)
  Echocardiogram Echocardiogram Transesophageal has been performed.  Foster, Artemis Loyal 12/01/2014, 8:44 AM 

## 2014-12-01 NOTE — OR Nursing (Signed)
Off pump call to SICU charge nurse at 1315.

## 2014-12-01 NOTE — Anesthesia Procedure Notes (Signed)
Procedures   The patient was identified and consent obtained.  TO was performed, and full barrier precautions were used.  The skin was anesthetized with lidocaine.  Once the vein was located with the 22 ga. needle using ultrasound guidance , the wire was inserted into the vein.  The wire location was confirmed with ultrasound.  The insertion site was dilated and the introducer was carefully inserted and sutured in place. The PAC was checked, and floated into the PA.  Once in the PA, the catheter was secured. The patient tolerated the procedure well.  CXR was ordered for PACU. Start: 0701 End: 0711 J. Claybon Jabs, MD

## 2014-12-01 NOTE — OR Nursing (Signed)
20 minute call to SICU charge nurse at 1435.

## 2014-12-02 ENCOUNTER — Inpatient Hospital Stay (HOSPITAL_COMMUNITY): Payer: Self-pay

## 2014-12-02 ENCOUNTER — Ambulatory Visit: Payer: Self-pay | Admitting: Cardiology

## 2014-12-02 ENCOUNTER — Encounter (HOSPITAL_COMMUNITY): Payer: Self-pay | Admitting: Cardiothoracic Surgery

## 2014-12-02 LAB — BASIC METABOLIC PANEL
Anion gap: 7 (ref 5–15)
BUN: 13 mg/dL (ref 6–20)
CO2: 26 mmol/L (ref 22–32)
Calcium: 8 mg/dL — ABNORMAL LOW (ref 8.9–10.3)
Chloride: 102 mmol/L (ref 101–111)
Creatinine, Ser: 0.78 mg/dL (ref 0.61–1.24)
GFR calc Af Amer: >60 mL/min (ref >60)
GLUCOSE: 108 mg/dL — AB (ref 65–99)
Potassium: 4.2 mmol/L (ref 3.5–5.1)
Sodium: 135 mmol/L (ref 135–145)

## 2014-12-02 LAB — PREPARE PLATELET PHERESIS
Unit division: 0
Unit division: 0

## 2014-12-02 LAB — CBC
HCT: 26.5 % — ABNORMAL LOW (ref 39.0–52.0)
HCT: 26.1 % — ABNORMAL LOW (ref 39.0–52.0)
Hemoglobin: 9.1 g/dL — ABNORMAL LOW (ref 13.0–17.0)
Hemoglobin: 8.9 g/dL — ABNORMAL LOW (ref 13.0–17.0)
MCH: 30.6 pg (ref 26.0–34.0)
MCH: 30.8 pg (ref 26.0–34.0)
MCHC: 34.3 g/dL (ref 30.0–36.0)
MCHC: 34.1 g/dL (ref 30.0–36.0)
MCV: 89.2 fL (ref 78.0–100.0)
MCV: 90.3 fL (ref 78.0–100.0)
PLATELETS: 157 10*3/uL (ref 150–400)
Platelets: 140 10*3/uL — ABNORMAL LOW (ref 150–400)
RBC: 2.97 MIL/uL — ABNORMAL LOW (ref 4.22–5.81)
RBC: 2.89 MIL/uL — ABNORMAL LOW (ref 4.22–5.81)
RDW: 13.2 % (ref 11.5–15.5)
RDW: 13.1 % (ref 11.5–15.5)
WBC: 14.3 10*3/uL — AB (ref 4.0–10.5)
WBC: 12.8 10*3/uL — ABNORMAL HIGH (ref 4.0–10.5)

## 2014-12-02 LAB — GLUCOSE, CAPILLARY
GLUCOSE-CAPILLARY: 106 mg/dL — AB (ref 65–99)
GLUCOSE-CAPILLARY: 111 mg/dL — AB (ref 65–99)
GLUCOSE-CAPILLARY: 115 mg/dL — AB (ref 65–99)
GLUCOSE-CAPILLARY: 120 mg/dL — AB (ref 65–99)
GLUCOSE-CAPILLARY: 121 mg/dL — AB (ref 65–99)
GLUCOSE-CAPILLARY: 140 mg/dL — AB (ref 65–99)
GLUCOSE-CAPILLARY: 93 mg/dL (ref 65–99)
Glucose-Capillary: 108 mg/dL — ABNORMAL HIGH (ref 65–99)
Glucose-Capillary: 109 mg/dL — ABNORMAL HIGH (ref 65–99)
Glucose-Capillary: 114 mg/dL — ABNORMAL HIGH (ref 65–99)
Glucose-Capillary: 115 mg/dL — ABNORMAL HIGH (ref 65–99)
Glucose-Capillary: 129 mg/dL — ABNORMAL HIGH (ref 65–99)
Glucose-Capillary: 143 mg/dL — ABNORMAL HIGH (ref 65–99)
Glucose-Capillary: 99 mg/dL (ref 65–99)

## 2014-12-02 LAB — POCT I-STAT 3, ART BLOOD GAS (G3+)
Acid-base deficit: 1 mmol/L (ref 0.0–2.0)
Bicarbonate: 23.8 mEq/L (ref 20.0–24.0)
Bicarbonate: 23.2 mEq/L (ref 20.0–24.0)
O2 SAT: 91 %
O2 Saturation: 90 %
PH ART: 7.415 (ref 7.350–7.450)
PO2 ART: 63 mmHg — AB (ref 80.0–100.0)
Patient temperature: 37.8
Patient temperature: 37.8
TCO2: 25 mmol/L (ref 0–100)
TCO2: 24 mmol/L (ref 0–100)
pCO2 arterial: 37.7 mmHg (ref 35.0–45.0)
pCO2 arterial: 36.5 mmHg (ref 35.0–45.0)
pH, Arterial: 7.412 (ref 7.350–7.450)
pO2, Arterial: 60 mmHg — ABNORMAL LOW (ref 80.0–100.0)

## 2014-12-02 LAB — POCT I-STAT, CHEM 8
BUN: 16 mg/dL (ref 6–20)
CALCIUM ION: 1.21 mmol/L (ref 1.12–1.23)
Chloride: 99 mmol/L — ABNORMAL LOW (ref 101–111)
Creatinine, Ser: 0.8 mg/dL (ref 0.61–1.24)
Glucose, Bld: 105 mg/dL — ABNORMAL HIGH (ref 65–99)
HCT: 27 % — ABNORMAL LOW (ref 39.0–52.0)
Hemoglobin: 9.2 g/dL — ABNORMAL LOW (ref 13.0–17.0)
Potassium: 4.2 mmol/L (ref 3.5–5.1)
SODIUM: 134 mmol/L — AB (ref 135–145)
TCO2: 23 mmol/L (ref 0–100)

## 2014-12-02 LAB — PREPARE CRYOPRECIPITATE
Unit division: 0
Unit division: 0

## 2014-12-02 LAB — PROTIME-INR
INR: 1.37 (ref 0.00–1.49)
Prothrombin Time: 16.9 seconds — ABNORMAL HIGH (ref 11.6–15.2)

## 2014-12-02 LAB — PREPARE FRESH FROZEN PLASMA
Unit division: 0
Unit division: 0

## 2014-12-02 LAB — CREATININE, SERUM
CREATININE: 0.75 mg/dL (ref 0.61–1.24)
GFR calc non Af Amer: >60 mL/min (ref >60)

## 2014-12-02 LAB — MAGNESIUM
MAGNESIUM: 2.1 mg/dL (ref 1.7–2.4)
Magnesium: 2 mg/dL (ref 1.7–2.4)

## 2014-12-02 MED ORDER — MORPHINE SULFATE 4 MG/ML IJ SOLN: 4.0000 mg | INTRAMUSCULAR | Status: AC | PRN

## 2014-12-02 MED ORDER — INSULIN ASPART 100 UNIT/ML ~~LOC~~ SOLN
0.0000 [IU] | SUBCUTANEOUS | Status: DC
  Administered 2014-12-03: 2 [IU] via SUBCUTANEOUS

## 2014-12-02 MED ORDER — MILRINONE IN DEXTROSE 20 MG/100ML IV SOLN: 0.1250 ug/kg/min | INTRAVENOUS | Status: DC

## 2014-12-02 MED ORDER — FUROSEMIDE 10 MG/ML IJ SOLN
20.0000 mg | Freq: Two times a day (BID) | INTRAMUSCULAR | Status: AC
  Administered 2014-12-02 – 2014-12-04 (×5): 20 mg via INTRAVENOUS
  Filled 2014-12-02 (×8): qty 2

## 2014-12-02 MED ORDER — NOREPINEPHRINE BITARTRATE 1 MG/ML IV SOLN: 0.0000 ug/min | INTRAVENOUS | Status: DC

## 2014-12-02 MED FILL — Sodium Chloride Irrigation Soln 0.9%: Qty: 2000 | Status: AC

## 2014-12-02 MED FILL — Mannitol IV Soln 20%: INTRAVENOUS | Qty: 500 | Status: AC

## 2014-12-02 MED FILL — Heparin Sodium (Porcine) Inj 1000 Unit/ML: INTRAMUSCULAR | Qty: 30 | Status: AC

## 2014-12-02 MED FILL — Sodium Bicarbonate IV Soln 8.4%: INTRAVENOUS | Qty: 50 | Status: AC

## 2014-12-02 MED FILL — Lidocaine HCl IV Inj 20 MG/ML: INTRAVENOUS | Qty: 10 | Status: AC

## 2014-12-02 MED FILL — Electrolyte-R (PH 7.4) Solution: INTRAVENOUS | Qty: 3000 | Status: AC

## 2014-12-02 NOTE — Progress Notes (Signed)
1 Day Post-Op Procedure(s) (LRB): AORTIC VALVE REPLACEMENT (AVR) (N/A) TRANSESOPHAGEAL ECHOCARDIOGRAM (TEE) (N/A) TEMPORARY VENTRICULAR PACING WIRE INSERTION PLACEMENT OF PERMANENT BI-VENTRICULAR PACEMAKER (CRT-P) WITH EPICARDIAL PACING LEADS Subjective: Stable after AVR, PPM epicardial leads- AS and complete HB Coagulopathy better CXR with basilar atelectasis   Objective: Vital signs in last 24 hours: Temp:  [97.7 F (36.5 C)-100.2 F (37.9 C)] 99.7 F (37.6 C) (07/06 0715) Pulse Rate:  [90-102] 97 (07/06 0715) Cardiac Rhythm:  [-] A-V Sequential paced (07/06 0800) Resp:  [12-31] 20 (07/06 0715) BP: (93-131)/(47-72) 113/66 mmHg (07/06 0715) SpO2:  [88 %-100 %] 97 % (07/06 0715) Arterial Line BP: (93-280)/(51-276) 127/60 mmHg (07/06 0715) FiO2 (%):  [40 %-100 %] 100 % (07/06 0600) Weight:  [216 lb 14.9 oz (98.4 kg)-225 lb (102.059 kg)] 225 lb (102.059 kg) (07/06 0500)  Hemodynamic parameters for last 24 hours: PAP: (20-33)/(10-20) 23/11 mmHg CO:  [6.8 L/min-9.4 L/min] 8.7 L/min CI:  [3 L/min/m2-4.2 L/min/m2] 3.9 L/min/m2  Intake/Output from previous day: 07/05 0701 - 07/06 0700 In: 8517.1 [I.V.:4935.1; Blood:2182; IV Piggyback:1400] Out: 5760 [Urine:4010; Blood:1200; Chest Tube:550] Intake/Output this shift: Total I/O In: 274.2 [I.V.:74.2; IV Piggyback:200] Out: 110 [Urine:60; Chest Tube:50]  Neuro intact sharp valve closure sound  Lab Results:  Recent Labs  12/01/14 2130 12/01/14 2131 12/02/14 0400  WBC 16.8*  --  14.3*  HGB 9.5* 9.2* 9.1*  HCT 27.8* 27.0* 26.5*  PLT 188  --  157   BMET:  Recent Labs  12/01/14 0230  12/01/14 2131 12/02/14 0400  NA 137  < > 136 135  K 3.9  < > 4.6 4.2  CL 99*  < > 101 102  CO2 27  --   --  26  GLUCOSE 101*  < > 148* 108*  BUN 23*  < > 18 13  CREATININE 1.14  < > 0.90 0.78  CALCIUM 9.2  --   --  8.0*  < > = values in this interval not displayed.  PT/INR:  Recent Labs  12/02/14 0400  LABPROT 16.9*  INR 1.37    ABG    Component Value Date/Time   PHART 7.415 12/02/2014 0535   HCO3 23.2 12/02/2014 0535   TCO2 24 12/02/2014 0535   ACIDBASEDEF 1.0 12/02/2014 0535   O2SAT 90.0 12/02/2014 0535   CBG (last 3)   Recent Labs  12/01/14 2200 12/01/14 2307 12/02/14 0005  GLUCAP 146* 143* 140*    Assessment/Plan: S/P Procedure(s) (LRB): AORTIC VALVE REPLACEMENT (AVR) (N/A) TRANSESOPHAGEAL ECHOCARDIOGRAM (TEE) (N/A) TEMPORARY VENTRICULAR PACING WIRE INSERTION PLACEMENT OF PERMANENT BI-VENTRICULAR PACEMAKER (CRT-P) WITH EPICARDIAL PACING LEADS Mobilize Diuresis Diabetes control d/c tubes/lines See progression orders   LOS: 11 days    Nicholas Horton 12/02/2014

## 2014-12-02 NOTE — Care Management Note (Signed)
Case Management Note  Patient Details  Name: Nicholas Horton MRN: [id number] Date of Birth: 04-May-1976  Subjective/Objective:           Fiance, [name] in room, States will be with patient on discharge 24/7.  Post op AVR 7-5.  Remains on rebreather mask.  Sitting up in chair.  Nurse states physically did well getting to chair, but desats with any movement.  CM will continue to follow.     Action/Plan:   Expected Discharge Date:  11/24/14               Expected Discharge Plan:  Home/Self Care  In-House Referral:  Clinical Social Work  Discharge planning Services  CM Consult  Post Acute Care Choice:    Choice offered to:     DME Arranged:    DME Agency:     HH Arranged:    HH Agency:     Status of Service:  In process, will continue to follow  Medicare Important Message Given:    Date Medicare IM Given:    Medicare IM give by:    Date Additional Medicare IM Given:    Additional Medicare Important Message give by:     If discussed at Long Length of Stay Meetings, dates discussed:    Additional Comments:  Vangie Bicker, RN 12/02/2014, 11:29 AM

## 2014-12-02 NOTE — Progress Notes (Signed)
      [address]       [place] [address]             [phone]      Resting comfortably  BP 116/65 mmHg  Pulse 87  Temp(Src) 97.5 F (36.4 C) (Oral)  Resp 17  Ht 6' 2.02" (1.88 m)  Wt 225 lb (102.059 kg)  BMI 28.88 kg/m2  SpO2 98%   Intake/Output Summary (Last 24 hours) at 12/02/14 1754 Last data filed at 12/02/14 1700  Gross per 24 hour  Intake 2243.05 ml  Output   2915 ml  Net -671.95 ml   Creatinine 0.75, Hct 26  Doing well POD # 1  Joneric Streight C. Dorris Fetch, MD [facility] [phone]

## 2014-12-03 ENCOUNTER — Inpatient Hospital Stay (HOSPITAL_COMMUNITY): Payer: Self-pay

## 2014-12-03 ENCOUNTER — Encounter (HOSPITAL_COMMUNITY): Payer: Self-pay | Admitting: Cardiothoracic Surgery

## 2014-12-03 LAB — CBC
HCT: 24 % — ABNORMAL LOW (ref 39.0–52.0)
HCT: 25.8 % — ABNORMAL LOW (ref 39.0–52.0)
Hemoglobin: 8.1 g/dL — ABNORMAL LOW (ref 13.0–17.0)
Hemoglobin: 8.6 g/dL — ABNORMAL LOW (ref 13.0–17.0)
MCH: 30.8 pg (ref 26.0–34.0)
MCH: 30.7 pg (ref 26.0–34.0)
MCHC: 33.8 g/dL (ref 30.0–36.0)
MCHC: 33.3 g/dL (ref 30.0–36.0)
MCV: 91.3 fL (ref 78.0–100.0)
MCV: 92.1 fL (ref 78.0–100.0)
Platelets: 129 10*3/uL — ABNORMAL LOW (ref 150–400)
Platelets: 170 10*3/uL (ref 150–400)
RBC: 2.63 MIL/uL — ABNORMAL LOW (ref 4.22–5.81)
RBC: 2.8 MIL/uL — ABNORMAL LOW (ref 4.22–5.81)
RDW: 13.3 % (ref 11.5–15.5)
RDW: 13.4 % (ref 11.5–15.5)
WBC: 13.4 10*3/uL — ABNORMAL HIGH (ref 4.0–10.5)
WBC: 16.8 10*3/uL — ABNORMAL HIGH (ref 4.0–10.5)

## 2014-12-03 LAB — GLUCOSE, CAPILLARY
GLUCOSE-CAPILLARY: 113 mg/dL — AB (ref 65–99)
GLUCOSE-CAPILLARY: 102 mg/dL — AB (ref 65–99)
GLUCOSE-CAPILLARY: 106 mg/dL — AB (ref 65–99)
GLUCOSE-CAPILLARY: 96 mg/dL (ref 65–99)
GLUCOSE-CAPILLARY: 106 mg/dL — AB (ref 65–99)
GLUCOSE-CAPILLARY: 113 mg/dL — AB (ref 65–99)
Glucose-Capillary: 102 mg/dL — ABNORMAL HIGH (ref 65–99)
Glucose-Capillary: 114 mg/dL — ABNORMAL HIGH (ref 65–99)
Glucose-Capillary: 129 mg/dL — ABNORMAL HIGH (ref 65–99)
Glucose-Capillary: 99 mg/dL (ref 65–99)

## 2014-12-03 LAB — POCT I-STAT, CHEM 8
BUN: 21 mg/dL — ABNORMAL HIGH (ref 6–20)
CHLORIDE: 96 mmol/L — AB (ref 101–111)
CREATININE: 1.1 mg/dL (ref 0.61–1.24)
Calcium, Ion: 1.19 mmol/L (ref 1.12–1.23)
Glucose, Bld: 95 mg/dL (ref 65–99)
HCT: 27 % — ABNORMAL LOW (ref 39.0–52.0)
Hemoglobin: 9.2 g/dL — ABNORMAL LOW (ref 13.0–17.0)
Potassium: 4.3 mmol/L (ref 3.5–5.1)
SODIUM: 134 mmol/L — AB (ref 135–145)
TCO2: 27 mmol/L (ref 0–100)

## 2014-12-03 LAB — BASIC METABOLIC PANEL
Anion gap: 8 (ref 5–15)
BUN: 12 mg/dL (ref 6–20)
CO2: 25 mmol/L (ref 22–32)
Calcium: 8 mg/dL — ABNORMAL LOW (ref 8.9–10.3)
Chloride: 100 mmol/L — ABNORMAL LOW (ref 101–111)
Creatinine, Ser: 0.66 mg/dL (ref 0.61–1.24)
GFR calc Af Amer: >60 mL/min (ref >60)
GFR calc non Af Amer: >60 mL/min (ref >60)
Glucose, Bld: 99 mg/dL (ref 65–99)
Potassium: 4 mmol/L (ref 3.5–5.1)
Sodium: 133 mmol/L — ABNORMAL LOW (ref 135–145)

## 2014-12-03 LAB — PROTIME-INR
INR: 1.5 — ABNORMAL HIGH (ref 0.00–1.49)
Prothrombin Time: 18.2 seconds — ABNORMAL HIGH (ref 11.6–15.2)

## 2014-12-03 MED ORDER — POTASSIUM CHLORIDE 10 MEQ/50ML IV SOLN
10.0000 meq | INTRAVENOUS | Status: AC
  Administered 2014-12-03 (×2): 10 meq via INTRAVENOUS
  Filled 2014-12-03 (×2): qty 50

## 2014-12-03 MED ORDER — WARFARIN SODIUM 2.5 MG PO TABS
2.5000 mg | ORAL_TABLET | Freq: Every day | ORAL | Status: DC
  Administered 2014-12-03 – 2014-12-05 (×3): 2.5 mg via ORAL
  Filled 2014-12-03 (×4): qty 1

## 2014-12-03 MED ORDER — WARFARIN - PHYSICIAN DOSING INPATIENT
Freq: Every day | Status: DC
  Administered 2014-12-05 – 2014-12-06 (×2)

## 2014-12-03 MED ORDER — FE FUMARATE-B12-VIT C-FA-IFC PO CAPS
1.0000 | ORAL_CAPSULE | Freq: Three times a day (TID) | ORAL | Status: DC
  Administered 2014-12-03 – 2014-12-08 (×17): 1 via ORAL
  Filled 2014-12-03 (×21): qty 1

## 2014-12-03 MED ORDER — KETOROLAC TROMETHAMINE 15 MG/ML IJ SOLN
15.0000 mg | Freq: Four times a day (QID) | INTRAMUSCULAR | Status: DC
  Administered 2014-12-03 (×3): 15 mg via INTRAVENOUS
  Filled 2014-12-03 (×3): qty 1

## 2014-12-03 MED ORDER — BOOST / RESOURCE BREEZE PO LIQD
1.0000 | Freq: Three times a day (TID) | ORAL | Status: DC
  Administered 2014-12-03 – 2014-12-08 (×13): 1 via ORAL

## 2014-12-03 MED ORDER — ALBUMIN HUMAN 5 % IV SOLN
12.5000 g | Freq: Once | INTRAVENOUS | Status: AC
  Administered 2014-12-03: 12.5 g via INTRAVENOUS
  Filled 2014-12-03: qty 250

## 2014-12-03 NOTE — Progress Notes (Signed)
Nutrition Follow-up  DOCUMENTATION CODES:  Not applicable  INTERVENTION:  Resource Breeze po TID, each supplement provides 250 kcal and 9 grams of protein  NEW NUTRITION DIAGNOSIS:  Increased nutrient needs related to  (post op healing) as evidenced by estimated needs, ongoing  GOAL:  Patient will meet greater than or equal to 90% of their needs, progressing  MONITOR:  PO intake, Supplement acceptance, Labs, Weight trends, I & O's  ASSESSMENT: 39 yo male with history of non-ischemic cardiomyopathy, bicuspid aortic valve with moderate aortic stenosis, transient high grade AV block, chronic combined systolic and diastolic CHF, former tobacco abuse who was admitted to [facility] early this am after a syncopal episode.  Patient s/p procedure 6/29: MULTIPLE TOOTH EXTRACTIONS ALVEOLOPLASTY  Patient s/p procedures 7/5: AORTIC VALVE REPLACEMENT  IMPLANT OF PERMANENT DUAL CHAMBER PACEMAKER LEADS PLACEMENT OF TEMPORARY TRANSVENOUS PACING WIRE  Patient currently on Soft diet.  Clear liquid meal present on tray table upon RD visit.  Pt reported he's "barely" eating as he held up (then placed back down) an empty jello container.  Increased nutrient needs ongoing.  Pt amenable to trying oral nutrition supplement to optimize kcal, protein intake.  RD to order.  Nutrition focused physical exam completed.  No muscle or subcutaneous fat depletion noticed.  Height:  Ht Readings from Last 1 Encounters:  12/01/14 6' 2.02" (1.88 m)    Weight:  Wt Readings from Last 1 Encounters:  12/03/14 221 lb (100.245 kg)    Ideal Body Weight:  86.4 kg  Wt Readings from Last 10 Encounters:  12/03/14 221 lb (100.245 kg)  11/21/14 218 lb 9.6 oz (99.156 kg)  10/14/14 216 lb 8 oz (98.204 kg)  10/07/14 217 lb 2.5 oz (98.5 kg)  10/04/14 219 lb 5.7 oz (99.5 kg)    BMI:  Body mass index is 28.36 kg/(m^2).  Estimated Nutritional Needs:  Kcal:  2000-2200  Protein:  100-110 grams  Fluid:  2.0-2.2  L  Skin:  Reviewed, no issues  Diet Order:  DIET SOFT Room service appropriate?: Yes; Fluid consistency:: Thin  EDUCATION NEEDS:  No education needs identified at this time   Intake/Output Summary (Last 24 hours) at 12/03/14 1143 Last data filed at 12/03/14 1100  Gross per 24 hour  Intake 2150.3 ml  Output   2500 ml  Net -349.7 ml    Last BM:  7/3  Maureen Chatters, RD, LDN Pager #: [phone] After-Hours Pager #: [phone]

## 2014-12-03 NOTE — Op Note (Signed)
NAME:  Nicholas Horton, Nicholas Horton NO.:  [id number]  MEDICAL RECORD NO.:  [id number]  LOCATION:  2S16C                        FACILITY:  [facility]  PHYSICIAN:  Kerin Perna, M.D.  DATE OF BIRTH:  09/28/1975  DATE OF PROCEDURE:  12/01/2014 DATE OF DISCHARGE:                              OPERATIVE REPORT   OPERATION: 1. Aortic valve replacement with a 21 mm mechanical CarboMedics     supraannular valve-serial #[id number]. 2. Placement of permanent epicardial lead for biventricular pacemaker.-     Medtronic CRT-P pacemaker, catalog number [id number]. 3. Placement of temporary transvenous pacemaker via right femoral vein     for complete heart block preoperatively.  SURGEON:  Kerin Perna, M.D.  ANESTHESIA:  General by Dr. Sampson Goon.  PREOPERATIVE DIAGNOSES: 1. Severe aortic stenosis with acute systolic heart failure. 2. Third-degree heart block.  POSTOPERATIVE DIAGNOSES: 1. Severe aortic stenosis with acute systolic heart failure. 2. Third-degree heart block.  CLINICAL NOTE:  The patient is a 39 year old Caucasian male who presented with symptoms of heart failure and syncope.  He had similar problems with a previous episode and hospitalization.  Previous echo and cardiac cath demonstrated normal coronaries with severe aortic stenosis and heart block.  The patient was treated medically and discharged to home, but returned with recurrent symptoms.  A temporary transvenous pacing wire was placed and the patient was evaluated for surgery by Dr. Dorris Fetch.  Dr. Dorris Fetch asked for dental consult due to severe dental disease and necrotic teeth.  Dental surgery recommended multiple dental extractions and this was performed without complication under general anesthesia.  After the patient's mouth healed and after a resistant E. coli UTI has been adequately treated, the patient was prepared for combined AVR with placement of a permanent epicardial lead pacing system.  The  patient was evaluated by electrophysiology, cardiology, and had a temporary transvenous pacing wire placed via the right neck at the time of dental surgery.  This was maintained until he underwent his cardiac operation.  The EP cardiologist Dr. Ladona Ridgel recommended epicardial pacing leads in this young patient so that he would not have transvenous wires in his heart for several decades.  I discussed the procedure of AVR and permanent pacemaker placement with the patient including the indications, benefits, alternatives, and risks.  He understood the location of the surgical incisions, use of general anesthesia and cardiopulmonary bypass, and expected postoperative hospital recovery.  He understood the risks of this operation to include, to include the risks of stroke, bleeding, blood transfusion requirement, postoperative pulmonary problems including pleural effusion, recurrent atrial or ventricular arrhythmias, and death.  After reviewing these issues, he demonstrated his understanding and agreed to proceed with surgery under what I felt was an informed consent.  OPERATIVE FINDINGS: 1. Severely dysplastic aortic valve which appeared almost near the     cuspid with heavy calcification and thickened tissue.  This was     successfully placed with a 21 mm supraannular CarboMedics valve. 2. Improved global LV function following separation from     cardiopulmonary bypass. 3. Successful placement of a biventricular epicardial lead system     including an epicardial lead to the lateral LV wall, epicardial  lead to the RV anterior wall, and 2  unipolar lead sewn to the     outside of the right atrial appendage.  All this was connected to     the pacemaker generator placed under the left clavicle in a     subcutaneous pocket. 4. Postoperative coagulopathy requiring blood component replacement     and FFP and platelets.  OPERATIVE PROCEDURE:  The patient was brought to the operating room  and placed supine on the operating table and general anesthesia was induced. A transesophageal echo probe was placed.  The right groin was prepped and draped as a sterile field.  A proper time-out was performed.  Using the Seldinger technique, the right femoral vein was cannulated and guidewire was passed.  Over the guidewire, a dilator then a sheath was inserted.  Through this sheath, the transvenous bipolar pacemaker was placed using the inflated balloon to pass through the tricuspid valve into the RV.  Pacemaker capturing was documented and the catheter was secured to the skin and a sterile dressing was applied.  After the right neck percutaneous temporary pacemaker was removed so as to remove the skin incision from the sternal area, the patient was completely prepped and draped as a sterile field.  A second proper time- out was performed.  A sternal incision was made.  The sternum was retracted.  An incision was made beneath the clavicle at this point to create a pacemaker generator pocket prior to giving the large dose of heparin.  The pericardium was opened and suspended.  The heart was inspected. There was LV enlargement.  There is RV enlargement.  PA pressures were elevated.  The ascending aorta was smooth and soft, but was smaller than would be expected.  Pursestrings were placed in the ascending aorta at the innominate arch junction as well as the right atrium and heparin was administered.  The patient was cannulated and placed on cardiopulmonary bypass.  A vent was placed via the right superior pulmonary vein and cardioplegia cannulas were placed both antegrade and retrograde cold blood cardioplegia.  The pulmonary artery was dissected off the aorta. The patient was cooled to 32 degrees.  The aortic crossclamp was applied.  Cardioplegic arrest was achieved with both antegrade and retrograde cold blood cardioplegia.  While the heart was arrested, the LV epicardial lead was  screwed into the lateral LV while gently displacing the heart.  Previously, the RV epicardial unipolar lead was screwed into the anterior RV and secured to the epicardium with extra sutures and unipolar right atrial leads were sewn to the atrial appendage and secured with Prolene suture.  These were later tunneled through the pericardium into the chest wall into the pocket previously made for the pacemaker generator.  Attention was directed to the aortic valve.  A transverse aortotomy was performed.  Almost complete transection of the aorta was required.  The valve was heavily diseased, calcified, and very dysmorphic.  It was excised completely and the annulus was debrided of calcium.  It was irrigated with copious amounts of cold saline.  The annulus was sized to a 21 mm CarboMedics supraannular top hat valve.  Subannular 2-0 Ethibond pledgeted sutures were placed around the annulus.  These sutures were then placed through the sewing ring of the valve and the valve was seated.  Although it was difficult to "get the valve through the sino- tubular junction, but this was successfully completed.  The valve leaflets opened and closed without impingement or obstruction.  There  was no obstruction of the coronary ostia.  The valve seating was checked with a small right angle, and there was no space for perivalvular leak around the annulus.  The aortotomy was then carefully closed with a running 4-0 Prolene.  Prior to removing the crossclamp, air was removed from the coronaries with a dose of retrograde warm blood cardioplegia through an aortic vent above the suture line.  The crossclamp was removed.  The heart was reperfused.  The heart was cardioverted back for a regular rhythm.  The cardioplegia cannulas were removed.  Temporary pacing wires were applied and brought out the usual way.  The aortotomy was checked and found to be hemostatic.  The permanent epicardial wires were then connected  to the generator for the LV, RV, and RA slots.  They were securely connected.  The generator was then positioned into the pocket. We paced the patient with a temporary epicardial wires above the intrinsic rate for the permanent pacing system.  After the patient was adequate reperfused and rewarmed, low-dose inotropes were started and the patient was weaned from cardiopulmonary bypass.  The echo showed good ventricular function and normal function of the aortic valve with minimal gradient and no AI.  The patient remained stable with excellent hemodynamics.  Protamine was administered without adverse reaction.  The cannulas were carefully removed especially from the ascending aorta as the aortic tissue was thin and friable.  There was good hemostasis at the cannulation site, however, there was diffuse coagulopathy in the mediastinum, sternum, and the entire operative field.  Blood factors including platelets and FFP were administered with improved coagulation function.  The mediastinum was irrigated with warm saline.  The pacing leads were positioned so as not to cross the midline anteriorly in case the patient needed future sternotomy.  The superior pericardium was closed over the aorta. Anterior mediastinal and left pleural chest tube were placed and brought through separate incisions.  The sternum was closed with interrupted wire.  The pectoralis fascia was closed in running #1 Vicryl and subcutaneous and skin layers were closed in running Vicryl.  The pacemaker pocket and pocket was closed in running 2-0 Vicryl for the fascia, interrupted 3-0 Vicryl for the subcutaneous layer, and a running subcuticular skin suture.  The chest tubes were connected to underwater seal Pleur-Evac suction and sterile dressings were applied.  The temporary pacing wire was removed from right femoral vein and compression was held for 10 minutes.  The patient then returned to the ICU in stable  condition.     Kerin Perna, M.D.     PV/MEDQ  D:  12/02/2014  T:  12/03/2014  Job:  [id number]  cc:   Landry Corporal, MD

## 2014-12-03 NOTE — Progress Notes (Signed)
   12/03/14 1800  Vitals  BP (!) 94/54 mmHg  MAP (mmHg) 63  Pulse Rate 74  ECG Heart Rate 74  Resp (!) 25  Oxygen Therapy  SpO2 92 %  Dr. Maren Beach notified of decreased blood pressures, ambulation today and chest tube drainage. Order received for albumin. Also notified of creat results. D/c toradol.

## 2014-12-03 NOTE — Progress Notes (Signed)
2 Days Post-Op Procedure(s) (LRB): AORTIC VALVE REPLACEMENT (AVR) (N/A) TRANSESOPHAGEAL ECHOCARDIOGRAM (TEE) (N/A) TEMPORARY VENTRICULAR PACING WIRE INSERTION PLACEMENT OF PERMANENT BI-VENTRICULAR PACEMAKER (CRT-P) WITH EPICARDIAL PACING LEADS Subjective: OOB to chair CXR  LLL atelectasis A-v pacing Postop anemia Starting coumadin for mechanical AVR Objective: Vital signs in last 24 hours: Temp:  [97.4 F (36.3 C)-100 F (37.8 C)] 99.1 F (37.3 C) (07/07 0700) Pulse Rate:  [74-96] 89 (07/07 0700) Cardiac Rhythm:  [-] A-V Sequential paced (07/07 0400) Resp:  [11-28] 28 (07/07 0700) BP: (94-116)/(51-73) 106/59 mmHg (07/07 0700) SpO2:  [89 %-100 %] 89 % (07/07 0700) Arterial Line BP: (104-133)/(46-67) 125/49 mmHg (07/07 0600) FiO2 (%):  [55 %] 55 % (07/07 0400) Weight:  [221 lb (100.245 kg)] 221 lb (100.245 kg) (07/07 0500)  Hemodynamic parameters for last 24 hours:  stable of drips  Intake/Output from previous day: 07/06 0701 - 07/07 0700 In: 2080 [P.O.:1080; I.V.:500; IV Piggyback:500] Out: 2765 [Urine:2325; Chest Tube:440] Intake/Output this shift:    Neuro intact decreased BS L base  Lab Results:  Recent Labs  12/02/14 1645 12/03/14 0515  WBC 12.8* 13.4*  HGB 8.9* 8.1*  HCT 26.1* 24.0*  PLT 140* 129*   BMET:  Recent Labs  12/02/14 0400 12/02/14 1638 12/02/14 1645 12/03/14 0515  NA 135 134*  --  133*  K 4.2 4.2  --  4.0  CL 102 99*  --  100*  CO2 26  --   --  25  GLUCOSE 108* 105*  --  99  BUN 13 16  --  12  CREATININE 0.78 0.80 0.75 0.66  CALCIUM 8.0*  --   --  8.0*    PT/INR:  Recent Labs  12/03/14 0515  LABPROT 18.2*  INR 1.50*   ABG    Component Value Date/Time   PHART 7.415 12/02/2014 0535   HCO3 23.2 12/02/2014 0535   TCO2 23 12/02/2014 1638   ACIDBASEDEF 1.0 12/02/2014 0535   O2SAT 90.0 12/02/2014 0535   CBG (last 3)   Recent Labs  12/02/14 1927 12/02/14 2354 12/03/14 0356  GLUCAP 115* 99 96    Assessment/Plan: S/P  Procedure(s) (LRB): AORTIC VALVE REPLACEMENT (AVR) (N/A) TRANSESOPHAGEAL ECHOCARDIOGRAM (TEE) (N/A) TEMPORARY VENTRICULAR PACING WIRE INSERTION PLACEMENT OF PERMANENT BI-VENTRICULAR PACEMAKER (CRT-P) WITH EPICARDIAL PACING LEADS Mobilize Diuresis d/c tubes/lines start coumadin for avr   LOS: 12 days    [name] III 12/03/2014

## 2014-12-03 NOTE — Progress Notes (Signed)
CT surgery p.m. Rounds  Patient was able ambulate today Permanent pacemaker functioning well Atelectasis left lower lobe with persistent chest tube drainage P.m. labs are reviewed and are satisfactory

## 2014-12-03 NOTE — Plan of Care (Signed)
Problem: Phase II - Intermediate Post-Op Goal: Wean to Extubate Outcome: Completed/Met Date Met:  12/03/14 Extubated in <6 hours Goal: Maintain Hemodynamic Stability Outcome: Completed/Met Date Met:  12/03/14 Off all pressors Goal: Advance Diet Outcome: Progressing Clear liquid diet. Goal: Activity Progressed Outcome: Progressing OOB to chair

## 2014-12-04 ENCOUNTER — Inpatient Hospital Stay (HOSPITAL_COMMUNITY): Payer: Self-pay

## 2014-12-04 LAB — GLUCOSE, CAPILLARY
GLUCOSE-CAPILLARY: 101 mg/dL — AB (ref 65–99)
Glucose-Capillary: 84 mg/dL (ref 65–99)

## 2014-12-04 LAB — COMPREHENSIVE METABOLIC PANEL
ALT: 13 U/L — ABNORMAL LOW (ref 17–63)
AST: 21 U/L (ref 15–41)
Albumin: 2.8 g/dL — ABNORMAL LOW (ref 3.5–5.0)
Alkaline Phosphatase: 43 U/L (ref 38–126)
Anion gap: 7 (ref 5–15)
BUN: 16 mg/dL (ref 6–20)
CO2: 28 mmol/L (ref 22–32)
Calcium: 8.2 mg/dL — ABNORMAL LOW (ref 8.9–10.3)
Chloride: 99 mmol/L — ABNORMAL LOW (ref 101–111)
Creatinine, Ser: 0.73 mg/dL (ref 0.61–1.24)
GFR calc Af Amer: >60 mL/min (ref >60)
GFR calc non Af Amer: >60 mL/min (ref >60)
Glucose, Bld: 87 mg/dL (ref 65–99)
Potassium: 4 mmol/L (ref 3.5–5.1)
Sodium: 134 mmol/L — ABNORMAL LOW (ref 135–145)
Total Bilirubin: 0.9 mg/dL (ref 0.3–1.2)
Total Protein: 5.5 g/dL — ABNORMAL LOW (ref 6.5–8.1)

## 2014-12-04 LAB — CBC
HCT: 21.4 % — ABNORMAL LOW (ref 39.0–52.0)
Hemoglobin: 7.1 g/dL — ABNORMAL LOW (ref 13.0–17.0)
MCH: 30.7 pg (ref 26.0–34.0)
MCHC: 33.2 g/dL (ref 30.0–36.0)
MCV: 92.6 fL (ref 78.0–100.0)
Platelets: 154 10*3/uL (ref 150–400)
RBC: 2.31 MIL/uL — ABNORMAL LOW (ref 4.22–5.81)
RDW: 13.6 % (ref 11.5–15.5)
WBC: 11.8 10*3/uL — ABNORMAL HIGH (ref 4.0–10.5)

## 2014-12-04 LAB — TYPE AND SCREEN
ABO/RH(D): A NEG
Antibody Screen: NEGATIVE
Unit division: 0
Unit division: 0
Unit division: 0
Unit division: 0
Unit division: 0

## 2014-12-04 LAB — PROTIME-INR
INR: 1.55 — ABNORMAL HIGH (ref 0.00–1.49)
Prothrombin Time: 18.6 seconds — ABNORMAL HIGH (ref 11.6–15.2)

## 2014-12-04 LAB — URINE CULTURE
Culture: NO GROWTH
Special Requests: NORMAL

## 2014-12-04 LAB — PREPARE RBC (CROSSMATCH)

## 2014-12-04 MED ORDER — MAGNESIUM HYDROXIDE 400 MG/5ML PO SUSP: 30.0000 mL | Freq: Every day | ORAL | Status: DC | PRN

## 2014-12-04 MED ORDER — SODIUM CHLORIDE 0.9 % IV SOLN: Freq: Once | INTRAVENOUS | Status: DC

## 2014-12-04 MED ORDER — FUROSEMIDE 40 MG PO TABS
40.0000 mg | ORAL_TABLET | Freq: Every day | ORAL | Status: DC
  Administered 2014-12-05 – 2014-12-06 (×2): 40 mg via ORAL
  Filled 2014-12-04 (×3): qty 1

## 2014-12-04 MED ORDER — ASPIRIN EC 81 MG PO TBEC
81.0000 mg | DELAYED_RELEASE_TABLET | Freq: Every day | ORAL | Status: DC
  Administered 2014-12-04 – 2014-12-08 (×5): 81 mg via ORAL
  Filled 2014-12-04 (×5): qty 1

## 2014-12-04 MED ORDER — ASPIRIN 81 MG PO CHEW: 81.0000 mg | CHEWABLE_TABLET | Freq: Every day | ORAL | Status: DC

## 2014-12-04 MED ORDER — POTASSIUM CHLORIDE CRYS ER 20 MEQ PO TBCR
20.0000 meq | EXTENDED_RELEASE_TABLET | Freq: Every day | ORAL | Status: DC
  Administered 2014-12-04 – 2014-12-05 (×2): 20 meq via ORAL
  Filled 2014-12-04 (×3): qty 1

## 2014-12-04 MED ORDER — SODIUM CHLORIDE 0.9 % IJ SOLN
3.0000 mL | Freq: Two times a day (BID) | INTRAMUSCULAR | Status: DC
  Administered 2014-12-04 – 2014-12-08 (×8): 3 mL via INTRAVENOUS

## 2014-12-04 MED ORDER — SODIUM CHLORIDE 0.9 % IJ SOLN
3.0000 mL | INTRAMUSCULAR | Status: DC | PRN
  Administered 2014-12-07: 3 mL via INTRAVENOUS
  Filled 2014-12-04: qty 3

## 2014-12-04 MED ORDER — MOVING RIGHT ALONG BOOK
Freq: Once | Status: AC
Start: 2014-12-04 — End: 2014-12-04
  Filled 2014-12-04: qty 1

## 2014-12-04 MED FILL — Heparin Sodium (Porcine) Inj 1000 Unit/ML: INTRAMUSCULAR | Qty: 30 | Status: AC

## 2014-12-04 MED FILL — Potassium Chloride Inj 2 mEq/ML: INTRAVENOUS | Qty: 40 | Status: AC

## 2014-12-04 MED FILL — Magnesium Sulfate Inj 50%: INTRAMUSCULAR | Qty: 10 | Status: AC

## 2014-12-04 NOTE — Discharge Summary (Signed)
[address]       [place] [address]             [id number]              Discharge Summary  Name: Nicholas Horton DOB: 06/12/1975 39 y.o. MRN: [id number]   Admission Date: 11/21/2014 Discharge Date: 12/08/2014    Admitting Diagnosis: Syncope Moderate to severe aortic stenosis Bicuspid aortic valve   Discharge Diagnosis:  Patient Active Problem List   Diagnosis Date Noted  . S/P AVR 12/01/2014  . AS (aortic stenosis)   . Complete heart block 11/23/2014  . E. Coli UTI 11/23/2014  . Acute on chronic combined systolic and diastolic CHF  11/22/2014  . Chest pain 11/21/2014  . Elevated troponin 11/21/2014  . Syncope 11/21/2014  . Paradentosis 11/21/2014  . Nonischemic cardiomyopathy-EF 50-55% echo 11/20/14 10/16/2014  . Tobacco use disorder 10/07/2014  . Acute respiratory failure 10/07/2014  . Bicuspid aortic valve   . NSTEMI, initial episode of care 10/04/2014  . Third degree heart block 10/04/2014  . Hypoxia 10/04/2014    Class: Acute  . Acute combined systolic and diastolic heart failure, NYHA class 3 10/04/2014  . Dyspnea 10/04/2014  . Third degree AV block 10/04/2014  . Moderate to severe AS with bicuspid valve. Mean 33, peak 61 mmHg. 10/04/2014  Dental caries and chronic periodontitis   Procedures: 1. Multiple dental extractions. - 11/25/2014  2. Insertion of temporary permanent transvenous pacemaker - 11/25/2014  3. AORTIC VALVE REPLACEMENT (21 mm Carbomedics Mechanical valve) PLACEMENT OF TEMPORARY VENTRICULAR PACEMAKER VIA RIGHT FEMORAL VEIN  PLACEMENT OF PERMANENT EPICARDIAL PACING LEADS FOR BI-VENTRICULAR PACEMAKER  - 12/01/2014   HPI:  The patient is a 39 y.o. male with history of non-ischemic cardiomyopathy, bicuspid aortic valve with moderate aortic stenosis, transient high grade AV block, chronic combined systolic and diastolic CHF, former tobacco abuse who was admitted to [facility] after a syncopal episode. He is followed in  the [facility] office by Dr. Herbie Baltimore. He was admitted to [facility] May 8-11, 2016 with chest pain and complete heart block. He was taken emergently to the cath lab that day and had a temporary pacemaker placed. Cardiac cath 10/04/14 with minimal plaque (20%) in the mid LAD and no other disease noted. LVEF was noted to be 20-25% by LV gram. He was felt to have a non-ischemic cardiomyopathy. The conduction abnormality normalized. He was seen by EP (Dr. Ladona Ridgel) and no permanent pacemaker was arranged. Cardiac MRI 10/05/14 did not suggest infiltrative disease. LVEF noted to be 43%. He was diuresed and had symptomatic improvement. Troponin peaked at 0.19. Outpatient echo 11/20/14 with normal LV systolic function (LVEF=50-55%), moderately severe AS with mean gradient of 33 mmHg and peak gradient of 61 mmHg. He was last seen in the office by Dr. Herbie Baltimore 10/14/14.  The patient was seen in the ER at [facility] on 11/21/14 after having  a syncopal event while playing cards. He states that he "blacked out" for at least a few seconds. He has noted abdominal fullness over the last few days but no dyspnea. No LE edema. His chest wall has been sore. He denies any palpitations or dizziness preceding the syncopal event. He is currently in sinus rhythm. He does have a long PR interval. Troponin is 0.38. Labs are otherwise within normal limits. He was transferred to [facility] for further cardiac management.    Hospital Course:  The patient was admitted to [facility] on 11/21/2014. A cardiac surgery consult was requested for evaluation of his aortic stenosis.  Dr. Dorris Fetch initially saw the patient and felt that he would require aortic valve replacement. Prior to surgery consideration, the patient was seen by the dental service and underwent extractions of his remaining teeth on 11/25/2014.   The patient then was seen by Electrophysiology to evaluate his complete heart block.  It was felt that he should undergo  insertion of a temporary transvenous pacemaker, and this was performed on 11/25/2014.  He was treated for recurrent E coli UTI with IV Imipenem. A CT of the abdomen and pelvis showed no anatomic abnormalities other than a small right kidney stone. Dr. Donata Clay saw the patient in follow up and once he had recovered adequately from his dental surgery, it was felt that he should proceed with AVR. All risks, benefits and alternatives of surgery were explained in detail, and the patient agreed to proceed. The patient was taken to the operating room and underwent the above procedure.    The patient was extubated early the evening of surgery without difficulty. He remained afebrile and hemodynamically stable. Theone Murdoch, a line, chest tubes, and foley were removed early in the post operative course. He was AV paced. He was started on Coumadin for his mechanical valve. His PT and INR were monitored daily. His last INR was 2.20. He will be given Coumadin 2.5 today and 5 mg (or as directed) thereafter. He was volume over loaded and diuresed. He had ABL anemia. He did not require a post op transfusion. His last H and H was up to 9.8 and 29.7. He was started on Trinsicon. He was weaned off the insulin drip. The patient's glucose remained well controlled. The patient's HGA1C pre op was 5.6.  The patient was felt surgically stable for transfer from the ICU to [facility] for further convalescence on 12/04/2014. Repeat urine culture was obtained. Results are pending and I will follow up on this.He continues to progress with cardiac rehab. He was ambulating on room air. He has been tolerating a diet and has had a bowel movement. Epicardial pacing wires and chest tube sutures have been removed. Per Dr. Donata Clay, the patient is felt surgically stable for discharge today.   Recent vital signs:  Filed Vitals:   12/08/14 1300  BP: 109/59  Pulse: 80  Temp:   Resp: 18    Recent laboratory studies:  CBC:  Recent Labs   12/06/14 0250 12/07/14 0353  WBC 10.6* 12.7*  HGB 9.1* 9.8*  HCT 27.4* 29.7*  PLT 270 310   BMET:   Recent Labs  12/06/14 0250 12/07/14 0353  NA 139 139  K 3.3* 3.9  CL 105 104  CO2 25 27  GLUCOSE 119* 102*  BUN 14 15  CREATININE 0.70 0.68  CALCIUM 8.8* 9.1    PT/INR:   Recent Labs  12/08/14 0349  LABPROT 24.3*  INR 2.20*     Discharge Medications:     Medication List    STOP taking these medications        furosemide 40 MG tablet  Commonly known as:  LASIX     lisinopril 5 MG tablet  Commonly known as:  PRINIVIL,ZESTRIL     spironolactone 25 MG tablet  Commonly known as:  ALDACTONE      TAKE these medications        aspirin 81 MG EC tablet  Take 1 tablet (81 mg total) by mouth daily.  atorvastatin 80 MG tablet  Commonly known as:  LIPITOR  Take 80 mg by mouth daily at 6 PM.     atorvastatin 40 MG tablet  Commonly known as:  LIPITOR  Take 1 tablet (40 mg total) by mouth daily.     ferrous fumarate-b12-vitamic C-folic acid capsule  Commonly known as:  TRINSICON / FOLTRIN  Take 1 capsule by mouth daily. For one month then stop.     metoprolol tartrate 25 MG tablet  Commonly known as:  LOPRESSOR  Take 0.5 tablets (12.5 mg total) by mouth 2 (two) times daily.     oxyCODONE 5 MG immediate release tablet  Commonly known as:  Oxy IR/ROXICODONE  Take 1-2 tablets (5-10 mg total) by mouth every 4 (four) hours as needed for severe pain.     warfarin 5 MG tablet  Commonly known as:  COUMADIN  Take 1 tablet (5 mg total) by mouth daily at 6 PM. Or as directed  Start taking on:  12/09/2014        Discharge Instructions:  The patient is to refrain from driving, heavy lifting or strenuous activity.  May shower daily and clean incisions with soap and water.  May resume regular diet.   Follow Up:    Follow-up Information    Follow up with [facility].   Why:  Please call after discharge for set up with Primary Care  Provider and Medication Assitance   Contact information:   [address] [place], [place] [address] Phone Numbers .Phone: [phone] .Pharmacy: [phone] .After Hours: [phone]       Follow up with [facility] On 12/16/2014.   Why:  at 2:30 for pacemaker check   Contact information:   [address] [place] [address]       Follow up with Home Health.   Why:  Please draw PT and INR  (as is on Coumadin for a mechanical valve ) on Thursday 12/10/2014.  Call or fax results to Dr. Odessa Fleming office      Follow up with Kathlee Nations Suann Larry, MD On 01/13/2015.   Specialty:  Cardiothoracic Surgery   Why:  PA/LAT CXR to be taken (at [facility] which is in the same building as Dr. Zenaida Niece Trigt's office) on 01/13/2015 at 3:45 pm;Appointment time is at 4:30 pm   Contact information:   [address] [employer] [address] [place] [address] [phone]        Elenore Rota 12/08/2014, 3:04 PM

## 2014-12-04 NOTE — Progress Notes (Signed)
3 Days Post-Op Procedure(s) (LRB): AORTIC VALVE REPLACEMENT (AVR) (N/A) TRANSESOPHAGEAL ECHOCARDIOGRAM (TEE) (N/A) TEMPORARY VENTRICULAR PACING WIRE INSERTION PLACEMENT OF PERMANENT BI-VENTRICULAR PACEMAKER (CRT-P) WITH EPICARDIAL PACING LEADS Subjective: Less pain today CT with min output- will DC PPM working well  Coumadin started for mech AVR Postop anemia on PO Iron  Objective: Vital signs in last 24 hours: Temp:  [96.8 F (36 C)-100 F (37.8 C)] 98.4 F (36.9 C) (07/08 0718) Pulse Rate:  [67-92] 89 (07/08 0651) Cardiac Rhythm:  [-] Ventricular paced (07/08 0400) Resp:  [9-29] 19 (07/08 0651) BP: (75-110)/(39-64) 110/64 mmHg (07/08 0600) SpO2:  [88 %-99 %] 91 % (07/08 0651) Weight:  [218 lb 4.1 oz (99 kg)] 218 lb 4.1 oz (99 kg) (07/08 0500)  Hemodynamic parameters for last 24 hours:   stable Intake/Output from previous day: 07/07 0701 - 07/08 0700 In: 1430 [P.O.:870; I.V.:210; IV Piggyback:350] Out: 2700 [Urine:2350; Chest Tube:350] Intake/Output this shift:    Soft flow murmur extrem warm  Lab Results:  Recent Labs  12/03/14 1830 12/03/14 1837 12/04/14 0215  WBC 16.8*  --  11.8*  HGB 8.6* 9.2* 7.1*  HCT 25.8* 27.0* 21.4*  PLT 170  --  154   BMET:  Recent Labs  12/03/14 0515 12/03/14 1837 12/04/14 0215  NA 133* 134* 134*  K 4.0 4.3 4.0  CL 100* 96* 99*  CO2 25  --  28  GLUCOSE 99 95 87  BUN 12 21* 16  CREATININE 0.66 1.10 0.73  CALCIUM 8.0*  --  8.2*    PT/INR:  Recent Labs  12/04/14 0215  LABPROT 18.6*  INR 1.55*   ABG    Component Value Date/Time   PHART 7.415 12/02/2014 0535   HCO3 23.2 12/02/2014 0535   TCO2 27 12/03/2014 1837   ACIDBASEDEF 1.0 12/02/2014 0535   O2SAT 90.0 12/02/2014 0535   CBG (last 3)   Recent Labs  12/03/14 1930 12/03/14 2349 12/04/14 0350  GLUCAP 102* 114* 84    Assessment/Plan: S/P Procedure(s) (LRB): AORTIC VALVE REPLACEMENT (AVR) (N/A) TRANSESOPHAGEAL ECHOCARDIOGRAM (TEE) (N/A) TEMPORARY  VENTRICULAR PACING WIRE INSERTION PLACEMENT OF PERMANENT BI-VENTRICULAR PACEMAKER (CRT-P) WITH EPICARDIAL PACING LEADS Mobilize Diuresis Plan for transfer to step-down: see transfer orders DC EPWs tomorrow on 2 west  LOS: 13 days    Nicholas Horton 12/04/2014

## 2014-12-04 NOTE — Progress Notes (Signed)
CARDIAC REHAB PHASE I   PRE:  Rate/Rhythm: 77 pacing at times    BP: sitting 112/68    SaO2: 96 6L  MODE:  Ambulation: 500 ft   POST:  Rate/Rhythm: 88 pacing constant    BP: sitting 107/56     SaO2: 94 6L  Pt lying in bed having strange pacing pattern, spikes moving gradually through QRS over 4-5 bts then no spikes for many beats. This all resolved when he sat up. Pacing every beat entire walk, no problems. Pt SOB with sitting up and walking, SaO2 good on 6L. To recliner after walk. Will continue to follow. Requests his referral be [facility] to [facility].  [phone]  Harriet Masson CES, ACSM 12/04/2014 2:55 PM

## 2014-12-04 NOTE — Discharge Instructions (Addendum)
MOUTH CARE AFTER SURGERY ° °FACTS: °· Ice used in ice bag helps keep the swelling down, and can help lessen the pain. °· It is easier to treat pain BEFORE it happens. °· Spitting disturbs the clot and may cause bleeding to start again, or to get worse. °· Smoking delays healing and can cause complications. °· Sharing prescriptions can be dangerous.  Do not take medications not recently prescribed for you. °· Antibiotics may stop birth control pills from working.  Use other means of birth control while on antibiotics. °· Warm salt water rinses after the first 24 hours will help lessen the swelling:  Use 1/2 teaspoonful of table salt per oz.of water. ° °DO NOT: °· Do not spit.  Do not drink through a straw. °· Strongly advised not to smoke, dip snuff or chew tobacco at least for 3 days. °· Do not eat sharp or crunchy foods.  Avoid the area of surgery when chewing. °· Do not stop your antibiotics before your instructions say to do so. °· Do not eat hot foods until bleeding has stopped.  If you need to, let your food cool down to room temperature. ° °EXPECT: °· Some swelling, especially first 2-3 days. °· Soreness or discomfort in varying degrees.  Follow your dentist's instructions about how to handle pain before it starts. °· Pinkish saliva or light blood in saliva, or on your pillow in the morning.  This can last around 24 hours. °· Bruising inside or outside the mouth.  This may not show up until 2-3 days after surgery.  Don't worry, it will go away in time. °· Pieces of "bone" may work themselves loose.  It's OK.  If they bother you, let us know. ° °WHAT TO DO IMMEDIATELY AFTER SURGERY: °· Bite on the gauze with steady pressure for 1-2 hours.  Don't chew on the gauze. °· Do not lie down flat.  Raise your head support especially for the first 24 hours. °· Apply ice to your face on the side of the surgery.  You may apply it 20 minutes on and a few minutes off.  Ice for 8-12 hours.  You may use ice up to 24  hours. °· Before the numbness wears off, take a pain pill as instructed. °· Prescription pain medication is not always required. ° °SWELLING: °· Expect swelling for the first couple of days.  It should get better after that. °· If swelling increases 3 days or so after surgery; let us know as soon as possible. ° °FEVER: °· Take Tylenol every 4 hours if needed to lower your temperature, especially if it is at 100F or higher. °· Drink lots of fluids. °· If the fever does not go away, let us know. ° °BREATHING TROUBLE: °· Any unusual difficulty breathing means you have to have someone bring you to the emergency room ASAP ° °BLEEDING: °· Light oozing is expected for 24 hours or so. °· Prop head up with pillows °· Avoid spitting °· Do not confuse bright red fresh flowing blood with lots of saliva colored with a little bit of blood. °· If you notice some bleeding, place gauze or a tea bag where it is bleeding and apply CONSTANT pressure by biting down for 1 hour.  Avoid talking during this time.  Do not remove the gauze or tea bag during this hour to "check" the bleeding. °· If you notice bright RED bleeding FLOWING out of particular area, and filling the floor of your mouth, put   a wad of gauze on that area, bite down firmly and constantly.  Call us immediately.  If we're closed, have someone bring you to the emergency room.  ORAL HYGIENE:  Brush your teeth as usual after meals and before bedtime.  Use a soft toothbrush around the area of surgery.  DO NOT AVOID BRUSHING.  Otherwise bacteria(germs) will grow and may delay healing or encourage infection.  Since you cannot spit, just gently rinse and let the water flow out of your mouth.  DO NOT SWISH HARD.  EATING:  Cool liquids are a good point to start.  Increase to soft foods as tolerated.  PRESCRIPTIONS:  Follow the directions for your prescriptions exactly as written.  If Dr. Kristin Bruins gave you a narcotic pain medication, do not drive, operate  machinery or drink alcohol when on that medication.  QUESTIONS:  Call our office during office hours [phone] or call the Emergency Room at [phone].  Information on my medicine - Coumadin   (Warfarin)  This medication education was reviewed with me or my healthcare representative as part of my discharge preparation.  The pharmacist that spoke with me during my hospital stay was:  Armandina Stammer, [facility]  Why was Coumadin prescribed for you? Coumadin was prescribed for you because you have a blood clot or a medical condition that can cause an increased risk of forming blood clots. Blood clots can cause serious health problems by blocking the flow of blood to the heart, lung, or brain. Coumadin can prevent harmful blood clots from forming. As a reminder your indication for Coumadin is:   Blood Clot Prevention After Heart Valve Surgery  What test will check on my response to Coumadin? While on Coumadin (warfarin) you will need to have an INR test regularly to ensure that your dose is keeping you in the desired range. The INR (international normalized ratio) number is calculated from the result of the laboratory test called prothrombin time (PT).  If an INR APPOINTMENT HAS NOT ALREADY BEEN MADE FOR YOU please schedule an appointment to have this lab work done by your health care provider within 7 days. Your INR goal is usually a number between:  2 to 3 or your provider may give you a more narrow range like 2-2.5.  Ask your health care provider during an office visit what your goal INR is.  What  do you need to  know  About  COUMADIN? Take Coumadin (warfarin) exactly as prescribed by your healthcare provider about the same time each day.  DO NOT stop taking without talking to the doctor who prescribed the medication.  Stopping without other blood clot prevention medication to take the place of Coumadin may increase your risk of developing a new clot or stroke.  Get refills before you run  out.  What do you do if you miss a dose? If you miss a dose, take it as soon as you remember on the same day then continue your regularly scheduled regimen the next day.  Do not take two doses of Coumadin at the same time.  Important Safety Information A possible side effect of Coumadin (Warfarin) is an increased risk of bleeding. You should call your healthcare provider right away if you experience any of the following: ? Bleeding from an injury or your nose that does not stop. ? Unusual colored urine (red or dark brown) or unusual colored stools (red or black). ? Unusual bruising for unknown reasons. ? A serious fall or if you hit  your head (even if there is no bleeding).  Some foods or medicines interact with Coumadin (warfarin) and might alter your response to warfarin. To help avoid this: ? Eat a balanced diet, maintaining a consistent amount of Vitamin K. ? Notify your provider about major diet changes you plan to make. ? Avoid alcohol or limit your intake to 1 drink for women and 2 drinks for men per day. (1 drink is 5 oz. wine, 12 oz. beer, or 1.5 oz. liquor.)  Make sure that ANY health care provider who prescribes medication for you knows that you are taking Coumadin (warfarin).  Also make sure the healthcare provider who is monitoring your Coumadin knows when you have started a new medication including herbals and non-prescription products.  Coumadin (Warfarin)  Major Drug Interactions  Increased Warfarin Effect Decreased Warfarin Effect  Alcohol (large quantities) Antibiotics (esp. Septra/Bactrim, Flagyl, Cipro) Amiodarone (Cordarone) Aspirin (ASA) Cimetidine (Tagamet) Megestrol (Megace) NSAIDs (ibuprofen, naproxen, etc.) Piroxicam (Feldene) Propafenone (Rythmol SR) Propranolol (Inderal) Isoniazid (INH) Posaconazole (Noxafil) Barbiturates (Phenobarbital) Carbamazepine (Tegretol) Chlordiazepoxide (Librium) Cholestyramine (Questran) Griseofulvin Oral  Contraceptives Rifampin Sucralfate (Carafate) Vitamin K   Coumadin (Warfarin) Major Herbal Interactions  Increased Warfarin Effect Decreased Warfarin Effect  Garlic Ginseng Ginkgo biloba Coenzyme Q10 Green tea St. Johns wort    Coumadin (Warfarin) FOOD Interactions  Eat a consistent number of servings per week of foods HIGH in Vitamin K (1 serving =  cup)  Collards (cooked, or boiled & drained) Kale (cooked, or boiled & drained) Mustard greens (cooked, or boiled & drained) Parsley *serving size only =  cup Spinach (cooked, or boiled & drained) Swiss chard (cooked, or boiled & drained) Turnip greens (cooked, or boiled & drained)  Eat a consistent number of servings per week of foods MEDIUM-HIGH in Vitamin K (1 serving = 1 cup)  Asparagus (cooked, or boiled & drained) Broccoli (cooked, boiled & drained, or raw & chopped) Brussel sprouts (cooked, or boiled & drained) *serving size only =  cup Lettuce, raw (green leaf, endive, romaine) Spinach, raw Turnip greens, raw & chopped   These websites have more information on Coumadin (warfarin):  http://www.king-russell.com/; https://www.hines.net/;   Aortic Valve Replacement, Care After Refer to this sheet in the next few weeks. These instructions provide you with information on caring for yourself after your procedure. Your health care provider may also give you specific instructions. Your treatment has been planned according to current medical practices, but problems sometimes occur. Call your health care provider if you have any problems or questions after your procedure. HOME CARE INSTRUCTIONS   Take medicines only as directed by your health care provider.  If your health care provider has prescribed elastic stockings, wear them as directed.  Take frequent naps or rest often throughout the day.  Avoid lifting over 10 lbs (4.5 kg) or pushing or pulling things with your arms for 6-8 weeks or as directed by your health care  provider.  Avoid driving or airplane travel for 4-6 weeks after surgery or as directed by your health care provider. If you are riding in a car for an extended period, stop every 1-2 hours to stretch your legs. Keep a record of your medicines and medical history with you when traveling.  Do not drive or operate heavy machinery while taking pain medicine. (narcotics).  Do not cross your legs.  Do not use any tobacco products including cigarettes, chewing tobacco, or electronic cigarettes. If you need help quitting, ask your health care provider.  Do not take  baths, swim, or use a hot tub until your health care provider approves. Take showers once your health care provider approves. Pat incisions dry. Do not rub incisions with a washcloth or towel.  Avoid climbing stairs and using the handrail to pull yourself up for the first 2-3 weeks after surgery.  Return to work as directed by your health care provider.  Drink enough fluid to keep your urine clear or pale yellow.  Do not strain to have a bowel movement. Eat high-fiber foods if you become constipated. You may also take a medicine to help you have a bowel movement (laxative) as directed by your health care provider.  Resume sexual activity as directed by your health care provider. Men should not use medicines for erectile dysfunction until their doctor says it isokay.  If you had a certain type of heart condition in the past, you may need to take antibiotic medicine before having dental work or surgery. Let your dentist and health care providers know if you had one or more of the following:  Previous endocarditis.  An artificial (prosthetic) heart valve.  Congenital heart disease. SEEK MEDICAL CARE IF:  You develop a skin rash.   You experience sudden changes in your weight.  You have a fever. SEEK IMMEDIATE MEDICAL CARE IF:   You develop chest pain that is not coming from your incision.  You have drainage (pus), redness,  swelling, or pain at your incision site.   You develop shortness of breath or have difficulty breathing.   You have increased bleeding from your incision site.   You develop light-headedness.  MAKE SURE YOU:   Understand these directions.  Will watch your condition.  Will get help right away if you are not doing well or get worse. Document Released: 12/01/2004 Document Revised: 09/29/2013 Document Reviewed: 02/27/2012 [facility] Patient Information 2015 [place], [place]. This information is not intended to replace advice given to you by your health care provider. Make sure you discuss any questions you have with your health care provider.

## 2014-12-05 ENCOUNTER — Inpatient Hospital Stay (HOSPITAL_COMMUNITY): Payer: Self-pay

## 2014-12-05 LAB — CBC
HCT: 25.5 % — ABNORMAL LOW (ref 39.0–52.0)
Hemoglobin: 8.4 g/dL — ABNORMAL LOW (ref 13.0–17.0)
MCH: 30 pg (ref 26.0–34.0)
MCHC: 32.9 g/dL (ref 30.0–36.0)
MCV: 91.1 fL (ref 78.0–100.0)
Platelets: 213 10*3/uL (ref 150–400)
RBC: 2.8 MIL/uL — ABNORMAL LOW (ref 4.22–5.81)
RDW: 14.5 % (ref 11.5–15.5)
WBC: 10.1 10*3/uL (ref 4.0–10.5)

## 2014-12-05 LAB — TYPE AND SCREEN
ABO/RH(D): A NEG
Antibody Screen: NEGATIVE
Unit division: 0

## 2014-12-05 LAB — BASIC METABOLIC PANEL
Anion gap: 8 (ref 5–15)
BUN: 14 mg/dL (ref 6–20)
CO2: 27 mmol/L (ref 22–32)
Calcium: 8.3 mg/dL — ABNORMAL LOW (ref 8.9–10.3)
Chloride: 102 mmol/L (ref 101–111)
Creatinine, Ser: 0.65 mg/dL (ref 0.61–1.24)
GFR calc Af Amer: >60 mL/min (ref >60)
GFR calc non Af Amer: >60 mL/min (ref >60)
Glucose, Bld: 97 mg/dL (ref 65–99)
Potassium: 3.6 mmol/L (ref 3.5–5.1)
Sodium: 137 mmol/L (ref 135–145)

## 2014-12-05 LAB — PROTIME-INR
INR: 1.41 (ref 0.00–1.49)
Prothrombin Time: 17.4 seconds — ABNORMAL HIGH (ref 11.6–15.2)

## 2014-12-05 NOTE — Progress Notes (Signed)
Removed epicardial wires per order. 3 intact. Met resistance on right wires and PA Gina Collins removed. Pt tolerated procedure well.  Pt instructed to remain on bedrest for one hour.  Frequent vitals will be taken and documented. Pt resting with call bell within reach. Payton Emerald, RN

## 2014-12-05 NOTE — Progress Notes (Signed)
CARDIAC REHAB PHASE I   PRE:  Rate/Rhythm: vpaced 84  BP:   Sitting: 116/60     SaO2: 97% 5l/min  MODE:  Ambulation: 550 ft   POST:  Rate/Rhythem: v paced 87  BP:    Sitting: 116/63     SaO2: 96%4l/min  1400-1450  Patient ambulated in the hallway with rolling walker. Patient tolerated ambulation without difficulty or shortness of breath. Mr Stuckert's oxygen saturation stayed above 95% on 5 liters during ambulation. Patient assisted back to the recliner with call light within reach. Oxygen decreased to 4l/min. Patient encouraged to use his incentive spirometer every hour.  Fantasia Jinkins, Arta Bruce RN BSN

## 2014-12-05 NOTE — Progress Notes (Signed)
Patient states he feels too tired to walk again tonight, he states he will attempt tomorrow morning.Patient resting comfortably, call light within reach.

## 2014-12-05 NOTE — Progress Notes (Addendum)
       [address]       [place] [address]             [phone]          4 Days Post-Op Procedure(s) (LRB): AORTIC VALVE REPLACEMENT (AVR) (N/A) TRANSESOPHAGEAL ECHOCARDIOGRAM (TEE) (N/A) TEMPORARY VENTRICULAR PACING WIRE INSERTION PLACEMENT OF PERMANENT BI-VENTRICULAR PACEMAKER (CRT-P) WITH EPICARDIAL PACING LEADS  Subjective: Sore, not eating well. Breathing stable but hurts to take a deep breath.  Objective: Vital signs in last 24 hours: Patient Vitals for the past 24 hrs:  BP Temp Temp src Pulse Resp SpO2 Weight  12/05/14 0853 116/65 mmHg - - 76 - - -  12/05/14 0506 (!) 108/52 mmHg 97.9 F (36.6 C) Oral 95 - 94 % 213 lb 1.6 oz (96.662 kg)  12/04/14 2000 119/68 mmHg 98.3 F (36.8 C) Oral 79 18 94 % -  12/04/14 1100 (!) 109/56 mmHg 98.1 F (36.7 C) Oral 83 18 99 % -  12/04/14 1000 114/60 mmHg - - 83 - - -  12/04/14 0925 105/61 mmHg 99.1 F (37.3 C) Oral 88 17 93 % -  12/04/14 0910 112/66 mmHg - - 90 18 93 % -  12/04/14 0900 112/66 mmHg - - 92 (!) 22 93 % -   Current Weight  12/05/14 213 lb 1.6 oz (96.662 kg)  PRE-OPERATIVE WEIGHT: 95kg   Intake/Output from previous day: 07/08 0701 - 07/09 0700 In: 539 [P.O.:240; I.V.:20; Blood:279] Out: 1275 [Urine:1275]    PHYSICAL EXAM:  Heart: RRR, paced. Good valve click. Lungs: Decreased BS in bases Wound: Clean and dry Extremities: Minimal edema    Lab Results: CBC: Recent Labs  12/04/14 0215 12/05/14 0257  WBC 11.8* 10.1  HGB 7.1* 8.4*  HCT 21.4* 25.5*  PLT 154 213   BMET:  Recent Labs  12/04/14 0215 12/05/14 0257  NA 134* 137  K 4.0 3.6  CL 99* 102  CO2 28 27  GLUCOSE 87 97  BUN 16 14  CREATININE 0.73 0.65  CALCIUM 8.2* 8.3*    PT/INR:  Recent Labs  12/05/14 0257  LABPROT 17.4*  INR 1.41      Assessment/Plan: S/P Procedure(s) (LRB): AORTIC VALVE REPLACEMENT (AVR) (N/A) TRANSESOPHAGEAL ECHOCARDIOGRAM (TEE) (N/A) TEMPORARY VENTRICULAR PACING WIRE  INSERTION PLACEMENT OF PERMANENT BI-VENTRICULAR PACEMAKER (CRT-P) WITH EPICARDIAL PACING LEADS  CV- pacing appropriately.  BPs stable. Continue low dose beta blocker. Coumadin loading for mechanical valve.  Vol overload- continue diuresis.  Expected postop blood loss anemia- Horton/Horton generally stable, trending up.   Pulm- still on 5-6 L O2.  Poor respiratory effort.  Encouraged increased pulm toilet, IS,will order flutter valve.  Wean as able.  CRPI.   LOS: 14 days    Nicholas Horton,Nicholas Horton 12/05/2014  Still on o2, getting coumadin, inr 1.4 .I have seen and examined Nicholas Horton and agree with the above assessment  and plan.  Delight Ovens MD Beeper [phone] Office [phone] 12/05/2014 11:59 AM

## 2014-12-05 NOTE — Progress Notes (Signed)
Patient did not want to ambulate any this morning.  Patient stated he has been getting up to bathroom and chair.  I encouraged and patient will walk later. Pt resting with call bell within reach.  Will continue to monitor. Thomas Hoff, RN

## 2014-12-05 NOTE — Progress Notes (Signed)
Pt ambulated in hallway approximately 300 feet.  Pt ambulated with rolling walker and oxygen at 5 liters. Patient tolerated well. Pt resting with call bell within reach.  Will continue to monitor. Thomas Hoff, RN

## 2014-12-06 LAB — BASIC METABOLIC PANEL
Anion gap: 9 (ref 5–15)
BUN: 14 mg/dL (ref 6–20)
CHLORIDE: 105 mmol/L (ref 101–111)
CO2: 25 mmol/L (ref 22–32)
Calcium: 8.8 mg/dL — ABNORMAL LOW (ref 8.9–10.3)
Creatinine, Ser: 0.7 mg/dL (ref 0.61–1.24)
GFR calc Af Amer: >60 mL/min (ref >60)
GFR calc non Af Amer: >60 mL/min (ref >60)
GLUCOSE: 119 mg/dL — AB (ref 65–99)
POTASSIUM: 3.3 mmol/L — AB (ref 3.5–5.1)
Sodium: 139 mmol/L (ref 135–145)

## 2014-12-06 LAB — CBC
HEMATOCRIT: 27.4 % — AB (ref 39.0–52.0)
Hemoglobin: 9.1 g/dL — ABNORMAL LOW (ref 13.0–17.0)
MCH: 31.1 pg (ref 26.0–34.0)
MCHC: 33.2 g/dL (ref 30.0–36.0)
MCV: 93.5 fL (ref 78.0–100.0)
PLATELETS: 270 10*3/uL (ref 150–400)
RBC: 2.93 MIL/uL — ABNORMAL LOW (ref 4.22–5.81)
RDW: 14.2 % (ref 11.5–15.5)
WBC: 10.6 10*3/uL — ABNORMAL HIGH (ref 4.0–10.5)

## 2014-12-06 LAB — PROTIME-INR
INR: 1.53 — ABNORMAL HIGH (ref 0.00–1.49)
Prothrombin Time: 18.5 seconds — ABNORMAL HIGH (ref 11.6–15.2)

## 2014-12-06 MED ORDER — WARFARIN SODIUM 5 MG PO TABS
5.0000 mg | ORAL_TABLET | Freq: Every day | ORAL | Status: DC
  Administered 2014-12-06 – 2014-12-07 (×2): 5 mg via ORAL
  Filled 2014-12-06 (×3): qty 1

## 2014-12-06 MED ORDER — GUAIFENESIN ER 600 MG PO TB12
600.0000 mg | ORAL_TABLET | Freq: Two times a day (BID) | ORAL | Status: DC
  Administered 2014-12-06 – 2014-12-08 (×5): 600 mg via ORAL
  Filled 2014-12-06 (×6): qty 1

## 2014-12-06 MED ORDER — POTASSIUM CHLORIDE CRYS ER 20 MEQ PO TBCR
40.0000 meq | EXTENDED_RELEASE_TABLET | Freq: Every day | ORAL | Status: DC
  Administered 2014-12-06: 40 meq via ORAL
  Filled 2014-12-06 (×2): qty 2

## 2014-12-06 NOTE — Progress Notes (Addendum)
[address]       [place] [address]             [phone]          5 Days Post-Op Procedure(s) (LRB): AORTIC VALVE REPLACEMENT (AVR) (N/A) TRANSESOPHAGEAL ECHOCARDIOGRAM (TEE) (N/A) TEMPORARY VENTRICULAR PACING WIRE INSERTION PLACEMENT OF PERMANENT BI-VENTRICULAR PACEMAKER (CRT-P) WITH EPICARDIAL PACING LEADS  Subjective: Comfortable, no complaints. Some cough, still on 4L.    Objective: Vital signs in last 24 hours: Patient Vitals for the past 24 hrs:  BP Temp Temp src Pulse Resp SpO2 Weight  12/06/14 0557 113/66 mmHg 98 F (36.7 C) Oral - 18 96 % -  12/06/14 0500 - - - - - - 209 lb 6.4 oz (94.983 kg)  12/05/14 2101 120/63 mmHg 98.3 F (36.8 C) Oral 78 18 95 % -  12/05/14 1500 116/63 mmHg - - 84 18 95 % -  12/05/14 1115 116/71 mmHg - - 85 - - -  12/05/14 1030 114/70 mmHg - - - - - -  12/05/14 1000 111/70 mmHg - - - - - -   Current Weight  12/06/14 209 lb 6.4 oz (94.983 kg)  PRE-OPERATIVE WEIGHT: 95kg   Intake/Output from previous day: 07/09 0701 - 07/10 0700 In: 480 [P.O.:480] Out: 1330 [Urine:1330]    PHYSICAL EXAM:  Heart: RRR, good valve click Lungs: Coarse BS, poor inspiratory effort Wound: Clean and dry Extremities: Mild edema    Lab Results: CBC: Recent Labs  12/05/14 0257 12/06/14 0250  WBC 10.1 10.6*  HGB 8.4* 9.1*  HCT 25.5* 27.4*  PLT 213 270   BMET:  Recent Labs  12/05/14 0257 12/06/14 0250  NA 137 139  K 3.6 3.3*  CL 102 105  CO2 27 25  GLUCOSE 97 119*  BUN 14 14  CREATININE 0.65 0.70  CALCIUM 8.3* 8.8*    PT/INR:  Recent Labs  12/06/14 0250  LABPROT 18.5*  INR 1.53*   Dg Chest 2 View  12/05/2014   CLINICAL DATA:  Postop from aortic valve replacement for aortic stenosis.  EXAM: CHEST  2 VIEW  COMPARISON:  12/04/2014  FINDINGS: Mediastinal drain and left jugular Cordis have been removed since previous study. No pneumothorax visualized.  Persistent infiltrate or atelectasis is again seen in  the left lower lobe and lingula, without significant change. Right lung remains clear. Cardiomegaly stable. Pacemaker with epicardial leads remains in place.  IMPRESSION: Lingular and left lower lobe atelectasis or infiltrate, without significant change. No pneumothorax visualized.   Electronically Signed   By: Myles Rosenthal M.D.   On: 12/05/2014 11:18    Assessment/Plan: S/P Procedure(s) (LRB): AORTIC VALVE REPLACEMENT (AVR) (N/A) TRANSESOPHAGEAL ECHOCARDIOGRAM (TEE) (N/A) TEMPORARY VENTRICULAR PACING WIRE INSERTION PLACEMENT OF PERMANENT BI-VENTRICULAR PACEMAKER (CRT-P) WITH EPICARDIAL PACING LEADS  CV- pacing appropriately. BPs stable. Continue low dose beta blocker. Coumadin loading for mechanical valve.  Vol overload- continue diuresis.  Expected postop blood loss anemia- H/H generally stable, trending up.   Hypokalemia- replace K+.  Pulm- still on 4L O2. Poor respiratory effort. Encouraged increased pulm toilet, IS,will order flutter valve. Wean as able. LLL atelectasis on CXR.  CRPI.   LOS: 15 days    COLLINS,GINA H 12/06/2014  inr up to 1.53,  Increase dose to 5 mg  Follow up chest xray in am I have seen and examined Casimer Bilis and agree with the above assessment  and plan.  Delight Ovens MD Beeper [phone] Office [phone]  12/06/2014 10:54 AM

## 2014-12-06 NOTE — Progress Notes (Signed)
Pt ambulated in hallway with rolling walker on 4 liters oxygen approximately 500 feet times two.  Tolerated well without any issues. Pt returned room with oxygen 96% on 4 liters. Oxygen reduced to 3 liters nasal canula.  Pt oxygen saturation currently 95% on 3 liters.  Pt using incentive spirometer and flutter valve alternately. Pt continues to cough and produces thin mucous. Pt resting with call bell within reach.  Will continue to monitor. Thomas Hoff, RN

## 2014-12-07 LAB — BASIC METABOLIC PANEL
ANION GAP: 8 (ref 5–15)
BUN: 15 mg/dL (ref 6–20)
CALCIUM: 9.1 mg/dL (ref 8.9–10.3)
CO2: 27 mmol/L (ref 22–32)
Chloride: 104 mmol/L (ref 101–111)
Creatinine, Ser: 0.68 mg/dL (ref 0.61–1.24)
GFR calc Af Amer: >60 mL/min (ref >60)
GLUCOSE: 102 mg/dL — AB (ref 65–99)
Potassium: 3.9 mmol/L (ref 3.5–5.1)
SODIUM: 139 mmol/L (ref 135–145)

## 2014-12-07 LAB — CBC
HCT: 29.7 % — ABNORMAL LOW (ref 39.0–52.0)
Hemoglobin: 9.8 g/dL — ABNORMAL LOW (ref 13.0–17.0)
MCH: 30.7 pg (ref 26.0–34.0)
MCHC: 33 g/dL (ref 30.0–36.0)
MCV: 93.1 fL (ref 78.0–100.0)
Platelets: 310 10*3/uL (ref 150–400)
RBC: 3.19 MIL/uL — ABNORMAL LOW (ref 4.22–5.81)
RDW: 13.8 % (ref 11.5–15.5)
WBC: 12.7 10*3/uL — ABNORMAL HIGH (ref 4.0–10.5)

## 2014-12-07 LAB — PROTIME-INR
INR: 1.65 — ABNORMAL HIGH (ref 0.00–1.49)
Prothrombin Time: 19.5 seconds — ABNORMAL HIGH (ref 11.6–15.2)

## 2014-12-07 MED ORDER — FUROSEMIDE 40 MG PO TABS
40.0000 mg | ORAL_TABLET | Freq: Once | ORAL | Status: AC
  Administered 2014-12-07: 40 mg via ORAL
  Filled 2014-12-07: qty 1

## 2014-12-07 MED ORDER — POTASSIUM CHLORIDE CRYS ER 20 MEQ PO TBCR: 20.0000 meq | EXTENDED_RELEASE_TABLET | Freq: Once | ORAL | Status: DC

## 2014-12-07 MED ORDER — POTASSIUM CHLORIDE CRYS ER 20 MEQ PO TBCR
40.0000 meq | EXTENDED_RELEASE_TABLET | Freq: Once | ORAL | Status: AC
  Administered 2014-12-07: 40 meq via ORAL

## 2014-12-07 NOTE — Progress Notes (Signed)
Patient weaned down to 2 L of oxygen saO2 97% at rest, patient has no complaints at this time. RN will continue to monitor.    Nikki Dom, RN

## 2014-12-07 NOTE — Progress Notes (Addendum)
      [address]       [place] [address]             [phone]        6 Days Post-Op Procedure(s) (LRB): AORTIC VALVE REPLACEMENT (AVR) (N/A) TRANSESOPHAGEAL ECHOCARDIOGRAM (TEE) (N/A) TEMPORARY VENTRICULAR PACING WIRE INSERTION PLACEMENT OF PERMANENT BI-VENTRICULAR PACEMAKER (CRT-P) WITH EPICARDIAL PACING LEADS  Subjective: Patient without complaints and wants to go home.  Objective: Vital signs in last 24 hours: Temp:  [98.1 F (36.7 C)-98.6 F (37 C)] 98.6 F (37 C) (07/11 0440) Pulse Rate:  [72-84] 84 (07/11 0440) Cardiac Rhythm:  [-] Ventricular paced (07/11 0110) BP: (114-125)/(69-75) 122/75 mmHg (07/11 0440) SpO2:  [95 %-100 %] 95 % (07/11 0440) Weight:  [208 lb 1.6 oz (94.394 kg)] 208 lb 1.6 oz (94.394 kg) (07/11 0440)  Pre op weight 95 kg Current Weight  12/07/14 208 lb 1.6 oz (94.394 kg)       Intake/Output from previous day: 07/10 0701 - 07/11 0700 In: 240 [P.O.:240] Out: 1255 [Urine:1255]   Physical Exam:  Cardiovascular: Paced Pulmonary: Clear to auscultation bilaterally; no rales, wheezes, or rhonchi. Abdomen: Soft, non tender, bowel sounds present. Extremities: No lower extremity edema. Wounds: Clean and dry.  No erythema or signs of infection.  Lab Results: CBC: Recent Labs  12/06/14 0250 12/07/14 0353  WBC 10.6* 12.7*  HGB 9.1* 9.8*  HCT 27.4* 29.7*  PLT 270 310   BMET:  Recent Labs  12/06/14 0250 12/07/14 0353  NA 139 139  K 3.3* 3.9  CL 105 104  CO2 25 27  GLUCOSE 119* 102*  BUN 14 15  CREATININE 0.70 0.68  CALCIUM 8.8* 9.1    PT/INR:  Lab Results  Component Value Date   INR 1.65* 12/07/2014   INR 1.53* 12/06/2014   INR 1.41 12/05/2014   ABG:  INR: Will add last result for INR, ABG once components are confirmed Will add last 4 CBG results once components are confirmed  Assessment/Plan:  1. CV - SR  . On Lopressor 12.5 mg bid and Coumadin. INR slightly increased from 1.53 to 1.65. 2.   Pulmonary - On 2 liters of oxygen via [place]. Wean as tolerates. Encourage incentive spirometer. 3. Volume Overload - On Lasix 40 mg daily. No LE edema and below pre op weight. Will stop after today's dose. 4.  Acute blood loss anemia - H and H stable at 9.8 and 29.7. Continue Trinsicon 5. Supplement potassium 6. WBC slightly increased from 10.6 to 12.4. Remains afebrile. No signs of wound infection and no GU complaints. Has atelectasis on last CXR. 7. Likely home in am but will discuss with surgeon  ZIMMERMAN,DONIELLE MPA-C 12/07/2014,7:53 AM  Home on coumadin 5 mg Need HHN to draw INR  July 14 UTI with resistant E Coli- recheck urine culture as he has new AVR patient examined and medical record reviewed,agree with above note. [name] III 12/07/2014

## 2014-12-07 NOTE — Progress Notes (Signed)
CARDIAC REHAB PHASE I   PRE:  Rate/Rhythm: 80 paced    BP: sitting 124/72    SaO2: 96 2L, 92 RA  MODE:  Ambulation: 890 ft   POST:  Rate/Rhythm: 96 pacing    BP: sitting 127/73     SaO2: 97 RA  Pt doing great. Walked without assist, increased distance, no O2 needed (and pt sts he slept without O2). Ed completed with good reception. Quit smoking in May and very determined to stay quit. He is thinking about [facility] and will call [facility] if he would like to do program. Set up d/c video and encouraged him to watch Coumadin video. Pharmacist to discuss Coumadin more. [phone]   Harriet Masson CES, ACSM 12/07/2014 9:17 AM

## 2014-12-08 ENCOUNTER — Encounter (HOSPITAL_COMMUNITY): Payer: Self-pay | Admitting: Cardiothoracic Surgery

## 2014-12-08 LAB — PROTIME-INR
INR: 2.2 — ABNORMAL HIGH (ref 0.00–1.49)
Prothrombin Time: 24.3 seconds — ABNORMAL HIGH (ref 11.6–15.2)

## 2014-12-08 MED ORDER — WARFARIN SODIUM 5 MG PO TABS: 5.0000 mg | ORAL_TABLET | Freq: Every day | ORAL | Status: DC

## 2014-12-08 MED ORDER — METOPROLOL TARTRATE 25 MG PO TABS: 12.5000 mg | ORAL_TABLET | Freq: Two times a day (BID) | ORAL | Status: DC

## 2014-12-08 MED ORDER — ATORVASTATIN CALCIUM 40 MG PO TABS: 40.0000 mg | ORAL_TABLET | Freq: Every day | ORAL | Status: DC

## 2014-12-08 MED ORDER — WARFARIN SODIUM 2.5 MG PO TABS
2.5000 mg | ORAL_TABLET | Freq: Every day | ORAL | Status: DC
  Administered 2014-12-08: 2.5 mg via ORAL
  Filled 2014-12-08: qty 1

## 2014-12-08 MED ORDER — OXYCODONE HCL 5 MG PO TABS: 5.0000 mg | ORAL_TABLET | ORAL | Status: DC | PRN

## 2014-12-08 MED ORDER — FE FUMARATE-B12-VIT C-FA-IFC PO CAPS: 1.0000 | ORAL_CAPSULE | Freq: Every day | ORAL | Status: DC

## 2014-12-08 NOTE — Care Management Note (Addendum)
Case Management Note Note started by Avie Arenas RNCM  Patient Details  Name: Nicholas Horton MRN: [id number] Date of Birth: 1976/05/26  Subjective/Objective:           Fiance, [name] in room, States will be with patient on discharge 24/7.  Post op AVR 7-5.  Remains on rebreather mask.  Sitting up in chair.  Nurse states physically did well getting to chair, but desats with any movement.  CM will continue to follow.     Action/Plan:   Expected Discharge Date:  12/08/14               Expected Discharge Plan:  Home/Self Care  In-House Referral:  Clinical Social Work  Discharge planning Services  CM Consult, Indigent Clinic  Post Acute Care Choice:    Choice offered to:     DME Arranged:    DME Agency:     HH Arranged:    HH Agency:     Status of Service:  Completed, signed off  Medicare Important Message Given:    Date Medicare IM Given:    Medicare IM give by:    Date Additional Medicare IM Given:    Additional Medicare Important Message give by:     If discussed at Long Length of Stay Meetings, dates discussed:  12/08/14  Additional Comments: CM reiterated the importance of pt contacting [facility] (information provided on AVS) to obtain PCP and medication assistance, CM provided discount coupons for Lipitor and Oxycodone, other medications listed on AVS can be purchased at [facility] for less than $6.  No additional CM needs.  12/08/14- pt for d/c home today- no CM noted.   Donn Pierini [place], [place]- [phone] 12/08/2014, 2:05 PM

## 2014-12-08 NOTE — Progress Notes (Signed)
Patient sutures removed, painted with benzoin, and steri strips applied. Patient tolerated well. Patient D/C education completed. Handout given. CM discussed medication assistance with patient and wife. IV removed. Tele box removed. Patient belongings given to wife. Patient taken to D/C with family.    Valinda Hoar RN

## 2014-12-08 NOTE — Progress Notes (Addendum)
CARDIAC REHAB PHASE I   PRE:  Rate/Rhythm:  V paced 84  BP:  Supine:   Sitting: 109/66  Standing:    SaO2: 93%RA  MODE:  Ambulation: 1130 ft   POST:  Rate/Rhythm: V paced 100  BP:  Supine:   Sitting: 120/74  Standing:    SaO2: 97%RA 1050-1115 Pt walked 1130 ft independently with steady gait. Tolerated well. To bed after walk. Stated he watched post op and Coumadin videos yesterday.   Luetta Nutting, RN BSN  12/08/2014 11:11 AM

## 2014-12-08 NOTE — Progress Notes (Addendum)
      [address]       [place] [address]             [phone]        7 Days Post-Op Procedure(s) (LRB): AORTIC VALVE REPLACEMENT (AVR) (N/A) TRANSESOPHAGEAL ECHOCARDIOGRAM (TEE) (N/A) TEMPORARY VENTRICULAR PACING WIRE INSERTION PLACEMENT OF PERMANENT BI-VENTRICULAR PACEMAKER (CRT-P) WITH EPICARDIAL PACING LEADS  Subjective: Patient sleeping. He was awakened. No complaints.  Objective: Vital signs in last 24 hours: Temp:  [98.3 F (36.8 C)-98.7 F (37.1 C)] 98.6 F (37 C) (07/12 0515) Pulse Rate:  [76-87] 76 (07/12 0515) Cardiac Rhythm:  [-] Ventricular paced (07/11 2054) Resp:  [18] 18 (07/12 0515) BP: (101-127)/(69-75) 101/75 mmHg (07/12 0515) SpO2:  [90 %-92 %] 92 % (07/12 0515) Weight:  [204 lb 2.3 oz (92.6 kg)] 204 lb 2.3 oz (92.6 kg) (07/12 0515)  Pre op weight 95 kg Current Weight  12/08/14 204 lb 2.3 oz (92.6 kg)       Intake/Output from previous day: 07/11 0701 - 07/12 0700 In: 243 [P.O.:240; I.V.:3] Out: 1775 [Urine:1775]   Physical Exam:  Cardiovascular: Paced, sharp valve click Pulmonary: Clear to auscultation bilaterally; no rales, wheezes, or rhonchi. Abdomen: Soft, non tender, bowel sounds present. Extremities: No lower extremity edema. Wounds: Clean and dry.  No erythema or signs of infection.  Lab Results: CBC:  Recent Labs  12/06/14 0250 12/07/14 0353  WBC 10.6* 12.7*  HGB 9.1* 9.8*  HCT 27.4* 29.7*  PLT 270 310   BMET:   Recent Labs  12/06/14 0250 12/07/14 0353  NA 139 139  K 3.3* 3.9  CL 105 104  CO2 25 27  GLUCOSE 119* 102*  BUN 14 15  CREATININE 0.70 0.68  CALCIUM 8.8* 9.1    PT/INR:  Lab Results  Component Value Date   INR 2.20* 12/08/2014   INR 1.65* 12/07/2014   INR 1.53* 12/06/2014   ABG:  INR: Will add last result for INR, ABG once components are confirmed Will add last 4 CBG results once components are confirmed  Assessment/Plan:  1. CV - SR  . On Lopressor 12.5 mg bid and  Coumadin. INR  increased from 1.65 to 2.20. Will give Coumadin 2.5 today and send on 5 mg. 2.  Pulmonary - On room air. Encourage incentive spirometer. 3.  Acute blood loss anemia - H and H stable at 9.8 and 29.7. Continue Trinsicon 4. Previous E Coli UTI. Await UC results 5. Likely home later today.  ZIMMERMAN,DONIELLE MPA-C 12/08/2014,7:30 AM   Ready for DC- can f/u urine culture as outpt patient examined and medical record reviewed,agree with above note. [name] III 12/08/2014

## 2014-12-09 LAB — URINE CULTURE
Culture: 6000
Special Requests: NORMAL

## 2014-12-11 NOTE — Progress Notes (Signed)
Received call from pt's SO [name] Beck regarding PT/INR check- it was placed on pt's  AVS that [facility] would check INR on 12/10/14 but no HH order was ever placed- however HH is not able to do one time INR check without some other HH services also in the home- call made to Dr. Odessa Fleming office to see if coumadin clinic could take him this afternoon- appointment made for 2:30 this afternoon at the [address] office- pt called to give appointment time, girlfriend also called and concerned about getting there- advised her to call Coumadin clinic to see if there was a later time available.

## 2014-12-11 NOTE — Addendum Note (Signed)
Encounter addended by: Candida Peeling, RN on: 12/11/2014  1:20 PM<BR>     Documentation filed: Notes Section

## 2014-12-11 NOTE — Progress Notes (Deleted)
Received call from pt's

## 2014-12-16 ENCOUNTER — Ambulatory Visit (INDEPENDENT_AMBULATORY_CARE_PROVIDER_SITE_OTHER): Payer: Self-pay | Admitting: *Deleted

## 2014-12-16 ENCOUNTER — Ambulatory Visit: Payer: Self-pay

## 2014-12-16 DIAGNOSIS — I442 Atrioventricular block, complete: Secondary | ICD-10-CM

## 2014-12-16 DIAGNOSIS — Z5181 Encounter for therapeutic drug level monitoring: Secondary | ICD-10-CM

## 2014-12-16 DIAGNOSIS — Z952 Presence of prosthetic heart valve: Secondary | ICD-10-CM

## 2014-12-16 DIAGNOSIS — Z954 Presence of other heart-valve replacement: Secondary | ICD-10-CM

## 2014-12-16 LAB — CUP PACEART INCLINIC DEVICE CHECK
Battery Remaining Longevity: 69 mo
Brady Statistic AS VP Percent: 93.85 %
Brady Statistic AS VS Percent: 0.96 %
Brady Statistic RA Percent Paced: 5.19 %
Lead Channel Impedance Value: 399 Ohm
Lead Channel Impedance Value: 4047 Ohm
Lead Channel Impedance Value: 4047 Ohm
Lead Channel Impedance Value: 513 Ohm
Lead Channel Pacing Threshold Amplitude: 1.5 V
Lead Channel Pacing Threshold Amplitude: 1.5 V
Lead Channel Pacing Threshold Pulse Width: 0.5 ms
Lead Channel Sensing Intrinsic Amplitude: 27.5 mV
Lead Channel Setting Pacing Amplitude: 3 V
Lead Channel Setting Pacing Amplitude: 3.5 V
Lead Channel Setting Pacing Amplitude: 3.75 V
Lead Channel Setting Pacing Pulse Width: 0.5 ms
Lead Channel Setting Sensing Sensitivity: 4 mV
MDC IDC MSMT BATTERY VOLTAGE: 3.04 V
MDC IDC MSMT LEADCHNL LV IMPEDANCE VALUE: 342 Ohm
MDC IDC MSMT LEADCHNL LV IMPEDANCE VALUE: 4047 Ohm
MDC IDC MSMT LEADCHNL LV IMPEDANCE VALUE: 4047 Ohm
MDC IDC MSMT LEADCHNL RA IMPEDANCE VALUE: 874 Ohm
MDC IDC MSMT LEADCHNL RA PACING THRESHOLD AMPLITUDE: 0.75 V
MDC IDC MSMT LEADCHNL RA PACING THRESHOLD PULSEWIDTH: 0.4 ms
MDC IDC MSMT LEADCHNL RA SENSING INTR AMPL: 4.5 mV
MDC IDC MSMT LEADCHNL RV IMPEDANCE VALUE: 4047 Ohm
MDC IDC MSMT LEADCHNL RV PACING THRESHOLD PULSEWIDTH: 0.4 ms
MDC IDC SESS DTM: 20160720122314
MDC IDC SET LEADCHNL RV PACING PULSEWIDTH: 0.5 ms
MDC IDC SET ZONE DETECTION INTERVAL: 400 ms
MDC IDC STAT BRADY AP VP PERCENT: 5.16 %
MDC IDC STAT BRADY AP VS PERCENT: 0.02 %
MDC IDC STAT BRADY RV PERCENT PACED: 99.01 %
Zone Setting Detection Interval: 400 ms

## 2014-12-16 LAB — POCT INR: INR: 4

## 2014-12-16 NOTE — Progress Notes (Signed)

## 2014-12-16 NOTE — Progress Notes (Signed)
Wound check appointment. Epicardial CRTP placement 12/01/14. Wound without redness or edema. Incision edges approximated, wound well healed. Suture clipped from medial edge of incision. Normal device function. Thresholds, sensing, and impedances consistent with implant measurements. Device programmed at 3.5, 3.75, 3 V for extra safety margin until 3 month visit. Histogram distribution appropriate for patient and level of activity. No mode switches or high ventricular rates noted. Patient educated about wound care, arm mobility, lifting restrictions. ROV in 3 months with Dr. Graciela Husbands due to patient preference to be seen in [place].

## 2014-12-16 NOTE — Patient Instructions (Signed)

## 2014-12-21 ENCOUNTER — Telehealth: Payer: Self-pay

## 2014-12-21 NOTE — Telephone Encounter (Signed)
Request from Disabilty Determination Services , sent to [employer] on 12/22/2014 .  

## 2014-12-22 ENCOUNTER — Other Ambulatory Visit: Payer: Self-pay

## 2014-12-22 DIAGNOSIS — G8918 Other acute postprocedural pain: Secondary | ICD-10-CM

## 2014-12-22 MED ORDER — OXYCODONE HCL 5 MG PO TABS: 5.0000 mg | ORAL_TABLET | Freq: Four times a day (QID) | ORAL | Status: DC | PRN

## 2014-12-22 NOTE — Telephone Encounter (Signed)
RX refill for oxycodone 5 mg #40, no refills given. Patient will pick up at front desk.

## 2014-12-23 ENCOUNTER — Ambulatory Visit (INDEPENDENT_AMBULATORY_CARE_PROVIDER_SITE_OTHER): Payer: Self-pay

## 2014-12-23 ENCOUNTER — Telehealth: Payer: Self-pay | Admitting: Cardiology

## 2014-12-23 DIAGNOSIS — Z952 Presence of prosthetic heart valve: Secondary | ICD-10-CM

## 2014-12-23 DIAGNOSIS — Z954 Presence of other heart-valve replacement: Secondary | ICD-10-CM

## 2014-12-23 DIAGNOSIS — Z5181 Encounter for therapeutic drug level monitoring: Secondary | ICD-10-CM

## 2014-12-23 LAB — POCT INR: INR: 1.7

## 2014-12-23 NOTE — Telephone Encounter (Signed)
Patient mentioned at coumadin check that he has pain/ tightness in shoulders from his recent surgery but has obtained an rx from dr. Alla German.     Patient needs medial necessity form so that [facility] wll help financially with his denture placement as he had to have them emergently removed while admitted for an MI.

## 2014-12-23 NOTE — Telephone Encounter (Signed)
Please send letter to  [phone] fax  for [facility] so they will cover dentures for patient    Cindra Eves did dental surgery at [facility]

## 2014-12-23 NOTE — Telephone Encounter (Signed)
Spoke w/ pt.  He reports that he was in [facility] and "my teeth caused the issue with my heart.  All the bacteria got in there and they had to remove them before they could do heart surgery". He asks if Dr. Herbie Baltimore can write a letter of medical necessity so that pt can financial assistance to pay for dentures. Routed Dr. Lurene Shadow note from 11/25/14 to [facility] clinic, but he requests letter from Dr. Herbie Baltimore as well, as he states that Dr. Herbie Baltimore initially told him that his teeth were causing his heart issues.

## 2014-12-24 NOTE — Telephone Encounter (Signed)
I never said fatigue caused his problem. I said there is an association between or dental hygiene and coronary disease. The issue was he had to have his teeth removed to avoid having bacteremic seeding of a new valve.  I'll try to dictate a letter  Marykay Lex, M.D., M.S. Interventional Cardiologist   Pager # [phone]

## 2014-12-28 NOTE — Telephone Encounter (Signed)
I will dictate a letter to our evening while on STEMI call.  Marykay Lex, MD

## 2014-12-29 ENCOUNTER — Encounter: Payer: Self-pay | Admitting: Cardiology

## 2014-12-29 ENCOUNTER — Telehealth: Payer: Self-pay

## 2014-12-29 ENCOUNTER — Encounter: Payer: Self-pay | Admitting: *Deleted

## 2014-12-29 NOTE — Telephone Encounter (Signed)
Letter done - forwarded to you.  Witten Certain W c

## 2014-12-29 NOTE — Telephone Encounter (Signed)
Letter routed to [facility] at [phone].

## 2014-12-29 NOTE — Telephone Encounter (Signed)
Request from Disability Determination [employer], sent to [employer] on 12/29/2014 .

## 2014-12-30 ENCOUNTER — Ambulatory Visit (INDEPENDENT_AMBULATORY_CARE_PROVIDER_SITE_OTHER): Payer: Self-pay | Admitting: *Deleted

## 2014-12-30 ENCOUNTER — Telehealth: Payer: Self-pay | Admitting: Cardiology

## 2014-12-30 DIAGNOSIS — Z952 Presence of prosthetic heart valve: Secondary | ICD-10-CM

## 2014-12-30 DIAGNOSIS — Z5181 Encounter for therapeutic drug level monitoring: Secondary | ICD-10-CM

## 2014-12-30 DIAGNOSIS — Z954 Presence of other heart-valve replacement: Secondary | ICD-10-CM

## 2014-12-30 LAB — POCT INR: INR: 1.4

## 2014-12-30 NOTE — Telephone Encounter (Signed)
Spoke w/ pt.  He reports that he has had continued shoulder pain since his surgery.   Advised him to contact Dr. Zenaida Niece Tright's office, as he is the performing doc. He states that he has plenty of pain meds, but states that he is having a pinpoint burning sensation in his shoulder muscle and is concerned that this has persisted so long after his surgery.

## 2014-12-30 NOTE — Telephone Encounter (Signed)
Patient continues to c/o of shoulder pain.  Patient has to take medication for pain to get comfortable enough to sleep on couch.  Patient cannot tolerate sleeping lying down in bed.

## 2015-01-06 ENCOUNTER — Ambulatory Visit (INDEPENDENT_AMBULATORY_CARE_PROVIDER_SITE_OTHER): Payer: Self-pay | Admitting: *Deleted

## 2015-01-06 ENCOUNTER — Encounter: Payer: Self-pay | Admitting: Internal Medicine

## 2015-01-06 DIAGNOSIS — Z952 Presence of prosthetic heart valve: Secondary | ICD-10-CM

## 2015-01-06 DIAGNOSIS — Z5181 Encounter for therapeutic drug level monitoring: Secondary | ICD-10-CM

## 2015-01-06 DIAGNOSIS — Z954 Presence of other heart-valve replacement: Secondary | ICD-10-CM

## 2015-01-06 LAB — POCT INR: INR: 1.6

## 2015-01-08 ENCOUNTER — Telehealth: Payer: Self-pay

## 2015-01-08 NOTE — Telephone Encounter (Signed)
Request from Disability Determination Services , sent to [employer] on 01/11/2015.

## 2015-01-11 ENCOUNTER — Other Ambulatory Visit: Payer: Self-pay | Admitting: Cardiothoracic Surgery

## 2015-01-11 DIAGNOSIS — Z952 Presence of prosthetic heart valve: Secondary | ICD-10-CM

## 2015-01-13 ENCOUNTER — Ambulatory Visit (INDEPENDENT_AMBULATORY_CARE_PROVIDER_SITE_OTHER): Payer: Self-pay | Admitting: Cardiothoracic Surgery

## 2015-01-13 ENCOUNTER — Ambulatory Visit
Admission: RE | Admit: 2015-01-13 | Discharge: 2015-01-13 | Disposition: A | Discharge status: Home / Self Care | Payer: No Typology Code available for payment source | Source: Ambulatory Visit | Attending: Cardiothoracic Surgery | Admitting: Cardiothoracic Surgery

## 2015-01-13 ENCOUNTER — Ambulatory Visit (INDEPENDENT_AMBULATORY_CARE_PROVIDER_SITE_OTHER): Payer: Self-pay | Admitting: Pharmacist

## 2015-01-13 VITALS — BP 139/90 | HR 79 | Resp 20 | Ht 74.0 in | Wt 204.0 lb

## 2015-01-13 DIAGNOSIS — Z5181 Encounter for therapeutic drug level monitoring: Secondary | ICD-10-CM

## 2015-01-13 DIAGNOSIS — Z952 Presence of prosthetic heart valve: Secondary | ICD-10-CM

## 2015-01-13 DIAGNOSIS — Z954 Presence of other heart-valve replacement: Secondary | ICD-10-CM

## 2015-01-13 DIAGNOSIS — I350 Nonrheumatic aortic (valve) stenosis: Secondary | ICD-10-CM

## 2015-01-13 DIAGNOSIS — G8918 Other acute postprocedural pain: Secondary | ICD-10-CM

## 2015-01-13 DIAGNOSIS — I5021 Acute systolic (congestive) heart failure: Secondary | ICD-10-CM

## 2015-01-13 LAB — POCT INR: INR: 2.4

## 2015-01-13 MED ORDER — OXYCODONE HCL 5 MG PO TABS: 5.0000 mg | ORAL_TABLET | Freq: Four times a day (QID) | ORAL | Status: DC | PRN

## 2015-01-13 NOTE — Progress Notes (Signed)
PCP is HARDING, Piedad Climes, MD Referring Provider is Marinus Maw, MD  Chief Complaint  Patient presents with  . Routine Post Op    f/u from surgery with CXR, s/p AVR, Placement of permanent epicardial lead for biventricular pacemaker 12/01/14    KGY:JEHUD followup visit after urgent mechanical  aVR for severe aortic stenosis and heart block. Patient had permanent epicardial pacemaker system placed as well. Preoperative ejection fraction is 20-25%.Coronaries were normal. The patient has had a slow recovery. Still with some post sternotomy pain. No symptoms of CHF or angina. Current pacemaker functioning well from rhythm strip obtained in  office today.  chest x-rays clear with out pleural effusion and sternal wires are intact   Past Medical History  Diagnosis Date  . Congenital bicuspid aortic valve 09/2014  . Moderate aortic stenosis by prior echocardiogram 10/04/2014    ; possibly severe. Mean gradient and cardiac catheterization lab of 32 mmHg.  Marland Kitchen Nonischemic cardiomyopathy     last EF 50-55%   . Third degree heart block 10/04/2014    Transient CHB related to Acute Combined HF  . Chronic combined systolic and diastolic CHF (congestive heart failure) 09/2014    Past Surgical History  Procedure Laterality Date  . Surgery scrotal / testicular      for undescended testicle as a child  . Cardiac catheterization N/A 10/04/2014    Procedure: Left Heart Cath and Coronary Angiography;  Surgeon: Marykay Lex, MD;  Location: [facility] INVASIVE CV LAB;  Service: Cardiovascular;  Laterality: N/A;  . Cardiac catheterization  10/04/2014    Procedure: Temporary Pacemaker;  Surgeon: Marykay Lex, MD;  Location: [facility] INVASIVE CV LAB;  Service: Cardiovascular;;  . Cardiac catheterization N/A 11/25/2014    Procedure: Temporary Pacemaker;  Surgeon: Marinus Maw, MD;  Location: [facility] INVASIVE CV LAB;  Service: Cardiovascular;  Laterality: N/A;  . Multiple extractions with alveoloplasty N/A 11/25/2014    Procedure:  Extraction of tooth #'s 1,2,3,4,5,6,7,9, 10, 11, 12,13,14,16, 19, 20, 21, 22, 23, 24, 25, 26, 27, 28, 29, 30, 31 with alveoloplasty;  Surgeon: Charlynne Pander, DDS;  Location: [facility] OR;  Service: Oral Surgery;  Laterality: N/A;  . Aortic valve replacement N/A 12/01/2014    Procedure: AORTIC VALVE REPLACEMENT (AVR);  Surgeon: Kerin Perna, MD;  Location: [facility] OR;  Service: Open Heart Surgery;  Laterality: N/A;  . Tee without cardioversion N/A 12/01/2014    Procedure: TRANSESOPHAGEAL ECHOCARDIOGRAM (TEE);  Surgeon: Kerin Perna, MD;  Location: [facility] OR;  Service: Open Heart Surgery;  Laterality: N/A;    Family History  Problem Relation Age of Onset  . CAD Paternal Grandmother     Social History Social History  Substance Use Topics  . Smoking status: Former Smoker -- 2.00 packs/day for 20 years    Types: Cigarettes  . Smokeless tobacco: Never Used     Comment: quit 10/04/14  . Alcohol Use: 0.0 oz/week    0 Standard drinks or equivalent per week     Comment: rarely    Current Outpatient Prescriptions  Medication Sig Dispense Refill  . aspirin EC 81 MG EC tablet Take 1 tablet (81 mg total) by mouth daily. 30 tablet 1  . atorvastatin (LIPITOR) 40 MG tablet Take 1 tablet (40 mg total) by mouth daily. 30 tablet 11  . ferrous fumarate-b12-vitamic C-folic acid (TRINSICON / FOLTRIN) capsule Take 1 capsule by mouth daily. For one month then stop. 30 capsule 1  . metoprolol tartrate (LOPRESSOR) 25 MG tablet  Take 0.5 tablets (12.5 mg total) by mouth 2 (two) times daily. 30 tablet 1  . oxyCODONE (OXY IR/ROXICODONE) 5 MG immediate release tablet Take 1 tablet (5 mg total) by mouth every 6 (six) hours as needed for severe pain. 40 tablet 0  . warfarin (COUMADIN) 5 MG tablet Take 1 tablet (5 mg total) by mouth daily at 6 PM. Or as directed 30 tablet 1  . atorvastatin (LIPITOR) 80 MG tablet Take 80 mg by mouth daily at 6 PM.  1   No current facility-administered medications for this visit.    No Known  Allergies  Review of Systems   Improved strength appetite Try to walk daily Not smoking No complications from Coumadin for his mechanical aVR BP 139/90 mmHg  Pulse 79  Resp 20  Ht  (1.88 m)  Wt 204 lb (92.534 kg)  BMI 26.18 kg/m2  SpO2 97% Physical Exam Alert and comfortable Lungs clear Heart rate regular No murmur, good closure sound ofprosthetic aVR No edema  Diagnostic Tests: Chest x-ray clear  Impression: Early course satisfactory after urgent aVR with permanent pacemaker  Plan: Patient will return for followup in approximately 6 weeks. With preop ejection fraction of 20-25% he would be disabled from his previous occupation. Hopefully LV function will improve with time.  Mikey Bussing, MD [facility] [phone]

## 2015-01-20 ENCOUNTER — Ambulatory Visit (INDEPENDENT_AMBULATORY_CARE_PROVIDER_SITE_OTHER): Payer: Self-pay | Admitting: Pharmacist

## 2015-01-20 DIAGNOSIS — Z952 Presence of prosthetic heart valve: Secondary | ICD-10-CM

## 2015-01-20 DIAGNOSIS — Z954 Presence of other heart-valve replacement: Secondary | ICD-10-CM

## 2015-01-20 DIAGNOSIS — Z5181 Encounter for therapeutic drug level monitoring: Secondary | ICD-10-CM

## 2015-01-20 LAB — POCT INR: INR: 3.1

## 2015-02-03 ENCOUNTER — Ambulatory Visit (INDEPENDENT_AMBULATORY_CARE_PROVIDER_SITE_OTHER): Payer: Self-pay

## 2015-02-03 DIAGNOSIS — Z952 Presence of prosthetic heart valve: Secondary | ICD-10-CM

## 2015-02-03 DIAGNOSIS — Z954 Presence of other heart-valve replacement: Secondary | ICD-10-CM

## 2015-02-03 DIAGNOSIS — Z5181 Encounter for therapeutic drug level monitoring: Secondary | ICD-10-CM

## 2015-02-03 LAB — POCT INR: INR: 2

## 2015-02-05 ENCOUNTER — Other Ambulatory Visit: Payer: Self-pay | Admitting: Physician Assistant

## 2015-02-05 ENCOUNTER — Other Ambulatory Visit: Payer: Self-pay | Admitting: *Deleted

## 2015-02-05 ENCOUNTER — Other Ambulatory Visit: Payer: Self-pay

## 2015-02-05 ENCOUNTER — Telehealth: Payer: Self-pay

## 2015-02-05 MED ORDER — WARFARIN SODIUM 5 MG PO TABS: 5.0000 mg | ORAL_TABLET | Freq: Every day | ORAL | Status: DC

## 2015-02-05 MED ORDER — WARFARIN SODIUM 5 MG PO TABS: ORAL_TABLET | ORAL | Status: DC

## 2015-02-05 NOTE — Telephone Encounter (Signed)
Duplicate note / err

## 2015-02-05 NOTE — Telephone Encounter (Signed)
Pt called needing refill on coumadin Refill done as requested

## 2015-02-08 DIAGNOSIS — Z736 Limitation of activities due to disability: Secondary | ICD-10-CM

## 2015-02-24 ENCOUNTER — Ambulatory Visit (INDEPENDENT_AMBULATORY_CARE_PROVIDER_SITE_OTHER): Payer: Self-pay

## 2015-02-24 DIAGNOSIS — Z5181 Encounter for therapeutic drug level monitoring: Secondary | ICD-10-CM

## 2015-02-24 DIAGNOSIS — Z954 Presence of other heart-valve replacement: Secondary | ICD-10-CM

## 2015-02-24 DIAGNOSIS — Z952 Presence of prosthetic heart valve: Secondary | ICD-10-CM

## 2015-02-24 LAB — POCT INR: INR: 2.4

## 2015-03-07 ENCOUNTER — Other Ambulatory Visit: Payer: Self-pay | Admitting: Physician Assistant

## 2015-03-08 ENCOUNTER — Other Ambulatory Visit: Payer: Self-pay | Admitting: Cardiology

## 2015-03-08 NOTE — Telephone Encounter (Signed)
°  1. Which medications need to be refilled? Metoprolol  2. Which pharmacy is medication to be sent to?[facility]-[phone]  3. Do they need a 30 day or 90 day supply? 90 and refills  4. Would they like a call back once the medication has been sent to the pharmacy? yes

## 2015-03-09 ENCOUNTER — Other Ambulatory Visit: Payer: Self-pay

## 2015-03-09 MED ORDER — METOPROLOL TARTRATE 25 MG PO TABS: 12.5000 mg | ORAL_TABLET | Freq: Two times a day (BID) | ORAL | Status: DC

## 2015-03-09 NOTE — Telephone Encounter (Signed)
Error

## 2015-03-09 NOTE — Telephone Encounter (Signed)
Refill sent for metoprolol tart 25 mg  

## 2015-03-17 ENCOUNTER — Ambulatory Visit (INDEPENDENT_AMBULATORY_CARE_PROVIDER_SITE_OTHER): Payer: Self-pay | Admitting: Cardiothoracic Surgery

## 2015-03-17 VITALS — BP 129/82 | HR 59 | Resp 16 | Ht 74.0 in | Wt 240.0 lb

## 2015-03-17 DIAGNOSIS — Z952 Presence of prosthetic heart valve: Secondary | ICD-10-CM

## 2015-03-17 DIAGNOSIS — I350 Nonrheumatic aortic (valve) stenosis: Secondary | ICD-10-CM

## 2015-03-17 DIAGNOSIS — Z954 Presence of other heart-valve replacement: Secondary | ICD-10-CM

## 2015-03-17 NOTE — Progress Notes (Signed)
PCP is HARDING, Piedad Climes, MD Referring Provider is Marinus Maw, MD  Chief Complaint  Patient presents with  . Routine Post Op    6 wk f/u    HPI:6 month final surgical followup after urgent aortic valve replacement with a mechanical aVR for severe aortic stenosis and severe LV dysfunction. The patient also had preoperative complete heart block and had a permanent epicardial pacing system placed. The patient is done very well. Recently he states he has developed symptoms of shortness of breath and facial edema. No pedal edema. No evidence of abdominal edema. His weight has been stable. Pacemaker is functioning adequately and has been followed by EP cardiology--Dr. Gilman Schmidt. He is taking Coumadin and he states his INR levels and Coumadin dosing has been performed by the pharmacist in the Coumadin clinic. No bleeding complications noted.  The patient complains of reduced range of motion of his right shoulder--similar to a frozen shoulder following sternotomy.  He states he is still having incisional pain-soreness. His cardiologist is Dr. [name]. He has not had a postop echo to see how his LV has recovered.   Past Medical History  Diagnosis Date  . Congenital bicuspid aortic valve 09/2014  . Moderate aortic stenosis by prior echocardiogram 10/04/2014    ; possibly severe. Mean gradient and cardiac catheterization lab of 32 mmHg.  Marland Kitchen Nonischemic cardiomyopathy     last EF 50-55%   . Third degree heart block 10/04/2014    Transient CHB related to Acute Combined HF  . Chronic combined systolic and diastolic CHF (congestive heart failure) 09/2014    Past Surgical History  Procedure Laterality Date  . Surgery scrotal / testicular      for undescended testicle as a child  . Cardiac catheterization N/A 10/04/2014    Procedure: Left Heart Cath and Coronary Angiography;  Surgeon: Marykay Lex, MD;  Location: [facility] INVASIVE CV LAB;  Service: Cardiovascular;  Laterality: N/A;  . Cardiac  catheterization  10/04/2014    Procedure: Temporary Pacemaker;  Surgeon: Marykay Lex, MD;  Location: [facility] INVASIVE CV LAB;  Service: Cardiovascular;;  . Cardiac catheterization N/A 11/25/2014    Procedure: Temporary Pacemaker;  Surgeon: Marinus Maw, MD;  Location: [facility] INVASIVE CV LAB;  Service: Cardiovascular;  Laterality: N/A;  . Multiple extractions with alveoloplasty N/A 11/25/2014    Procedure: Extraction of tooth #'s 1,2,3,4,5,6,7,9, 10, 11, 12,13,14,16, 19, 20, 21, 22, 23, 24, 25, 26, 27, 28, 29, 30, 31 with alveoloplasty;  Surgeon: Charlynne Pander, DDS;  Location: [facility] OR;  Service: Oral Surgery;  Laterality: N/A;  . Aortic valve replacement N/A 12/01/2014    Procedure: AORTIC VALVE REPLACEMENT (AVR);  Surgeon: Kerin Perna, MD;  Location: [facility] OR;  Service: Open Heart Surgery;  Laterality: N/A;  . Tee without cardioversion N/A 12/01/2014    Procedure: TRANSESOPHAGEAL ECHOCARDIOGRAM (TEE);  Surgeon: Kerin Perna, MD;  Location: [facility] OR;  Service: Open Heart Surgery;  Laterality: N/A;    Family History  Problem Relation Age of Onset  . CAD Paternal Grandmother     Social History Social History  Substance Use Topics  . Smoking status: Former Smoker -- 2.00 packs/day for 20 years    Types: Cigarettes  . Smokeless tobacco: Never Used     Comment: quit 10/04/14  . Alcohol Use: 0.0 oz/week    0 Standard drinks or equivalent per week     Comment: rarely    Current Outpatient Prescriptions  Medication Sig Dispense Refill  .  aspirin EC 81 MG EC tablet Take 1 tablet (81 mg total) by mouth daily. 30 tablet 1  . atorvastatin (LIPITOR) 40 MG tablet Take 1 tablet (40 mg total) by mouth daily. 30 tablet 11  . metoprolol tartrate (LOPRESSOR) 25 MG tablet Take 0.5 tablets (12.5 mg total) by mouth 2 (two) times daily. 30 tablet 3  . oxyCODONE (OXY IR/ROXICODONE) 5 MG immediate release tablet Take 1 tablet (5 mg total) by mouth every 6 (six) hours as needed for severe pain. 40 tablet 0  . warfarin  (COUMADIN) 5 MG tablet Take as directed by coumadin clinic 40 tablet 2   No current facility-administered medications for this visit.    No Known Allergies  Review of Systems  No fever Good appetite Walking 20 minutes daily No drainage from the incisions No pedal edema  BP 129/82 mmHg  Pulse 59  Resp 16  Ht  (1.88 m)  Wt 240 lb (108.863 kg)  BMI 30.80 kg/m2  SpO2 97% Physical Exam Alert and comfortable Neuro intact Heart rate regular normal valve closure click, no murmur Extremities without edema or tenderness Lungs are clear Abdomen soft nontender without ascites Neck without JVD mass Teeth are all status post extraction  Diagnostic Tests: none  Impression: Excellent recovery after urgent  Aortic valve replacement for severe aortic stenosis with LV dysfunction and permanent epicardial pacing system for preoperative heart block.  Plan:he'll be followed by his cardiologist Dr. Herbie Baltimore and return here as needed. We'll direct the patient to see Dr. Herbie Baltimore for recent onset of symptoms consistent with heart failure as he may need medications readjusted as he is not currently on Lasix.   Mikey Bussing, MD [facility] [phone]

## 2015-03-23 ENCOUNTER — Ambulatory Visit (INDEPENDENT_AMBULATORY_CARE_PROVIDER_SITE_OTHER): Payer: Self-pay | Admitting: Internal Medicine

## 2015-03-23 ENCOUNTER — Encounter: Payer: Self-pay | Admitting: Internal Medicine

## 2015-03-23 ENCOUNTER — Telehealth: Payer: Self-pay

## 2015-03-23 VITALS — BP 114/80 | HR 60 | Ht 74.0 in | Wt 237.0 lb

## 2015-03-23 DIAGNOSIS — I5041 Acute combined systolic (congestive) and diastolic (congestive) heart failure: Secondary | ICD-10-CM

## 2015-03-23 DIAGNOSIS — I428 Other cardiomyopathies: Secondary | ICD-10-CM

## 2015-03-23 DIAGNOSIS — I429 Cardiomyopathy, unspecified: Secondary | ICD-10-CM

## 2015-03-23 DIAGNOSIS — I442 Atrioventricular block, complete: Secondary | ICD-10-CM

## 2015-03-23 DIAGNOSIS — R0602 Shortness of breath: Secondary | ICD-10-CM

## 2015-03-23 LAB — CUP PACEART INCLINIC DEVICE CHECK
Battery Remaining Longevity: 26 mo
Battery Voltage: 2.97 V
Brady Statistic AP VP Percent: 25.49 %
Brady Statistic AP VS Percent: 0.02 %
Brady Statistic AS VP Percent: 73.8 %
Brady Statistic RA Percent Paced: 25.51 %
Brady Statistic RV Percent Paced: 99.29 %
Implantable Lead Implant Date: 20160705
Implantable Lead Implant Date: 20160705
Implantable Lead Implant Date: 20160705
Implantable Lead Location: 753858
Implantable Lead Location: 753859
Implantable Lead Location: 753860
Implantable Lead Model: 4968
Implantable Lead Model: 5071
Lead Channel Impedance Value: 342 Ohm
Lead Channel Impedance Value: 4047 Ohm
Lead Channel Impedance Value: 4047 Ohm
Lead Channel Impedance Value: 4047 Ohm
Lead Channel Impedance Value: 4047 Ohm
Lead Channel Impedance Value: 551 Ohm
Lead Channel Impedance Value: 931 Ohm
Lead Channel Pacing Threshold Amplitude: 0.625 V
Lead Channel Pacing Threshold Amplitude: 2.375 V
Lead Channel Sensing Intrinsic Amplitude: 12.25 mV
Lead Channel Sensing Intrinsic Amplitude: 4.625 mV
Lead Channel Sensing Intrinsic Amplitude: 5 mV
Lead Channel Setting Pacing Amplitude: 2 V
Lead Channel Setting Pacing Amplitude: 5 V
Lead Channel Setting Pacing Pulse Width: 1 ms
Lead Channel Setting Sensing Sensitivity: 4 mV
MDC IDC MSMT LEADCHNL LV IMPEDANCE VALUE: 4047 Ohm
MDC IDC MSMT LEADCHNL LV PACING THRESHOLD AMPLITUDE: 1.75 V
MDC IDC MSMT LEADCHNL LV PACING THRESHOLD PULSEWIDTH: 0.5 ms
MDC IDC MSMT LEADCHNL RA PACING THRESHOLD PULSEWIDTH: 0.4 ms
MDC IDC MSMT LEADCHNL RV IMPEDANCE VALUE: 380 Ohm
MDC IDC MSMT LEADCHNL RV PACING THRESHOLD PULSEWIDTH: 0.4 ms
MDC IDC MSMT LEADCHNL RV SENSING INTR AMPL: 12.25 mV
MDC IDC SESS DTM: 20161025123832
MDC IDC SET LEADCHNL LV PACING AMPLITUDE: 2.5 V
MDC IDC SET LEADCHNL RV PACING PULSEWIDTH: 1 ms
MDC IDC STAT BRADY AS VS PERCENT: 0.69 %

## 2015-03-23 NOTE — Progress Notes (Signed)
Patient Care Team: Marykay Lex, MD as PCP - General (Cardiology)   HPI  Nicholas Horton is a 39 y.o. male Seen for pacemaker implantation 7/16-CRT P done in conjunction with aortic valve replacement and epicardial LV and RV/atrial  unipolar lead.  When initially evaluated with complete heart block May 2016 his ejection fraction was 20-25%. A month later  the ejection fraction was remeasured at 50-55% and severe aortic stenosis was appreciated and he underwent valve replacement following dental extraction.    He has complaints of shortness of breath. This is cooccuring with abdominal swelling; he does not have peripheral edema. He does have a diuretic that he uses when necessary.  Records and Results Reviewed As above   Past Medical History  Diagnosis Date  . Congenital bicuspid aortic valve 09/2014  . Moderate aortic stenosis by prior echocardiogram 10/04/2014    ; possibly severe. Mean gradient and cardiac catheterization lab of 32 mmHg.  Marland Kitchen Nonischemic cardiomyopathy (HCC)     last EF 50-55%   . Third degree heart block (HCC) 10/04/2014    Transient CHB related to Acute Combined HF  . Chronic combined systolic and diastolic CHF (congestive heart failure) (HCC) 09/2014    Past Surgical History  Procedure Laterality Date  . Surgery scrotal / testicular      for undescended testicle as a child  . Cardiac catheterization N/A 10/04/2014    Procedure: Left Heart Cath and Coronary Angiography;  Surgeon: Marykay Lex, MD;  Location: [facility] INVASIVE CV LAB;  Service: Cardiovascular;  Laterality: N/A;  . Cardiac catheterization  10/04/2014    Procedure: Temporary Pacemaker;  Surgeon: Marykay Lex, MD;  Location: [facility] INVASIVE CV LAB;  Service: Cardiovascular;;  . Cardiac catheterization N/A 11/25/2014    Procedure: Temporary Pacemaker;  Surgeon: Marinus Maw, MD;  Location: [facility] INVASIVE CV LAB;  Service: Cardiovascular;  Laterality: N/A;  . Multiple extractions with alveoloplasty  N/A 11/25/2014    Procedure: Extraction of tooth #'s 1,2,3,4,5,6,7,9, 10, 11, 12,13,14,16, 19, 20, 21, 22, 23, 24, 25, 26, 27, 28, 29, 30, 31 with alveoloplasty;  Surgeon: Charlynne Pander, DDS;  Location: [facility] OR;  Service: Oral Surgery;  Laterality: N/A;  . Aortic valve replacement N/A 12/01/2014    Procedure: AORTIC VALVE REPLACEMENT (AVR);  Surgeon: Kerin Perna, MD;  Location: [facility] OR;  Service: Open Heart Surgery;  Laterality: N/A;  . Tee without cardioversion N/A 12/01/2014    Procedure: TRANSESOPHAGEAL ECHOCARDIOGRAM (TEE);  Surgeon: Kerin Perna, MD;  Location: [facility] OR;  Service: Open Heart Surgery;  Laterality: N/A;    Current Outpatient Prescriptions  Medication Sig Dispense Refill  . aspirin EC 81 MG EC tablet Take 1 tablet (81 mg total) by mouth daily. 30 tablet 1  . atorvastatin (LIPITOR) 40 MG tablet Take 1 tablet (40 mg total) by mouth daily. 30 tablet 11  . metoprolol tartrate (LOPRESSOR) 25 MG tablet Take 0.5 tablets (12.5 mg total) by mouth 2 (two) times daily. 30 tablet 3  . oxyCODONE (OXY IR/ROXICODONE) 5 MG immediate release tablet Take 1 tablet (5 mg total) by mouth every 6 (six) hours as needed for severe pain. 40 tablet 0  . warfarin (COUMADIN) 5 MG tablet Take as directed by coumadin clinic 40 tablet 2   No current facility-administered medications for this visit.    No Known Allergies    Review of Systems negative except from HPI and PMH  Physical Exam BP 114/80 mmHg  Pulse  59  Ht 6\' 2"  (1.88 m)  Wt 237 lb (107.502 kg)  BMI 30.42 kg/m2 Well developed and well nourished in no acute distress HENT normal E scleral and icterus clear Neck Supple JVP 6-7 cm; carotids brisk and full Clear to ausculation  Device pocket well healed; without hematoma or erythema.  There is no tethering  *Regular rate and rhythm, mechanical S2 with her early systolic murmur Soft with active bowel sounds No clubbing cyanosis  Edema Alert and oriented, grossly normal motor and  sensory function Skin Warm and Dry  ECG demonstrates AV pacing at 60 Intervals 13/14/49  Assessment and  Plan  Complete heart block  CRT P-[employer]tr[employer]  High RV greater than LV pacing thresholds  Dyspnea on exertion   There is a problem with his unipolar RV greater than LV lead with high pacing thresholds. This currently translates into an estimated longevity of his device of 2 years. For right now, I do not think that it is worth operative intervention; I reviewed this with the patient. I think however, given the high RV outputs that at the time of generator replacement endovascular lead should be placed. An LV endovascular lead may also be appropriate depending on LV pacing thresholds.  For now, we'll try to reprogram the device to maximize longevity.  There is no significant right-sided volume overload either in his neck or his peripherY;  however, he is quite impressed that there is a correlation between his infrequent use of his diuretics and his dyspnea. I'll have him take his furosemide every other day. We will plan to repeat his ultrasound to see left ventricular function. I will have him follow-up with Dr. Herbie Baltimore his primary cardiologist.  We will also measure BNP today.

## 2015-03-23 NOTE — Telephone Encounter (Signed)
Received records request Disability Determination Services, forwarded to [employer] for processing.  

## 2015-03-23 NOTE — Patient Instructions (Addendum)
Medication Instructions: - Take lasix (furosemide) 20 mg one tablet by mouth every other day  Labwork: - Your physician recommends that you have lab work today: BMP/ BNP  Procedures/Testing: - Your physician has requested that you have an echocardiogram. Echocardiography is a painless test that uses sound waves to create images of your heart. It provides your doctor with information about the size and shape of your heart and how well your heart's chambers and valves are working. This procedure takes approximately one hour. There are no restrictions for this procedure.  Follow-Up: - Your physician recommends that you schedule a follow-up appointment in: 4-6 weeks with Dr. Herbie Baltimore.  - Remote monitoring is used to monitor your Pacemaker of ICD from home. This monitoring reduces the number of office visits required to check your device to one time per year. It allows [place] to keep an eye on the functioning of your device to ensure it is working properly. You are scheduled for a device check from home on 06/22/15. You may send your transmission at any time that day. If you have a wireless device, the transmission will be sent automatically. After your physician reviews your transmission, you will receive a postcard with your next transmission date.  - Your physician wants you to follow-up in: 1 year with Dr. Graciela Husbands. You will receive a reminder letter in the mail two months in advance. If you don't receive a letter, please call our office to schedule the follow-up appointment  Any Additional Special Instructions Will Be Listed Below (If Applicable). - none

## 2015-03-24 ENCOUNTER — Ambulatory Visit (INDEPENDENT_AMBULATORY_CARE_PROVIDER_SITE_OTHER): Payer: Self-pay

## 2015-03-24 DIAGNOSIS — Z5181 Encounter for therapeutic drug level monitoring: Secondary | ICD-10-CM

## 2015-03-24 DIAGNOSIS — Z954 Presence of other heart-valve replacement: Secondary | ICD-10-CM

## 2015-03-24 DIAGNOSIS — Z952 Presence of prosthetic heart valve: Secondary | ICD-10-CM

## 2015-03-24 LAB — POCT INR: INR: 1.9

## 2015-03-24 LAB — BASIC METABOLIC PANEL
BUN / CREAT RATIO: 15 (ref 8–19)
BUN: 12 mg/dL (ref 6–20)
CO2: 24 mmol/L (ref 18–29)
CREATININE: 0.79 mg/dL (ref 0.76–1.27)
Calcium: 10.1 mg/dL (ref 8.7–10.2)
Chloride: 100 mmol/L (ref 97–106)
GFR calc Af Amer: 132 mL/min/{1.73_m2} (ref >59)
GFR, EST NON AFRICAN AMERICAN: 114 mL/min/{1.73_m2} (ref >59)
Glucose: 112 mg/dL — ABNORMAL HIGH (ref 65–99)
Potassium: 4.1 mmol/L (ref 3.5–5.2)
Sodium: 142 mmol/L (ref 136–144)

## 2015-03-24 LAB — BRAIN NATRIURETIC PEPTIDE: BNP: 74.4 pg/mL (ref 0.0–100.0)

## 2015-03-26 ENCOUNTER — Telehealth: Payer: Self-pay

## 2015-03-26 NOTE — Telephone Encounter (Signed)
CALL PATIENT TO VERIFY INSURANCE PATIENT STATES HE IS STILL SELF PAY NOTHING HAS CHANGED

## 2015-03-30 ENCOUNTER — Other Ambulatory Visit: Payer: Self-pay | Admitting: Internal Medicine

## 2015-03-30 ENCOUNTER — Other Ambulatory Visit: Payer: Self-pay

## 2015-03-30 ENCOUNTER — Ambulatory Visit (INDEPENDENT_AMBULATORY_CARE_PROVIDER_SITE_OTHER): Payer: Self-pay

## 2015-03-30 DIAGNOSIS — Z954 Presence of other heart-valve replacement: Secondary | ICD-10-CM

## 2015-03-30 DIAGNOSIS — Z952 Presence of prosthetic heart valve: Secondary | ICD-10-CM

## 2015-03-30 DIAGNOSIS — R0602 Shortness of breath: Secondary | ICD-10-CM

## 2015-03-30 HISTORY — PX: TRANSTHORACIC ECHOCARDIOGRAM: SHX275

## 2015-03-31 ENCOUNTER — Ambulatory Visit: Payer: Self-pay | Admitting: Cardiothoracic Surgery

## 2015-04-01 ENCOUNTER — Ambulatory Visit: Payer: Self-pay | Admitting: Cardiology

## 2015-04-05 ENCOUNTER — Telehealth: Payer: Self-pay | Admitting: Cardiology

## 2015-04-05 ENCOUNTER — Encounter: Payer: Self-pay | Admitting: Cardiology

## 2015-04-05 ENCOUNTER — Telehealth: Payer: Self-pay

## 2015-04-05 NOTE — Telephone Encounter (Signed)
Patient needs letter that says   Patient is unable to work in any capacity for [employer] .  This is for Food Stamps Eligibility  Needs before 11-11 16  Please fax to [phone] .  Please also fwd My chart for patient to refer to if issues with SS office.

## 2015-04-05 NOTE — Telephone Encounter (Signed)
Nicholas Horton is calling because he is needing a letter stating that he is not able to work please call at [phone] [name]) . Please call   Thanks

## 2015-04-05 NOTE — Telephone Encounter (Signed)
So - I am not sure that I can go along with writing a letter getting him out of work. He just had an Echo that was read as normal EF.   He should be recovering from a Cardiac standpoint.   Marykay Lex, MD

## 2015-04-05 NOTE — Telephone Encounter (Signed)
Spoke with  patient and wife. Both states patient needs a letter for social service  stating patient is not able to work due to his inability to strenuous work - ( weight <10LBS,lifitng ,pulling or pulling.) Patient states both his jobs required him to deal with lifting,pushing,pulling weights more than 100 lbs at a time. Patient states Dr Zenaida Niece TRIGT,and Dr Graciela Husbands  Informed  Him of the inability not to work.   Per Patient , Dr Zenaida Niece TRIGT's office has released patient -deferred back to cardiologist.(aortic valve replacement 11/2014) Patient last saw Dr Graciela Husbands on 03/23/15- per patient,it was discussed for patient not to return to work- ( function capacity) Patient had CRT-P implanted 11/2014.  Dr Herbie Baltimore will not see patient until 05/12/15 in [place] office  Patient's wife states she will bring capacity function form that was filled out to the [place] office. RN informed patient and wife will send this message to Dr Graciela Husbands and Dr Herbie Baltimore.    Letter is needed -ATTN  ETHAN RAYNOR  - SOCIAL SERVICE ( TRYING TO GET HELP) FAX- [phone]    ,PHONE [phone]

## 2015-04-05 NOTE — Telephone Encounter (Signed)
Received records request Disability Determination Services, forwarded to [employer] for processing.  

## 2015-04-06 NOTE — Telephone Encounter (Signed)
Dr. Graciela Husbands called and spoke with this patient last night and explained to him that he could not write him a letter stating he could not work in any capacity. He explained to him that from a device standpoint and since he has a normal EF, he is released to go back to work without restrictions. The patient is fully aware Dr. Graciela Husbands will not write a letter for him. He is complaining that he is SOB with activity and that is what is keeping him from working. The patient states that "Dr. Herbie Baltimore and Dr. Donata Clay said they would help him in any way to get disability."  Dr. Graciela Husbands advised him to speak with Dr. Herbie Baltimore and Dr. Donata Clay for assistance with disability.

## 2015-04-13 ENCOUNTER — Encounter: Payer: Self-pay | Admitting: Internal Medicine

## 2015-04-14 ENCOUNTER — Encounter: Payer: Self-pay | Admitting: Cardiology

## 2015-04-14 ENCOUNTER — Ambulatory Visit (INDEPENDENT_AMBULATORY_CARE_PROVIDER_SITE_OTHER): Payer: Self-pay | Admitting: Cardiology

## 2015-04-14 VITALS — BP 120/70 | HR 67 | Ht 74.0 in | Wt 244.8 lb

## 2015-04-14 DIAGNOSIS — I5032 Chronic diastolic (congestive) heart failure: Secondary | ICD-10-CM | POA: Insufficient documentation

## 2015-04-14 DIAGNOSIS — I429 Cardiomyopathy, unspecified: Secondary | ICD-10-CM

## 2015-04-14 DIAGNOSIS — I442 Atrioventricular block, complete: Secondary | ICD-10-CM

## 2015-04-14 DIAGNOSIS — Z950 Presence of cardiac pacemaker: Secondary | ICD-10-CM

## 2015-04-14 DIAGNOSIS — Z954 Presence of other heart-valve replacement: Secondary | ICD-10-CM

## 2015-04-14 DIAGNOSIS — R0602 Shortness of breath: Secondary | ICD-10-CM

## 2015-04-14 DIAGNOSIS — Z952 Presence of prosthetic heart valve: Secondary | ICD-10-CM

## 2015-04-14 DIAGNOSIS — I428 Other cardiomyopathies: Secondary | ICD-10-CM

## 2015-04-14 DIAGNOSIS — R079 Chest pain, unspecified: Secondary | ICD-10-CM

## 2015-04-14 MED ORDER — ATORVASTATIN CALCIUM 20 MG PO TABS: 20.0000 mg | ORAL_TABLET | Freq: Every day | ORAL | Status: DC

## 2015-04-14 NOTE — Patient Instructions (Signed)
Medication Instructions:  Your physician has recommended you make the following change in your medication:  DECREASE your lipitor to 20mg  once per day   Labwork: none  Testing/Procedures: none  Follow-Up: Your physician recommends that you schedule a follow-up appointment in: three months with Dr. Herbie Baltimore.    Any Other Special Instructions Will Be Listed Below (If Applicable).     If you need a refill on your cardiac medications before your next appointment, please call your pharmacy.

## 2015-04-14 NOTE — Progress Notes (Signed)
PCP: Marykay Lex, MD  Clinic Note: Chief Complaint  Patient presents with  . other    Pt. c/o shortness of breath and chest pain. Meds reviewed by the patient verbally.   . Cardiac Valve Problem    HPI: Nicholas Horton is a 39 y.o. male with a PMH below who presents today for 6 month f/u of Valvular CM - s/p Mechanical AVR with PPM for Severe AS & 3rd Deg AVB.  Nicholas Horton was last seen by me on Oct 14, 2014.  Was still noting fatigue & occasional dyspnea.  -- Seen by Dr. Donata Clay 10/19: -- Dr. Graciela Husbands 10/25 --> relook Echo 11/1  Recent Hospitalizations: 6/25 - 7/12 2016 => syncope from 3rd Deg AVB - Severe AS --> AVR & PPM. Also had multiple dental extractions.  Procedures: 1. Multiple dental extractions. - 11/25/2014  2. Insertion of temporary permanent transvenous pacemaker - 11/25/2014  3. AORTIC VALVE REPLACEMENT (21 mm Carbomedics Mechanical valve) PLACEMENT OF TEMPORARY VENTRICULAR PACEMAKER VIA RIGHT FEMORAL VEIN  PLACEMENT OF PERMANENT EPICARDIAL PACING LEADS FOR BI-VENTRICULAR PACEMAKER - 12/01/2014  Studies Reviewed:   Cardiac Cath: 10/03/2013.  1. Angiographically minimal CAD 2. Dilated cardiomyopathy with severely reduced EF of roughly 20-25%; global hypokinesis 3. Moderate if not Severe Aortic Stenosis 4. Acute Combined Systolic and Diastolic Heart Failure with Elevated EDP of 30-40 MmHg 5. Third-Degree AV block with accelerated junctional escape beats/rhythm (rates of roughly 40-70 bpm)  6. Successful Temporary Transient Replacement with backup rate of 40 bpm.   10/05/2014 Cardiac MRI- IMPRESSION: 1. Normal left ventricular size with mild LV hypertrophy. EF 43% with diffuse hypokinesis. 2. Normal RV size and systolic function. 3. Bicuspid aortic valve with at least moderate aortic stenosis, would assess valve gradient by echo. 4. No definite myocardial LGE, so no definitive evidence for infiltrative disease, myocarditis, or prior myocardial  infarction.6/   Echo 11/20/14: Pre-op - Left ventricle: The cavity size was mildly dilated. Systolic function was normal. EF 50% - 55%.   Wall motion was normal; there were no regional wall motion abnormalities. - Aortic valve: Transvalvular velocity was increased. Moderate to severe stenosis. There was mild regurgitation. Peak velocity (S): 390 cm/s. Mean gradient (S): 33 mm Hg. Peak gradient (S): 61 mm Hg. - Mitral valve: There was mild regurgitation. - Left atrium: The atrium was mildly dilated. - Right ventricle: Systolic function was normal. - Pulmonary arteries: Systolic pressure was within the normal range.   Echo 03/30/15: Technically difficult study. - Left ventricle: The cavity size was mildly dilated. Systolicfunction was normal. EF ~ 50% to 55%.   Regional wall motion abnormalities cannot be excluded.   Left ventricular diastolic function parameters were normal. - Aortic valve: Prosthetic aortic valve present. Transvalvularvelocity was increased. There was mild to moderate stenosis. There was mild to moderate regurgitation. Peak velocity (S): 333 cm/s. Mean gradient (S): 21 mm Hg. Peak gradient (S): 44 mm Hg.Valve area (VTI): 1.19 cm^2. - Left atrium: The atrium was mildly dilated. - Right ventricle: Systolic function was normal. - Pulmonary arteries: Systolic pressure was within the normal range.   Interval History: Nicholas Horton has gained a considerable amount of weight since his operation. He pretty much denies doing any type of exercise or dietary monitoring.  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  He never did [facility] & never did his short-term disability paperwork.  Also, apparently his Medicaid application was denied. He  has run out of some of his medications.   Probably as a result of deconditioning, he notes that he'll get short of breath after 20 minutes of activity. He doesn't really notice any exertional chest  tightness or pressure. Minimal edema. He does note 2-3 pillow orthopnea on occasion and occasional PND.  He also notes that his "belly" has increased in size -- which appears to be more related to gaining weight with fat as opposed to fluid.     No chest pain or shortness of breath@ rest or CP with rest or exertion.  No palpitations, lightheadedness, dizziness, weakness or syncope/near syncope. No TIA/amaurosis fugax symptoms. No melena, hematochezia, hematuria, or epstaxis. No claudication.  ROS: A comprehensive was performed. Review of Systems  Constitutional: Positive for malaise/fatigue.  Eyes: Negative for blurred vision.  Respiratory: Negative for cough and wheezing.   Musculoskeletal: Positive for joint pain. Negative for myalgias and falls.  Neurological: Positive for headaches. Negative for dizziness.  Endo/Heme/Allergies: Bruises/bleeds easily.  Psychiatric/Behavioral: Positive for depression. The patient is nervous/anxious.   All other systems reviewed and are negative.   Past Medical History  Diagnosis Date  . Congenital bicuspid aortic valve 09/2014    - s/p AVR  . Severe aortic stenosis 10/04/2014    Presented with Syncope & CHF  . Nonischemic cardiomyopathy (HCC) -- Resolved[I42.9] 09/2014    EF by Echo 20-25% (pre-op AVR) --> Echo 10/2014 & 03/2015: EF 50-55% - also Recent Normal Diastolic parameters   . Third degree heart block (HCC) 10/04/2014    Transient CHB related to AS -- s/p PPM  . S/P AVR (aortic valve replacement) 10/2014    21 mm Carbometrics Mechanical Valve -- Epicardial PPM Leads    Past Surgical History  Procedure Laterality Date  . Surgery scrotal / testicular      for undescended testicle as a child  . Cardiac catheterization N/A 10/04/2014    Procedure: Left Heart Cath and Coronary Angiography;  Surgeon: Marykay Lex, MD;  Location: [facility] INVASIVE CV LAB;  Service: Cardiovascular;  Laterality: N/A;  . Cardiac catheterization  10/04/2014    Procedure:  Temporary Pacemaker;  Surgeon: Marykay Lex, MD;  Location: [facility] INVASIVE CV LAB;  Service: Cardiovascular;;  . Cardiac catheterization N/A 11/25/2014    Procedure: Temporary Pacemaker;  Surgeon: Marinus Maw, MD;  Location: [facility] INVASIVE CV LAB;  Service: Cardiovascular;  Laterality: N/A;  . Multiple extractions with alveoloplasty N/A 11/25/2014    Procedure: Extraction of tooth #'s 1,2,3,4,5,6,7,9, 10, 11, 12,13,14,16, 19, 20, 21, 22, 23, 24, 25, 26, 27, 28, 29, 30, 31 with alveoloplasty;  Surgeon: Charlynne Pander, DDS;  Location: [facility] OR;  Service: Oral Surgery;  Laterality: N/A;  . Aortic valve replacement N/A 12/01/2014    Procedure: AORTIC VALVE REPLACEMENT (AVR);  Surgeon: Kerin Perna, MD;  Location: [facility] OR;  Service: Open Heart Surgery;  Laterality: N/A;  . Tee without cardioversion N/A 12/01/2014    Procedure: TRANSESOPHAGEAL ECHOCARDIOGRAM (TEE);  Surgeon: Kerin Perna, MD;  Location: [facility] OR;  Service: Open Heart Surgery;  Laterality: N/A;  . Transthoracic echocardiogram  03/2015    Mildly dilated LV. EF 50 at 55%. Normal diastolic parameters. Mild to moderate stenosis of prosthetic aortic valve. Mild to moderate regurgitation   Prior to Admission medications   Medication Sig Start Date End Date Taking? Authorizing Provider  aspirin EC 81 MG EC tablet Take 1 tablet (81 mg total) by mouth daily. 10/07/14   Ejiroghene Wendall Stade, MD  atorvastatin (LIPITOR) 40 MG tablet Take 1 tablet (40 mg total) by mouth daily. 12/08/14   Donielle Margaretann Loveless, PA-C  furosemide (LASIX) 20 MG tablet Take one tablet by mouth every other day 03/23/15   Duke Salvia, MD  metoprolol tartrate (LOPRESSOR) 25 MG tablet Take 0.5 tablets (12.5 mg total) by mouth 2 (two) times daily. 03/09/15   Marykay Lex, MD  oxyCODONE (OXY IR/ROXICODONE) 5 MG immediate release tablet Take 1 tablet (5 mg total) by mouth every 6 (six) hours as needed for severe pain. 01/13/15   Kerin Perna, MD  warfarin (COUMADIN) 5 MG tablet  Take as directed by coumadin clinic 02/05/15   Duke Salvia, MD   No Known Allergies   Social History   Social History  . Marital Status: Single    Spouse Name: N/A  . Number of Children: N/A  . Years of Education: N/A   Occupational History  . 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    Social History Main Topics  . Smoking status: Former Smoker -- 2.00 packs/day for 20 years    Types: Cigarettes  . Smokeless tobacco: Never Used     Comment: quit 10/04/14  . Alcohol Use: 0.0 oz/week    0 Standard drinks or equivalent per week     Comment: rarely  . Drug Use: No  . Sexual Activity: Yes   Other Topics Concern  . None   Social History Narrative   Lives in [place], [place] with fiance. No history of CAD or PPM, arrhythmia in parents or siblings. 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    Family History  Problem Relation Age of Onset  . CAD Neg Hx   . Hypertension Neg Hx     Wt Readings from Last 3 Encounters:  04/14/15 244 lb 12 oz (111.018 kg)  03/23/15 237 lb (107.502 kg)  03/17/15 240 lb (108.863 kg)  -- gaining weight.  Not as active =-was 89 in May - pre-op.  PHYSICAL EXAM BP 120/70 mmHg  Pulse 67  Ht  (1.88 m)  Wt 244 lb 12 oz (111.018 kg)  BMI 31.41 kg/m2 General appearance: alert, cooperative, appears stated age and no distress HEENT: [place]/AT, EOMI, MMM, anicteric sclera; essentially edentulous now Neck: no adenopathy, no carotid bruit, no JVD, supple, symmetrical, trachea midline and thyroid not enlarged, symmetric, no tenderness/mass/nodules Lungs: clear to auscultation bilaterally, normal percussion bilaterally and Nonlabored, good air movement Heart: RRR, normal S1 - metallic S2. 2/6 mid to late peaking C-D SEM at RUSB --> carotids, soft HSM @ apex Abdomen: soft, non-tender; bowel sounds normal; no masses, no organomegaly Extremities: extremities normal, atraumatic, no cyanosis or edema Pulses: 2+ and symmetric Skin: Skin color, texture, turgor  normal. No rashes or lesions Neurologic: Alert and oriented X 3, normal strength and tone. Normal symmetric reflexes. Normal coordination and gait Mental status: Alert, oriented, thought content appropriate, affect: blunted Cranial nerves: normal   Adult ECG Report  Rate: 67 ;  Rhythm: a sense, V. paced;   Narrative Interpretation: stable EKG   Other studies Reviewed: Additional studies/ records that were reviewed today include:  Recent Labs:   Lab Results  Component Value Date   CHOL 178 11/22/2014   HDL 31* 11/22/2014   LDLCALC 89 11/22/2014   TRIG 290* 11/22/2014   CHOLHDL 5.7 11/22/2014   Lab Results  Component Value Date   CREATININE 0.79 03/23/2015     ASSESSMENT / PLAN: Problem List Items Addressed  This Visit    Third degree heart block (HCC) (Chronic)    He had treated to 30 AV block initially and then when he presented with syncope thought to be combination of 30 AV block as well as worsening or stenosis. He now status post pacemaker placement. Unfortunately he is rapidly going through his battery life of pacemaker because of the difficulty with epicardial pacing leads. Being monitored by Dr. Graciela Husbands. He seems to be tolerating metoprolol and essentially AV paced      Relevant Medications   furosemide (LASIX) 40 MG tablet   atorvastatin (LIPITOR) 20 MG tablet   S/P AVR - Primary (Chronic)    Valve appears to be well-seated, but there is some regurgitation and mild stenosis. The stenosis is probably related to valve size. We'll probably recheck annual echoes. On warfarin      Nonischemic cardiomyopathy-EF 50-55% echo 11/20/14 (Chronic)    Postoperative LV function is improved almost back to normal. No real systolic heart failure from an EF of 50 and 55%. He is on low-dose beta blocker. Does not have a lot of blood pressure room to use an ACE inhibitor or ARB. We continued to monitor and try to get him on an ARB Or Ace if his pressure would tolerate.      Relevant  Medications   furosemide (LASIX) 40 MG tablet   atorvastatin (LIPITOR) 20 MG tablet   Chronic diastolic heart failure (HCC) (Chronic)    He is taking none intermittent furosemide and is having some symptoms of orthopnea. Doesn't really sound like he is having maxillary heart failure issues. I think a lot of his exertional dyspnea is because of deconditioning and all the weight gain. It does not appear to be volume overload. I told him that if his edema is worse to take the Lasix daily. Especially if he has any orthopnea or PND the previous night.      Relevant Medications   furosemide (LASIX) 40 MG tablet   atorvastatin (LIPITOR) 20 MG tablet   Chest pain    He has some chest wall related musculoskeletal pain postoperatively.      Relevant Orders   EKG 12-Lead (Completed)   Cardiac pacemaker in situ (Chronic)    Other Visit Diagnoses    Shortness of breath        Relevant Orders    EKG 12-Lead (Completed)       Current medicines are reviewed at length with the patient today. (+/- concerns) none The following changes have been made: okay to take extra dose of Lasix. Can reduce Lipitor to 20 mg.  Studies Ordered:   Orders Placed This Encounter  Procedures  . EKG 12-Lead   I spent 45 minutes with the patient discussing everything that happened from last time I saw him which was in evaluations and procedures. Apparently he never did have paperwork filled out for short-term disability for her initiation of long-term disability paperwork. He was concerned about not having Medicaid paperwork and not getting into the dental over half the time seen the patient was discussing these issues. I then also spent well over 40 minutes reviewing the chart over the last 6 months.   followup 3 months  Nataley Bahri, Nicholas Horton, M.D., M.S. Interventional Cardiologist   Pager # [phone]

## 2015-04-14 NOTE — Telephone Encounter (Signed)
I am scheduled to see him today -  Unfortunately - have not seen him since surgery.   Not too sure how I can justify disability for same reasons as Dr. Graciela Husbands indicated.  Marykay Lex, MD

## 2015-04-16 ENCOUNTER — Encounter: Payer: Self-pay | Admitting: Cardiology

## 2015-04-16 NOTE — Assessment & Plan Note (Signed)
Postoperative LV function is improved almost back to normal. No real systolic heart failure from an EF of 50 and 55%. He is on low-dose beta blocker. Does not have a lot of blood pressure room to use an ACE inhibitor or ARB. We continued to monitor and try to get him on an ARB Or Ace if his pressure would tolerate.

## 2015-04-16 NOTE — Assessment & Plan Note (Signed)
He has some chest wall related musculoskeletal pain postoperatively.

## 2015-04-16 NOTE — Assessment & Plan Note (Signed)
He had treated to 30 AV block initially and then when he presented with syncope thought to be combination of 30 AV block as well as worsening or stenosis. He now status post pacemaker placement. Unfortunately he is rapidly going through his battery life of pacemaker because of the difficulty with epicardial pacing leads. Being monitored by Dr. Graciela Husbands. He seems to be tolerating metoprolol and essentially AV paced

## 2015-04-16 NOTE — Assessment & Plan Note (Signed)
He is taking none intermittent furosemide and is having some symptoms of orthopnea. Doesn't really sound like he is having maxillary heart failure issues. I think a lot of his exertional dyspnea is because of deconditioning and all the weight gain. It does not appear to be volume overload. I told him that if his edema is worse to take the Lasix daily. Especially if he has any orthopnea or PND the previous night.

## 2015-04-16 NOTE — Assessment & Plan Note (Signed)
Valve appears to be well-seated, but there is some regurgitation and mild stenosis. The stenosis is probably related to valve size. We'll probably recheck annual echoes. On warfarin

## 2015-04-28 ENCOUNTER — Ambulatory Visit (INDEPENDENT_AMBULATORY_CARE_PROVIDER_SITE_OTHER): Payer: Self-pay | Admitting: *Deleted

## 2015-04-28 DIAGNOSIS — Z954 Presence of other heart-valve replacement: Secondary | ICD-10-CM

## 2015-04-28 DIAGNOSIS — Z952 Presence of prosthetic heart valve: Secondary | ICD-10-CM

## 2015-04-28 DIAGNOSIS — Z5181 Encounter for therapeutic drug level monitoring: Secondary | ICD-10-CM

## 2015-04-28 LAB — POCT INR: INR: 2.7

## 2015-05-12 ENCOUNTER — Ambulatory Visit: Payer: Self-pay | Admitting: Cardiology

## 2015-05-27 ENCOUNTER — Other Ambulatory Visit: Payer: Self-pay | Admitting: Internal Medicine

## 2015-06-02 ENCOUNTER — Ambulatory Visit (INDEPENDENT_AMBULATORY_CARE_PROVIDER_SITE_OTHER): Payer: Self-pay

## 2015-06-02 DIAGNOSIS — Z952 Presence of prosthetic heart valve: Secondary | ICD-10-CM

## 2015-06-02 DIAGNOSIS — Z5181 Encounter for therapeutic drug level monitoring: Secondary | ICD-10-CM

## 2015-06-02 DIAGNOSIS — Z954 Presence of other heart-valve replacement: Secondary | ICD-10-CM

## 2015-06-02 LAB — POCT INR: INR: 2.1

## 2015-06-13 ENCOUNTER — Encounter: Payer: Self-pay | Admitting: Emergency Medicine

## 2015-06-13 ENCOUNTER — Emergency Department: Payer: Self-pay

## 2015-06-13 DIAGNOSIS — J069 Acute upper respiratory infection, unspecified: Secondary | ICD-10-CM | POA: Insufficient documentation

## 2015-06-13 DIAGNOSIS — Z87891 Personal history of nicotine dependence: Secondary | ICD-10-CM | POA: Insufficient documentation

## 2015-06-13 DIAGNOSIS — Z79899 Other long term (current) drug therapy: Secondary | ICD-10-CM | POA: Insufficient documentation

## 2015-06-13 DIAGNOSIS — G8918 Other acute postprocedural pain: Secondary | ICD-10-CM | POA: Insufficient documentation

## 2015-06-13 DIAGNOSIS — Z950 Presence of cardiac pacemaker: Secondary | ICD-10-CM | POA: Insufficient documentation

## 2015-06-13 DIAGNOSIS — Z7901 Long term (current) use of anticoagulants: Secondary | ICD-10-CM | POA: Insufficient documentation

## 2015-06-13 DIAGNOSIS — Z7982 Long term (current) use of aspirin: Secondary | ICD-10-CM | POA: Insufficient documentation

## 2015-06-13 LAB — CBC
HCT: 45.8 % (ref 40.0–52.0)
Hemoglobin: 15.3 g/dL (ref 13.0–18.0)
MCH: 29.8 pg (ref 26.0–34.0)
MCHC: 33.4 g/dL (ref 32.0–36.0)
MCV: 89.2 fL (ref 80.0–100.0)
PLATELETS: 162 10*3/uL (ref 150–440)
RBC: 5.14 MIL/uL (ref 4.40–5.90)
RDW: 14 % (ref 11.5–14.5)
WBC: 8 10*3/uL (ref 3.8–10.6)

## 2015-06-13 LAB — BASIC METABOLIC PANEL
Anion gap: 8 (ref 5–15)
BUN: 18 mg/dL (ref 6–20)
CHLORIDE: 106 mmol/L (ref 101–111)
CO2: 24 mmol/L (ref 22–32)
Calcium: 8.8 mg/dL — ABNORMAL LOW (ref 8.9–10.3)
Creatinine, Ser: 0.75 mg/dL (ref 0.61–1.24)
GFR calc Af Amer: >60 mL/min (ref >60)
GFR calc non Af Amer: >60 mL/min (ref >60)
Glucose, Bld: 154 mg/dL — ABNORMAL HIGH (ref 65–99)
POTASSIUM: 3.5 mmol/L (ref 3.5–5.1)
SODIUM: 138 mmol/L (ref 135–145)

## 2015-06-13 LAB — TROPONIN I: Troponin I: <0.03 ng/mL (ref <0.031)

## 2015-06-13 NOTE — ED Notes (Signed)
Patient ambulatory to triage with steady gait, without difficulty or distress noted; pt reports diarrhea, weakness today, left sided CP radiating to mid upper back; pt with pacemaker

## 2015-06-14 ENCOUNTER — Emergency Department
Admission: EM | Admit: 2015-06-14 | Discharge: 2015-06-14 | Disposition: A | Discharge status: Home / Self Care | Payer: Self-pay | Attending: Emergency Medicine | Admitting: Emergency Medicine

## 2015-06-14 DIAGNOSIS — J069 Acute upper respiratory infection, unspecified: Secondary | ICD-10-CM

## 2015-06-14 LAB — PROTIME-INR
INR: 2.24
Prothrombin Time: 24.6 seconds — ABNORMAL HIGH (ref 11.4–15.0)

## 2015-06-14 MED ORDER — RANITIDINE HCL 150 MG PO CAPS: 150.0000 mg | ORAL_CAPSULE | Freq: Two times a day (BID) | ORAL | Status: DC

## 2015-06-14 MED ORDER — ONDANSETRON 8 MG PO TBDP: 8.0000 mg | ORAL_TABLET | Freq: Three times a day (TID) | ORAL | Status: DC | PRN

## 2015-06-14 MED ORDER — DICYCLOMINE HCL 20 MG PO TABS: 20.0000 mg | ORAL_TABLET | Freq: Three times a day (TID) | ORAL | Status: DC | PRN

## 2015-06-14 NOTE — Discharge Instructions (Signed)
Upper Respiratory Infection, Adult Most upper respiratory infections (URIs) are a viral infection of the air passages leading to the lungs. A URI affects the nose, throat, and upper air passages. The most common type of URI is nasopharyngitis and is typically referred to as "the common cold." URIs run their course and usually go away on their own. Most of the time, a URI does not require medical attention, but sometimes a bacterial infection in the upper airways can follow a viral infection. This is called a secondary infection. Sinus and middle ear infections are common types of secondary upper respiratory infections. Bacterial pneumonia can also complicate a URI. A URI can worsen asthma and chronic obstructive pulmonary disease (COPD). Sometimes, these complications can require emergency medical care and may be life threatening.  CAUSES Almost all URIs are caused by viruses. A virus is a type of germ and can spread from one person to another.  RISKS FACTORS You may be at risk for a URI if:   You smoke.   You have chronic heart or lung disease.  You have a weakened defense (immune) system.   You are very young or very old.   You have nasal allergies or asthma.  You work in crowded or poorly ventilated areas.  You work in health care facilities or schools. SIGNS AND SYMPTOMS  Symptoms typically develop 2-3 days after you come in contact with a cold virus. Most viral URIs last 7-10 days. However, viral URIs from the influenza virus (flu virus) can last 14-18 days and are typically more severe. Symptoms may include:   Runny or stuffy (congested) nose.   Sneezing.   Cough.   Sore throat.   Headache.   Fatigue.   Fever.   Loss of appetite.   Pain in your forehead, behind your eyes, and over your cheekbones (sinus pain).  Muscle aches.  DIAGNOSIS  Your health care provider may diagnose a URI by:  Physical exam.  Tests to check that your symptoms are not due to  another condition such as:  Strep throat.  Sinusitis.  Pneumonia.  Asthma. TREATMENT  A URI goes away on its own with time. It cannot be cured with medicines, but medicines may be prescribed or recommended to relieve symptoms. Medicines may help:  Reduce your fever.  Reduce your cough.  Relieve nasal congestion. HOME CARE INSTRUCTIONS   Take medicines only as directed by your health care provider.   Gargle warm saltwater or take cough drops to comfort your throat as directed by your health care provider.  Use a warm mist humidifier or inhale steam from a shower to increase air moisture. This may make it easier to breathe.  Drink enough fluid to keep your urine clear or pale yellow.   Eat soups and other clear broths and maintain good nutrition.   Rest as needed.   Return to work when your temperature has returned to normal or as your health care provider advises. You may need to stay home longer to avoid infecting others. You can also use a face mask and careful hand washing to prevent spread of the virus.  Increase the usage of your inhaler if you have asthma.   Do not use any tobacco products, including cigarettes, chewing tobacco, or electronic cigarettes. If you need help quitting, ask your health care provider. PREVENTION  The best way to protect yourself from getting a cold is to practice good hygiene.   Avoid oral or hand contact with people with cold   symptoms.   Wash your hands often if contact occurs.  There is no clear evidence that vitamin C, vitamin E, echinacea, or exercise reduces the chance of developing a cold. However, it is always recommended to get plenty of rest, exercise, and practice good nutrition.  SEEK MEDICAL CARE IF:   You are getting worse rather than better.   Your symptoms are not controlled by medicine.   You have chills.  You have worsening shortness of breath.  You have brown or red mucus.  You have yellow or brown nasal  discharge.  You have pain in your face, especially when you bend forward.  You have a fever.  You have swollen neck glands.  You have pain while swallowing.  You have white areas in the back of your throat. SEEK IMMEDIATE MEDICAL CARE IF:   You have severe or persistent:  Headache.  Ear pain.  Sinus pain.  Chest pain.  You have chronic lung disease and any of the following:  Wheezing.  Prolonged cough.  Coughing up blood.  A change in your usual mucus.  You have a stiff neck.  You have changes in your:  Vision.  Hearing.  Thinking.  Mood. MAKE SURE YOU:   Understand these instructions.  Will watch your condition.  Will get help right away if you are not doing well or get worse.   This information is not intended to replace advice given to you by your health care provider. Make sure you discuss any questions you have with your health care provider.   Document Released: 11/08/2000 Document Revised: 09/29/2014 Document Reviewed: 08/20/2013 Elsevier Interactive Patient Education 2016 [employer]  

## 2015-06-14 NOTE — ED Notes (Signed)
Pt has an aortic valve replacement.

## 2015-06-14 NOTE — ED Provider Notes (Signed)
[facility] Emergency Department Provider Note  ____________________________________________  Time seen: 1:15 AM  I have reviewed the triage vital signs and the nursing notes.   HISTORY  Chief Complaint Chest Pain    HPI Nicholas Horton is a 40 y.o. male who complains of diarrhea and generalized weakness that started yesterday with some generalized headaches and sore throat as well. He reports that his wife was sick with similar symptoms starting a few days ago. Denies any nausea or vomiting. does have a mild nonproductive cough. Does also report thathis scars on the chest from previous heart surgery and pacemaker placement are itching and painful. No other significant chest pain. No exertional or pleuritic symptoms. Plan with medical therapy.     Past Medical History  Diagnosis Date  . Congenital bicuspid aortic valve 09/2014    - s/p AVR  . Severe aortic stenosis 10/04/2014    Presented with Syncope & CHF  . Nonischemic cardiomyopathy (HCC) -- Resolved[I42.9] 09/2014    EF by Echo 20-25% (pre-op AVR) --> Echo 10/2014 & 03/2015: EF 50-55% - also Recent Normal Diastolic parameters   . Third degree heart block (HCC) 10/04/2014    Transient CHB related to AS -- s/p PPM  . S/P AVR (aortic valve replacement) 10/2014    21 mm Carbometrics Mechanical Valve -- Epicardial PPM Leads     Patient Active Problem List   Diagnosis Date Noted  . Chronic diastolic heart failure (HCC) 04/14/2015  . Encounter for therapeutic drug monitoring 12/16/2014  . S/P AVR 12/01/2014  . AS (aortic stenosis)   . Complete heart block (HCC) 11/23/2014  . E. Coli UTI 11/23/2014  . Chest pain 11/21/2014  . Syncope 11/21/2014  . Paradentosis 11/21/2014  . Cardiac pacemaker in situ 11/12/2014  . Nonischemic cardiomyopathy-EF 50-55% echo 11/20/14 10/16/2014  . Tobacco use disorder 10/07/2014  . Acute respiratory failure (HCC) 10/07/2014  . Bicuspid aortic valve   . NSTEMI, initial  episode of care (HCC) 10/04/2014  . Third degree heart block (HCC) 10/04/2014  . Hypoxia 10/04/2014    Class: Acute  . Dyspnea 10/04/2014  . Moderate to severe AS with bicuspid valve. Mean 33, peak 61 mmHg. 10/04/2014     Past Surgical History  Procedure Laterality Date  . Surgery scrotal / testicular      for undescended testicle as a child  . Cardiac catheterization N/A 10/04/2014    Procedure: Left Heart Cath and Coronary Angiography;  Surgeon: Marykay Lex, MD;  Location: [facility] INVASIVE CV LAB;  Service: Cardiovascular;  Laterality: N/A;  . Cardiac catheterization  10/04/2014    Procedure: Temporary Pacemaker;  Surgeon: Marykay Lex, MD;  Location: [facility] INVASIVE CV LAB;  Service: Cardiovascular;;  . Cardiac catheterization N/A 11/25/2014    Procedure: Temporary Pacemaker;  Surgeon: Marinus Maw, MD;  Location: [facility] INVASIVE CV LAB;  Service: Cardiovascular;  Laterality: N/A;  . Multiple extractions with alveoloplasty N/A 11/25/2014    Procedure: Extraction of tooth #'s 1,2,3,4,5,6,7,9, 10, 11, 12,13,14,16, 19, 20, 21, 22, 23, 24, 25, 26, 27, 28, 29, 30, 31 with alveoloplasty;  Surgeon: Charlynne Pander, DDS;  Location: [facility] OR;  Service: Oral Surgery;  Laterality: N/A;  . Aortic valve replacement N/A 12/01/2014    Procedure: AORTIC VALVE REPLACEMENT (AVR);  Surgeon: Kerin Perna, MD;  Location: [facility] OR;  Service: Open Heart Surgery;  Laterality: N/A;  . Tee without cardioversion N/A 12/01/2014    Procedure: TRANSESOPHAGEAL ECHOCARDIOGRAM (TEE);  Surgeon: [name],  MD;  Location: [facility] OR;  Service: Open Heart Surgery;  Laterality: N/A;  . Transthoracic echocardiogram  03/2015    Mildly dilated LV. EF 50 at 55%. Normal diastolic parameters. Mild to moderate stenosis of prosthetic aortic valve. Mild to moderate regurgitation     Current Outpatient Rx  Name  Route  Sig  Dispense  Refill  . aspirin EC 81 MG EC tablet   Oral   Take 1 tablet (81 mg total) by mouth daily.   30 tablet    1   . atorvastatin (LIPITOR) 20 MG tablet   Oral   Take 1 tablet (20 mg total) by mouth daily.   30 tablet   3   . dicyclomine (BENTYL) 20 MG tablet   Oral   Take 1 tablet (20 mg total) by mouth 3 (three) times daily as needed for spasms.   30 tablet   0   . furosemide (LASIX) 40 MG tablet   Oral   Take 40 mg by mouth.         . metoprolol tartrate (LOPRESSOR) 25 MG tablet   Oral   Take 0.5 tablets (12.5 mg total) by mouth 2 (two) times daily.   30 tablet   3   . ondansetron (ZOFRAN ODT) 8 MG disintegrating tablet   Oral   Take 1 tablet (8 mg total) by mouth every 8 (eight) hours as needed for nausea or vomiting.   20 tablet   0   . ranitidine (ZANTAC) 150 MG capsule   Oral   Take 1 capsule (150 mg total) by mouth 2 (two) times daily.   28 capsule   0   . warfarin (COUMADIN) 5 MG tablet      TAKE AS DIRECTED BY COUMADIN CLINIC   40 tablet   3      Allergies Review of patient's allergies indicates no known allergies.   Family History  Problem Relation Age of Onset  . CAD Neg Hx   . Hypertension Neg Hx     Social History Social History  Substance Use Topics  . Smoking status: Former Smoker -- 2.00 packs/day for 20 years    Types: Cigarettes  . Smokeless tobacco: Never Used     Comment: quit 10/04/14  . Alcohol Use: 0.0 oz/week    0 Standard drinks or equivalent per week     Comment: rarely    Review of Systems  Constitutional:   No fever or chills. No weight changes Eyes:   No blurry vision or double vision.  ENT:   Positive sore throat. Cardiovascular:   No chest pain. Respiratory:   No dyspnea positive cough. Gastrointestinal:   Negative for abdominal pain, vomiting positive diarrhea.  No BRBPR or melena. Genitourinary:   Negative for dysuria, urinary retention, bloody urine, or difficulty urinating. Musculoskeletal:   Negative for back pain. No joint swelling or pain. Skin:   Negative for rash. Neurological:   Positive for headaches  without focal weakness or numbness. Psychiatric:  No anxiety or depression.   Endocrine:  No hot/cold intolerance, changes in energy, or sleep difficulty.  10-point ROS otherwise negative.  ____________________________________________   PHYSICAL EXAM:  VITAL SIGNS: ED Triage Vitals  Enc Vitals Group     BP 06/13/15 2303 133/84 mmHg     Pulse Rate 06/13/15 2303 91     Resp 06/13/15 2303 20     Temp 06/13/15 2303 97.7 F (36.5 C)     Temp Source 06/13/15 2303 Oral  SpO2 06/13/15 2303 95 %     Weight 06/13/15 2303 240 lb (108.863 kg)     Height 06/13/15 2303  (1.88 m)     Head Cir --      Peak Flow --      Pain Score 06/13/15 2302 10     Pain Loc --      Pain Edu? --      Excl. in GC? --     Vital signs reviewed, nursing assessments reviewed.   Constitutional:   Alert and oriented. Well appearing and in no distress. Eyes:   No scleral icterus. No conjunctival pallor. PERRL. EOMI ENT   Head:   Normocephalic and atraumatic.   Nose:   No congestion/rhinnorhea. No septal hematoma   Mouth/Throat:   MMM, mild pharyngeal erythema. No peritonsillar mass. No uvula shift.   Neck:   No stridor. No SubQ emphysema. No meningismus. Hematological/Lymphatic/Immunilogical:   No cervical lymphadenopathy. Cardiovascular:   RRR. Normal and symmetric distal pulses are present in all extremities. No murmurs, rubs, or gallops. Respiratory:   Normal respiratory effort without tachypnea nor retractions. Breath sounds are clear and equal bilaterally. No wheezes/rales/rhonchi. Gastrointestinal:   Soft and nontender. No distention. There is no CVA tenderness.  No rebound, rigidity, or guarding. Genitourinary:   deferred Musculoskeletal:   Nontender with normal range of motion in all extremities. No joint effusions.  No lower extremity tenderness.  No edema. Neurologic:   Normal speech and language.  CN 2-10 normal. Motor grossly intact. No pronator drift.  Normal gait. No  gross focal neurologic deficits are appreciated.  Skin:    Skin is warm, dry and intact. No rash noted.  No petechiae, purpura, or bullae. Hypertrophic scars on the anterior chest wall Psychiatric:   Mood and affect are normal. Speech and behavior are normal. Patient exhibits appropriate insight and judgment.  ____________________________________________    LABS (pertinent positives/negatives) (all labs ordered are listed, but only abnormal results are displayed) Labs Reviewed  BASIC METABOLIC PANEL - Abnormal; Notable for the following:    Glucose, Bld 154 (*)    Calcium 8.8 (*)    All other components within normal limits  PROTIME-INR - Abnormal; Notable for the following:    Prothrombin Time 24.6 (*)    All other components within normal limits  CBC  TROPONIN I   ____________________________________________   EKG  Interpreted by me Paced rhythm rate of 89, right axis, normal intervals. Right bundle branch block. No acute ischemic changes.  ____________________________________________    RADIOLOGY  Chest x-ray unremarkable  ____________________________________________   PROCEDURES   ____________________________________________   INITIAL IMPRESSION / ASSESSMENT AND PLAN / ED COURSE  Pertinent labs & imaging results that were available during my care of the patient were reviewed by me and considered in my medical decision making (see chart for details).  Patient presents with symptoms consistent with a viral upper respiratory illness.Considering the patient's symptoms, medical history, and physical examination today, I have low suspicion for ACS, PE, TAD, pneumothorax, carditis, mediastinitis, pneumonia, CHF, or sepsis.  No suspicion for meningitis or encephalitis or other bacterial illness. Troponin negative, INR within goal. Other labs EKG chest x-ray unremarkable. Follow-up primary care. Prescriptions for symptomatic relief including Zantac Bentyl and  Zofran.     ____________________________________________   FINAL CLINICAL IMPRESSION(S) / ED DIAGNOSES  Final diagnoses:  Acute URI      Sharman Cheek, MD 06/14/15 [phone]

## 2015-06-22 ENCOUNTER — Ambulatory Visit (INDEPENDENT_AMBULATORY_CARE_PROVIDER_SITE_OTHER): Payer: Self-pay | Admitting: *Deleted

## 2015-06-22 ENCOUNTER — Telehealth: Payer: Self-pay | Admitting: Cardiology

## 2015-06-22 DIAGNOSIS — I442 Atrioventricular block, complete: Secondary | ICD-10-CM

## 2015-06-22 NOTE — Telephone Encounter (Signed)
LMOVM reminding pt to send remote transmission.   

## 2015-06-23 ENCOUNTER — Telehealth: Payer: Self-pay

## 2015-06-23 NOTE — Progress Notes (Signed)
Remote pacemaker transmission.   

## 2015-06-23 NOTE — Telephone Encounter (Signed)
Received records request Disability Determination Services, forwarded to [employer] for processing.  

## 2015-06-28 LAB — CUP PACEART REMOTE DEVICE CHECK
Brady Statistic AP VS Percent: 0.01 %
Brady Statistic AS VP Percent: 81.56 %
Brady Statistic RA Percent Paced: 18.17 %
Brady Statistic RV Percent Paced: 99.73 %
Implantable Lead Implant Date: 20160705
Implantable Lead Implant Date: 20160705
Implantable Lead Location: 753858
Implantable Lead Location: 753859
Implantable Lead Model: 5071
Lead Channel Impedance Value: 361 Ohm
Lead Channel Impedance Value: 4047 Ohm
Lead Channel Impedance Value: 4047 Ohm
Lead Channel Impedance Value: 4047 Ohm
Lead Channel Impedance Value: 532 Ohm
Lead Channel Impedance Value: 836 Ohm
Lead Channel Pacing Threshold Amplitude: 2.375 V
Lead Channel Sensing Intrinsic Amplitude: 12.25 mV
Lead Channel Sensing Intrinsic Amplitude: 4.5 mV
MDC IDC LEAD IMPLANT DT: 20160705
MDC IDC LEAD LOCATION: 753860
MDC IDC MSMT BATTERY REMAINING LONGEVITY: 22 mo
MDC IDC MSMT BATTERY VOLTAGE: 2.97 V
MDC IDC MSMT LEADCHNL LV IMPEDANCE VALUE: 4047 Ohm
MDC IDC MSMT LEADCHNL LV IMPEDANCE VALUE: 4047 Ohm
MDC IDC MSMT LEADCHNL LV PACING THRESHOLD AMPLITUDE: 1.875 V
MDC IDC MSMT LEADCHNL LV PACING THRESHOLD PULSEWIDTH: 1 ms
MDC IDC MSMT LEADCHNL RA PACING THRESHOLD AMPLITUDE: 0.625 V
MDC IDC MSMT LEADCHNL RA PACING THRESHOLD PULSEWIDTH: 0.4 ms
MDC IDC MSMT LEADCHNL RA SENSING INTR AMPL: 4.5 mV
MDC IDC MSMT LEADCHNL RV IMPEDANCE VALUE: 437 Ohm
MDC IDC MSMT LEADCHNL RV PACING THRESHOLD PULSEWIDTH: 0.4 ms
MDC IDC MSMT LEADCHNL RV SENSING INTR AMPL: 12.25 mV
MDC IDC SESS DTM: 20170125002409
MDC IDC SET LEADCHNL LV PACING AMPLITUDE: 3 V
MDC IDC SET LEADCHNL LV PACING PULSEWIDTH: 1 ms
MDC IDC SET LEADCHNL RA PACING AMPLITUDE: 1.5 V
MDC IDC SET LEADCHNL RV PACING AMPLITUDE: 5 V
MDC IDC SET LEADCHNL RV PACING PULSEWIDTH: 1 ms
MDC IDC SET LEADCHNL RV SENSING SENSITIVITY: 4 mV
MDC IDC STAT BRADY AP VP PERCENT: 18.16 %
MDC IDC STAT BRADY AS VS PERCENT: 0.26 %

## 2015-06-30 ENCOUNTER — Encounter: Payer: Self-pay | Admitting: Cardiology

## 2015-07-02 ENCOUNTER — Telehealth: Payer: Self-pay

## 2015-07-02 NOTE — Telephone Encounter (Signed)
Received records request Disability Determination Services, forwarded to [employer] for processing.  

## 2015-07-07 ENCOUNTER — Ambulatory Visit (INDEPENDENT_AMBULATORY_CARE_PROVIDER_SITE_OTHER): Payer: Self-pay

## 2015-07-07 ENCOUNTER — Encounter: Payer: Self-pay | Admitting: Cardiology

## 2015-07-07 ENCOUNTER — Ambulatory Visit (INDEPENDENT_AMBULATORY_CARE_PROVIDER_SITE_OTHER): Payer: Self-pay | Admitting: Cardiology

## 2015-07-07 VITALS — BP 120/82 | HR 69 | Ht 74.0 in | Wt 255.2 lb

## 2015-07-07 DIAGNOSIS — Z954 Presence of other heart-valve replacement: Secondary | ICD-10-CM

## 2015-07-07 DIAGNOSIS — Z5181 Encounter for therapeutic drug level monitoring: Secondary | ICD-10-CM

## 2015-07-07 DIAGNOSIS — I5032 Chronic diastolic (congestive) heart failure: Secondary | ICD-10-CM

## 2015-07-07 DIAGNOSIS — Z952 Presence of prosthetic heart valve: Secondary | ICD-10-CM

## 2015-07-07 DIAGNOSIS — Q231 Congenital insufficiency of aortic valve: Secondary | ICD-10-CM

## 2015-07-07 DIAGNOSIS — I428 Other cardiomyopathies: Secondary | ICD-10-CM

## 2015-07-07 DIAGNOSIS — I442 Atrioventricular block, complete: Secondary | ICD-10-CM

## 2015-07-07 DIAGNOSIS — Z950 Presence of cardiac pacemaker: Secondary | ICD-10-CM

## 2015-07-07 DIAGNOSIS — R002 Palpitations: Secondary | ICD-10-CM | POA: Insufficient documentation

## 2015-07-07 DIAGNOSIS — I499 Cardiac arrhythmia, unspecified: Secondary | ICD-10-CM

## 2015-07-07 DIAGNOSIS — I429 Cardiomyopathy, unspecified: Secondary | ICD-10-CM

## 2015-07-07 LAB — POCT INR: INR: 1.4

## 2015-07-07 MED ORDER — METOPROLOL TARTRATE 25 MG PO TABS: 25.0000 mg | ORAL_TABLET | Freq: Two times a day (BID) | ORAL | Status: DC

## 2015-07-07 NOTE — Assessment & Plan Note (Signed)
He had cut back his furosemide to 20 mg as opposed to 40 and occasionally take it twice a day. I recommended since he has had some weight gain here and complains of abdominal girth increase, we may have some volume overload. We will have him increase to 40 mg throughout the rest of this week. Then starting on Monday he will take 40 mg on Monday, Wednesday and Friday with 20 mg on other days.   I'm increasing beta blocker dose, and will consider adding ACE inhibitor or ARB based on blood pressure evaluation. I will also need to reassess his renal function. We will check a BMP in a few weeks after he has been on increased dose of Lasix.

## 2015-07-07 NOTE — Progress Notes (Signed)
PCP: Marykay Lex, MD  Clinic Note: Chief Complaint  Patient presents with  . other    Pt. was at [facility] ER with irreg. heart beats. Meds reviewed by the patient verbally. Pt. c/o shortness of breath and irreg. heart beats.   . Cardiac Valve Problem    HPI: Nicholas Horton is a 40 y.o. male with a PMH below who presents today for 3 month f/u of Valvular CM - s/p Mechanical AVR with PPM for Severe AS & 3rd Deg AVB.  Nicholas Horton was last seen by me on April 14, 2015.  Was still noting fatigue & occasional dyspnea as well as weight gain and chest discomfort.  Notable weight gain -- Dr. Graciela Husbands 10/25 --> relook Echo 11/1  Echo 03/30/15: Technically difficult study. - Left ventricle: The cavity size was mildly dilated. Systolicfunction was normal. EF ~ 50% to 55%.   Regional wall motion abnormalities cannot be excluded.   Left ventricular diastolic function parameters were normal. - Aortic valve: Prosthetic aortic valve present. Transvalvularvelocity was increased. There was mild to moderate stenosis. There was mild to moderate regurgitation. Peak velocity (S): 333 cm/s. Mean gradient (S): 21 mm Hg. Peak gradient (S): 44 mm Hg.Valve area (VTI): 1.19 cm^2. - Left atrium: The atrium was mildly dilated. - Right ventricle: Systolic function was normal. - Pulmonary arteries: Systolic pressure was within the normal range.   Recent Hospitalizations: ER visit on 06/14/2015. He described left upper quadrant discomfort and palpitations, however the ER physician's note states complaints of diarrhea and generalized weakness with headaches and sore throat. He states his wife was more sick than he was.he was actually started on Bentyl for possible GI cramping. This has actually helped the left upper quadrant discomfort. The diarrhea has also come down.  Interval History: Nicholas Horton presents today still  Complaining of exertional dyspnea, and weight gain that he blames on fluid retention. He  also is complaining of the abnormal heartbeats. He had a telephonic evaluation of his pacemaker that revealed a 7 beat run of nonsustained VT. Unfortunately I'm not sure when that was. There was only one episode of NSVT and occasional PVCs noted, but no other arrhythmia.  He does note intermittent palpitations. Despite our talks during the last visit, he continues to be relatively sedentary, and probably is eating less judiciously. He therefore is gained weight. He never ended up being doing cardiac rehabilitation because of the inability to get short-term disability.  Otherwise from a cardiac standpoint, he notes occasional palpitations but nothing sustained. He really denies any chest tightness or pressure with rest or exertion. He does note some exertional dyspnea as well as two-pillow orthopnea. It's more that he notices the dyspnea if he gets up one night to go to the bathroom. He has cut his Lasix down to 20 mg and 40 mg with additional doses when necessary. He describes minimal edema. He thinks that his increased abdominal distention is related to fluid retention.  No chest pain or shortness of breath @ rest or CP with rest or exertion.  Occasional palpitations without associatedlightheadedness, dizziness, weakness or syncope/near syncope. No TIA/amaurosis fugax symptoms. No melena, hematochezia, hematuria, or epstaxis. No claudication.  ROS: A comprehensive was performed. Review of Systems  Constitutional: Positive for malaise/fatigue.  Eyes: Negative for blurred vision.  Respiratory: Negative for cough and wheezing.   Cardiovascular: Positive for orthopnea.  Gastrointestinal: Positive for heartburn, nausea and abdominal pain (Left upper). Negative for vomiting, blood in stool and melena.  Genitourinary: Negative  for hematuria.  Musculoskeletal: Positive for joint pain. Negative for myalgias and falls.  Neurological: Positive for headaches. Negative for dizziness.  Endo/Heme/Allergies:  Bruises/bleeds easily.  Psychiatric/Behavioral: Positive for depression. Negative for memory loss. The patient is nervous/anxious (Not as anxious as he was before). The patient does not have insomnia.   All other systems reviewed and are negative.   Past Medical History  Diagnosis Date  . Congenital bicuspid aortic valve 09/2014    - s/p AVR  . Severe aortic stenosis 10/04/2014    Presented with Syncope & CHF  . Nonischemic cardiomyopathy (HCC) -- Resolved[I42.9] 09/2014    EF by Echo 20-25% (pre-op AVR) --> Echo 10/2014 & 03/2015: EF 50-55% - also Recent Normal Diastolic parameters   . Third degree heart block (HCC) 10/04/2014    Transient CHB related to AS -- s/p PPM  . S/P AVR (aortic valve replacement) 10/2014    21 mm Carbometrics Mechanical Valve -- Epicardial PPM Leads    Past Surgical History  Procedure Laterality Date  . Surgery scrotal / testicular      for undescended testicle as a child  . Cardiac catheterization N/A 10/04/2014    Procedure: Left Heart Cath and Coronary Angiography;  Surgeon: Marykay Lex, MD;  Location: [facility] INVASIVE CV LAB;  Service: Cardiovascular;  Laterality: N/A;  . Cardiac catheterization  10/04/2014    Procedure: Temporary Pacemaker;  Surgeon: Marykay Lex, MD;  Location: [facility] INVASIVE CV LAB;  Service: Cardiovascular;;  . Cardiac catheterization N/A 11/25/2014    Procedure: Temporary Pacemaker;  Surgeon: Marinus Maw, MD;  Location: [facility] INVASIVE CV LAB;  Service: Cardiovascular;  Laterality: N/A;  . Multiple extractions with alveoloplasty N/A 11/25/2014    Procedure: Extraction of tooth #'s 1,2,3,4,5,6,7,9, 10, 11, 12,13,14,16, 19, 20, 21, 22, 23, 24, 25, 26, 27, 28, 29, 30, 31 with alveoloplasty;  Surgeon: Charlynne Pander, DDS;  Location: [facility] OR;  Service: Oral Surgery;  Laterality: N/A;  . Aortic valve replacement N/A 12/01/2014    Procedure: AORTIC VALVE REPLACEMENT (AVR);  Surgeon: Kerin Perna, MD;  Location: [facility] OR;  Service: Open Heart Surgery;   Laterality: N/A;  . Tee without cardioversion N/A 12/01/2014    Procedure: TRANSESOPHAGEAL ECHOCARDIOGRAM (TEE);  Surgeon: Kerin Perna, MD;  Location: [facility] OR;  Service: Open Heart Surgery;  Laterality: N/A;  . Transthoracic echocardiogram  03/2015    Mildly dilated LV. EF 50 at 55%. Normal diastolic parameters. Mild to moderate stenosis of prosthetic aortic valve. Mild to moderate regurgitation   Prior to Admission medications   Medication Sig Start Date End Date Taking? Authorizing Provider  aspirin EC 81 MG EC tablet Take 1 tablet (81 mg total) by mouth daily. 10/07/14   Ejiroghene Wendall Stade, MD  atorvastatin (LIPITOR) 40 MG tablet Take 1 tablet (40 mg total) by mouth daily. 12/08/14   Donielle Margaretann Loveless, PA-C  furosemide (LASIX) 20 MG tablet Take one tablet by mouth every other day 03/23/15   Duke Salvia, MD  metoprolol tartrate (LOPRESSOR) 25 MG tablet Take 0.5 tablets (12.5 mg total) by mouth 2 (two) times daily. 03/09/15   Marykay Lex, MD  oxyCODONE (OXY IR/ROXICODONE) 5 MG immediate release tablet Take 1 tablet (5 mg total) by mouth every 6 (six) hours as needed for severe pain. 01/13/15   Kerin Perna, MD  warfarin (COUMADIN) 5 MG tablet Take as directed by coumadin clinic 02/05/15   Duke Salvia, MD   No Known Allergies  Social History   Social History  . Marital Status: Single    Spouse Name: N/A  . Number of Children: N/A  . Years of Education: N/A   Occupational History  . 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    Social History Main Topics  . Smoking status: Former Smoker -- 2.00 packs/day for 20 years    Types: Cigarettes  . Smokeless tobacco: Never Used     Comment: quit 10/04/14  . Alcohol Use: 0.0 oz/week    0 Standard drinks or equivalent per week     Comment: rarely  . Drug Use: No  . Sexual Activity: Yes   Other Topics Concern  . None   Social History Narrative   Lives in [place], [place] with fiance. No history of CAD or PPM, arrhythmia in parents or  siblings. 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    Family History  Problem Relation Age of Onset  . CAD Neg Hx   . Hypertension Neg Hx     Wt Readings from Last 3 Encounters:  07/07/15 255 lb 4 oz (115.781 kg)  06/13/15 240 lb (108.863 kg)  04/14/15 244 lb 12 oz (111.018 kg)  -- gaining weight.  Not as active =-was 89 in May - pre-op.  PHYSICAL EXAM BP 120/82 mmHg  Pulse 69  Ht 6\' 2"  (1.88 m)  Wt 255 lb 4 oz (115.781 kg)  BMI 32.76 kg/m2 General appearance: alert, cooperative, appears stated age and no distress HEENT: [place]/AT, EOMI, MMM, anicteric sclera; essentially edentulous now Neck: no adenopathy, no carotid bruit, no JVD, supple, symmetrical, trachea midline and thyroid not enlarged, symmetric, no tenderness/mass/nodules Lungs: clear to auscultation bilaterally, normal percussion bilaterally and Nonlabored, good air movement Heart: RRR, normal S1 - metallic S2. 2/6 mid to late peaking C-D SEM at RUSB --> carotids, soft HSM @ apex Abdomen: soft, non-tender; bowel sounds normal; no masses, no organomegaly Extremities: extremities normal, atraumatic, no cyanosis or edema Pulses: 2+ and symmetric Skin: Skin color, texture, turgor normal. No rashes or lesions Neurologic: Alert and oriented X 3, normal strength and tone. Normal symmetric reflexes. Normal coordination and gait Mental status: Alert, oriented, thought content appropriate, affect: blunted Cranial nerves: normal   Adult ECG Report  Rate: 67 ;  Rhythm: a sense, V. paced;   Narrow QRS complex   Narrative Interpretation: stable EKG  Other studies Reviewed: Additional studies/ records that were reviewed today include:  Recent Labs:   Lab Results  Component Value Date   CHOL 178 11/22/2014   HDL 31* 11/22/2014   LDLCALC 89 11/22/2014   TRIG 290* 11/22/2014   CHOLHDL 5.7 11/22/2014   Lab Results  Component Value Date   CREATININE 0.75 06/13/2015    ASSESSMENT / PLAN: Problem List Items  Addressed This Visit    Third degree heart block (HCC) (Chronic)   Relevant Medications   metoprolol tartrate (LOPRESSOR) 25 MG tablet   Other Relevant Orders   EKG 12-Lead (Completed)   S/P AVR (Chronic)    Valve appears to be functioning well based upon recent Echo.  Will need f/u Echo in 1 yr given residual mild AS by gradients. On warfarin for Mechanical valve.      Nonischemic cardiomyopathy-EF 50-55% echo 11/20/14 - Primary (Chronic)    Thankfully, his EF improved after valve surgery. He doesn't really have any significant left ventricular dysfunction besides diastolic dysfunction. He is on low-dose beta blocker which I would increase to 25 twice a day. If his  blood pressure will increase, I would like to add ACE inhibitor or ARB for afterload reduction. We can consider this and follow-up visits.      Relevant Medications   metoprolol tartrate (LOPRESSOR) 25 MG tablet   Other Relevant Orders   EKG 12-Lead (Completed)   Heart palpitations (Chronic)    PVCs noted on PPM  Telephonic evaluation. One brief run of NSVT which was relatively asymptomatic and normal EF. Probably related to his mild cardiomyopathy. I will increase his beta blocker dose.      Chronic diastolic heart failure (HCC) (Chronic)    He had cut back his furosemide to 20 mg as opposed to 40 and occasionally take it twice a day. I recommended since he has had some weight gain here and complains of abdominal girth increase, we may have some volume overload. We will have him increase to 40 mg throughout the rest of this week. Then starting on Monday he will take 40 mg on Monday, Wednesday and Friday with 20 mg on other days.   I'm increasing beta blocker dose, and will consider adding ACE inhibitor or ARB based on blood pressure evaluation. I will also need to reassess his renal function. We will check a BMP in a few weeks after he has been on increased dose of Lasix.      Relevant Medications   metoprolol tartrate  (LOPRESSOR) 25 MG tablet   Cardiac pacemaker in situ (Chronic)    Pacemaker she is to be working well. He does have some itching at this suture line, but no major concerns. He did question the concept of when we will switch from epicardial versus transvenous pacing. I will defer to Dr. Graciela Husbands. I think the plan would be to let this current device reached end-of-life, and then if it needs to be exchanged, they can consider transvenous pacing at that time.      Relevant Orders   EKG 12-Lead (Completed)   Bicuspid aortic valve (Chronic)    Status post aVR.      Relevant Medications   metoprolol tartrate (LOPRESSOR) 25 MG tablet    Other Visit Diagnoses    Irregular heart beats        Relevant Orders    EKG 12-Lead (Completed)       Current medicines are reviewed at length with the patient today. (+/- concerns) none The following changes have been made:  Your physician has recommended you make the following change in your medication:  INCREASE lasix to 40mg  (1 tablet) once a day until Monday, Feb 13 Starting Tuesday, Feb 14, start taking lasix 40mg  (1 tablet) Monday, Wednesday, Friday and 20mg  (1/2 tablet) Sun, Tues, Thurs, Sat. INCREASE metoprolol to 25mg  twice daily  Studies Ordered:   Orders Placed This Encounter  Procedures  . EKG 12-Lead   I spent 30 minutes with the patient discussing everything that happened from last time I saw him which was in evaluations and procedures.  He was concerned about not having Medicaid paperwork.   He is worried about palpitations & NSVT.  Also concerned about weight gain.   follow-up appointment in: July with Dr. Lynda Rainwater, Piedad Climes, M.D., M.S. Interventional Cardiologist   Pager # [phone]

## 2015-07-07 NOTE — Assessment & Plan Note (Signed)
Status post aVR.

## 2015-07-07 NOTE — Assessment & Plan Note (Signed)
Valve appears to be functioning well based upon recent Echo.  Will need f/u Echo in 1 yr given residual mild AS by gradients. On warfarin for Mechanical valve.

## 2015-07-07 NOTE — Assessment & Plan Note (Signed)
Thankfully, his EF improved after valve surgery. He doesn't really have any significant left ventricular dysfunction besides diastolic dysfunction. He is on low-dose beta blocker which I would increase to 25 twice a day. If his blood pressure will increase, I would like to add ACE inhibitor or ARB for afterload reduction. We can consider this and follow-up visits.

## 2015-07-07 NOTE — Patient Instructions (Signed)
Medication Instructions:  Your physician has recommended you make the following change in your medication:  INCREASE lasix to 40mg  (1 tablet) once a day until Monday, Feb 13 Starting Tuesday, Feb 14, start taking lasix 40mg  (1 tablet) Monday, Wednesday, Friday and 20mg  (1/2 tablet) Sun, Tues, Thurs, Sat. INCREASE metoprolol to 25mg  twice daily   Labwork: none  Testing/Procedures: none  Follow-Up: Your physician recommends that you schedule a follow-up appointment in: July with Dr. Herbie Baltimore   Any Other Special Instructions Will Be Listed Below (If Applicable).     If you need a refill on your cardiac medications before your next appointment, please call your pharmacy.

## 2015-07-07 NOTE — Assessment & Plan Note (Signed)
PVCs noted on PPM  Telephonic evaluation. One brief run of NSVT which was relatively asymptomatic and normal EF. Probably related to his mild cardiomyopathy. I will increase his beta blocker dose.

## 2015-07-07 NOTE — Assessment & Plan Note (Signed)
Pacemaker she is to be working well. He does have some itching at this suture line, but no major concerns. He did question the concept of when we will switch from epicardial versus transvenous pacing. I will defer to Dr. Graciela Husbands. I think the plan would be to let this current device reached end-of-life, and then if it needs to be exchanged, they can consider transvenous pacing at that time.

## 2015-07-14 ENCOUNTER — Ambulatory Visit (INDEPENDENT_AMBULATORY_CARE_PROVIDER_SITE_OTHER): Payer: Self-pay

## 2015-07-14 DIAGNOSIS — Z5181 Encounter for therapeutic drug level monitoring: Secondary | ICD-10-CM

## 2015-07-14 DIAGNOSIS — Z952 Presence of prosthetic heart valve: Secondary | ICD-10-CM

## 2015-07-14 DIAGNOSIS — Z954 Presence of other heart-valve replacement: Secondary | ICD-10-CM

## 2015-07-14 LAB — POCT INR: INR: 2.5

## 2015-07-16 DIAGNOSIS — Z736 Limitation of activities due to disability: Secondary | ICD-10-CM

## 2015-08-04 ENCOUNTER — Ambulatory Visit (INDEPENDENT_AMBULATORY_CARE_PROVIDER_SITE_OTHER): Payer: Self-pay

## 2015-08-04 DIAGNOSIS — Z954 Presence of other heart-valve replacement: Secondary | ICD-10-CM

## 2015-08-04 DIAGNOSIS — Z5181 Encounter for therapeutic drug level monitoring: Secondary | ICD-10-CM

## 2015-08-04 DIAGNOSIS — Z952 Presence of prosthetic heart valve: Secondary | ICD-10-CM

## 2015-08-04 LAB — POCT INR: INR: 2.7

## 2015-08-25 ENCOUNTER — Ambulatory Visit (INDEPENDENT_AMBULATORY_CARE_PROVIDER_SITE_OTHER): Payer: Self-pay

## 2015-08-25 DIAGNOSIS — Z5181 Encounter for therapeutic drug level monitoring: Secondary | ICD-10-CM

## 2015-08-25 DIAGNOSIS — Z952 Presence of prosthetic heart valve: Secondary | ICD-10-CM

## 2015-08-25 DIAGNOSIS — Z954 Presence of other heart-valve replacement: Secondary | ICD-10-CM

## 2015-08-25 LAB — POCT INR: INR: 3.2

## 2015-09-15 ENCOUNTER — Ambulatory Visit (INDEPENDENT_AMBULATORY_CARE_PROVIDER_SITE_OTHER): Payer: Self-pay

## 2015-09-15 ENCOUNTER — Telehealth: Payer: Self-pay | Admitting: Cardiology

## 2015-09-15 DIAGNOSIS — Z954 Presence of other heart-valve replacement: Secondary | ICD-10-CM

## 2015-09-15 DIAGNOSIS — Z952 Presence of prosthetic heart valve: Secondary | ICD-10-CM

## 2015-09-15 DIAGNOSIS — Z5181 Encounter for therapeutic drug level monitoring: Secondary | ICD-10-CM

## 2015-09-15 LAB — POCT INR: INR: 1.7

## 2015-09-15 NOTE — Telephone Encounter (Signed)
Pt in office today for anti-coag visit and requests documentation regarding medical history for food stamp application. Letter prepared, signed by Dr. Herbie Baltimore. Contacted pt who states he will pick up letter tomorrow. Letter placed at front desk.

## 2015-09-21 ENCOUNTER — Ambulatory Visit (INDEPENDENT_AMBULATORY_CARE_PROVIDER_SITE_OTHER): Payer: Self-pay | Admitting: *Deleted

## 2015-09-21 ENCOUNTER — Telehealth: Payer: Self-pay | Admitting: Cardiology

## 2015-09-21 DIAGNOSIS — I442 Atrioventricular block, complete: Secondary | ICD-10-CM

## 2015-09-21 DIAGNOSIS — Z950 Presence of cardiac pacemaker: Secondary | ICD-10-CM

## 2015-09-21 NOTE — Progress Notes (Signed)
Remote pacemaker transmission.   

## 2015-09-21 NOTE — Telephone Encounter (Signed)
Spoke with pt and reminded pt of remote transmission that is due today. Pt verbalized understanding.   

## 2015-10-28 LAB — CUP PACEART REMOTE DEVICE CHECK
Battery Voltage: 2.98 V
Brady Statistic AP VP Percent: 21.72 %
Brady Statistic AS VP Percent: 78.21 %
Brady Statistic RA Percent Paced: 21.73 %
Date Time Interrogation Session: 20170425203840
Implantable Lead Implant Date: 20160705
Implantable Lead Implant Date: 20160705
Implantable Lead Location: 753859
Implantable Lead Model: 4968
Implantable Lead Model: 5071
Implantable Lead Model: 5071
Lead Channel Impedance Value: 532 Ohm
Lead Channel Pacing Threshold Amplitude: 2.375 V
Lead Channel Pacing Threshold Pulse Width: 0.4 ms
Lead Channel Sensing Intrinsic Amplitude: 12.25 mV
Lead Channel Sensing Intrinsic Amplitude: 12.25 mV
Lead Channel Setting Pacing Amplitude: 3 V
MDC IDC LEAD IMPLANT DT: 20160705
MDC IDC LEAD LOCATION: 753858
MDC IDC LEAD LOCATION: 753860
MDC IDC MSMT BATTERY REMAINING LONGEVITY: 27 mo
MDC IDC MSMT LEADCHNL LV IMPEDANCE VALUE: 361 Ohm
MDC IDC MSMT LEADCHNL LV IMPEDANCE VALUE: 4047 Ohm
MDC IDC MSMT LEADCHNL LV IMPEDANCE VALUE: 4047 Ohm
MDC IDC MSMT LEADCHNL LV IMPEDANCE VALUE: 4047 Ohm
MDC IDC MSMT LEADCHNL LV IMPEDANCE VALUE: 4047 Ohm
MDC IDC MSMT LEADCHNL LV PACING THRESHOLD AMPLITUDE: 1.875 V
MDC IDC MSMT LEADCHNL LV PACING THRESHOLD PULSEWIDTH: 1 ms
MDC IDC MSMT LEADCHNL RA IMPEDANCE VALUE: 855 Ohm
MDC IDC MSMT LEADCHNL RA PACING THRESHOLD AMPLITUDE: 0.75 V
MDC IDC MSMT LEADCHNL RA PACING THRESHOLD PULSEWIDTH: 0.4 ms
MDC IDC MSMT LEADCHNL RA SENSING INTR AMPL: 4.375 mV
MDC IDC MSMT LEADCHNL RA SENSING INTR AMPL: 4.375 mV
MDC IDC MSMT LEADCHNL RV IMPEDANCE VALUE: 4047 Ohm
MDC IDC MSMT LEADCHNL RV IMPEDANCE VALUE: 494 Ohm
MDC IDC SET LEADCHNL LV PACING PULSEWIDTH: 1 ms
MDC IDC SET LEADCHNL RA PACING AMPLITUDE: 1.5 V
MDC IDC SET LEADCHNL RV PACING AMPLITUDE: 5 V
MDC IDC SET LEADCHNL RV PACING PULSEWIDTH: 1 ms
MDC IDC SET LEADCHNL RV SENSING SENSITIVITY: 4 mV
MDC IDC STAT BRADY AP VS PERCENT: 0.01 %
MDC IDC STAT BRADY AS VS PERCENT: 0.06 %
MDC IDC STAT BRADY RV PERCENT PACED: 99.93 %

## 2015-10-29 ENCOUNTER — Encounter: Payer: Self-pay | Admitting: Cardiology

## 2015-11-24 ENCOUNTER — Ambulatory Visit (INDEPENDENT_AMBULATORY_CARE_PROVIDER_SITE_OTHER): Payer: Self-pay | Admitting: *Deleted

## 2015-11-24 DIAGNOSIS — Z5181 Encounter for therapeutic drug level monitoring: Secondary | ICD-10-CM

## 2015-11-24 DIAGNOSIS — Z954 Presence of other heart-valve replacement: Secondary | ICD-10-CM

## 2015-11-24 DIAGNOSIS — Z952 Presence of prosthetic heart valve: Secondary | ICD-10-CM

## 2015-11-24 LAB — POCT INR: INR: 2.3

## 2015-12-21 ENCOUNTER — Telehealth: Payer: Self-pay | Admitting: Cardiology

## 2015-12-21 ENCOUNTER — Ambulatory Visit (INDEPENDENT_AMBULATORY_CARE_PROVIDER_SITE_OTHER): Payer: Self-pay | Admitting: *Deleted

## 2015-12-21 DIAGNOSIS — Z950 Presence of cardiac pacemaker: Secondary | ICD-10-CM

## 2015-12-21 DIAGNOSIS — I442 Atrioventricular block, complete: Secondary | ICD-10-CM

## 2015-12-21 NOTE — Telephone Encounter (Signed)
LMOVM reminding pt to send remote transmission.   

## 2015-12-24 ENCOUNTER — Encounter: Payer: Self-pay | Admitting: Cardiology

## 2015-12-24 NOTE — Progress Notes (Signed)
Remote pacemaker transmission.   

## 2015-12-27 LAB — CUP PACEART REMOTE DEVICE CHECK
Battery Remaining Longevity: 25 mo
Battery Voltage: 2.97 V
Brady Statistic AS VP Percent: 68.14 %
Brady Statistic RA Percent Paced: 31.78 %
Date Time Interrogation Session: 20170729000644
Implantable Lead Implant Date: 20160705
Implantable Lead Implant Date: 20160705
Implantable Lead Location: 753858
Implantable Lead Location: 753859
Implantable Lead Model: 5071
Lead Channel Impedance Value: 4047 Ohm
Lead Channel Impedance Value: 4047 Ohm
Lead Channel Impedance Value: 532 Ohm
Lead Channel Impedance Value: 817 Ohm
Lead Channel Pacing Threshold Amplitude: 2.375 V
Lead Channel Pacing Threshold Pulse Width: 0.4 ms
Lead Channel Pacing Threshold Pulse Width: 0.4 ms
Lead Channel Pacing Threshold Pulse Width: 1 ms
Lead Channel Sensing Intrinsic Amplitude: 12.25 mV
Lead Channel Sensing Intrinsic Amplitude: 12.25 mV
Lead Channel Sensing Intrinsic Amplitude: 3.875 mV
Lead Channel Setting Pacing Amplitude: 3 V
MDC IDC LEAD IMPLANT DT: 20160705
MDC IDC LEAD LOCATION: 753860
MDC IDC MSMT LEADCHNL LV IMPEDANCE VALUE: 380 Ohm
MDC IDC MSMT LEADCHNL LV IMPEDANCE VALUE: 4047 Ohm
MDC IDC MSMT LEADCHNL LV IMPEDANCE VALUE: 4047 Ohm
MDC IDC MSMT LEADCHNL LV PACING THRESHOLD AMPLITUDE: 2 V
MDC IDC MSMT LEADCHNL RA PACING THRESHOLD AMPLITUDE: 0.75 V
MDC IDC MSMT LEADCHNL RA SENSING INTR AMPL: 3.875 mV
MDC IDC MSMT LEADCHNL RV IMPEDANCE VALUE: 4047 Ohm
MDC IDC MSMT LEADCHNL RV IMPEDANCE VALUE: 494 Ohm
MDC IDC SET LEADCHNL LV PACING PULSEWIDTH: 1 ms
MDC IDC SET LEADCHNL RA PACING AMPLITUDE: 1.5 V
MDC IDC SET LEADCHNL RV PACING AMPLITUDE: 5 V
MDC IDC SET LEADCHNL RV PACING PULSEWIDTH: 1 ms
MDC IDC SET LEADCHNL RV SENSING SENSITIVITY: 4 mV
MDC IDC STAT BRADY AP VP PERCENT: 31.77 %
MDC IDC STAT BRADY AP VS PERCENT: 0.01 %
MDC IDC STAT BRADY AS VS PERCENT: 0.07 %
MDC IDC STAT BRADY RV PERCENT PACED: 99.92 %

## 2016-01-05 ENCOUNTER — Ambulatory Visit (INDEPENDENT_AMBULATORY_CARE_PROVIDER_SITE_OTHER): Payer: Self-pay

## 2016-01-05 DIAGNOSIS — Z954 Presence of other heart-valve replacement: Secondary | ICD-10-CM

## 2016-01-05 DIAGNOSIS — Z5181 Encounter for therapeutic drug level monitoring: Secondary | ICD-10-CM

## 2016-01-05 DIAGNOSIS — Z952 Presence of prosthetic heart valve: Secondary | ICD-10-CM

## 2016-01-05 LAB — POCT INR: INR: 2.5

## 2016-01-11 ENCOUNTER — Encounter: Payer: Self-pay | Admitting: Cardiology

## 2016-01-11 NOTE — Progress Notes (Signed)
PCP: Marya Landry, MD  Clinic Note: Chief Complaint  Patient presents with  . Other    6 month follow up. Meds reviewed by the patient verbally. Pt. c/o chest pain.   . Cardiac Valve Problem    Status post aVR for AS/AI from Bicuspid AS    HPI: Nicholas Horton is a 40 y.o. male with a PMH below who presents today for month f/u of Valvular CM - s/p Mechanical AVR with PPM for Severe AS & 3rd Deg AVB with resultant valvular cardiomyopathy. EF improved to 50-55% after surgery as indicated by echocardiogram in November 2016.Nicholas Horton was last seen on 07/07/2015. He still notes exertional dyspnea as well as weight gain and possible fluid retention. Pacemaker indicated 70 run of nonsustained VT with occasional PVCs. No resting or exertional angina.  Recent Hospitalizations: None  Studies Reviewed: None  Interval History: Nicholas Horton presents today for routine follow-up, he continues to note persistent fatigue symptoms and exertional dyspnea, however he is able to walk maybe 30 minutes when the weather is cool. He does note that he may get somewhat short of breath while walking, but if he slows down little bit to catch his breath he is able to keep going. However otherwise he will start feel tired within 5-10 minutes in the little bit dyspneic. He does feel some skipped beats, but denies any sustained rapid rhythms.  He still notes some incisional discomfort in his chest with coughing or deep inspiration, but this is improved as well. Some incisional pain.  No chest pain or shortness of breath with rest, and no exertional chest pain. No PND, orthopnea or edema.  No lightheadedness, weakness or syncope/near syncope. No TIA/amaurosis fugax symptoms. No melena, hematochezia, hematuria, or epstaxis. No claudication.  ROS: A comprehensive was performed. Review of Systems  Constitutional: Positive for malaise/fatigue (still feels sluggish - tired after 5-10 min). Negative for chills  (rare) and fever.  HENT: Negative for nosebleeds.   Respiratory: Positive for cough (AM cough), shortness of breath and wheezing (occasionally). Negative for hemoptysis and sputum production.   Cardiovascular: Positive for chest pain (related to suture line with cough) and claudication (somewhat limiting, but walks through it). Negative for leg swelling.  Gastrointestinal: Negative for abdominal pain, blood in stool, heartburn and melena.  Genitourinary: Negative for hematuria.  Musculoskeletal: Positive for back pain and joint pain. Negative for falls.  Skin: Negative for rash.  Neurological: Positive for dizziness (after bending over). Negative for headaches.  Endo/Heme/Allergies: Bruises/bleeds easily.  Psychiatric/Behavioral: Positive for depression (better mood - still down about lack of energy & sob). Negative for memory loss. The patient is not nervous/anxious and does not have insomnia.   All other systems reviewed and are negative.   Past Medical History:  Diagnosis Date  . Congenital bicuspid aortic valve 09/2014   - s/p AVR  . Nonischemic cardiomyopathy (HCC) -- Resolved[I42.9] 09/2014   EF by Echo 20-25% (pre-op AVR) --> Echo 10/2014 & 03/2015: EF 50-55% - also Recent Normal Diastolic parameters   . S/P AVR (aortic valve replacement) 10/2014   21 mm Carbometrics Mechanical Valve -- Epicardial PPM Leads  . Severe aortic stenosis 10/04/2014   Presented with Syncope & CHF  . Third degree heart block (HCC) 10/04/2014   Transient CHB related to AS -- s/p PPM    Past Surgical History:  Procedure Laterality Date  . AORTIC VALVE REPLACEMENT N/A 12/01/2014   Procedure: AORTIC VALVE REPLACEMENT (AVR);  Surgeon: [name],  MD;  Location: [facility] OR;  Service: Open Heart Surgery;  Laterality: N/A;  . CARDIAC CATHETERIZATION N/A 10/04/2014   Procedure: Left Heart Cath and Coronary Angiography;  Surgeon: Marykay Lex, MD;  Location[facility] INVASIVE CV LAB;  Service: Cardiovascular;  Laterality:  N/A;  . CARDIAC CATHETERIZATION  10/04/2014   Procedure: Temporary Pacemaker;  Surgeon: Marykay Lex, MD;  Location: [facility] INVASIVE CV LAB;  Service: Cardiovascular;;  . CARDIAC CATHETERIZATION N/A 11/25/2014   Procedure: Temporary Pacemaker;  Surgeon: Marinus Maw, MD;  Location: [facility] INVASIVE CV LAB;  Service: Cardiovascular;  Laterality: N/A;  . MULTIPLE EXTRACTIONS WITH ALVEOLOPLASTY N/A 11/25/2014   Procedure: Extraction of tooth #'s 1,2,3,4,5,6,7,9, 10, 11, 12,13,14,16, 19, 20, 21, 22, 23, 24, 25, 26, 27, 28, 29, 30, 31 with alveoloplasty;  Surgeon: Charlynne Pander, DDS;  Location: [facility] OR;  Service: Oral Surgery;  Laterality: N/A;  . SURGERY SCROTAL / TESTICULAR     for undescended testicle as a child  . TEE WITHOUT CARDIOVERSION N/A 12/01/2014   Procedure: TRANSESOPHAGEAL ECHOCARDIOGRAM (TEE);  Surgeon: Kerin Perna, MD;  Location: [facility] OR;  Service: Open Heart Surgery;  Laterality: N/A;  . TRANSTHORACIC ECHOCARDIOGRAM  03/2015   Mildly dilated LV. EF 50 at 55%. Normal diastolic parameters. Mild to moderate stenosis of prosthetic aortic valve. Mild to moderate regurgitation    Prior to Admission medications   Medication Sig Start Date End Date Taking? Authorizing Provider  aspirin EC 81 MG EC tablet Take 1 tablet (81 mg total) by mouth daily. 10/07/14  Yes Ejiroghene E Emokpae, MD  atorvastatin (LIPITOR) 20 MG tablet Take 1 tablet (20 mg total) by mouth daily. 04/14/15  Yes Marykay Lex, MD  dicyclomine (BENTYL) 20 MG tablet Take 1 tablet (20 mg total) by mouth 3 (three) times daily as needed for spasms. 06/14/15  Yes Sharman Cheek, MD  furosemide (LASIX) 40 MG tablet Take 40 mg by mouth.   Yes Historical Provider, MD  metoprolol tartrate (LOPRESSOR) 25 MG tablet Take 1 tablet (25 mg total) by mouth 2 (two) times daily. 07/07/15  Yes Marykay Lex, MD  ondansetron (ZOFRAN ODT) 8 MG disintegrating tablet Take 1 tablet (8 mg total) by mouth every 8 (eight) hours as needed for nausea or  vomiting. 06/14/15  Yes Sharman Cheek, MD  sertraline (ZOLOFT) 50 MG tablet Take 100 mg by mouth at bedtime.    Yes Historical Provider, MD  warfarin (COUMADIN) 5 MG tablet TAKE AS DIRECTED BY COUMADIN CLINIC 05/28/15  Yes Duke Salvia, MD    No Known Allergies  Social History   Social History  . Marital status: Single    Spouse name: N/A  . Number of children: N/A  . Years of education: N/A   Occupational History  . 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    Social History Main Topics  . Smoking status: Former Smoker    Packs/day: 2.00    Years: 20.00    Types: Cigarettes  . Smokeless tobacco: Never Used     Comment: quit 10/04/14  . Alcohol use 0.0 oz/week     Comment: rarely  . Drug use: No  . Sexual activity: Yes   Other Topics Concern  . None   Social History Narrative   Lives in [place], [place] with fiance. No history of CAD or PPM, arrhythmia in parents or siblings. 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     Family History  Problem Relation Age of Onset  .  CAD Neg Hx   . Hypertension Neg Hx     Wt Readings from Last 3 Encounters:  01/12/16 245 lb 8 oz (111.4 kg)  07/07/15 255 lb 4 oz (115.8 kg)  06/13/15 240 lb (108.9 kg)    PHYSICAL EXAM BP 120/88 (BP Location: Left Arm, Patient Position: Sitting, Cuff Size: Normal)   Pulse (!) 57   Ht 6\' 2"  (1.88 m)   Wt 245 lb 8 oz (111.4 kg)   BMI 31.52 kg/m  General appearance: alert, cooperative, appears stated age and no distress; mild-moderately obese HEENT: [place]/AT, EOMI, MMM, anicteric sclera; essentially edentulous  Neck: no adenopathy, no carotid bruit, no JVD, supple, symmetrical, trachea midline and thyroid not enlarged, symmetric, no tenderness/mass/nodules Lungs: clear to auscultation bilaterally, normal percussion bilaterally and Nonlabored, good air movement Heart: RRR, normal S1 - metallic S2. 2/6 mid to late peaking C-D SEM at RUSB --> carotids, soft HSM @ apex Abdomen: soft, non-tender;  bowel sounds normal; no masses, no organomegaly Extremities: extremities normal, atraumatic, no cyanosis or edema Pulses: 2+ and symmetric Skin: Skin color, texture, turgor normal. No rashes or lesions Neurologic: Alert and oriented X 3, normal strength and tone. Normal symmetric reflexes. Normal coordination and gait Mental status: Alert, oriented, thought content appropriate, affect: blunted Cranial nerves: normal    Adult ECG Report  Rate: 62 ;  Rhythm: Atrial sensed-V paced rhythm.;   Narrative Interpretation: Stable EKG.   Other studies Reviewed: Additional studies/ records that were reviewed today include:  Recent Labs:  N/a -  PCP checks along with A1c     ASSESSMENT / PLAN: Problem List Items Addressed This Visit    S/P AVR (Chronic)    Valve continues to look good and stable. Will recheck after 6 month follow-up. Will need to follow mild residual AS. Continue warfarin for mechanical valve prophylaxis.       Relevant Orders   EKG 12-Lead (Completed)   Nonischemic cardiomyopathy-EF 50-55% echo 11/20/14 (Chronic)    Notable improvement of EF following valve surgery. He did not seem to tolerate the increased beta blocker to 25 mg twice a day. Will reduce Toprol 5 mg twice a day. Perhaps this will allow us to add an ARB      Relevant Medications   metoprolol tartrate (LOPRESSOR) 25 MG tablet   Other Relevant Orders   EKG 12-Lead (Completed)   Heart palpitations (Chronic)    Notably improved. He continues to be on low-dose beta blocker. As they have improved, I will reduce to 12 Mg twice a day metoprolol to help his energy level.      Relevant Orders   EKG 12-Lead (Completed)   Dyspnea    He still has chronic exertional dyspnea.  some of this could be related to chronotropic inotropic competence, so I will reduce his beta blocker to 12 mg twice a day. His PFTs perioperatively demonstrated some reversibility, I would recommend him discussing with his PCP possible  rescue inhaler to use prior to exercise.  I don't think that these are cardiac symptoms but, because his EF is still relatively preserved and diastolic function has recovered.       Relevant Orders   EKG 12-Lead (Completed)   Chronic diastolic heart failure (HCC) (Chronic)    He is on reduced dose of Lasix sometimes taking 40 and sometimes taking 20 mg of Lasix for edema. He really hasn't noticed as much of the abdominal girth and edema symptoms. He is on low-dose Lopressor, perhaps as we  reduce his beta blocker dose, we can add afterload reduction with ARB.      Relevant Medications   metoprolol tartrate (LOPRESSOR) 25 MG tablet   Cardiac pacemaker in situ - Primary (Chronic)    Pacemaker still seems to continue to be performing well. Following up in pacemaker clinic.      Relevant Orders   EKG 12-Lead (Completed)   Bicuspid aortic valve (Chronic)   Relevant Medications   metoprolol tartrate (LOPRESSOR) 25 MG tablet   Other Relevant Orders   EKG 12-Lead (Completed)    Other Visit Diagnoses   None.     Current medicines are reviewed at length with the patient today. (+/- concerns) still has fatigue and dyspnea The following changes have been made:   Medication Instructions:  Your physician has recommended you make the following change in your medication:  DECREASE metoprolol to 1/2 tablet (12.5 mg) twice daily Consider bronchodilator  - recommend discussing PCP  Labwork: none  Testing/Procedures: none  Follow-Up: Your physician wants you to follow-up in: six months with Dr. Herbie Baltimore in [place] or Dr. Okey Dupre in [place].    Studies Ordered:   Orders Placed This Encounter  Procedures  . EKG 12-Lead      [name], M.D., M.S. Interventional Cardiologist   Pager # [phone] Phone # [phone] [address] [place], [place] [address]

## 2016-01-12 ENCOUNTER — Encounter: Payer: Self-pay | Admitting: Cardiology

## 2016-01-12 ENCOUNTER — Ambulatory Visit (INDEPENDENT_AMBULATORY_CARE_PROVIDER_SITE_OTHER): Payer: Self-pay | Admitting: Cardiology

## 2016-01-12 VITALS — BP 120/88 | HR 57 | Ht 74.0 in | Wt 245.5 lb

## 2016-01-12 DIAGNOSIS — I428 Other cardiomyopathies: Secondary | ICD-10-CM

## 2016-01-12 DIAGNOSIS — Z950 Presence of cardiac pacemaker: Secondary | ICD-10-CM

## 2016-01-12 DIAGNOSIS — R0600 Dyspnea, unspecified: Secondary | ICD-10-CM

## 2016-01-12 DIAGNOSIS — Z952 Presence of prosthetic heart valve: Secondary | ICD-10-CM

## 2016-01-12 DIAGNOSIS — I5032 Chronic diastolic (congestive) heart failure: Secondary | ICD-10-CM

## 2016-01-12 DIAGNOSIS — Z954 Presence of other heart-valve replacement: Secondary | ICD-10-CM

## 2016-01-12 DIAGNOSIS — Q231 Congenital insufficiency of aortic valve: Secondary | ICD-10-CM

## 2016-01-12 DIAGNOSIS — I429 Cardiomyopathy, unspecified: Secondary | ICD-10-CM

## 2016-01-12 DIAGNOSIS — R002 Palpitations: Secondary | ICD-10-CM

## 2016-01-12 MED ORDER — METOPROLOL TARTRATE 25 MG PO TABS: 12.5000 mg | ORAL_TABLET | Freq: Two times a day (BID) | ORAL | 5 refills | Status: DC

## 2016-01-12 NOTE — Patient Instructions (Signed)
Medication Instructions:  Your physician has recommended you make the following change in your medication:  DECREASE metoprolol to 1/2 tablet (12.5 mg) twice daily   Labwork: none  Testing/Procedures: none  Follow-Up: Your physician wants you to follow-up in: six months with Dr. Herbie Baltimore in [place] or Dr. Okey Dupre in [place].  You will receive a reminder letter in the mail two months in advance. If you don't receive a letter, please call our office to schedule the follow-up appointment.   Any Other Special Instructions Will Be Listed Below (If Applicable).     If you need a refill on your cardiac medications before your next appointment, please call your pharmacy.

## 2016-01-13 ENCOUNTER — Encounter: Payer: Self-pay | Admitting: Cardiology

## 2016-01-13 NOTE — Assessment & Plan Note (Signed)
Valve continues to look good and stable. Will recheck after 6 month follow-up. Will need to follow mild residual AS. Continue warfarin for mechanical valve prophylaxis.

## 2016-01-13 NOTE — Assessment & Plan Note (Signed)
Notable improvement of EF following valve surgery. He did not seem to tolerate the increased beta blocker to 25 mg twice a day. Will reduce Toprol 5 mg twice a day. Perhaps this will allow [place] to add an ARB

## 2016-01-13 NOTE — Assessment & Plan Note (Signed)
Pacemaker still seems to continue to be performing well. Following up in pacemaker clinic.

## 2016-01-13 NOTE — Assessment & Plan Note (Signed)
He is on reduced dose of Lasix sometimes taking 40 and sometimes taking 20 mg of Lasix for edema. He really hasn't noticed as much of the abdominal girth and edema symptoms. He is on low-dose Lopressor, perhaps as we reduce his beta blocker dose, we can add afterload reduction with ARB.

## 2016-01-13 NOTE — Assessment & Plan Note (Signed)
Notably improved. He continues to be on low-dose beta blocker. As they have improved, I will reduce to 12 Mg twice a day metoprolol to help his energy level.

## 2016-01-13 NOTE — Assessment & Plan Note (Signed)
He still has chronic exertional dyspnea.  some of this could be related to chronotropic inotropic competence, so I will reduce his beta blocker to 12 mg twice a day. His PFTs perioperatively demonstrated some reversibility, I would recommend him discussing with his PCP possible rescue inhaler to use prior to exercise.  I don't think that these are cardiac symptoms but, because his EF is still relatively preserved and diastolic function has recovered.

## 2016-01-24 ENCOUNTER — Telehealth: Payer: Self-pay | Admitting: Cardiology

## 2016-01-24 NOTE — Telephone Encounter (Signed)
Message sent to Dr.Harding for advice. 

## 2016-01-24 NOTE — Telephone Encounter (Signed)
New message        Calling to check on the status on a letter from Dr Herbie Baltimore stating he cannot work for child support and food stamps.  Pt states it is the same letter he wrote last time, just need the date adjusted.  Please put note in epic so that the [place] office can print it out and pt will pick up note from there

## 2016-02-07 NOTE — Telephone Encounter (Signed)
Patient calling to check status of letter he needs this for upcoming appointments.

## 2016-02-07 NOTE — Telephone Encounter (Signed)
Returned call to patient.Spoke to Dr.Harding he will work on letter this Thursday 02/10/16 when he is in office.Advised I will send message to his nurse to make her aware.

## 2016-02-10 ENCOUNTER — Emergency Department: Payer: No Typology Code available for payment source

## 2016-02-10 ENCOUNTER — Encounter: Payer: Self-pay | Admitting: Cardiology

## 2016-02-10 ENCOUNTER — Emergency Department
Admission: EM | Admit: 2016-02-10 | Discharge: 2016-02-11 | Disposition: A | Discharge status: Home / Self Care | Payer: No Typology Code available for payment source | Attending: Emergency Medicine | Admitting: Emergency Medicine

## 2016-02-10 DIAGNOSIS — Z950 Presence of cardiac pacemaker: Secondary | ICD-10-CM | POA: Insufficient documentation

## 2016-02-10 DIAGNOSIS — Z954 Presence of other heart-valve replacement: Secondary | ICD-10-CM | POA: Insufficient documentation

## 2016-02-10 DIAGNOSIS — Z7982 Long term (current) use of aspirin: Secondary | ICD-10-CM | POA: Diagnosis not present

## 2016-02-10 DIAGNOSIS — Z7901 Long term (current) use of anticoagulants: Secondary | ICD-10-CM | POA: Diagnosis not present

## 2016-02-10 DIAGNOSIS — M791 Myalgia: Secondary | ICD-10-CM | POA: Diagnosis present

## 2016-02-10 DIAGNOSIS — M545 Low back pain: Secondary | ICD-10-CM | POA: Diagnosis not present

## 2016-02-10 DIAGNOSIS — M7918 Myalgia, other site: Secondary | ICD-10-CM

## 2016-02-10 DIAGNOSIS — I252 Old myocardial infarction: Secondary | ICD-10-CM | POA: Insufficient documentation

## 2016-02-10 DIAGNOSIS — M549 Dorsalgia, unspecified: Secondary | ICD-10-CM

## 2016-02-10 DIAGNOSIS — R079 Chest pain, unspecified: Secondary | ICD-10-CM | POA: Diagnosis not present

## 2016-02-10 DIAGNOSIS — Z79899 Other long term (current) drug therapy: Secondary | ICD-10-CM | POA: Insufficient documentation

## 2016-02-10 DIAGNOSIS — I5032 Chronic diastolic (congestive) heart failure: Secondary | ICD-10-CM | POA: Insufficient documentation

## 2016-02-10 LAB — BASIC METABOLIC PANEL
ANION GAP: 10 (ref 5–15)
BUN: 13 mg/dL (ref 6–20)
CO2: 23 mmol/L (ref 22–32)
Calcium: 9.3 mg/dL (ref 8.9–10.3)
Chloride: 104 mmol/L (ref 101–111)
Creatinine, Ser: 1 mg/dL (ref 0.61–1.24)
GLUCOSE: 210 mg/dL — AB (ref 65–99)
POTASSIUM: 4 mmol/L (ref 3.5–5.1)
Sodium: 137 mmol/L (ref 135–145)

## 2016-02-10 LAB — CBC
HCT: 43.2 % (ref 40.0–52.0)
HEMOGLOBIN: 15.2 g/dL (ref 13.0–18.0)
MCH: 31.1 pg (ref 26.0–34.0)
MCHC: 35.2 g/dL (ref 32.0–36.0)
MCV: 88.4 fL (ref 80.0–100.0)
Platelets: 194 10*3/uL (ref 150–440)
RBC: 4.89 MIL/uL (ref 4.40–5.90)
RDW: 13.7 % (ref 11.5–14.5)
WBC: 12.8 10*3/uL — ABNORMAL HIGH (ref 3.8–10.6)

## 2016-02-10 LAB — TROPONIN I

## 2016-02-10 NOTE — ED Notes (Signed)
Pt tender to palpation of ULQ, LLQ abdomen
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
## 2016-02-10 NOTE — Telephone Encounter (Signed)
Notified patient letter is ready for pick at [facility]  OFFICE. PATIENT VERBALIZED UNDERSTANDING

## 2016-02-10 NOTE — Progress Notes (Signed)
Nicholas Horton has again requested a letter to help with his disability. I explained to him, that I really don't have a lot of indication for writing a letter, but would provide a letter to help.  [name], MD

## 2016-02-10 NOTE — Telephone Encounter (Signed)
Letter printed and placed at front desk for pick up.

## 2016-02-10 NOTE — Telephone Encounter (Signed)
Letter dictated. Is in chart.  [name], MD

## 2016-02-10 NOTE — Telephone Encounter (Signed)
thnx  DH  
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
## 2016-02-11 ENCOUNTER — Emergency Department: Payer: No Typology Code available for payment source

## 2016-02-11 ENCOUNTER — Encounter: Payer: Self-pay | Admitting: Radiology

## 2016-02-11 LAB — PROTIME-INR
INR: 2.13
Prothrombin Time: 24.2 seconds — ABNORMAL HIGH (ref 11.4–15.2)

## 2016-02-11 MED ORDER — OXYCODONE-ACETAMINOPHEN 5-325 MG PO TABS: 1.0000 | ORAL_TABLET | ORAL | 0 refills | Status: DC | PRN

## 2016-02-11 MED ORDER — IOPAMIDOL (ISOVUE-300) INJECTION 61%
75.0000 mL | Freq: Once | INTRAVENOUS | Status: AC | PRN
  Administered 2016-02-11: 75 mL via INTRAVENOUS

## 2016-02-11 MED ORDER — CYCLOBENZAPRINE HCL 5 MG PO TABS: ORAL_TABLET | ORAL | 0 refills | Status: DC

## 2016-02-11 MED ORDER — ONDANSETRON HCL 4 MG/2ML IJ SOLN
4.0000 mg | Freq: Once | INTRAMUSCULAR | Status: AC
  Administered 2016-02-11: 4 mg via INTRAVENOUS
  Filled 2016-02-11: qty 2

## 2016-02-11 MED ORDER — CYCLOBENZAPRINE HCL 10 MG PO TABS
5.0000 mg | ORAL_TABLET | Freq: Once | ORAL | Status: AC
  Administered 2016-02-11: 5 mg via ORAL

## 2016-02-11 MED ORDER — CYCLOBENZAPRINE HCL 10 MG PO TABS
ORAL_TABLET | ORAL | Status: AC
  Filled 2016-02-11: qty 1

## 2016-02-11 MED ORDER — SODIUM CHLORIDE 0.9 % IV BOLUS (SEPSIS)
1000.0000 mL | Freq: Once | INTRAVENOUS | Status: AC
  Administered 2016-02-11: 1000 mL via INTRAVENOUS

## 2016-02-11 MED ORDER — MORPHINE SULFATE (PF) 4 MG/ML IV SOLN
4.0000 mg | Freq: Once | INTRAVENOUS | Status: AC
  Administered 2016-02-11: 4 mg via INTRAVENOUS
  Filled 2016-02-11: qty 1

## 2016-02-11 NOTE — ED Notes (Signed)
MD Sung at bedside. 

## 2016-02-11 NOTE — ED Notes (Signed)
Reviewed d/c instructions, follow-up care, use of ice/heat/elevation and prescriptions with pt. Pt verbalized understanding

## 2016-02-11 NOTE — Discharge Instructions (Signed)
1. Your CT scan shows coarctation of the aorta. Please follow up with your cardiothoracic surgeon. 2. You may take as needed for pain and muscle spasms (Percocet/Flexeril). 3. Apply moist heat to affected area several times daily. 4. Return to the ER for worsening symptoms, persistent vomiting, difficulty breathing or other concerns.

## 2016-02-16 ENCOUNTER — Ambulatory Visit (INDEPENDENT_AMBULATORY_CARE_PROVIDER_SITE_OTHER): Payer: Self-pay | Admitting: *Deleted

## 2016-02-16 DIAGNOSIS — Z5181 Encounter for therapeutic drug level monitoring: Secondary | ICD-10-CM

## 2016-02-16 DIAGNOSIS — Z954 Presence of other heart-valve replacement: Secondary | ICD-10-CM

## 2016-02-16 DIAGNOSIS — Z952 Presence of prosthetic heart valve: Secondary | ICD-10-CM

## 2016-02-16 LAB — POCT INR: INR: 3

## 2016-04-04 ENCOUNTER — Encounter: Payer: Self-pay | Admitting: Internal Medicine

## 2016-04-19 ENCOUNTER — Ambulatory Visit (INDEPENDENT_AMBULATORY_CARE_PROVIDER_SITE_OTHER): Payer: Self-pay

## 2016-04-19 DIAGNOSIS — Z5181 Encounter for therapeutic drug level monitoring: Secondary | ICD-10-CM

## 2016-04-19 DIAGNOSIS — Z952 Presence of prosthetic heart valve: Secondary | ICD-10-CM

## 2016-04-19 LAB — POCT INR: INR: 2.6

## 2016-06-07 ENCOUNTER — Ambulatory Visit (INDEPENDENT_AMBULATORY_CARE_PROVIDER_SITE_OTHER): Payer: Self-pay

## 2016-06-07 DIAGNOSIS — Z952 Presence of prosthetic heart valve: Secondary | ICD-10-CM

## 2016-06-07 DIAGNOSIS — Z5181 Encounter for therapeutic drug level monitoring: Secondary | ICD-10-CM

## 2016-06-07 LAB — POCT INR: INR: 2.6

## 2016-07-07 ENCOUNTER — Encounter: Payer: Self-pay | Admitting: Cardiology

## 2016-07-07 ENCOUNTER — Ambulatory Visit (INDEPENDENT_AMBULATORY_CARE_PROVIDER_SITE_OTHER): Payer: Self-pay | Admitting: Cardiology

## 2016-07-07 VITALS — BP 133/88 | HR 67 | Ht 74.0 in | Wt 247.0 lb

## 2016-07-07 DIAGNOSIS — R0600 Dyspnea, unspecified: Secondary | ICD-10-CM

## 2016-07-07 DIAGNOSIS — Q231 Congenital insufficiency of aortic valve: Secondary | ICD-10-CM

## 2016-07-07 DIAGNOSIS — R0609 Other forms of dyspnea: Secondary | ICD-10-CM

## 2016-07-07 DIAGNOSIS — E785 Hyperlipidemia, unspecified: Secondary | ICD-10-CM

## 2016-07-07 DIAGNOSIS — E66811 Obesity, class 1: Secondary | ICD-10-CM

## 2016-07-07 DIAGNOSIS — E669 Obesity, unspecified: Secondary | ICD-10-CM

## 2016-07-07 DIAGNOSIS — I442 Atrioventricular block, complete: Secondary | ICD-10-CM

## 2016-07-07 DIAGNOSIS — Z79899 Other long term (current) drug therapy: Secondary | ICD-10-CM

## 2016-07-07 DIAGNOSIS — Z952 Presence of prosthetic heart valve: Secondary | ICD-10-CM

## 2016-07-07 DIAGNOSIS — I428 Other cardiomyopathies: Secondary | ICD-10-CM

## 2016-07-07 DIAGNOSIS — Q2381 Bicuspid aortic valve: Secondary | ICD-10-CM

## 2016-07-07 DIAGNOSIS — I5032 Chronic diastolic (congestive) heart failure: Secondary | ICD-10-CM

## 2016-07-07 MED ORDER — LOSARTAN POTASSIUM 25 MG PO TABS: 25.0000 mg | ORAL_TABLET | Freq: Every day | ORAL | 11 refills | Status: DC

## 2016-07-07 MED ORDER — FUROSEMIDE 40 MG PO TABS: ORAL_TABLET | ORAL | 11 refills | Status: DC

## 2016-07-07 NOTE — Patient Instructions (Signed)
medication changes  Take lasix 40 mg daily, may take an extra dose daily  if needed  Start Losartan 25 mg - one tablet by mouth daily    LABS cmp ,lipid -inJune 2018 WILL MAIL LAB SLIP CLOSER TO THE TIME.    Your physician wants you to follow-up in 6 months with DR HARDING.You will receive a reminder letter in the mail two months in advance. If you don't receive a letter, please call our office to schedule the follow-up appointment.  If you need a refill on your cardiac medications before your next appointment, please call your pharmacy.
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
## 2016-07-11 ENCOUNTER — Encounter: Payer: Self-pay | Admitting: *Deleted

## 2016-07-11 ENCOUNTER — Encounter: Payer: Self-pay | Admitting: Internal Medicine

## 2016-07-13 DIAGNOSIS — E785 Hyperlipidemia, unspecified: Secondary | ICD-10-CM | POA: Insufficient documentation

## 2016-07-13 NOTE — Assessment & Plan Note (Signed)
This is a chronic issue with him now. It's hard to say his chronotropic incompetence cardiac pacemaker. I don't that he start his PCP about potential COPD. The only thing I can think of from a cardiac standpoint will be diastolic dysfunction.  I therefore have given him more leniency with taking additional Lasix and will start losartan 25 mg daily for afterload reduction.

## 2016-07-13 NOTE — Assessment & Plan Note (Signed)
Improved EF, but probably still has some diastolic dysfunction. Was not able tolerate these doses of medications. He never did start the losartan. He is only on 12.5 mg of Lopressor. He still takes some additional Lasix every now and then, so we will give him some more pills per month in order to not run out.  I will restart losartan 25 mg daily for afterload reduction

## 2016-07-13 NOTE — Assessment & Plan Note (Signed)
Status post PPM placement

## 2016-07-13 NOTE — Assessment & Plan Note (Signed)
He has not had labs checked recently. He is on atorvastatin. We'll check lipid panel.

## 2016-07-13 NOTE — Assessment & Plan Note (Signed)
I think is the biggest feature for his dyspnea. He doesn't exercise and he can't lose weight mass will makes him short of breath. I again counseled him on the importance of continued exercise. At least he is now trying to exercise.

## 2016-07-13 NOTE — Assessment & Plan Note (Addendum)
Should be due for follow-up echocardiogram at his next appointment. Continue on warfarin for prophylaxis. He will need lifelong SBE prophylaxis.  He is still working on disability. I will provide one more letter to assist with disability - after that, I simply cannot say that he has a valid reason to not work.

## 2016-07-13 NOTE — Assessment & Plan Note (Signed)
Status post mechanical aortic valve replacement for severe stenosis

## 2016-07-17 ENCOUNTER — Telehealth: Payer: Self-pay | Admitting: Cardiology

## 2016-07-17 NOTE — Telephone Encounter (Signed)
New Message    Per pt following up on letter he left Dr. Herbie Baltimore on his last visit that was supposed to be filled out. Requesting a call back.

## 2016-07-17 NOTE — Telephone Encounter (Signed)
Informed patient - letter is still not available , will contact patient as soon as available patient verbalized understanding

## 2016-07-21 NOTE — Telephone Encounter (Signed)
F/u Message  Pt returning RN call about letter. Please call back to discuss

## 2016-07-21 NOTE — Telephone Encounter (Signed)
Pt calling back about a letter he is needing from Dr. Herbie Baltimore. Jasmine December had discussed w him. Informed patient Dr. Herbie Baltimore and Jasmine December are out of office today.   I asked if there was anything else I could assist him with -- Pt voiced no, states OK to hear back next week regarding his inquiry.

## 2016-07-31 ENCOUNTER — Other Ambulatory Visit: Payer: Self-pay | Admitting: Cardiology

## 2016-07-31 ENCOUNTER — Telehealth: Payer: Self-pay | Admitting: Internal Medicine

## 2016-07-31 MED ORDER — WARFARIN SODIUM 5 MG PO TABS: ORAL_TABLET | ORAL | 0 refills | Status: DC

## 2016-07-31 MED ORDER — METOPROLOL TARTRATE 25 MG PO TABS: 12.5000 mg | ORAL_TABLET | Freq: Two times a day (BID) | ORAL | 11 refills | Status: DC

## 2016-07-31 NOTE — Telephone Encounter (Signed)
Returned call to pt & he stated he would like to have the refill to get him until Wednesday's appt & I confirmed [facility] appt & Pharmacy to send refill into. Pt will receive enough until appt.

## 2016-07-31 NOTE — Telephone Encounter (Signed)
Returned call to pt but had to leave a msg. Pt is overdue for appt & will need to be seen on 08/02/16 as scheduled. On the the message advised pt that I can send in enough pills to get him until appt. Will await pt response to see if he wants enough warfarin to be filled to get him until appt or if pt wants to come to [place] office to have INR checked today & refill accordingly at that time.

## 2016-07-31 NOTE — Telephone Encounter (Signed)
New message     *STAT* If patient is at the pharmacy, call can be transferred to refill team.   1. Which medications need to be refilled? (please list name of each medication and dose if known) warfarin 5mg , metoprolol 25mg    2. Which pharmacy/location (including street and city if local pharmacy) is medication to be sent to? [facility] in [place]   3. Do they need a 30 day or 90 day supply? 30 day

## 2016-07-31 NOTE — Telephone Encounter (Signed)
°*  STAT* If patient is at the pharmacy, call can be transferred to refill team.   1. Which medications need to be refilled? (please list name of each medication and dose if known) warfarin 5 mg po as directed   2. Which pharmacy/location (including street and city if local pharmacy) is medication to be sent to? [facility] [place]   3. Do they need a 30 day or 90 day supply? 30

## 2016-07-31 NOTE — Telephone Encounter (Signed)
Follow Up ° ° °Pt returning phone call. Requesting call back. °

## 2016-07-31 NOTE — Telephone Encounter (Signed)
Metoprolol refilled Warfarin refill request routed to pharmacy staff

## 2016-08-02 ENCOUNTER — Ambulatory Visit (INDEPENDENT_AMBULATORY_CARE_PROVIDER_SITE_OTHER): Payer: Self-pay

## 2016-08-02 ENCOUNTER — Encounter: Payer: Self-pay | Admitting: Cardiology

## 2016-08-02 DIAGNOSIS — Z5181 Encounter for therapeutic drug level monitoring: Secondary | ICD-10-CM

## 2016-08-02 DIAGNOSIS — Z952 Presence of prosthetic heart valve: Secondary | ICD-10-CM

## 2016-08-02 LAB — POCT INR: INR: 3.1

## 2016-08-02 MED ORDER — WARFARIN SODIUM 5 MG PO TABS: ORAL_TABLET | ORAL | 2 refills | Status: DC

## 2016-08-02 NOTE — Telephone Encounter (Signed)
Letter is in chart.  DH

## 2016-08-02 NOTE — Telephone Encounter (Signed)
Please advise 

## 2016-08-02 NOTE — Progress Notes (Signed)
I told Mr. Nicholas Horton I would do one more lateral for his disability. This will be the last letter that I write.  [name], MD

## 2016-08-02 NOTE — Telephone Encounter (Signed)
Patient in [place] for appt and checking status of letter .  Please call.

## 2016-08-03 ENCOUNTER — Encounter: Payer: Self-pay | Admitting: *Deleted

## 2016-08-03 NOTE — Telephone Encounter (Signed)
I've contacted the patient to let him know letter is ready. He requested to pick this up at the [place] office today. I've put this together with a stamp signature and faxed to [facility] Victorino Dike informs me she will look out for fax and hand to patient.

## 2016-08-17 ENCOUNTER — Emergency Department
Admission: EM | Admit: 2016-08-17 | Discharge: 2016-08-18 | Disposition: A | Discharge status: Home / Self Care | Payer: Self-pay | Attending: Student in an Organized Health Care Education/Training Program | Admitting: Student in an Organized Health Care Education/Training Program

## 2016-08-17 DIAGNOSIS — I5032 Chronic diastolic (congestive) heart failure: Secondary | ICD-10-CM | POA: Insufficient documentation

## 2016-08-17 DIAGNOSIS — F329 Major depressive disorder, single episode, unspecified: Secondary | ICD-10-CM | POA: Insufficient documentation

## 2016-08-17 DIAGNOSIS — Z79899 Other long term (current) drug therapy: Secondary | ICD-10-CM | POA: Insufficient documentation

## 2016-08-17 DIAGNOSIS — Z87891 Personal history of nicotine dependence: Secondary | ICD-10-CM | POA: Insufficient documentation

## 2016-08-17 DIAGNOSIS — F32A Depression, unspecified: Secondary | ICD-10-CM

## 2016-08-17 DIAGNOSIS — R454 Irritability and anger: Secondary | ICD-10-CM

## 2016-08-17 DIAGNOSIS — Z7982 Long term (current) use of aspirin: Secondary | ICD-10-CM | POA: Insufficient documentation

## 2016-08-17 DIAGNOSIS — Z7901 Long term (current) use of anticoagulants: Secondary | ICD-10-CM | POA: Insufficient documentation

## 2016-08-17 LAB — CBC
HEMATOCRIT: 43.3 % (ref 40.0–52.0)
Hemoglobin: 14.5 g/dL (ref 13.0–18.0)
MCH: 29.7 pg (ref 26.0–34.0)
MCHC: 33.6 g/dL (ref 32.0–36.0)
MCV: 88.4 fL (ref 80.0–100.0)
PLATELETS: 210 10*3/uL (ref 150–440)
RBC: 4.9 MIL/uL (ref 4.40–5.90)
RDW: 14 % (ref 11.5–14.5)
WBC: 12.7 10*3/uL — ABNORMAL HIGH (ref 3.8–10.6)

## 2016-08-17 LAB — COMPREHENSIVE METABOLIC PANEL
ALBUMIN: 4.4 g/dL (ref 3.5–5.0)
ALT: 27 U/L (ref 17–63)
AST: 26 U/L (ref 15–41)
Alkaline Phosphatase: 74 U/L (ref 38–126)
Anion gap: 8 (ref 5–15)
BUN: 13 mg/dL (ref 6–20)
CHLORIDE: 104 mmol/L (ref 101–111)
CO2: 26 mmol/L (ref 22–32)
Calcium: 9.4 mg/dL (ref 8.9–10.3)
Creatinine, Ser: 1.07 mg/dL (ref 0.61–1.24)
GFR calc Af Amer: >60 mL/min (ref >60)
GFR calc non Af Amer: >60 mL/min (ref >60)
GLUCOSE: 156 mg/dL — AB (ref 65–99)
POTASSIUM: 3.7 mmol/L (ref 3.5–5.1)
Sodium: 138 mmol/L (ref 135–145)
Total Bilirubin: 1 mg/dL (ref 0.3–1.2)
Total Protein: 8.1 g/dL (ref 6.5–8.1)

## 2016-08-17 LAB — URINALYSIS, COMPLETE (UACMP) WITH MICROSCOPIC
BACTERIA UA: NONE SEEN
BILIRUBIN URINE: NEGATIVE
Glucose, UA: NEGATIVE mg/dL
Ketones, ur: NEGATIVE mg/dL
LEUKOCYTES UA: NEGATIVE
Nitrite: NEGATIVE
Protein, ur: 100 mg/dL — AB
SPECIFIC GRAVITY, URINE: 1.026 (ref 1.005–1.030)
pH: 5 (ref 5.0–8.0)

## 2016-08-17 LAB — URINE DRUG SCREEN, QUALITATIVE (ARMC ONLY)
Amphetamines, Ur Screen: NOT DETECTED
Barbiturates, Ur Screen: NOT DETECTED
Benzodiazepine, Ur Scrn: NOT DETECTED
CANNABINOID 50 NG, UR ~~LOC~~: NOT DETECTED
COCAINE METABOLITE, UR ~~LOC~~: NOT DETECTED
MDMA (Ecstasy)Ur Screen: NOT DETECTED
Methadone Scn, Ur: NOT DETECTED
Opiate, Ur Screen: NOT DETECTED
PHENCYCLIDINE (PCP) UR S: NOT DETECTED
TRICYCLIC, UR SCREEN: NOT DETECTED

## 2016-08-17 LAB — ETHANOL: Alcohol, Ethyl (B): <5 mg/dL (ref <5)

## 2016-08-17 MED ORDER — FLUOXETINE HCL 20 MG PO CAPS: 20.0000 mg | ORAL_CAPSULE | Freq: Every day | ORAL | 0 refills | Status: DC

## 2016-08-17 MED ORDER — OLANZAPINE 2.5 MG PO TABS: 1.2500 mg | ORAL_TABLET | Freq: Every day | ORAL | 0 refills | Status: DC

## 2016-08-17 NOTE — ED Notes (Signed)
Pt currently cooperative and calm, lying in bed watching TV and in NAD at this time.

## 2016-08-17 NOTE — ED Notes (Signed)
ED Provider at bedside. 

## 2016-08-17 NOTE — ED Notes (Signed)
SOC placed in room, pt in view of sitter.

## 2016-08-17 NOTE — ED Notes (Signed)
SOC has been called for consult.

## 2016-08-17 NOTE — ED Notes (Signed)
Patient is voluntary and is pending SOC report.

## 2016-08-17 NOTE — ED Notes (Signed)
Psych Dr. Maricela Bo called and states will be contacting pt with [facility] shortly. Verbalized SOC is ready in pt's room.

## 2016-08-17 NOTE — ED Triage Notes (Signed)
Pt in with co aggressive behavior with family today, psych meds have been changed recently. States "I was close to having thoughts", when asked about SI and HI.

## 2016-08-17 NOTE — Discharge Instructions (Signed)
Stop your cymbalta and sertraline Start taking zyprexa and fluoxetine
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
## 2016-08-18 NOTE — ED Notes (Addendum)
Spoke with [name] on phone, pt's fiance, to come pick pt up for discharge. States will be coming to pick pt up from ED. Pt made aware of this.

## 2016-09-05 ENCOUNTER — Ambulatory Visit (INDEPENDENT_AMBULATORY_CARE_PROVIDER_SITE_OTHER): Payer: Self-pay | Admitting: Internal Medicine

## 2016-09-05 ENCOUNTER — Telehealth: Payer: Self-pay | Admitting: Internal Medicine

## 2016-09-05 ENCOUNTER — Encounter: Payer: Self-pay | Admitting: Internal Medicine

## 2016-09-05 ENCOUNTER — Ambulatory Visit (INDEPENDENT_AMBULATORY_CARE_PROVIDER_SITE_OTHER): Payer: Self-pay

## 2016-09-05 VITALS — BP 118/78 | HR 63 | Ht 74.0 in | Wt 258.8 lb

## 2016-09-05 DIAGNOSIS — G4730 Sleep apnea, unspecified: Secondary | ICD-10-CM

## 2016-09-05 DIAGNOSIS — Z952 Presence of prosthetic heart valve: Secondary | ICD-10-CM

## 2016-09-05 DIAGNOSIS — R0600 Dyspnea, unspecified: Secondary | ICD-10-CM

## 2016-09-05 DIAGNOSIS — Z950 Presence of cardiac pacemaker: Secondary | ICD-10-CM

## 2016-09-05 DIAGNOSIS — I442 Atrioventricular block, complete: Secondary | ICD-10-CM

## 2016-09-05 DIAGNOSIS — I5041 Acute combined systolic (congestive) and diastolic (congestive) heart failure: Secondary | ICD-10-CM

## 2016-09-05 DIAGNOSIS — Z5181 Encounter for therapeutic drug level monitoring: Secondary | ICD-10-CM

## 2016-09-05 LAB — POCT INR: INR: 1.6

## 2016-09-05 NOTE — Patient Instructions (Addendum)
Medication Instructions: - Your physician recommends that you continue on your current medications as directed. Please refer to the Current Medication list given to you today.  Labwork: - Your physician recommends that you have lab work today: TSH  Procedures/Testing: - Your physician has requested that you have an echocardiogram. Echocardiography is a painless test that uses sound waves to create images of your heart. It provides your doctor with information about the size and shape of your heart and how well your heart's chambers and valves are working. This procedure takes approximately one hour. There are no restrictions for this procedure.  Follow-Up: - You have been referred to: [facility] for evaluation of a sleep study  - Remote monitoring is used to monitor your Pacemaker of ICD from home. This monitoring reduces the number of office visits required to check your device to one time per year. It allows [place] to keep an eye on the functioning of your device to ensure it is working properly. You are scheduled for a device check from home on 12/05/16. You may send your transmission at any time that day. If you have a wireless device, the transmission will be sent automatically. After your physician reviews your transmission, you will receive a postcard with your next transmission date.  - Your physician wants you to follow-up in: 1 year with Dr. Graciela Husbands. You will receive a reminder letter in the mail two months in advance. If you don't receive a letter, please call our office to schedule the follow-up appointment.   Any Additional Special Instructions Will Be Listed Below (If Applicable).     If you need a refill on your cardiac medications before your next appointment, please call your pharmacy.

## 2016-09-05 NOTE — Progress Notes (Signed)
Patient Care Team: Nicholas Landry, MD as PCP - General (Student)   HPI  Kris No is a 41 y.o. male Seen for pacemaker implantation 7/16-CRT P done in conjunction with aortic valve replacement and epicardial LV and RV/atrial  unipolar lead.  When initially evaluated with complete heart block May 2016 his ejection fraction was 20-25%. A month later  the ejection fraction was remeasured at 50-55% and severe aortic stenosis was appreciated and he underwent valve replacement   Last seen 2016.   Seen by [facility] 2/18 with complaints of SOB/DOE   Noted obesity contributing   Has sleep disordered breathing and daytime somnolence & fatigue  Has struggled with depression and now on anti-psychotic  He would liike to go back to work   Records and Results Reviewed As above   Past Medical History:  Diagnosis Date  . Congenital bicuspid aortic valve 09/2014   - s/p AVR  . Nonischemic cardiomyopathy (HCC) -- Resolved[I42.9] 09/2014   EF by Echo 20-25% (pre-op AVR) --> Echo 10/2014 & 03/2015: EF 50-55% - also Recent Normal Diastolic parameters   . S/P AVR (aortic valve replacement) 10/2014   21 mm Carbometrics Mechanical Valve -- Epicardial PPM Leads  . Severe aortic stenosis 10/04/2014   Presented with Syncope & CHF  . Third degree heart block (HCC) 10/04/2014   Transient CHB related to AS -- s/p PPM    Past Surgical History:  Procedure Laterality Date  . AORTIC VALVE REPLACEMENT N/A 12/01/2014   Procedure: AORTIC VALVE REPLACEMENT (AVR);  Surgeon: Kerin Perna, MD;  Location: [facility] OR;  Service: Open Heart Surgery;  Laterality: N/A;  . CARDIAC CATHETERIZATION N/A 10/04/2014   Procedure: Left Heart Cath and Coronary Angiography;  Surgeon: Marykay Lex, MD;  Location: [facility] INVASIVE CV LAB;  Service: Cardiovascular;  Laterality: N/A;  . CARDIAC CATHETERIZATION  10/04/2014   Procedure: Temporary Pacemaker;  Surgeon: Marykay Lex, MD;  Location: [facility] INVASIVE CV LAB;  Service:  Cardiovascular;;  . CARDIAC CATHETERIZATION N/A 11/25/2014   Procedure: Temporary Pacemaker;  Surgeon: Marinus Maw, MD;  Location: [facility] INVASIVE CV LAB;  Service: Cardiovascular;  Laterality: N/A;  . MULTIPLE EXTRACTIONS WITH ALVEOLOPLASTY N/A 11/25/2014   Procedure: Extraction of tooth #'s 1,2,3,4,5,6,7,9, 10, 11, 12,13,14,16, 19, 20, 21, 22, 23, 24, 25, 26, 27, 28, 29, 30, 31 with alveoloplasty;  Surgeon: Charlynne Pander, DDS;  Location: [facility] OR;  Service: Oral Surgery;  Laterality: N/A;  . SURGERY SCROTAL / TESTICULAR     for undescended testicle as a child  . TEE WITHOUT CARDIOVERSION N/A 12/01/2014   Procedure: TRANSESOPHAGEAL ECHOCARDIOGRAM (TEE);  Surgeon: Kerin Perna, MD;  Location: [facility] OR;  Service: Open Heart Surgery;  Laterality: N/A;  . TRANSTHORACIC ECHOCARDIOGRAM  03/2015   Mildly dilated LV. EF 50 at 55%. Normal diastolic parameters. Mild to moderate stenosis of prosthetic aortic valve. Mild to moderate regurgitation    Current Outpatient Prescriptions  Medication Sig Dispense Refill  . aspirin EC 81 MG EC tablet Take 1 tablet (81 mg total) by mouth daily. 30 tablet 1  . atorvastatin (LIPITOR) 20 MG tablet Take 1 tablet (20 mg total) by mouth daily. 30 tablet 3  . FLUoxetine (PROZAC) 20 MG capsule Take 1 capsule (20 mg total) by mouth at bedtime. 30 capsule 0  . furosemide (LASIX) 40 MG tablet Take one tablet by mouth daily and may take an extra one dose additional if needed 60 tablet 11  . losartan (  COZAAR) 25 MG tablet Take 1 tablet (25 mg total) by mouth daily. 30 tablet 11  . metoprolol tartrate (LOPRESSOR) 25 MG tablet Take 0.5 tablets (12.5 mg total) by mouth 2 (two) times daily. 30 tablet 11  . OLANZapine (ZYPREXA) 2.5 MG tablet Take 0.5 tablets (1.25 mg total) by mouth at bedtime. 30 tablet 0  . ondansetron (ZOFRAN ODT) 8 MG disintegrating tablet Take 1 tablet (8 mg total) by mouth every 8 (eight) hours as needed for nausea or vomiting. 20 tablet 0  . warfarin (COUMADIN)  5 MG tablet TAKE AS DIRECTED BY COUMADIN CLINIC 30 tablet 2   No current facility-administered medications for this visit.     No Known Allergies    Review of Systems negative except from HPI and PMH  Physical Exam BP 118/78 (BP Location: Left Arm, Patient Position: Sitting, Cuff Size: Normal)   Pulse 63   Ht 6\' 2"  (1.88 m)   Wt 258 lb 12 oz (117.4 kg)   BMI 33.22 kg/m  Well developed and well nourished in no acute distress HENT normal E scleral and icterus clear  edentulous Neck Supple JVP 6-7 cm; carotids brisk and full Clear to ausculation Device pocket well healed; without hematoma or erythema.  There is no tethering  Regular rate and rhythm, mechanical S2 with her early systolic murmur Soft with active bowel sounds No clubbing cyanosis  Edema Alert and oriented, grossly normal motor and sensory function Skin Warm and Dry  ECG demonstrates P-synchronous/ AV  pacing 18/14/47 QRS RS V1   S Lead 1 Assessment and  Plan  Complete heart block  CRT P-[employer]  High RV greater than LV pacing thresholds  Dyspnea on exertion  Sleep apnea  Depression   ( from 2016 note) There is a problem with his unipolar RV greater than LV lead with high pacing thresholds. This currently translates into an estimated longevity of his device of 2 years. For right now, I do not think that it is worth operative intervention; I reviewed this with the patient. I think however, given the high RV outputs that at the time of generator replacement endovascular lead should be placed. An LV endovascular lead may also be appropriate depending on LV pacing thresholds.  His dyspnea may be 2/2 LV dysfunction with pacing   Will get echo.   Risk of Cardiomyopathy 2?2 [pacing should be relatively low, though 2/2 CRT with ECG suggesting reasonable lead placement  His fatigue is likely multifactorial, but OSA is almost certainly contributing   Needs sleep study  This will hopefully translate into less  depression as he might be able to return to work  We spent more than 50% of our >25 min visit in face to face counseling regarding the above

## 2016-09-05 NOTE — Addendum Note (Signed)
Addended bySherri Rad C on: 09/05/2016 10:43 AM   Modules accepted: Orders

## 2016-09-05 NOTE — Telephone Encounter (Signed)
Darleene Cleaver, RN with anticoag clinic for coumadin dosing. Pt 1.6 today. Instructed to have patient take 2 tablets today, 1.5 tablets tomorrow then resume normal dosage. Recheck in 2 weeks.

## 2016-09-06 LAB — TSH: TSH: 3.28 u[IU]/mL (ref 0.450–4.500)

## 2016-09-08 ENCOUNTER — Ambulatory Visit (INDEPENDENT_AMBULATORY_CARE_PROVIDER_SITE_OTHER): Payer: Self-pay

## 2016-09-08 ENCOUNTER — Other Ambulatory Visit: Payer: Self-pay

## 2016-09-08 DIAGNOSIS — R0600 Dyspnea, unspecified: Secondary | ICD-10-CM

## 2016-09-20 ENCOUNTER — Encounter: Payer: Self-pay | Admitting: *Deleted

## 2016-09-20 ENCOUNTER — Emergency Department: Payer: Self-pay

## 2016-09-20 ENCOUNTER — Telehealth: Payer: Self-pay

## 2016-09-20 ENCOUNTER — Emergency Department
Admission: EM | Admit: 2016-09-20 | Discharge: 2016-09-21 | Disposition: A | Discharge status: Home / Self Care | Payer: Self-pay | Attending: Emergency Medicine | Admitting: Emergency Medicine

## 2016-09-20 ENCOUNTER — Ambulatory Visit (INDEPENDENT_AMBULATORY_CARE_PROVIDER_SITE_OTHER): Payer: Self-pay

## 2016-09-20 DIAGNOSIS — Z952 Presence of prosthetic heart valve: Secondary | ICD-10-CM

## 2016-09-20 DIAGNOSIS — J189 Pneumonia, unspecified organism: Secondary | ICD-10-CM

## 2016-09-20 DIAGNOSIS — J181 Lobar pneumonia, unspecified organism: Secondary | ICD-10-CM | POA: Insufficient documentation

## 2016-09-20 DIAGNOSIS — I5032 Chronic diastolic (congestive) heart failure: Secondary | ICD-10-CM | POA: Insufficient documentation

## 2016-09-20 DIAGNOSIS — Z79899 Other long term (current) drug therapy: Secondary | ICD-10-CM | POA: Insufficient documentation

## 2016-09-20 DIAGNOSIS — R0602 Shortness of breath: Secondary | ICD-10-CM

## 2016-09-20 DIAGNOSIS — J9801 Acute bronchospasm: Secondary | ICD-10-CM | POA: Insufficient documentation

## 2016-09-20 DIAGNOSIS — Z5181 Encounter for therapeutic drug level monitoring: Secondary | ICD-10-CM

## 2016-09-20 DIAGNOSIS — Z7982 Long term (current) use of aspirin: Secondary | ICD-10-CM | POA: Insufficient documentation

## 2016-09-20 DIAGNOSIS — Z87891 Personal history of nicotine dependence: Secondary | ICD-10-CM | POA: Insufficient documentation

## 2016-09-20 HISTORY — DX: Heart failure, unspecified: I50.9

## 2016-09-20 LAB — CUP PACEART INCLINIC DEVICE CHECK
Brady Statistic AP VP Percent: 28.72 %
Brady Statistic AS VP Percent: 71.15 %
Brady Statistic AS VS Percent: 0.13 %
Date Time Interrogation Session: 20180410191103
Implantable Lead Implant Date: 20160705
Implantable Lead Implant Date: 20160705
Implantable Lead Location: 753860
Implantable Lead Model: 4968
Implantable Lead Model: 5071
Implantable Lead Model: 5071
Lead Channel Impedance Value: 4047 Ohm
Lead Channel Impedance Value: 4047 Ohm
Lead Channel Impedance Value: 4047 Ohm
Lead Channel Impedance Value: 475 Ohm
Lead Channel Pacing Threshold Amplitude: 0.75 V
Lead Channel Pacing Threshold Amplitude: 1.75 V
Lead Channel Pacing Threshold Amplitude: 2.25 V
Lead Channel Pacing Threshold Pulse Width: 0.4 ms
Lead Channel Pacing Threshold Pulse Width: 1 ms
Lead Channel Setting Pacing Amplitude: 1.5 V
Lead Channel Setting Pacing Amplitude: 4 V
Lead Channel Setting Pacing Pulse Width: 1 ms
Lead Channel Setting Pacing Pulse Width: 1 ms
MDC IDC LEAD IMPLANT DT: 20160705
MDC IDC LEAD LOCATION: 753858
MDC IDC LEAD LOCATION: 753859
MDC IDC MSMT BATTERY REMAINING LONGEVITY: 18 mo
MDC IDC MSMT BATTERY VOLTAGE: 2.95 V
MDC IDC MSMT LEADCHNL LV IMPEDANCE VALUE: 342 Ohm
MDC IDC MSMT LEADCHNL LV IMPEDANCE VALUE: 4047 Ohm
MDC IDC MSMT LEADCHNL LV IMPEDANCE VALUE: 4047 Ohm
MDC IDC MSMT LEADCHNL RA IMPEDANCE VALUE: 532 Ohm
MDC IDC MSMT LEADCHNL RA IMPEDANCE VALUE: 817 Ohm
MDC IDC MSMT LEADCHNL RA SENSING INTR AMPL: 4.375 mV
MDC IDC MSMT LEADCHNL RV PACING THRESHOLD AMPLITUDE: 2.25 V
MDC IDC MSMT LEADCHNL RV PACING THRESHOLD PULSEWIDTH: 1 ms
MDC IDC MSMT LEADCHNL RV PACING THRESHOLD PULSEWIDTH: 1.5 ms
MDC IDC PG IMPLANT DT: 20160705
MDC IDC SET LEADCHNL LV PACING AMPLITUDE: 2.5 V
MDC IDC SET LEADCHNL RV SENSING SENSITIVITY: 4 mV
MDC IDC STAT BRADY AP VS PERCENT: 0.01 %
MDC IDC STAT BRADY RA PERCENT PACED: 28.67 %
MDC IDC STAT BRADY RV PERCENT PACED: 99.72 %

## 2016-09-20 LAB — PROTIME-INR
INR: 2.33
Prothrombin Time: 26 seconds — ABNORMAL HIGH (ref 11.4–15.2)

## 2016-09-20 LAB — CBC
HEMATOCRIT: 40.9 % (ref 40.0–52.0)
Hemoglobin: 14 g/dL (ref 13.0–18.0)
MCH: 30.2 pg (ref 26.0–34.0)
MCHC: 34.3 g/dL (ref 32.0–36.0)
MCV: 88 fL (ref 80.0–100.0)
Platelets: 188 10*3/uL (ref 150–440)
RBC: 4.65 MIL/uL (ref 4.40–5.90)
RDW: 14.2 % (ref 11.5–14.5)
WBC: 10.3 10*3/uL (ref 3.8–10.6)

## 2016-09-20 LAB — BASIC METABOLIC PANEL
Anion gap: 10 (ref 5–15)
BUN: 19 mg/dL (ref 6–20)
CHLORIDE: 97 mmol/L — AB (ref 101–111)
CO2: 26 mmol/L (ref 22–32)
Calcium: 9.6 mg/dL (ref 8.9–10.3)
Creatinine, Ser: 1 mg/dL (ref 0.61–1.24)
GFR calc Af Amer: >60 mL/min (ref >60)
GFR calc non Af Amer: >60 mL/min (ref >60)
GLUCOSE: 320 mg/dL — AB (ref 65–99)
Potassium: 3.8 mmol/L (ref 3.5–5.1)
Sodium: 133 mmol/L — ABNORMAL LOW (ref 135–145)

## 2016-09-20 LAB — POCT INR: INR: 3.3

## 2016-09-20 LAB — TROPONIN I: Troponin I: <0.03 ng/mL (ref <0.03)

## 2016-09-20 MED ORDER — BENZONATATE 100 MG PO CAPS
100.0000 mg | ORAL_CAPSULE | Freq: Once | ORAL | Status: AC
  Administered 2016-09-20: 100 mg via ORAL
  Filled 2016-09-20: qty 1

## 2016-09-20 MED ORDER — IPRATROPIUM-ALBUTEROL 0.5-2.5 (3) MG/3ML IN SOLN
3.0000 mL | Freq: Once | RESPIRATORY_TRACT | Status: AC
  Administered 2016-09-20: 3 mL via RESPIRATORY_TRACT
  Filled 2016-09-20: qty 3

## 2016-09-20 MED ORDER — CEFTRIAXONE SODIUM-DEXTROSE 1-3.74 GM-% IV SOLR
1.0000 g | Freq: Once | INTRAVENOUS | Status: AC
  Administered 2016-09-20: 1 g via INTRAVENOUS
  Filled 2016-09-20: qty 50

## 2016-09-20 MED ORDER — DEXTROSE 5 % IV SOLN
500.0000 mg | Freq: Once | INTRAVENOUS | Status: AC
  Administered 2016-09-21: 500 mg via INTRAVENOUS
  Filled 2016-09-20: qty 500

## 2016-09-20 MED ORDER — CEFTRIAXONE SODIUM 1 G IJ SOLR: 1.0000 g | Freq: Once | INTRAMUSCULAR | Status: DC

## 2016-09-20 NOTE — ED Notes (Signed)
Pt in with co nonproductive cough for few days, states has chest pain only when he coughs. Unsure of fever at home, had pneumonia 1 month ago.

## 2016-09-20 NOTE — Telephone Encounter (Signed)
Patient requested financial information be mailed to [facility] business office.  Mailed and placed in out going mail for patient .

## 2016-09-20 NOTE — ED Triage Notes (Signed)
Per patient's report, patient c/o cough and shortness of breath since yesterday which worsened today. Patient c/o left-side chest pain described as a fluttering that began today. Patient has a complex cardiac history.

## 2016-09-20 NOTE — ED Notes (Signed)
Patient taken to xray.

## 2016-09-21 LAB — INFLUENZA PANEL BY PCR (TYPE A & B)
INFLAPCR: NEGATIVE
INFLBPCR: NEGATIVE

## 2016-09-21 LAB — BRAIN NATRIURETIC PEPTIDE: B NATRIURETIC PEPTIDE 5: 33 pg/mL (ref 0.0–100.0)

## 2016-09-21 MED ORDER — DOXYCYCLINE HYCLATE 100 MG PO TABS: 100.0000 mg | ORAL_TABLET | Freq: Two times a day (BID) | ORAL | 0 refills | Status: DC

## 2016-09-21 MED ORDER — ALBUTEROL SULFATE (2.5 MG/3ML) 0.083% IN NEBU: 2.5000 mg | INHALATION_SOLUTION | Freq: Four times a day (QID) | RESPIRATORY_TRACT | 12 refills | Status: DC | PRN

## 2016-09-21 MED ORDER — IPRATROPIUM-ALBUTEROL 0.5-2.5 (3) MG/3ML IN SOLN
3.0000 mL | Freq: Once | RESPIRATORY_TRACT | Status: AC
  Administered 2016-09-21: 3 mL via RESPIRATORY_TRACT
  Filled 2016-09-21: qty 3

## 2016-09-21 MED ORDER — AMOXICILLIN-POT CLAVULANATE 875-125 MG PO TABS: 1.0000 | ORAL_TABLET | Freq: Two times a day (BID) | ORAL | 0 refills | Status: AC

## 2016-09-21 MED ORDER — ACETAMINOPHEN 325 MG PO TABS
650.0000 mg | ORAL_TABLET | Freq: Once | ORAL | Status: AC
  Administered 2016-09-21: 650 mg via ORAL

## 2016-09-21 MED ORDER — ACETAMINOPHEN 325 MG PO TABS
ORAL_TABLET | ORAL | Status: AC
  Administered 2016-09-21: 650 mg via ORAL
  Filled 2016-09-21: qty 2

## 2016-09-21 NOTE — ED Provider Notes (Signed)
[facility] Emergency Department Provider Note   ____________________________________________   First MD Initiated Contact with Patient 09/20/16 2324     (approximate)  I have reviewed the triage vital signs and the nursing notes.   HISTORY  Chief Complaint Shortness of Breath and Chest Pain    HPI Nicholas Horton is a 41 y.o. male who comes into the hospital today with some shortness of breath and chest pain. The patient reports that he has chest pain from coughing so much. The patient also felt some palpitations. The patient's symptoms started at 11 days ago. He is significant other reports that he has some swelling in her stomach and his face. He told her that he did not feel well this evening and wanted to come into the hospital. The patient had pneumonia a month ago after being exposed to his children who were sick. He was given doxycycline. Here he had a temperature to 100.2. They did not check his temperature at home. His significant other reports that he wouldn't eat so she knew something was wrong. The patient rates his pain a 9 out of 10 in intensity and sedatives aches all over his body. The patient's daughter does have a cough at home as well. The patient was concerned that he may have pneumonia again. He reports that he feels as though he can't breathe. He is here for evaluation.   Past Medical History:  Diagnosis Date  . CHF (congestive heart failure) (HCC)   . Congenital bicuspid aortic valve 09/2014   - s/p AVR  . Nonischemic cardiomyopathy (HCC) -- Resolved[I42.9] 09/2014   EF by Echo 20-25% (pre-op AVR) --> Echo 10/2014 & 03/2015: EF 50-55% - also Recent Normal Diastolic parameters   . S/P AVR (aortic valve replacement) 10/2014   21 mm Carbometrics Mechanical Valve -- Epicardial PPM Leads  . Severe aortic stenosis 10/04/2014   Presented with Syncope & CHF  . Third degree heart block (HCC) 10/04/2014   Transient CHB related to AS -- s/p PPM     Patient Active Problem List   Diagnosis Date Noted  . Dyslipidemia 07/13/2016  . Obesity (BMI 30.0-34.9) 07/07/2016  . Heart palpitations 07/07/2015  . Chronic diastolic heart failure (HCC) 04/14/2015  . Encounter for therapeutic drug monitoring 12/16/2014  . S/P AVR 12/01/2014  . AS (aortic stenosis)   . Complete heart block (HCC) 11/23/2014  . E. Coli UTI 11/23/2014  . Chest pain 11/21/2014  . Syncope 11/21/2014  . Paradentosis 11/21/2014  . Cardiac pacemaker in situ 11/12/2014  . Nonischemic cardiomyopathy-EF 50-55% echo 11/20/14 10/16/2014  . Tobacco use disorder 10/07/2014  . Acute respiratory failure (HCC) 10/07/2014  . Bicuspid aortic valve   . NSTEMI, initial episode of care (HCC) 10/04/2014  . Third degree heart block (HCC) 10/04/2014  . Hypoxia 10/04/2014    Class: Acute  . Dyspnea on exertion 10/04/2014  . Moderate to severe AS with bicuspid valve. Mean 33, peak 61 mmHg. 10/04/2014    Past Surgical History:  Procedure Laterality Date  . AORTIC VALVE REPLACEMENT N/A 12/01/2014   Procedure: AORTIC VALVE REPLACEMENT (AVR);  Surgeon: Kerin Perna, MD;  Location: [facility] OR;  Service: Open Heart Surgery;  Laterality: N/A;  . CARDIAC CATHETERIZATION N/A 10/04/2014   Procedure: Left Heart Cath and Coronary Angiography;  Surgeon: Marykay Lex, MD;  Location: [facility] INVASIVE CV LAB;  Service: Cardiovascular;  Laterality: N/A;  . CARDIAC CATHETERIZATION  10/04/2014   Procedure: Temporary Pacemaker;  Surgeon: Piedad Climes  Herbie Baltimore, MD;  Location: [facility] INVASIVE CV LAB;  Service: Cardiovascular;;  . CARDIAC CATHETERIZATION N/A 11/25/2014   Procedure: Temporary Pacemaker;  Surgeon: Marinus Maw, MD;  Location: [facility] INVASIVE CV LAB;  Service: Cardiovascular;  Laterality: N/A;  . MULTIPLE EXTRACTIONS WITH ALVEOLOPLASTY N/A 11/25/2014   Procedure: Extraction of tooth #'s 1,2,3,4,5,6,7,9, 10, 11, 12,13,14,16, 19, 20, 21, 22, 23, 24, 25, 26, 27, 28, 29, 30, 31 with alveoloplasty;  Surgeon: Charlynne Pander, DDS;  Location: [facility] OR;  Service: Oral Surgery;  Laterality: N/A;  . SURGERY SCROTAL / TESTICULAR     for undescended testicle as a child  . TEE WITHOUT CARDIOVERSION N/A 12/01/2014   Procedure: TRANSESOPHAGEAL ECHOCARDIOGRAM (TEE);  Surgeon: Kerin Perna, MD;  Location: [facility] OR;  Service: Open Heart Surgery;  Laterality: N/A;  . TRANSTHORACIC ECHOCARDIOGRAM  03/2015   Mildly dilated LV. EF 50 at 55%. Normal diastolic parameters. Mild to moderate stenosis of prosthetic aortic valve. Mild to moderate regurgitation    Prior to Admission medications   Medication Sig Start Date End Date Taking? Authorizing Provider  albuterol (PROVENTIL) (2.5 MG/3ML) 0.083% nebulizer solution Take 3 mLs (2.5 mg total) by nebulization every 6 (six) hours as needed for wheezing or shortness of breath. 09/21/16   Rebecka Apley, MD  amoxicillin-clavulanate (AUGMENTIN) 875-125 MG tablet Take 1 tablet by mouth 2 (two) times daily. 09/21/16 09/28/16  Rebecka Apley, MD  aspirin EC 81 MG EC tablet Take 1 tablet (81 mg total) by mouth daily. 10/07/14   Ejiroghene Wendall Stade, MD  atorvastatin (LIPITOR) 20 MG tablet Take 1 tablet (20 mg total) by mouth daily. 04/14/15   Marykay Lex, MD  doxycycline (VIBRA-TABS) 100 MG tablet Take 1 tablet (100 mg total) by mouth 2 (two) times daily. 09/21/16   Rebecka Apley, MD  FLUoxetine (PROZAC) 20 MG capsule Take 1 capsule (20 mg total) by mouth at bedtime. 08/17/16 08/17/17  Willy Eddy, MD  furosemide (LASIX) 40 MG tablet Take one tablet by mouth daily and may take an extra one dose additional if needed 07/07/16   Marykay Lex, MD  losartan (COZAAR) 25 MG tablet Take 1 tablet (25 mg total) by mouth daily. 07/07/16 10/05/16  Marykay Lex, MD  metoprolol tartrate (LOPRESSOR) 25 MG tablet Take 0.5 tablets (12.5 mg total) by mouth 2 (two) times daily. 07/31/16   Marykay Lex, MD  OLANZapine (ZYPREXA) 2.5 MG tablet Take 0.5 tablets (1.25 mg total) by mouth at bedtime.  08/17/16 08/17/17  Willy Eddy, MD  ondansetron (ZOFRAN ODT) 8 MG disintegrating tablet Take 1 tablet (8 mg total) by mouth every 8 (eight) hours as needed for nausea or vomiting. 06/14/15   Sharman Cheek, MD  warfarin (COUMADIN) 5 MG tablet TAKE AS DIRECTED BY COUMADIN CLINIC 08/02/16   Duke Salvia, MD    Allergies Patient has no known allergies.  Family History  Problem Relation Age of Onset  . CAD Neg Hx   . Hypertension Neg Hx     Social History Social History  Substance Use Topics  . Smoking status: Former Smoker    Packs/day: 2.00    Years: 20.00    Types: Cigarettes  . Smokeless tobacco: Never Used     Comment: quit 10/04/14  . Alcohol use No     Comment: rarely    Review of Systems  Constitutional:  fever/chills Eyes: No visual changes. ENT: No sore throat. Cardiovascular:  chest pain. Respiratory:  shortness of breath.  Gastrointestinal: No abdominal pain.  No nausea, no vomiting.  No diarrhea.  No constipation. Genitourinary: Negative for dysuria. Musculoskeletal: Body aches Skin: Negative for rash. Neurological: Negative for headaches, focal weakness or numbness.   ____________________________________________   PHYSICAL EXAM:  VITAL SIGNS: ED Triage Vitals  Enc Vitals Group     BP 09/20/16 2240 (!) 159/97     Pulse Rate 09/20/16 2240 (!) 106     Resp 09/20/16 2240 (!) 22     Temp 09/20/16 2240 100.2 F (37.9 C)     Temp Source 09/20/16 2240 Oral     SpO2 09/20/16 2240 94 %     Weight 09/20/16 2242 260 lb (117.9 kg)     Height 09/20/16 2242 6\' 2"  (1.88 m)     Head Circumference --      Peak Flow --      Pain Score 09/20/16 2239 10     Pain Loc --      Pain Edu? --      Excl. in GC? --     Constitutional: Alert and oriented. Ill appearing and in Moderate distress. Eyes: Conjunctivae are normal. PERRL. EOMI. Head: Atraumatic. Nose: No congestion/rhinnorhea. Mouth/Throat: Mucous membranes are moist.  Oropharynx  non-erythematous. Cardiovascular: Tachycardia regular rhythm. Grossly normal heart sounds.  Good peripheral circulation. Respiratory: Increased respiratory effort.  No retractions. Decreased breath sounds throughout with some prolonged expiratory phase Gastrointestinal: Soft and nontender. No distention. Positive bowel sounds. Musculoskeletal: No lower extremity tenderness nor edema.   Neurologic:  Normal speech and language.  Skin:  Skin is warm, dry and intact. Marland Kitchen Psychiatric: Mood and affect are normal.   ____________________________________________   LABS (all labs ordered are listed, but only abnormal results are displayed)  Labs Reviewed  BASIC METABOLIC PANEL - Abnormal; Notable for the following:       Result Value   Sodium 133 (*)    Chloride 97 (*)    Glucose, Bld 320 (*)    All other components within normal limits  PROTIME-INR - Abnormal; Notable for the following:    Prothrombin Time 26.0 (*)    All other components within normal limits  BLOOD GAS, VENOUS - Abnormal; Notable for the following:    pO2, Ven 47.0 (*)    Bicarbonate 30.5 (*)    Acid-Base Excess 5.3 (*)    All other components within normal limits  CULTURE, BLOOD (ROUTINE X 2)  CULTURE, BLOOD (ROUTINE X 2)  CBC  TROPONIN I  BRAIN NATRIURETIC PEPTIDE  INFLUENZA PANEL BY PCR (TYPE A & B)   ____________________________________________  EKG  ED ECG REPORT I, Rebecka Apley, the attending physician, personally viewed and interpreted this ECG.   Date: 09/21/2016  EKG Time: 2235  Rate: 101  Rhythm: normal sinus rhythm, atrial sensed ventricular paced rhythm  Axis: right axis deviation  Intervals:none  ST&T Change: none  ____________________________________________  RADIOLOGY  CXR ____________________________________________   PROCEDURES  Procedure(s) performed: None  Procedures  Critical Care performed: No  ____________________________________________   INITIAL IMPRESSION /  ASSESSMENT AND PLAN / ED COURSE  Pertinent labs & imaging results that were available during my care of the patient were reviewed by me and considered in my medical decision making (see chart for details).  This is a 41 year old male who comes into the hospital today not feeling well. The patient has some shortness of breath and cough and he has a low-grade temperature. The patient's blood work is unremarkable but his chest x-ray does  show a new right basilar airspace opacity. I did give the patient a DuoNeb treatment as he did have some prolonged expiratory phase and diminished breath sounds. I also gave the patient some ceftriaxone and azithromycin. The patient will receive benzonatate. I checked a BNP which was negative as well as a flu which was also negative. I went back in to reassess the patient and he had some mild wheezes. I will give him 2 more DuoNeb treatments and I will then reassess the patient again. He is not hypoxic at this time and his cough is improved.  Clinical Course as of Sep 22 346  Wed Sep 20, 2016  2325 1. New right basilar airspace opacity raises concern for pneumonia. 2. Chronic left basilar scarring and chronic elevation of the left hemidiaphragm.   DG Chest 2 View [AW]    Clinical Course User Index [AW] Rebecka Apley, MD   The patient received 2 more DuoNeb treatments. His breathing is much improved and his wheezing is also improved. I will discharge the patient home to have him follow-up with his primary care physician. The patient should return with any worsening symptoms. He should also continue using the albuterol in a nebulizer at home.  ____________________________________________   FINAL CLINICAL IMPRESSION(S) / ED DIAGNOSES  Final diagnoses:  Community acquired pneumonia of right lower lobe of lung (HCC)  Shortness of breath  Bronchospasm      NEW MEDICATIONS STARTED DURING THIS VISIT:  New Prescriptions   ALBUTEROL (PROVENTIL) (2.5  MG/3ML) 0.083% NEBULIZER SOLUTION    Take 3 mLs (2.5 mg total) by nebulization every 6 (six) hours as needed for wheezing or shortness of breath.   AMOXICILLIN-CLAVULANATE (AUGMENTIN) 875-125 MG TABLET    Take 1 tablet by mouth 2 (two) times daily.   DOXYCYCLINE (VIBRA-TABS) 100 MG TABLET    Take 1 tablet (100 mg total) by mouth 2 (two) times daily.     Note:  This document was prepared using Dragon voice recognition software and may include unintentional dictation errors.    Rebecka Apley, MD 09/21/16 [phone]

## 2016-09-25 LAB — BLOOD GAS, VENOUS
Acid-Base Excess: 5.3 mmol/L — ABNORMAL HIGH (ref 0.0–2.0)
Bicarbonate: 30.5 mmol/L — ABNORMAL HIGH (ref 20.0–28.0)
O2 SAT: 83.9 %
PATIENT TEMPERATURE: 37
PO2 VEN: 47 mmHg — AB (ref 32.0–45.0)
pCO2, Ven: 46 mmHg (ref 44.0–60.0)
pH, Ven: 7.43 (ref 7.250–7.430)

## 2016-09-26 LAB — CULTURE, BLOOD (ROUTINE X 2)
CULTURE: NO GROWTH
Culture: NO GROWTH
SPECIAL REQUESTS: ADEQUATE
Special Requests: ADEQUATE

## 2016-09-29 DIAGNOSIS — Z7901 Long term (current) use of anticoagulants: Secondary | ICD-10-CM | POA: Insufficient documentation

## 2016-09-29 DIAGNOSIS — F339 Major depressive disorder, recurrent, unspecified: Secondary | ICD-10-CM | POA: Insufficient documentation

## 2016-09-29 DIAGNOSIS — I509 Heart failure, unspecified: Secondary | ICD-10-CM | POA: Insufficient documentation

## 2016-10-03 ENCOUNTER — Telehealth: Payer: Self-pay | Admitting: *Deleted

## 2016-10-03 ENCOUNTER — Other Ambulatory Visit: Payer: Self-pay | Admitting: *Deleted

## 2016-10-03 ENCOUNTER — Ambulatory Visit (INDEPENDENT_AMBULATORY_CARE_PROVIDER_SITE_OTHER): Payer: Self-pay | Admitting: Internal Medicine

## 2016-10-03 ENCOUNTER — Encounter: Payer: Self-pay | Admitting: Internal Medicine

## 2016-10-03 VITALS — BP 110/80 | HR 69 | Ht 74.0 in | Wt 256.0 lb

## 2016-10-03 DIAGNOSIS — Z5181 Encounter for therapeutic drug level monitoring: Secondary | ICD-10-CM

## 2016-10-03 DIAGNOSIS — Z79899 Other long term (current) drug therapy: Secondary | ICD-10-CM

## 2016-10-03 DIAGNOSIS — J45909 Unspecified asthma, uncomplicated: Secondary | ICD-10-CM

## 2016-10-03 DIAGNOSIS — I5032 Chronic diastolic (congestive) heart failure: Secondary | ICD-10-CM

## 2016-10-03 DIAGNOSIS — Q231 Congenital insufficiency of aortic valve: Secondary | ICD-10-CM

## 2016-10-03 DIAGNOSIS — G4719 Other hypersomnia: Secondary | ICD-10-CM

## 2016-10-03 DIAGNOSIS — I428 Other cardiomyopathies: Secondary | ICD-10-CM

## 2016-10-03 DIAGNOSIS — R0600 Dyspnea, unspecified: Secondary | ICD-10-CM

## 2016-10-03 DIAGNOSIS — R0609 Other forms of dyspnea: Secondary | ICD-10-CM

## 2016-10-03 DIAGNOSIS — E669 Obesity, unspecified: Secondary | ICD-10-CM

## 2016-10-03 DIAGNOSIS — Z952 Presence of prosthetic heart valve: Secondary | ICD-10-CM

## 2016-10-03 MED ORDER — FLUTICASONE FUROATE-VILANTEROL 100-25 MCG/INH IN AEPB: 1.0000 | INHALATION_SPRAY | Freq: Every day | RESPIRATORY_TRACT | 0 refills | Status: DC

## 2016-10-03 MED ORDER — FLUTICASONE FUROATE-VILANTEROL 100-25 MCG/INH IN AEPB: 1.0000 | INHALATION_SPRAY | Freq: Every day | RESPIRATORY_TRACT | 5 refills | Status: DC

## 2016-10-03 NOTE — Addendum Note (Signed)
Addended by: Alease Frame on: 10/03/2016 04:16 PM   Modules accepted: Orders

## 2016-10-03 NOTE — Progress Notes (Signed)
[facility] [place] [facility] Consultation      Assessment and Plan:  Excessive daytime sleepiness.  -Symptoms and signs of short of sleep apnea, patient also has a inadequate sleep, and a sleeping for much of the day. -We'll send for sleep study, in addition, instructed the patient on reducing sleep during the day.  Insomnia.  -Patient is going to bed very late, and waking up late. -We discussed that to him and his girlfriend can alter their schedules to try to move their sleep time 2 in earlier. In the day.  Dyspnea on exertion, possible asthma.  -Recent pneumonia with continued dyspnea, suspect asthma with history of smoking. -We'll start Breo 100 one puff once daily, patient may continue to use albuterol nebulizer as needed.   Date: 10/03/2016  MRN# [id number] Nicholas Horton Mar 04, 1976  Referring Physician: Dr. Graciela Husbands for OSA.   Nicholas Horton is a 41 y.o. old male seen in consultation for chief complaint of:    Chief Complaint  Patient presents with  . Advice Only    Ed f/u pneumonia rt lung; sob/bronchspasm    HPI:   He has been having trouble falling asleep. He goes to bed at varied times; usually from 12-2am. He wakes at 645 am to drop his child at school. He gets back home at 8, goes back to bed and wakes at noon or later.  He then goes to get her at 2:30pm and then he will be up for the day.   He was recently at [facility] [place] for hyperglycemia and pneumonia. He has had some dyspnea since AV replacement for bicuspid AV in May of 2016. He had a left lung collapse apparently at that time. He is currently on albuterol nebs 2 to 3 times per day. He notes that his breathing is better for 3 hours.   He does not work, is waiting for disability. He has never been diagnosed with OSA. He used to smoke 1 to 2 ppd.     PMHX:   Past Medical History:  Diagnosis Date  . CHF (congestive heart failure) (HCC)   . Congenital bicuspid aortic valve 09/2014   - s/p AVR  .  Nonischemic cardiomyopathy (HCC) -- Resolved[I42.9] 09/2014   EF by Echo 20-25% (pre-op AVR) --> Echo 10/2014 & 03/2015: EF 50-55% - also Recent Normal Diastolic parameters   . S/P AVR (aortic valve replacement) 10/2014   21 mm Carbometrics Mechanical Valve -- Epicardial PPM Leads  . Severe aortic stenosis 10/04/2014   Presented with Syncope & CHF  . Third degree heart block (HCC) 10/04/2014   Transient CHB related to AS -- s/p PPM   Surgical Hx:  Past Surgical History:  Procedure Laterality Date  . AORTIC VALVE REPLACEMENT N/A 12/01/2014   Procedure: AORTIC VALVE REPLACEMENT (AVR);  Surgeon: Kerin Perna, MD;  Location: [facility] OR;  Service: Open Heart Surgery;  Laterality: N/A;  . CARDIAC CATHETERIZATION N/A 10/04/2014   Procedure: Left Heart Cath and Coronary Angiography;  Surgeon: Marykay Lex, MD;  Location: [facility] INVASIVE CV LAB;  Service: Cardiovascular;  Laterality: N/A;  . CARDIAC CATHETERIZATION  10/04/2014   Procedure: Temporary Pacemaker;  Surgeon: Marykay Lex, MD;  Location: [facility] INVASIVE CV LAB;  Service: Cardiovascular;;  . CARDIAC CATHETERIZATION N/A 11/25/2014   Procedure: Temporary Pacemaker;  Surgeon: Marinus Maw, MD;  Location: [facility] INVASIVE CV LAB;  Service: Cardiovascular;  Laterality: N/A;  . MULTIPLE EXTRACTIONS WITH ALVEOLOPLASTY N/A 11/25/2014   Procedure: Extraction of tooth #'s 1,2,3,4,5,6,7,9,  10, 11, 12,13,14,16, 19, 20, 21, 22, 23, 24, 25, 26, 27, 28, 29, 30, 31 with alveoloplasty;  Surgeon: Charlynne Pander, DDS;  Location: [facility] OR;  Service: Oral Surgery;  Laterality: N/A;  . SURGERY SCROTAL / TESTICULAR     for undescended testicle as a child  . TEE WITHOUT CARDIOVERSION N/A 12/01/2014   Procedure: TRANSESOPHAGEAL ECHOCARDIOGRAM (TEE);  Surgeon: Kerin Perna, MD;  Location: [facility] OR;  Service: Open Heart Surgery;  Laterality: N/A;  . TRANSTHORACIC ECHOCARDIOGRAM  03/2015   Mildly dilated LV. EF 50 at 55%. Normal diastolic parameters. Mild to moderate stenosis of prosthetic  aortic valve. Mild to moderate regurgitation   Family Hx:  Family History  Problem Relation Age of Onset  . CAD Neg Hx   . Hypertension Neg Hx    Social Hx:   Social History  Substance Use Topics  . Smoking status: Former Smoker    Packs/day: 2.00    Years: 20.00    Types: Cigarettes  . Smokeless tobacco: Never Used     Comment: quit 10/04/14  . Alcohol use No     Comment: rarely   Medication:   Reviewed.    Allergies:  Patient has no known allergies.  Review of Systems: Gen:  Denies  fever, sweats, chills HEENT: Denies blurred vision, double vision. bleeds, sore throat Cvc:  No dizziness, chest pain. Resp:   Denies cough or sputum production, shortness of breath Gi: Denies swallowing difficulty, stomach pain. Gu:  Denies bladder incontinence, burning urine Ext:   No Joint pain, stiffness. Skin: No skin rash,  hives  Endoc:  No polyuria, polydipsia. Psych: No depression, insomnia. Other:  All other systems were reviewed with the patient and were negative other that what is mentioned in the HPI.   Physical Examination:   VS: BP 110/80 (BP Location: Right Arm, Patient Position: Sitting, Cuff Size: Normal)   Pulse 69   Ht 6\' 2"  (1.88 m)   Wt 256 lb (116.1 kg)   SpO2 96%   BMI 32.87 kg/m   General Appearance: No distress  Neuro:without focal findings,  speech normal,  HEENT: PERRLA, EOM intact.  [place] 3.  Pulmonary: normal breath sounds, No wheezing.  CardiovascularNormal S1,S2.  No m/r/g.   Abdomen: Benign, Soft, non-tender. Renal:  No costovertebral tenderness  GU:  No performed at this time. Endoc: No evident thyromegaly, no signs of acromegaly. Skin:   warm, no rashes, no ecchymosis  Extremities: normal, no cyanosis, clubbing.  Other findings:    LABORATORY PANEL:   CBC No results for input(s): WBC, HGB, HCT, PLT in the last 168  hours. ------------------------------------------------------------------------------------------------------------------  Chemistries  No results for input(s): NA, K, CL, CO2, GLUCOSE, BUN, CREATININE, CALCIUM, MG, AST, ALT, ALKPHOS, BILITOT in the last 168 hours.  Invalid input(s): GFRCGP ------------------------------------------------------------------------------------------------------------------  Cardiac Enzymes No results for input(s): TROPONINI in the last 168 hours. ------------------------------------------------------------  RADIOLOGY:  No results found.     Thank  you for the consultation and for allowing [facility] to assist in the care of your patient. Our recommendations are noted above.  Please contact us if we can be of further service.   Wells Guiles, MD.  Board Certified in Internal Medicine, Pulmonary Medicine, Critical Care Medicine, and Sleep Medicine.  [place] [facility] Office Number: [phone]  Santiago Glad, M.D.  Billy Fischer, M.D  10/03/2016

## 2016-10-03 NOTE — Telephone Encounter (Signed)
Mail letter and labslip  

## 2016-10-03 NOTE — Patient Instructions (Addendum)
--  Will send for split night sleep study.   --Will send for full lung function test and Chest Xray.   --Try to wake stay awake during the day, do not nap during the day, go to bed at night.   --Will start Breo 100; one puff once daily, rinse mouth after use.  Continue to use albuterol as needed.   --Try to increase your activity level, such as 30 min of walking at least 4 days of week.

## 2016-10-03 NOTE — Telephone Encounter (Signed)
-----   Message from Tobin Chad, RN sent at 07/07/2016  2:07 PM EST ----- CMP,LIPID  DUE BY June 9 ,2018  MAIL LABSLIP IN MAY 2018  PATIENT CAN USE LAB IN [place]

## 2016-10-04 ENCOUNTER — Ambulatory Visit (INDEPENDENT_AMBULATORY_CARE_PROVIDER_SITE_OTHER): Payer: Self-pay

## 2016-10-04 DIAGNOSIS — Z952 Presence of prosthetic heart valve: Secondary | ICD-10-CM

## 2016-10-04 DIAGNOSIS — Z5181 Encounter for therapeutic drug level monitoring: Secondary | ICD-10-CM

## 2016-10-04 LAB — POCT INR: INR: 1.9

## 2016-10-07 IMAGING — CR DG CHEST 2V
2 series · 2 of 2 positions shown · non-contrast
Comparison: Portable chest x-ray dated November 20, 2014

CLINICAL DATA: Status post permanent pacemaker placement, chest
soreness.

EXAM:
CHEST  2 VIEW

[chest pa]
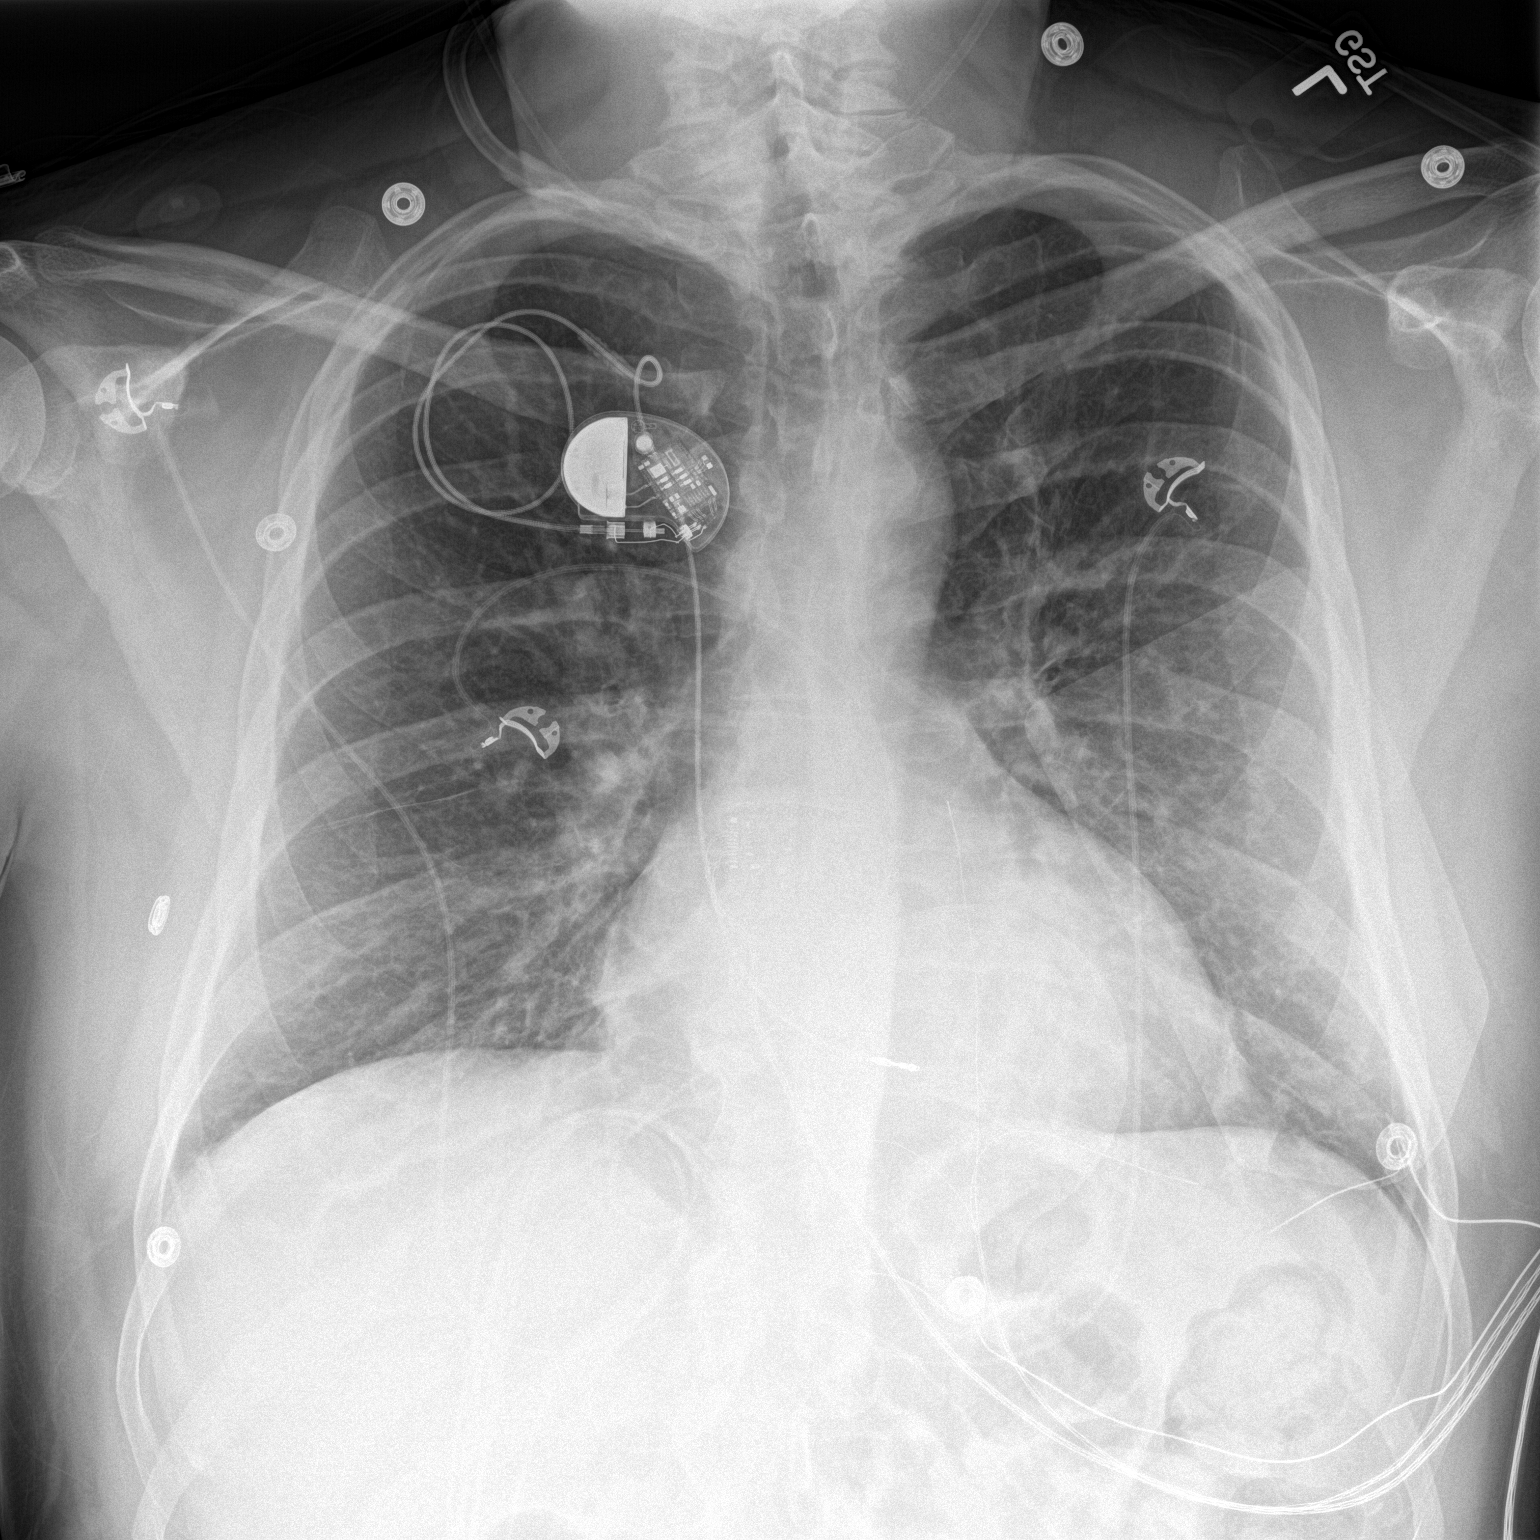

[chest lat]
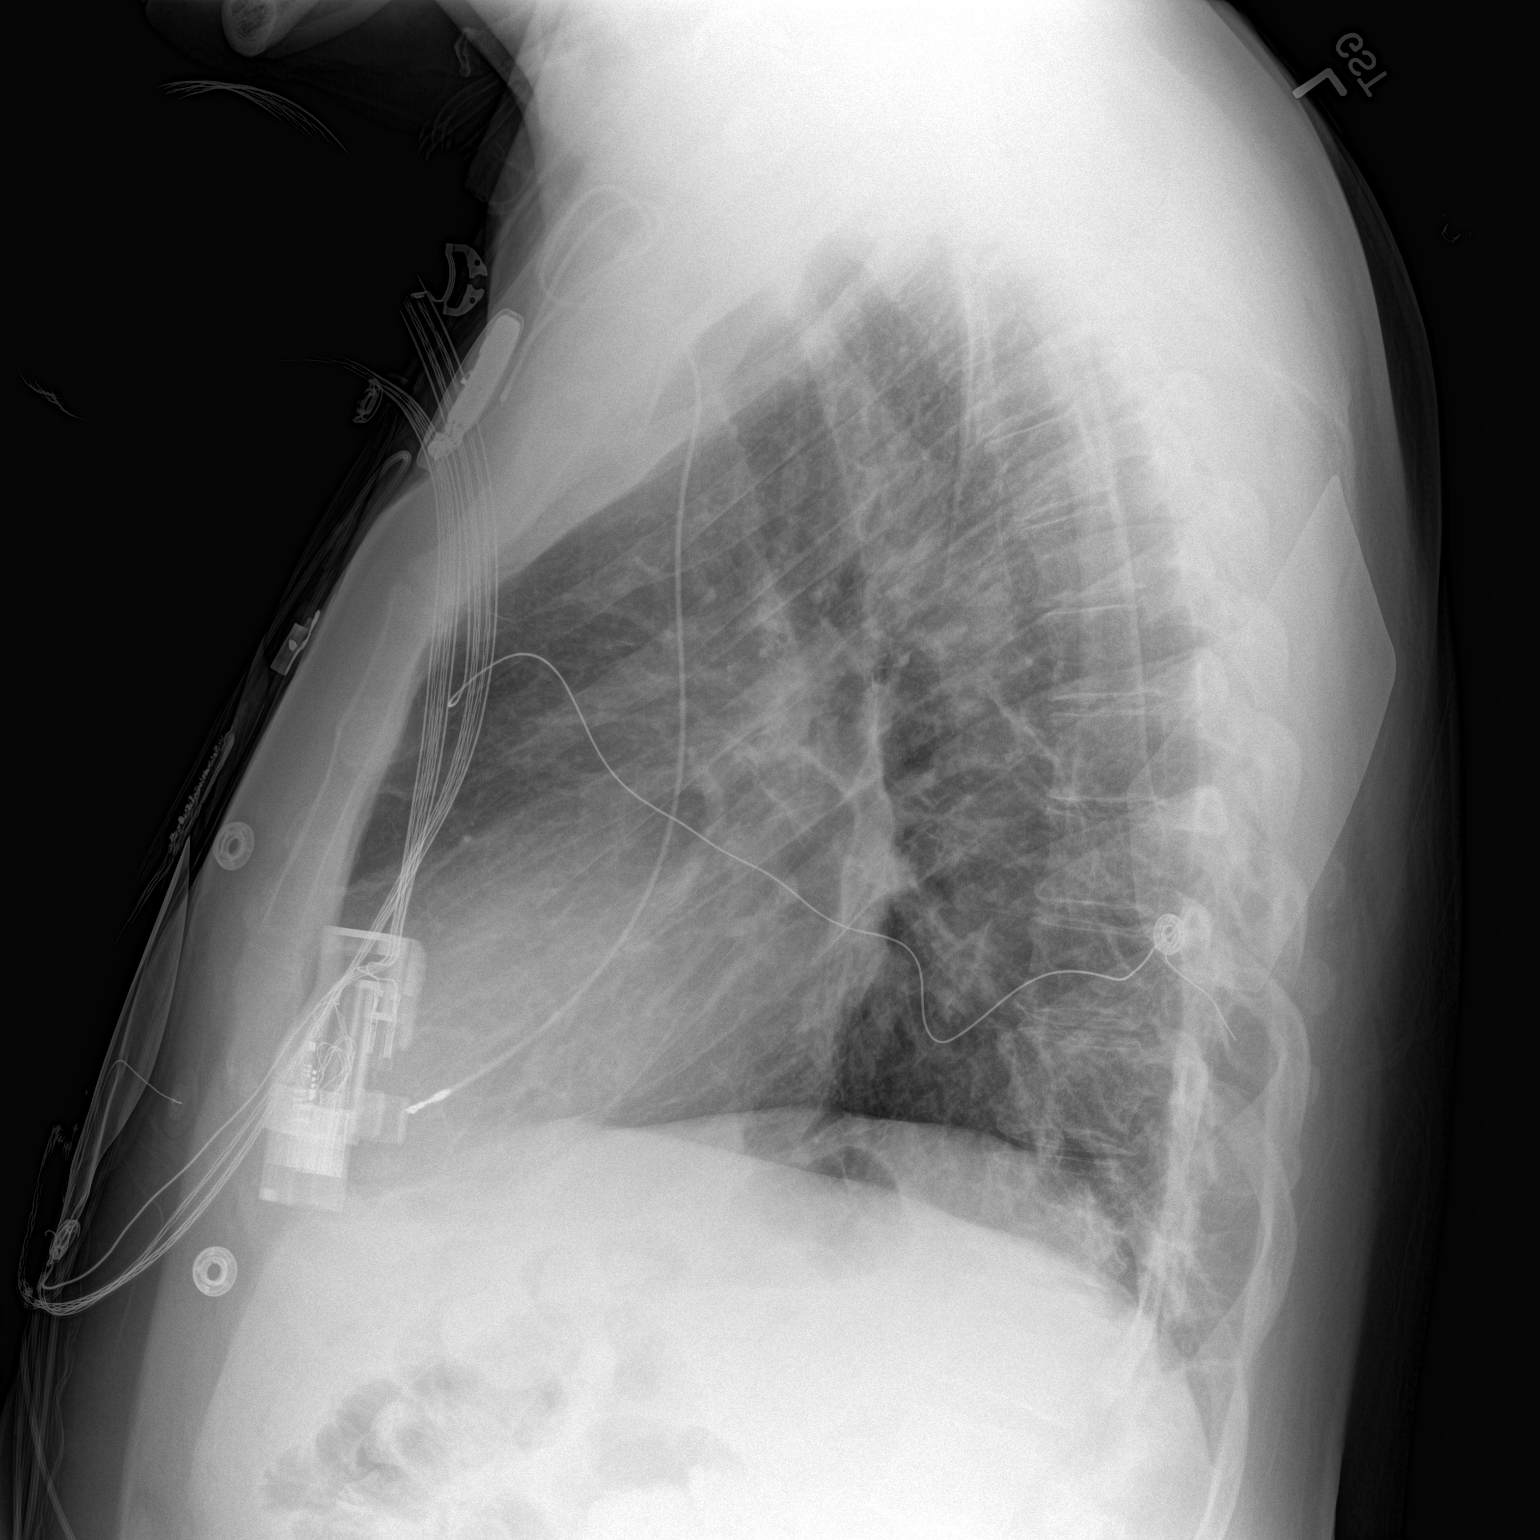

[2 of 2 positions shown; findings below may reference images not displayed]

FINDINGS: There has been interval placement of a single electrode permanent
pacemaker. The generator overlies the right upper medial pectoral
region. The right ventricular lead is in reasonable position
radiographically. There is no pneumothorax or pleural effusion. The
pulmonary interstitial markings are mildly prominent bilaterally.
The cardiac silhouette is normal in size. The pulmonary vascularity
is prominent centrally though stable. The mediastinum is normal in
width. The bony thorax exhibits no acute abnormality.
IMPRESSION: No postprocedure complication following permanent pacemaker
placement. Low-grade interstitial edema.

## 2016-10-07 IMAGING — CT CT CHEST W/O CM
2 of 4 series · 15 of 36 positions shown, 18 images · non-contrast
Comparison: Chest radiograph of same day.

CLINICAL DATA: Aortic stenosis, shortness of breath.

EXAM:
CT CHEST WITHOUT CONTRAST
TECHNIQUE: Multidetector CT imaging of the chest was performed following the
standard protocol without IV contrast.

[Series 2: thorax 5.0 i31f 1 · axial · 0.85mm/px · z∈[-326,-51]mm · 12 of 65 slices shown, 15 images]
[im 5/65  mediastinal]
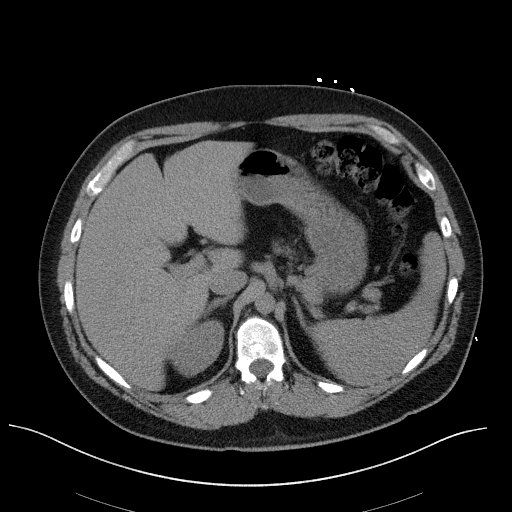
[im 5/65  lung]
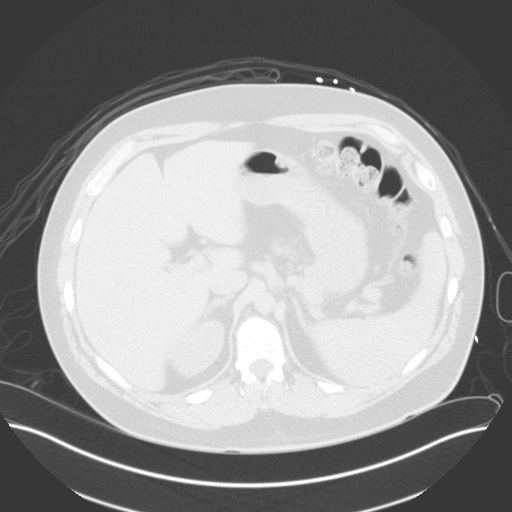
[im 10/65  lung]
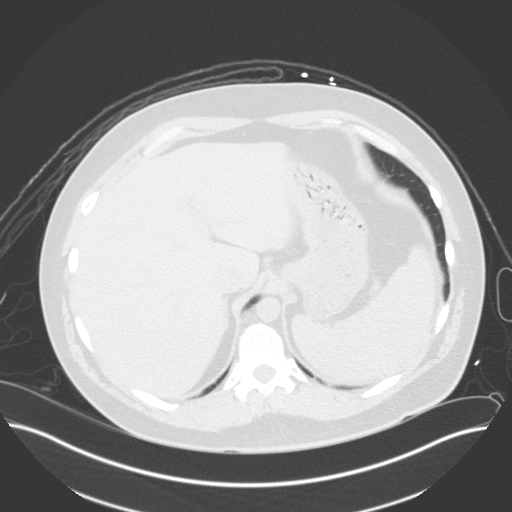
[im 14/65  lung]
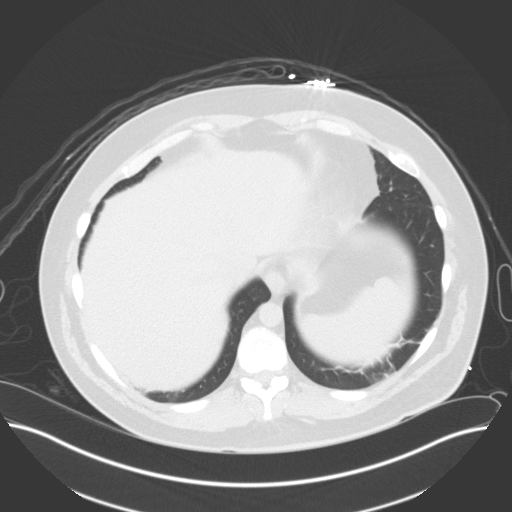
[im 19/65  lung]
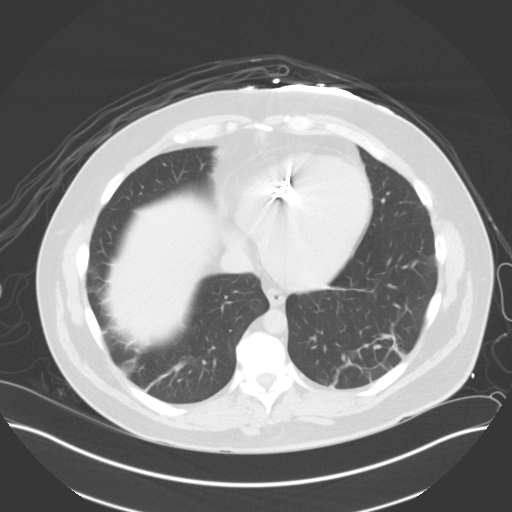
[im 23/65  mediastinal]
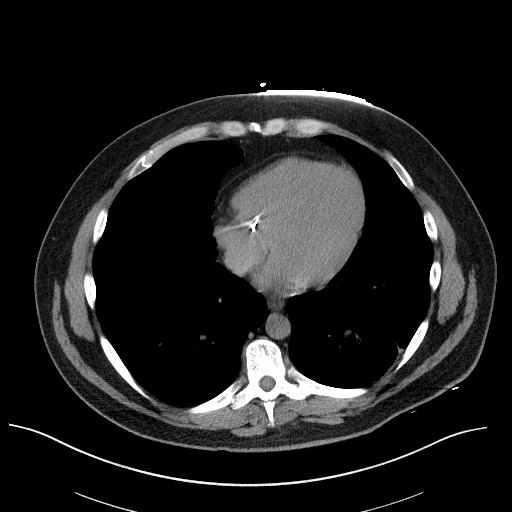
[im 23/65  lung]
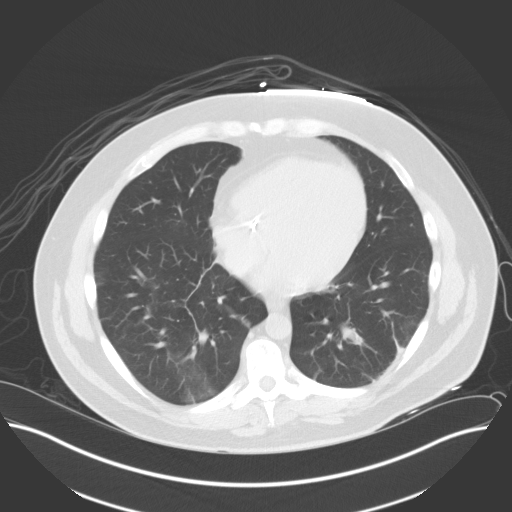
[im 28/65  lung]
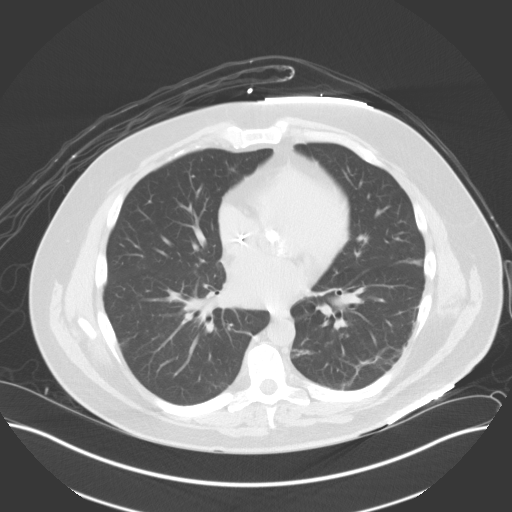
[im 37/65  lung]
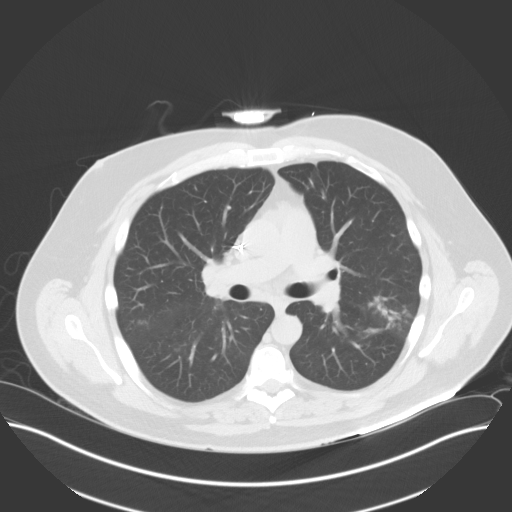
[im 42/65  lung]
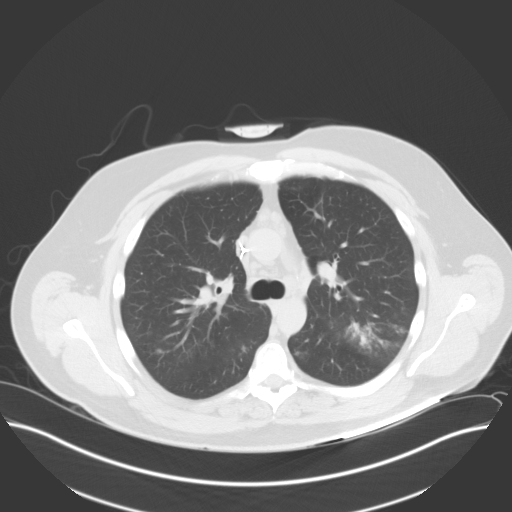
[im 46/65  mediastinal]
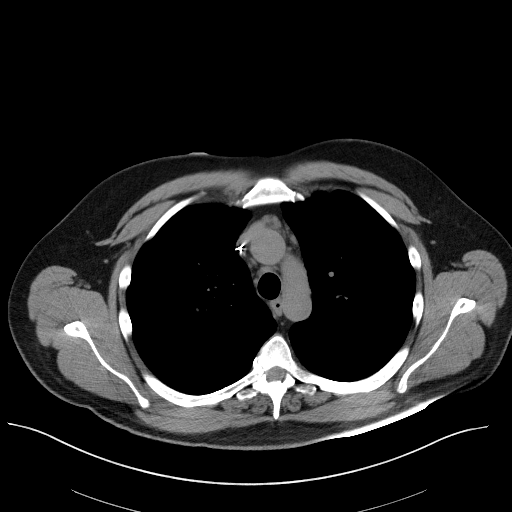
[im 46/65  lung]
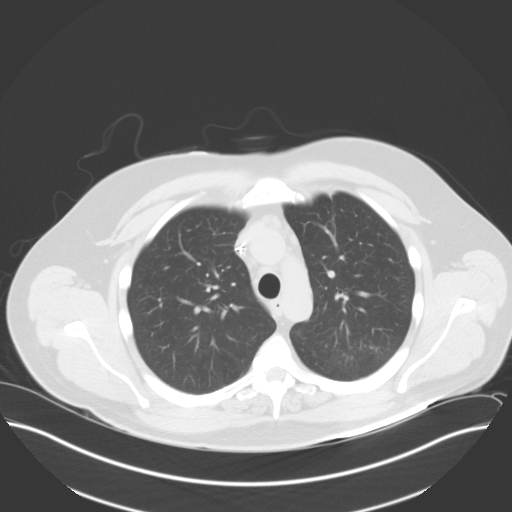
[im 51/65  lung]
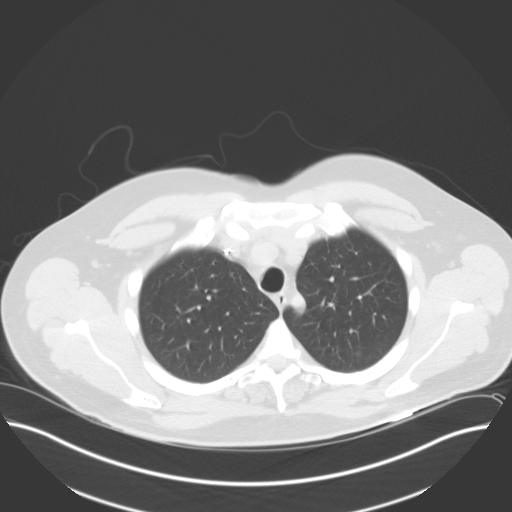
[im 55/65  lung]
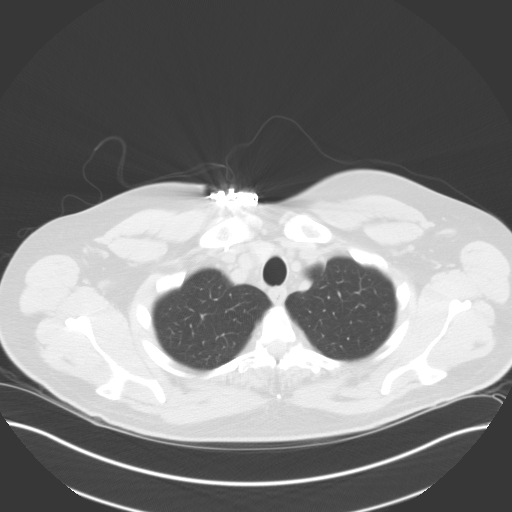
[im 60/65  lung]
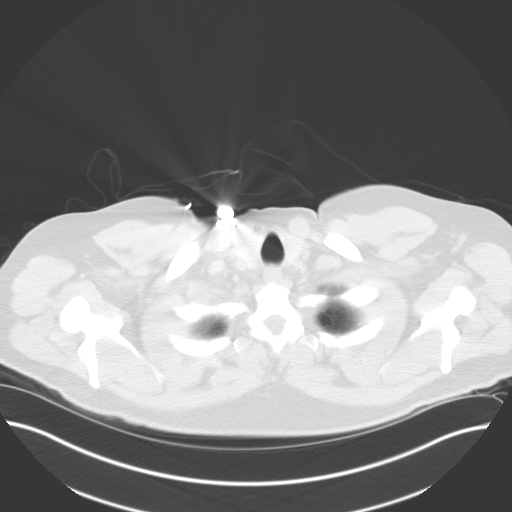

[Series 5: coronal · coronal · 0.65mm/px · 3 of 95 slices shown]
[im 19/95  lung]
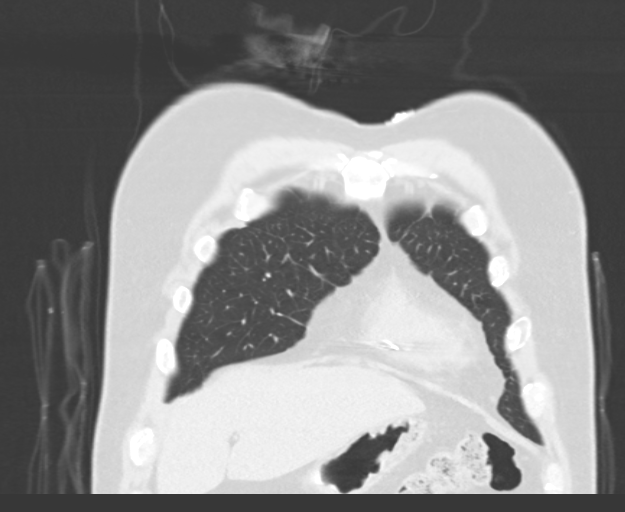
[im 38/95  lung]
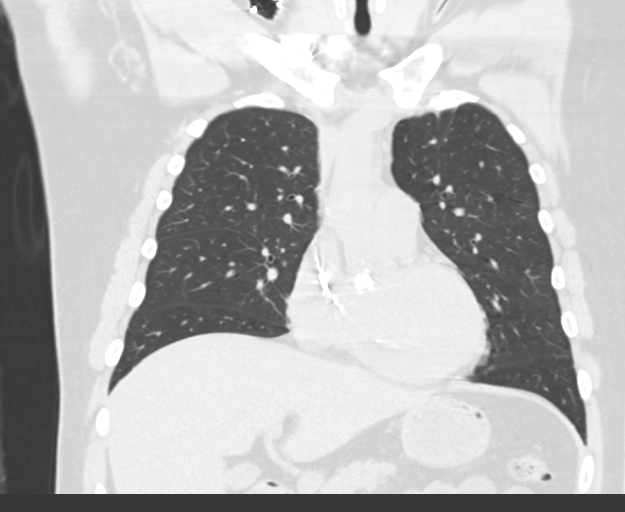
[im 57/95  lung]
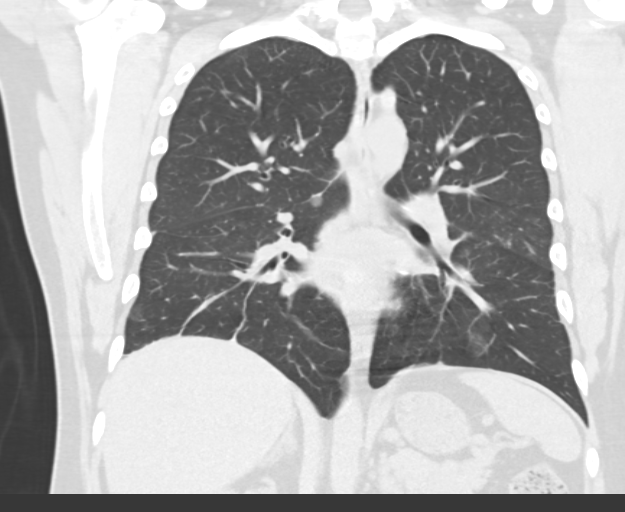

[15 of 36 positions shown; findings below may reference images not displayed]

FINDINGS: No pneumothorax or pleural effusion is noted. Right internal jugular
pacemaker is noted. Minimal bilateral posterior basilar subsegmental
atelectasis is noted. Mild airspace opacity is noted posteriorly in
left upper lobe concerning for pneumonia. Significant calcifications
of aortic valve are noted suggesting underlying aortic stenosis. No
significant mediastinal adenopathy is noted on these unenhanced
images. Visualized portion of upper abdomen appears normal. No
significant osseous abnormality is noted.
IMPRESSION: Significant calcifications of aortic valve are noted suggesting
underlying aortic stenosis.

Minimal bilateral posterior basilar subsegmental atelectasis is
noted.

Mild airspace opacity is noted posteriorly in the left upper lobe
concerning for pneumonia. Followup radiographs are recommended to
ensure resolution.

## 2016-10-24 ENCOUNTER — Ambulatory Visit: Payer: Self-pay | Attending: Internal Medicine

## 2016-10-24 DIAGNOSIS — G4739 Other sleep apnea: Secondary | ICD-10-CM | POA: Insufficient documentation

## 2016-10-24 DIAGNOSIS — G4734 Idiopathic sleep related nonobstructive alveolar hypoventilation: Secondary | ICD-10-CM | POA: Insufficient documentation

## 2016-10-24 DIAGNOSIS — I509 Heart failure, unspecified: Secondary | ICD-10-CM | POA: Insufficient documentation

## 2016-10-24 DIAGNOSIS — G4733 Obstructive sleep apnea (adult) (pediatric): Secondary | ICD-10-CM | POA: Insufficient documentation

## 2016-10-24 DIAGNOSIS — G4710 Hypersomnia, unspecified: Secondary | ICD-10-CM | POA: Insufficient documentation

## 2016-10-27 ENCOUNTER — Telehealth: Payer: Self-pay | Admitting: *Deleted

## 2016-10-27 DIAGNOSIS — G4733 Obstructive sleep apnea (adult) (pediatric): Secondary | ICD-10-CM

## 2016-10-27 NOTE — Telephone Encounter (Signed)
LMTCB X1 

## 2016-10-27 NOTE — Telephone Encounter (Signed)
-----   Message from Shane Crutch, MD sent at 10/27/2016  4:03 PM EDT ----- Regarding: Split night results.  Auto-Bipap could be initiated in the home using the  settings of Low Expiratory pressure of 8, and Max Expiratory pressure of 15 with pressure support of 5.The patient was fitted with a  FF Simplus Mask, Small

## 2016-11-10 NOTE — Telephone Encounter (Signed)
2nd message left for patient to return call for results of sleep study.

## 2016-11-15 ENCOUNTER — Telehealth: Payer: Self-pay | Admitting: Internal Medicine

## 2016-11-15 NOTE — Telephone Encounter (Signed)
Barbara Cower from [facility] calling needing raw sleep study results  Fax to [phone]

## 2016-11-16 ENCOUNTER — Telehealth: Payer: Self-pay | Admitting: *Deleted

## 2016-11-16 NOTE — Telephone Encounter (Signed)
Spoke with Barbara Cower at [facility] who states patient has declined services at this time. Patient informed that he is applying for disability and will reach back out later for services.

## 2016-11-16 NOTE — Telephone Encounter (Signed)
LMTCB in regards to [employer] CPAP assistance application.

## 2016-11-16 NOTE — Telephone Encounter (Signed)
Patient returned call regarding cpap assistance application. Patient would not be able to afford $100 fee. Patient is getting charity care through [facility].

## 2016-12-05 ENCOUNTER — Ambulatory Visit (INDEPENDENT_AMBULATORY_CARE_PROVIDER_SITE_OTHER): Payer: Self-pay | Admitting: *Deleted

## 2016-12-05 DIAGNOSIS — I442 Atrioventricular block, complete: Secondary | ICD-10-CM

## 2016-12-05 NOTE — Progress Notes (Signed)
Remote pacemaker transmission.   

## 2016-12-11 ENCOUNTER — Encounter: Payer: Self-pay | Admitting: Cardiology

## 2016-12-18 LAB — CUP PACEART REMOTE DEVICE CHECK
Battery Remaining Longevity: 27 mo
Battery Voltage: 2.96 V
Brady Statistic AP VP Percent: 26.48 %
Brady Statistic AS VS Percent: 0.1 %
Brady Statistic RA Percent Paced: 26.47 %
Date Time Interrogation Session: 20180708082708
Implantable Lead Implant Date: 20160705
Implantable Lead Location: 753858
Implantable Lead Model: 4968
Implantable Lead Model: 5071
Implantable Lead Model: 5071
Implantable Pulse Generator Implant Date: 20160705
Lead Channel Impedance Value: 4047 Ohm
Lead Channel Impedance Value: 494 Ohm
Lead Channel Impedance Value: 532 Ohm
Lead Channel Impedance Value: 798 Ohm
Lead Channel Pacing Threshold Amplitude: 2 V
Lead Channel Pacing Threshold Amplitude: 2.5 V
Lead Channel Pacing Threshold Pulse Width: 0.4 ms
Lead Channel Pacing Threshold Pulse Width: 1 ms
Lead Channel Sensing Intrinsic Amplitude: 12.25 mV
Lead Channel Sensing Intrinsic Amplitude: 12.25 mV
Lead Channel Sensing Intrinsic Amplitude: 3.875 mV
Lead Channel Setting Pacing Amplitude: 1.5 V
Lead Channel Setting Pacing Amplitude: 2.5 V
Lead Channel Setting Pacing Pulse Width: 1 ms
MDC IDC LEAD IMPLANT DT: 20160705
MDC IDC LEAD IMPLANT DT: 20160705
MDC IDC LEAD LOCATION: 753859
MDC IDC LEAD LOCATION: 753860
MDC IDC MSMT LEADCHNL LV IMPEDANCE VALUE: 342 Ohm
MDC IDC MSMT LEADCHNL LV IMPEDANCE VALUE: 4047 Ohm
MDC IDC MSMT LEADCHNL LV IMPEDANCE VALUE: 4047 Ohm
MDC IDC MSMT LEADCHNL LV IMPEDANCE VALUE: 4047 Ohm
MDC IDC MSMT LEADCHNL RA PACING THRESHOLD AMPLITUDE: 0.75 V
MDC IDC MSMT LEADCHNL RA PACING THRESHOLD PULSEWIDTH: 0.4 ms
MDC IDC MSMT LEADCHNL RA SENSING INTR AMPL: 3.875 mV
MDC IDC MSMT LEADCHNL RV IMPEDANCE VALUE: 4047 Ohm
MDC IDC SET LEADCHNL RV PACING AMPLITUDE: 4 V
MDC IDC SET LEADCHNL RV PACING PULSEWIDTH: 1 ms
MDC IDC SET LEADCHNL RV SENSING SENSITIVITY: 4 mV
MDC IDC STAT BRADY AP VS PERCENT: 0.01 %
MDC IDC STAT BRADY AS VP PERCENT: 73.4 %
MDC IDC STAT BRADY RV PERCENT PACED: 99.82 %

## 2017-01-09 ENCOUNTER — Encounter: Payer: Self-pay | Admitting: Internal Medicine

## 2017-01-09 NOTE — Telephone Encounter (Signed)
This encounter was created in error - please disregard.

## 2017-01-10 NOTE — Telephone Encounter (Signed)
Sleep study and OV note faxed to number provided. Nothing further needed.

## 2017-01-10 NOTE — Telephone Encounter (Signed)
Please send over notes and sleep study results to [facility] so patient can seek care there for fu    [facility]  [address]  [place] [address]   [phone] Fax  [phone] TELE

## 2017-01-12 DIAGNOSIS — G4733 Obstructive sleep apnea (adult) (pediatric): Secondary | ICD-10-CM | POA: Insufficient documentation

## 2017-01-12 DIAGNOSIS — J4540 Moderate persistent asthma, uncomplicated: Secondary | ICD-10-CM | POA: Insufficient documentation

## 2017-01-12 DIAGNOSIS — F429 Obsessive-compulsive disorder, unspecified: Secondary | ICD-10-CM | POA: Insufficient documentation

## 2017-02-01 ENCOUNTER — Ambulatory Visit (INDEPENDENT_AMBULATORY_CARE_PROVIDER_SITE_OTHER): Payer: Self-pay | Admitting: Cardiology

## 2017-02-01 ENCOUNTER — Encounter: Payer: Self-pay | Admitting: Cardiology

## 2017-02-01 DIAGNOSIS — F329 Major depressive disorder, single episode, unspecified: Secondary | ICD-10-CM

## 2017-02-01 DIAGNOSIS — I350 Nonrheumatic aortic (valve) stenosis: Secondary | ICD-10-CM

## 2017-02-01 DIAGNOSIS — R0600 Dyspnea, unspecified: Secondary | ICD-10-CM

## 2017-02-01 DIAGNOSIS — I5032 Chronic diastolic (congestive) heart failure: Secondary | ICD-10-CM

## 2017-02-01 DIAGNOSIS — E669 Obesity, unspecified: Secondary | ICD-10-CM

## 2017-02-01 DIAGNOSIS — Z950 Presence of cardiac pacemaker: Secondary | ICD-10-CM

## 2017-02-01 DIAGNOSIS — R0609 Other forms of dyspnea: Secondary | ICD-10-CM

## 2017-02-01 DIAGNOSIS — Z952 Presence of prosthetic heart valve: Secondary | ICD-10-CM

## 2017-02-01 DIAGNOSIS — I428 Other cardiomyopathies: Secondary | ICD-10-CM

## 2017-02-01 MED ORDER — ATORVASTATIN CALCIUM 20 MG PO TABS: 10.0000 mg | ORAL_TABLET | Freq: Every day | ORAL | 6 refills | Status: DC

## 2017-02-01 NOTE — Patient Instructions (Addendum)
MEDICATION CHANGES  TAKE METOPROLOL 12.5 MG ONCE A DAY FOR ONE WEEK THEN STOP  DECREASE ATORVASTATIN 1/2 TABLET OF 10 MG  ONCE A DAY.   Your physician encouraged you to lose weight for better health. Your physician discussed the importance of regular exercise and recommended that you start or continue a regular exercise program for good health.     RECOMMEND  Dr Rolm Gala-- [facility]  CARDIOLOGIST   Your physician wants you to follow-up in 6 MONTHS WITH DR HARDING. You will receive a reminder letter in the mail two months in advance. If you don't receive a letter, please call our office to schedule the follow-up appointment.

## 2017-02-01 NOTE — Progress Notes (Signed)
PCP: Etheleen Nicks, NP  Clinic Note: Chief Complaint  Patient presents with  . Follow-up    discuss pft test, no chest pain,   . Cardiac Valve Problem    Status post aortic valve replacement    HPI: Nicholas Horton is a 41 y.o. male with a PMH below who presents today for 6-7 month follow-up for valvular heart disease and cardiomyopathy. He had reduced EF on prior to his mechanical aortic valve replacement and pacemaker placement but subsequently his EF improved to 55% postop. Prior to his operation and required significant amount of dental work, and is having issues with getting his dentures going through the [facility]  He has been asking for work disability paperwork since his surgery.Nicholas Horton was last seen on 07/07/2016 --> still mildly short of breath with activity up to a mile and a half. Was still having a hard time losing weight. Still seemed quite depressed.  Recent Hospitalizations: Admitted for pneumonia back in May 2018  Studies Personally Reviewed - (if available, images/films reviewed: From Epic Chart or Care Everywhere)  Transthoracic echo April 2018: Normal EF 55-65%. Normal diastolic function. Mechanical aortic prosthesis in place with mild to moderate stenosis. Peak gradient 43 mmHg. Mean gradient 27 mmHg - stable  Interval History: Nicholas Horton presents today with his wife who seems to be somewhat frustrated with him. He did not speak at all during this visit basically allowing her to speak. She spoke quite bitterly about several things including his cheating on her after his surgery. He she talked about the fact that he has been verbally abusive to their child as well as her mother. He is not willing to try to do anything at home and continues to complain of exertional dyspnea. He is not taking the time to adjust his diet and stop doing the walking that he used to do. He gets short of breath doing just about anything, and especially stop doing things. He  does note fatigue no chest tightness pressure with rest or exertion. No real PND orthopnea just intermittent edema control with Lasix.   He is not doing anything enjoyable. He is not really paying attention to his eating. Doesn't sleep well. According to his wife, he has endorsed a desire to not go on living, but has not commented on active plans to hurt himself.  No palpitations, lightheadedness, dizziness, weakness or syncope/near syncope. No TIA/amaurosis fugax symptoms. No melena, hematochezia, hematuria, or epstaxis. No claudication. -- Apparently he had an abnormal sleep study and is supposed to be on CPAP of however he cannot afford this therapy.  He has yet to get any further on assistance to get dentures, therefore he still does not have teeth making it very difficult to eat. This may be one of the driving factors for his depression.  ROS: A comprehensive was performed. Review of Systems  Constitutional: Positive for malaise/fatigue. Negative for weight loss.  HENT: Negative for nosebleeds.   Respiratory: Positive for shortness of breath. Negative for cough and wheezing.   Gastrointestinal: Negative for blood in stool and melena.  Genitourinary: Negative for hematuria.  Musculoskeletal: Positive for back pain and joint pain.  Endo/Heme/Allergies: Positive for environmental allergies.  Psychiatric/Behavioral: Positive for depression and suicidal ideas. Negative for memory loss and substance abuse. The patient is nervous/anxious and has insomnia.        Seems to be acting out, verbally abusive, however has not been physically abusive. Endorses anhedonia, poor sleep and poor eating habits.  All other systems reviewed and are negative.  I have reviewed and (if needed) personally updated the patient's problem list, medications, allergies, past medical and surgical history, social and family history.   Past Medical History:  Diagnosis Date  . CHF (congestive heart failure) (HCC)   .  Congenital bicuspid aortic valve 09/2014   - s/p AVR  . Nonischemic cardiomyopathy (HCC) -- Resolved[I42.9] 09/2014   EF by Echo 20-25% (pre-op AVR) --> Echo 10/2014 & 03/2015: EF 50-55% - also Recent Normal Diastolic parameters   . S/P AVR (aortic valve replacement) 10/2014   21 mm Carbometrics Mechanical Valve -- Epicardial PPM Leads  . Severe aortic stenosis 10/04/2014   Presented with Syncope & CHF  . Third degree heart block (HCC) 10/04/2014   Transient CHB related to AS -- s/p PPM    Past Surgical History:  Procedure Laterality Date  . AORTIC VALVE REPLACEMENT N/A 12/01/2014   Procedure: AORTIC VALVE REPLACEMENT (AVR);  Surgeon: Kerin Perna, MD;  Location[facility] OR;  Service: Open Heart Surgery;  Laterality: N/A;  . CARDIAC CATHETERIZATION N/A 10/04/2014   Procedure: Left Heart Cath and Coronary Angiography;  Surgeon: Marykay Lex, MD;  Location: [facility] INVASIVE CV LAB;  Service: Cardiovascular;  Laterality: N/A;  . CARDIAC CATHETERIZATION  10/04/2014   Procedure: Temporary Pacemaker;  Surgeon: Marykay Lex, MD;  Location: [facility] INVASIVE CV LAB;  Service: Cardiovascular;;  . CARDIAC CATHETERIZATION N/A 11/25/2014   Procedure: Temporary Pacemaker;  Surgeon: Marinus Maw, MD;  Location: [facility] INVASIVE CV LAB;  Service: Cardiovascular;  Laterality: N/A;  . MULTIPLE EXTRACTIONS WITH ALVEOLOPLASTY N/A 11/25/2014   Procedure: Extraction of tooth #'s 1,2,3,4,5,6,7,9, 10, 11, 12,13,14,16, 19, 20, 21, 22, 23, 24, 25, 26, 27, 28, 29, 30, 31 with alveoloplasty;  Surgeon: Charlynne Pander, DDS;  Location: [facility] OR;  Service: Oral Surgery;  Laterality: N/A;  . SURGERY SCROTAL / TESTICULAR     for undescended testicle as a child  . TEE WITHOUT CARDIOVERSION N/A 12/01/2014   Procedure: TRANSESOPHAGEAL ECHOCARDIOGRAM (TEE);  Surgeon: Kerin Perna, MD;  Location: [facility] OR;  Service: Open Heart Surgery;  Laterality: N/A;  . TRANSTHORACIC ECHOCARDIOGRAM  03/2015   Mildly dilated LV. EF 50 at 55%. Normal diastolic  parameters. Mild to moderate stenosis of prosthetic aortic valve. Mild to moderate regurgitation    Current Meds  Medication Sig  . albuterol (PROVENTIL) (2.5 MG/3ML) 0.083% nebulizer solution Take 3 mLs (2.5 mg total) by nebulization every 6 (six) hours as needed for wheezing or shortness of breath.  Marland Kitchen aspirin EC 81 MG EC tablet Take 1 tablet (81 mg total) by mouth daily.  Marland Kitchen atorvastatin (LIPITOR) 20 MG tablet Take 0.5 tablets (10 mg total) by mouth daily.  . furosemide (LASIX) 40 MG tablet Take one tablet by mouth daily and may take an extra one dose additional if needed  . losartan (COZAAR) 25 MG tablet Take 1 tablet (25 mg total) by mouth daily.  . metFORMIN (GLUCOPHAGE) 500 MG tablet Take 500 mg by mouth 2 (two) times daily.  Marland Kitchen OLANZapine (ZYPREXA) 2.5 MG tablet Take 0.5 tablets (1.25 mg total) by mouth at bedtime. (Patient taking differently: Take 2.5 mg by mouth at bedtime. )  . ondansetron (ZOFRAN ODT) 8 MG disintegrating tablet Take 1 tablet (8 mg total) by mouth every 8 (eight) hours as needed for nausea or vomiting.  . warfarin (COUMADIN) 5 MG tablet TAKE AS DIRECTED BY COUMADIN CLINIC  . [DISCONTINUED] atorvastatin (LIPITOR) 20 MG tablet  Take 1 tablet (20 mg total) by mouth daily.  . [DISCONTINUED] metoprolol tartrate (LOPRESSOR) 25 MG tablet Take 0.5 tablets (12.5 mg total) by mouth 2 (two) times daily.    No Known Allergies  Social History   Social History  . Marital status: Single    Spouse name: N/A  . Number of children: N/A  . Years of education: N/A   Occupational History  . 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    Social History Main Topics  . Smoking status: Former Smoker    Packs/day: 2.00    Years: 20.00    Types: Cigarettes  . Smokeless tobacco: Never Used     Comment: quit 10/04/14  . Alcohol use No     Comment: rarely  . Drug use: No  . Sexual activity: Yes   Other Topics Concern  . None   Social History Narrative   Lives in [place], [place] with fiance.  No history of CAD or PPM, arrhythmia in parents or siblings. 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     family history is not on file.  Wt Readings from Last 3 Encounters:  02/01/17 259 lb (117.5 kg)  10/03/16 256 lb (116.1 kg)  09/20/16 260 lb (117.9 kg)    PHYSICAL EXAM BP 104/64   Pulse 72   Ht  (1.88 m)   Wt 259 lb (117.5 kg)   BMI 33.25 kg/m  Physical Exam  Constitutional: He appears well-developed and well-nourished. No distress.  Obese. Well groomed  HENT:  Head: Normocephalic and atraumatic.  Mouth/Throat: Oropharynx is clear and moist. No oropharyngeal exudate.  Edentulous  Neck: Normal range of motion. Neck supple. No hepatojugular reflux and no JVD present. Carotid bruit is not present.  Cardiovascular: Normal rate, regular rhythm, S1 normal and intact distal pulses.   Occasional extrasystoles are present. PMI is not displaced.  Exam reveals no gallop and no friction rub.   Murmur (2/6 mid-late peaking C-D SEM at RUSB; soft HSM at apex) heard. Metallic S2  Pulmonary/Chest: Effort normal and breath sounds normal. No respiratory distress. He has no wheezes.  Abdominal: Soft. Bowel sounds are normal. He exhibits no distension. There is no tenderness. There is no rebound.  Musculoskeletal: Normal range of motion. He exhibits edema (Trivial).  Neurological: He is alert.  Skin: Skin is warm and dry. No rash noted. He is not diaphoretic. No erythema.  Psychiatric: His affect is blunt and inappropriate. He is withdrawn. He expresses inappropriate judgment. He exhibits a depressed mood.  Blunted, flat affect. His head was down the entire time. Barely spoke to questions until the very end. Seems to have overall lack of motivation to improve. Wife indicates passive suicidal ideation, but no plans.    Adult ECG Report n/a  Other studies Reviewed: Additional studies/ records that were reviewed today include:  Recent Labs:    Lab Results  Component  Value Date   CHOL 178 11/22/2014   HDL 31 (L) 11/22/2014   LDLCALC 89 11/22/2014   TRIG 290 (H) 11/22/2014   CHOLHDL 5.7 11/22/2014     ASSESSMENT / PLAN: Problem List Items Addressed This Visit    Cardiac pacemaker in situ (Chronic)    Still following up with Dr. Graciela Husbands in pacemaker clinic.      Chronic diastolic heart failure (HCC) (Chronic)    Edema seems relatively stable on current dose of Lasix. He still uses occasional when necessary dosing. His blood pressure is borderline low and on  therefore wean off metoprolol to hopefully give him more energy.      Relevant Medications   atorvastatin (LIPITOR) 20 MG tablet   Depression (Chronic)    This is probably the worst I've seen him since I've known him. His wife was very blunt about her concerns. I'm concerned that if he does not adjust his lifestyle and make some changes that she will leave him which would be disastrous for him.  Needs to get in with his counselor and psychiatrist and figure out a plan. He did not have any active plans for hurting himself, but the fact that he is having some suicidal ideation and is becoming abusive he may need more aggressive therapy.      Dyspnea on exertion (Chronic)    This is a chronic issue along with his fatigue. I am weaning off his beta blocker to avoid any complication of chronotropic incompetence that should not be the case with pacemaker. He may very well as of COPD, and with recent diagnosis of OSA, this is also playing a role. I think is deconditioning and now obesity is definitely contributing.  I'm backing off on his atorvastatin and stopping metoprolol and who still avoid any medication Occasions.       Moderate to severe AS with bicuspid valve. Mean 33, peak 61 mmHg.    Status post aorta valve replacement. Scalp located by complete heart block leading to pacemaker placement      Relevant Medications   atorvastatin (LIPITOR) 20 MG tablet   Nonischemic cardiomyopathy-EF  50-55% echo 11/20/14 (Chronic)    His EF was essentially back to normal. I'm a little bit worried about fatigue and I'm planning to stop his Lopressor by weaning it off.  Continue low-dose ARB and standing dose of Lasix. He has not had to take additional doses.      Relevant Medications   atorvastatin (LIPITOR) 20 MG tablet   Obesity (BMI 30.0-34.9)    This is playing a large role in his dyspnea. It may be related to Zyprexa, but is probably simply related to his depression and lack of desire to do anything. Is not eating right and not exercising.  He continues to go to counseling and potentially to see a psychiatrist for medical management of his depression. Once that stable EKG and probably consider doing a diet.      S/P AVR (Chronic)    We will check an echocardiogram at next visit. Continues warfarin for anticoagulation. Lifelong SBE prophylaxis.  Still working on trying to get disability.         Not make changes were made today besides stopping one medicine and reducing to one half. However greater than 40 minutes was spent with the patient and his wife. Percent of this was spent in simply discussing his behavioral activities and financial constraints. We talked about how hopefully using some his medications will help improve the symptoms.  Current medicines are reviewed at length with the patient today. (+/- concerns) Still noting fatigue The following changes have been made:Wean off metoprolol and reduce atorvastatin dose to 10 mg.  Patient Instructions  MEDICATION CHANGES  TAKE METOPROLOL 12.5 MG ONCE A DAY FOR ONE WEEK THEN STOP  DECREASE ATORVASTATIN 1/2 TABLET OF 10 MG  ONCE A DAY.   Your physician encouraged you to lose weight for better health. Your physician discussed the importance of regular exercise and recommended that you start or continue a regular exercise program for good health.  RECOMMEND  Dr Rolm Gala-- [facility]  CARDIOLOGIST   Your physician wants  you to follow-up in 6 MONTHS WITH DR HARDING. You will receive a reminder letter in the mail two months in advance. If you don't receive a letter, please call our office to schedule the follow-up appointment.    Studies Ordered:   No orders of the defined types were placed in this encounter.     [name], M.D., M.S. Interventional Cardiologist   Pager # [phone] Phone # [phone] [address] [place], [place] [address]

## 2017-02-04 ENCOUNTER — Encounter: Payer: Self-pay | Admitting: Cardiology

## 2017-02-04 NOTE — Assessment & Plan Note (Signed)
This is probably the worst I've seen him since I've known him. His wife was very blunt about her concerns. I'm concerned that if he does not adjust his lifestyle and make some changes that she will leave him which would be disastrous for him.  Needs to get in with his counselor and psychiatrist and figure out a plan. He did not have any active plans for hurting himself, but the fact that he is having some suicidal ideation and is becoming abusive he may need more aggressive therapy.

## 2017-02-04 NOTE — Assessment & Plan Note (Signed)
Still following up with Dr. Graciela Husbands in pacemaker clinic.

## 2017-02-04 NOTE — Assessment & Plan Note (Signed)
This is a chronic issue along with his fatigue. I am weaning off his beta blocker to avoid any complication of chronotropic incompetence that should not be the case with pacemaker. He may very well as of COPD, and with recent diagnosis of OSA, this is also playing a role. I think is deconditioning and now obesity is definitely contributing.  I'm backing off on his atorvastatin and stopping metoprolol and who still avoid any medication Occasions.

## 2017-02-04 NOTE — Assessment & Plan Note (Signed)
We will check an echocardiogram at next visit. Continues warfarin for anticoagulation. Lifelong SBE prophylaxis.  Still working on trying to get disability.

## 2017-02-04 NOTE — Assessment & Plan Note (Signed)
His EF was essentially back to normal. I'm a little bit worried about fatigue and I'm planning to stop his Lopressor by weaning it off.  Continue low-dose ARB and standing dose of Lasix. He has not had to take additional doses.

## 2017-02-04 NOTE — Assessment & Plan Note (Signed)
Status post aorta valve replacement. Scalp located by complete heart block leading to pacemaker placement

## 2017-02-04 NOTE — Assessment & Plan Note (Signed)
This is playing a large role in his dyspnea. It may be related to Zyprexa, but is probably simply related to his depression and lack of desire to do anything. Is not eating right and not exercising.  He continues to go to counseling and potentially to see a psychiatrist for medical management of his depression. Once that stable EKG and probably consider doing a diet.

## 2017-02-04 NOTE — Assessment & Plan Note (Signed)
Edema seems relatively stable on current dose of Lasix. He still uses occasional when necessary dosing. His blood pressure is borderline low and on therefore wean off metoprolol to hopefully give him more energy.

## 2017-03-06 ENCOUNTER — Telehealth: Payer: Self-pay | Admitting: Cardiology

## 2017-03-06 ENCOUNTER — Ambulatory Visit (INDEPENDENT_AMBULATORY_CARE_PROVIDER_SITE_OTHER): Payer: Self-pay | Admitting: *Deleted

## 2017-03-06 DIAGNOSIS — I442 Atrioventricular block, complete: Secondary | ICD-10-CM

## 2017-03-06 NOTE — Telephone Encounter (Signed)
Spoke with pt and reminded pt of remote transmission that is due today. Pt verbalized understanding.   

## 2017-03-07 LAB — CUP PACEART REMOTE DEVICE CHECK
Battery Remaining Longevity: 23 mo
Battery Voltage: 2.95 V
Brady Statistic RA Percent Paced: 22.14 %
Brady Statistic RV Percent Paced: 99.91 %
Implantable Lead Implant Date: 20160705
Implantable Lead Implant Date: 20160705
Implantable Lead Implant Date: 20160705
Implantable Lead Location: 753858
Implantable Lead Location: 753859
Implantable Lead Location: 753860
Implantable Lead Model: 4968
Implantable Pulse Generator Implant Date: 20160705
Lead Channel Impedance Value: 4047 Ohm
Lead Channel Impedance Value: 4047 Ohm
Lead Channel Impedance Value: 4047 Ohm
Lead Channel Impedance Value: 551 Ohm
Lead Channel Impedance Value: 817 Ohm
Lead Channel Pacing Threshold Amplitude: 0.875 V
Lead Channel Pacing Threshold Amplitude: 2.5 V
Lead Channel Pacing Threshold Pulse Width: 0.4 ms
Lead Channel Sensing Intrinsic Amplitude: 4.5 mV
Lead Channel Sensing Intrinsic Amplitude: 4.5 mV
Lead Channel Setting Pacing Amplitude: 4 V
Lead Channel Setting Pacing Pulse Width: 1 ms
Lead Channel Setting Sensing Sensitivity: 4 mV
MDC IDC MSMT LEADCHNL LV IMPEDANCE VALUE: 361 Ohm
MDC IDC MSMT LEADCHNL LV IMPEDANCE VALUE: 4047 Ohm
MDC IDC MSMT LEADCHNL LV IMPEDANCE VALUE: 4047 Ohm
MDC IDC MSMT LEADCHNL LV PACING THRESHOLD AMPLITUDE: 1.875 V
MDC IDC MSMT LEADCHNL LV PACING THRESHOLD PULSEWIDTH: 1 ms
MDC IDC MSMT LEADCHNL RA PACING THRESHOLD PULSEWIDTH: 0.4 ms
MDC IDC MSMT LEADCHNL RV IMPEDANCE VALUE: 513 Ohm
MDC IDC MSMT LEADCHNL RV SENSING INTR AMPL: 12.25 mV
MDC IDC MSMT LEADCHNL RV SENSING INTR AMPL: 12.25 mV
MDC IDC SESS DTM: 20181009185603
MDC IDC SET LEADCHNL LV PACING AMPLITUDE: 2.5 V
MDC IDC SET LEADCHNL LV PACING PULSEWIDTH: 1 ms
MDC IDC SET LEADCHNL RA PACING AMPLITUDE: 1.75 V
MDC IDC STAT BRADY AP VP PERCENT: 22.15 %
MDC IDC STAT BRADY AP VS PERCENT: 0.01 %
MDC IDC STAT BRADY AS VP PERCENT: 77.79 %
MDC IDC STAT BRADY AS VS PERCENT: 0.05 %

## 2017-03-07 NOTE — Progress Notes (Signed)
Remote pacemaker transmission.   

## 2017-03-09 ENCOUNTER — Encounter: Payer: Self-pay | Admitting: Cardiology

## 2017-03-12 NOTE — Progress Notes (Signed)
Error

## 2017-05-14 DIAGNOSIS — F909 Attention-deficit hyperactivity disorder, unspecified type: Secondary | ICD-10-CM | POA: Insufficient documentation

## 2017-05-14 DIAGNOSIS — F259 Schizoaffective disorder, unspecified: Secondary | ICD-10-CM | POA: Insufficient documentation

## 2017-05-14 DIAGNOSIS — E1169 Type 2 diabetes mellitus with other specified complication: Secondary | ICD-10-CM | POA: Insufficient documentation

## 2017-06-05 ENCOUNTER — Ambulatory Visit (INDEPENDENT_AMBULATORY_CARE_PROVIDER_SITE_OTHER): Payer: Self-pay | Admitting: *Deleted

## 2017-06-05 DIAGNOSIS — I442 Atrioventricular block, complete: Secondary | ICD-10-CM

## 2017-06-05 NOTE — Progress Notes (Signed)
Remote pacemaker transmission.   

## 2017-06-06 ENCOUNTER — Encounter: Payer: Self-pay | Admitting: Cardiology

## 2017-06-06 LAB — CUP PACEART REMOTE DEVICE CHECK
Battery Remaining Longevity: 22 mo
Battery Voltage: 2.94 V
Brady Statistic AP VS Percent: 0.01 %
Brady Statistic AS VS Percent: 0.04 %
Brady Statistic RA Percent Paced: 0.57 %
Date Time Interrogation Session: 20190108100824
Implantable Lead Implant Date: 20160705
Implantable Lead Location: 753859
Implantable Lead Model: 4968
Implantable Lead Model: 5071
Implantable Lead Model: 5071
Lead Channel Impedance Value: 361 Ohm
Lead Channel Impedance Value: 4047 Ohm
Lead Channel Impedance Value: 4047 Ohm
Lead Channel Impedance Value: 513 Ohm
Lead Channel Impedance Value: 551 Ohm
Lead Channel Impedance Value: 855 Ohm
Lead Channel Pacing Threshold Amplitude: 1.75 V
Lead Channel Pacing Threshold Amplitude: 2.5 V
Lead Channel Pacing Threshold Pulse Width: 0.4 ms
Lead Channel Pacing Threshold Pulse Width: 1 ms
Lead Channel Sensing Intrinsic Amplitude: 12.25 mV
Lead Channel Sensing Intrinsic Amplitude: 12.25 mV
Lead Channel Setting Pacing Amplitude: 2.5 V
Lead Channel Setting Pacing Pulse Width: 1 ms
MDC IDC LEAD IMPLANT DT: 20160705
MDC IDC LEAD IMPLANT DT: 20160705
MDC IDC LEAD LOCATION: 753858
MDC IDC LEAD LOCATION: 753860
MDC IDC MSMT LEADCHNL LV IMPEDANCE VALUE: 4047 Ohm
MDC IDC MSMT LEADCHNL LV IMPEDANCE VALUE: 4047 Ohm
MDC IDC MSMT LEADCHNL LV IMPEDANCE VALUE: 4047 Ohm
MDC IDC MSMT LEADCHNL RA PACING THRESHOLD AMPLITUDE: 0.875 V
MDC IDC MSMT LEADCHNL RA PACING THRESHOLD PULSEWIDTH: 0.4 ms
MDC IDC MSMT LEADCHNL RA SENSING INTR AMPL: 4.625 mV
MDC IDC MSMT LEADCHNL RA SENSING INTR AMPL: 4.625 mV
MDC IDC PG IMPLANT DT: 20160705
MDC IDC SET LEADCHNL RA PACING AMPLITUDE: 1.75 V
MDC IDC SET LEADCHNL RV PACING AMPLITUDE: 4 V
MDC IDC SET LEADCHNL RV PACING PULSEWIDTH: 1 ms
MDC IDC SET LEADCHNL RV SENSING SENSITIVITY: 4 mV
MDC IDC STAT BRADY AP VP PERCENT: 0.57 %
MDC IDC STAT BRADY AS VP PERCENT: 99.39 %
MDC IDC STAT BRADY RV PERCENT PACED: 99.94 %

## 2017-07-10 ENCOUNTER — Encounter: Payer: Self-pay | Admitting: Cardiology

## 2017-08-07 ENCOUNTER — Encounter: Payer: Self-pay | Admitting: Cardiology

## 2017-08-07 ENCOUNTER — Ambulatory Visit (INDEPENDENT_AMBULATORY_CARE_PROVIDER_SITE_OTHER): Payer: Self-pay | Admitting: Cardiology

## 2017-08-07 VITALS — BP 139/91 | HR 98 | Ht 74.0 in | Wt 260.4 lb

## 2017-08-07 DIAGNOSIS — I5032 Chronic diastolic (congestive) heart failure: Secondary | ICD-10-CM

## 2017-08-07 DIAGNOSIS — Z950 Presence of cardiac pacemaker: Secondary | ICD-10-CM

## 2017-08-07 DIAGNOSIS — R0600 Dyspnea, unspecified: Secondary | ICD-10-CM

## 2017-08-07 DIAGNOSIS — I350 Nonrheumatic aortic (valve) stenosis: Secondary | ICD-10-CM

## 2017-08-07 DIAGNOSIS — I428 Other cardiomyopathies: Secondary | ICD-10-CM

## 2017-08-07 DIAGNOSIS — R0609 Other forms of dyspnea: Secondary | ICD-10-CM

## 2017-08-07 DIAGNOSIS — E785 Hyperlipidemia, unspecified: Secondary | ICD-10-CM

## 2017-08-07 DIAGNOSIS — R002 Palpitations: Secondary | ICD-10-CM

## 2017-08-07 DIAGNOSIS — I442 Atrioventricular block, complete: Secondary | ICD-10-CM

## 2017-08-07 DIAGNOSIS — Z952 Presence of prosthetic heart valve: Secondary | ICD-10-CM

## 2017-08-07 MED ORDER — AMOXICILLIN 500 MG PO CAPS: 2000.0000 mg | ORAL_CAPSULE | ORAL | 1 refills | Status: DC | PRN

## 2017-08-07 NOTE — Patient Instructions (Addendum)
MEDICATION INSTRUCTIONS  Amoxicillin 2 gm  Take one hour before any procedures  Below - prescription sent to pharmacy.   YOU WILL NEED SBE ( ENDOCARDITIS PROPHYLAXIS) WITH ANY FOLLOWING PROCEDURES AMOXICILLIN 2 GRAM  ONE HOUR PRIOR TO ANY  DENTAL , SURGICAL - RESPIRATORY , GI,, GENTIOURINARY  NO CHANGES WITH CURRENT MEDICATIONS   SCHEDULE AT [facility]  Your physician has requested that you have an echocardiogram. Echocardiography is a painless test that uses sound waves to create images of your heart. It provides your doctor with information about the size and shape of your heart and how well your heart's chambers and valves are working. This procedure takes approximately one hour. There are no restrictions for this procedure.    Your physician wants you to follow-up in 6 MONTHS WITH DR HARDING. You will receive a reminder letter in the mail two months in advance. If you don't receive a letter, please call our office to schedule the follow-up appointment.   If you need a refill on your cardiac medications before your next appointment, please call your pharmacy.

## 2017-08-07 NOTE — Progress Notes (Signed)
PCP: Etheleen Nicks, NP  Clinic Note: Chief Complaint  Patient presents with  . Follow-up    History of aortic valve repair    HPI: Nicholas Horton is a 42 y.o. male with a PMH below who presents today for 6-7 month follow-up for valvular heart disease and cardiomyopathy. He had reduced EF on prior to his mechanical aortic valve replacement and pacemaker placement but subsequently his EF improved to 55% postop. Prior to his operation and required significant amount of dental work, and is having issues with getting his dentures going through the [facility] dentistry  He has been asking for work disability paperwork02/01/2017 --> still mildly short of breath with activity up to a mile and a half. Was still having a hard time losing weight. Still seemed quite depressed since his surgery.Nicholas Horton was last seen 6 in September 2018  Recent Hospitalizations: Admitted for pneumonia back in May 2018  Studies Personally Reviewed - (if available, images/films reviewed: From Epic Chart or Care Everywhere)  Transthoracic echo April 2018: Normal EF 55-65%. Normal diastolic function. Mechanical aortic prosthesis in place with mild to moderate stenosis. Peak gradient 43 mmHg. Mean gradient 27 mmHg - stable  Interval History: Nicholas Horton presents today actually doing pretty well.  He has gotten a lot of his psychiatric issues better controlled and is doing better from that standpoint.  He is a more talkative today.  He says that his current primary provider needs a letter from cardiology saying he is okay taking testosterone.  This clinic note should suffice indicating that reestablishing a normal testosterone level would probably help him symptomatically which would probably outweigh any adverse effects of taking hormone supplementation.  Today Nicholas Horton is feeling relatively stable.  He is not all active, mostly because he is lazy.  He states that he feels like his heart rate will go up quite a bit off  and on that, makes him anxious, but he is not had felt it being very fast and not irregular.  He denies any real chest tightness or pressure with rest or exertion, but does have exertional dyspnea, mostly because of his now being obese and deconditioned. No PND, orthopnea or edema.  No syncope or near syncope, TIA or amaurosis fugax symptoms. No claudication.  Is hoping to get his teeth issues resolved and dealt with through the [facility] program.  -- Apparently he had an abnormal sleep study and is supposed to be on CPAP of however he cannot afford this therapy.   ROS: A comprehensive was performed. Review of Systems  Constitutional: Positive for malaise/fatigue. Negative for weight loss.  HENT: Negative for nosebleeds.   Respiratory: Negative for cough and wheezing.   Gastrointestinal: Negative for blood in stool and melena.  Genitourinary: Negative for hematuria.  Musculoskeletal: Positive for back pain and joint pain. Negative for falls.  Endo/Heme/Allergies: Positive for environmental allergies.  Psychiatric/Behavioral: Positive for depression and suicidal ideas. Negative for memory loss and substance abuse. The patient is nervous/anxious and has insomnia.        Seems to be acting out, verbally abusive, however has not been physically abusive. Endorses anhedonia, poor sleep and poor eating habits.  All other systems reviewed and are negative.  I have reviewed and (if needed) personally updated the patient's problem list, medications, allergies, past medical and surgical history, social and family history.   Past Medical History:  Diagnosis Date  . CHF (congestive heart failure) (HCC)   . Congenital bicuspid aortic valve 09/2014   -  s/p AVR  . Nonischemic cardiomyopathy (HCC) -- Resolved[I42.9] 09/2014   EF by Echo 20-25% (pre-op AVR) --> Echo 10/2014 & 03/2015: EF 50-55% - also Recent Normal Diastolic parameters   . S/P AVR (aortic valve replacement) 10/2014   21 mm Carbometrics  Mechanical Valve -- Epicardial PPM Leads  . Severe aortic stenosis 10/04/2014   Presented with Syncope & CHF  . Third degree heart block (HCC) 10/04/2014   Transient CHB related to AS -- s/p PPM    Past Surgical History:  Procedure Laterality Date  . AORTIC VALVE REPLACEMENT N/A 12/01/2014   Procedure: AORTIC VALVE REPLACEMENT (AVR);  Surgeon: Kerin Perna, MD;  Location[facility] OR;  Service: Open Heart Surgery;  Laterality: N/A;  . CARDIAC CATHETERIZATION N/A 10/04/2014   Procedure: Left Heart Cath and Coronary Angiography;  Surgeon: Marykay Lex, MD;  Location: [facility] INVASIVE CV LAB;  Service: Cardiovascular;  Laterality: N/A;  . CARDIAC CATHETERIZATION  10/04/2014   Procedure: Temporary Pacemaker;  Surgeon: Marykay Lex, MD;  Location: [facility] INVASIVE CV LAB;  Service: Cardiovascular;;  . CARDIAC CATHETERIZATION N/A 11/25/2014   Procedure: Temporary Pacemaker;  Surgeon: Marinus Maw, MD;  Location: [facility] INVASIVE CV LAB;  Service: Cardiovascular;  Laterality: N/A;  . MULTIPLE EXTRACTIONS WITH ALVEOLOPLASTY N/A 11/25/2014   Procedure: Extraction of tooth #'s 1,2,3,4,5,6,7,9, 10, 11, 12,13,14,16, 19, 20, 21, 22, 23, 24, 25, 26, 27, 28, 29, 30, 31 with alveoloplasty;  Surgeon: Charlynne Pander, DDS;  Location: [facility] OR;  Service: Oral Surgery;  Laterality: N/A;  . SURGERY SCROTAL / TESTICULAR     for undescended testicle as a child  . TEE WITHOUT CARDIOVERSION N/A 12/01/2014   Procedure: TRANSESOPHAGEAL ECHOCARDIOGRAM (TEE);  Surgeon: Kerin Perna, MD;  Location: [facility] OR;  Service: Open Heart Surgery;  Laterality: N/A;  . TRANSTHORACIC ECHOCARDIOGRAM  03/2015   Mildly dilated LV. EF 50 at 55%. Normal diastolic parameters. Mild to moderate stenosis of prosthetic aortic valve. Mild to moderate regurgitation    Current Meds  Medication Sig  . albuterol (PROVENTIL) (2.5 MG/3ML) 0.083% nebulizer solution Take 3 mLs (2.5 mg total) by nebulization every 6 (six) hours as needed for wheezing or shortness of  breath.  Marland Kitchen aspirin EC 81 MG EC tablet Take 1 tablet (81 mg total) by mouth daily.  Marland Kitchen atorvastatin (LIPITOR) 20 MG tablet Take 0.5 tablets (10 mg total) by mouth daily.  . BuPROPion HCl (WELLBUTRIN PO) Take by mouth.  . furosemide (LASIX) 40 MG tablet Take one tablet by mouth daily and may take an extra one dose additional if needed  . OLANZapine (ZYPREXA) 2.5 MG tablet Take 0.5 tablets (1.25 mg total) by mouth at bedtime. (Patient taking differently: Take 2.5 mg by mouth at bedtime. )  . ondansetron (ZOFRAN ODT) 8 MG disintegrating tablet Take 1 tablet (8 mg total) by mouth every 8 (eight) hours as needed for nausea or vomiting.  . warfarin (COUMADIN) 5 MG tablet TAKE AS DIRECTED BY COUMADIN CLINIC    No Known Allergies  Social History   Tobacco Use  . Smoking status: Former Smoker    Packs/day: 2.00    Years: 20.00    Pack years: 40.00    Types: Cigarettes  . Smokeless tobacco: Never Used  . Tobacco comment: quit 10/04/14  Substance Use Topics  . Alcohol use: No    Alcohol/week: 0.0 oz    Comment: rarely  . Drug use: No   Social History   Social History Narrative  Lives in [place], [place] with wife and daughter.    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    family history is not on file.  Wt Readings from Last 3 Encounters:  08/07/17 260 lb 6.4 oz (118.1 kg)  02/01/17 259 lb (117.5 kg)  10/03/16 256 lb (116.1 kg)    PHYSICAL EXAM BP (!) 139/91   Pulse 98   Ht 6\' 2"  (1.88 m)   Wt 260 lb 6.4 oz (118.1 kg)   BMI 33.43 kg/m  Physical Exam  Constitutional: He is oriented to person, place, and time. He appears well-developed and well-nourished. No distress.  Obese. Well groomed  HENT:  Head: Normocephalic and atraumatic.  Mouth/Throat: Oropharynx is clear and moist. No oropharyngeal exudate.  Edentulous  Neck: Normal range of motion. Neck supple. No hepatojugular reflux and no JVD present. Carotid bruit is not present.  Cardiovascular:  Normal rate, regular rhythm, S1 normal and intact distal pulses.  Occasional extrasystoles are present. PMI is not displaced. Exam reveals no gallop and no friction rub.  Murmur (2/6 mid-late peaking C-D SEM at RUSB; soft HSM at apex) heard. Metallic S2  Pulmonary/Chest: Effort normal and breath sounds normal. No respiratory distress. He has no wheezes.  Abdominal: Soft. Bowel sounds are normal. He exhibits no distension. There is no tenderness. There is no rebound.  Musculoskeletal: Normal range of motion. He exhibits no edema (Trivial).  Neurological: He is alert and oriented to person, place, and time.  Skin: Skin is warm and dry. No rash noted. He is not diaphoretic. No erythema.  Psychiatric: Thought content normal. His affect is blunt and inappropriate. He is withdrawn. He expresses inappropriate judgment. He exhibits a depressed mood.  He still has a blunted/ flat affect.  He keeps his head was down the entire time. Barely spoke to questions until the very end. Seems to have overall lack of motivation to improve. No longer concern for suicidal ideation.    Adult ECG Report A sensed-V paced.  90 bpm.  Otherwise normal  Other studies Reviewed: Additional studies/ records that were reviewed today include:  Recent Labs:   No recent labs  ASSESSMENT / PLAN: Problem List Items Addressed This Visit    Third degree heart block (HCC) (Chronic)   S/P AVR - Primary (Chronic)    Follow-up echo showed well seated mechanical prosthesis with appropriate gradients.  Would be read as mild stenosis continues to be on.  Warfarin for anticoagulation --no active bleeding.  We will give standing prescription for amoxicillin for SBE prophylaxis.  Also reiterated the importance of having blood cultures drawn in the setting of any concern for possible bacteremia from a bad bacterial illness such as cellulitis, pneumonia etc.  Check blood cultures prior to antibiotics.  He is due for follow-up  echocardiogram --> will order to be done in [place].      Relevant Orders   EKG 12-Lead (Completed)   ECHOCARDIOGRAM COMPLETE   Nonischemic cardiomyopathy-EF 50-55% echo 11/20/14 (Chronic)    Follow-up echo again shows preserved EF now 55-60%.  Probably has some diastolic dysfunction.  Remains on ARB for afterload reduction along with Lasix for diuresis.      Relevant Orders   EKG 12-Lead (Completed)   ECHOCARDIOGRAM COMPLETE   Moderate to severe AS with bicuspid valve. Mean 33, peak 61 mmHg. (Chronic)    S/p AVR this was complicated by --symptomatic bradycardia/complete heart block leading to pacemaker placement. Following this episode, Danh has really not gotten  back into his life.  He never really got back into working, was counseled to try to get disability.  I have been trying to encourage him to try to get back into the workforce, but have not been very successful.  Psychiatric issues seem to be a major issue.      Heart palpitations (Chronic)    He does have palpitations, but they do not seem to be bothering him that much right now.  I would prefer to avoid beta-blocker because it seems to be helping his energy level.      Relevant Orders   EKG 12-Lead (Completed)   ECHOCARDIOGRAM COMPLETE   Dyspnea on exertion (Chronic)    Chronic issue.  His echo looks good so that is unlikely to be cardiac issue.  I think is probably related to obesity and possible OSA as well as COPD.  Deconditioning clearly also playing a role.  Restart beta-blocker already and backed off atorvastatin and there has not been Much of an appreciable difference.  He needs to get exercising and active.        Dyslipidemia (Chronic)    Remains on atorvastatin.  I have not seen labs recently.Remains on atorvastatin.  I have not seen labs recently. Target LDL should be around 100      Chronic diastolic heart failure (HCC) (Chronic)    Remains euvolemic.  Has low-dose Lasix and has not had to take any  additional dose.  Is on ARB for afterload reduction with no complaints.  No longer on beta-blocker.  Weaned off to allow for energy levels to increase.      Cardiac pacemaker in situ (Chronic)    Reluctantly still following up with Dr. Sherryl Manges for pacemaker evaluation.         Current medicines are reviewed at length with the patient today. (+/- concerns) Still noting fatigue The following changes have been made: --Okay to supplementation of testosterone treat with per PCP.  Target testosterone level should be low normal.  Patient Instructions   MEDICATION INSTRUCTIONS  Amoxicillin 2 gm  Take one hour before any procedures  Below - prescription sent to pharmacy.   YOU WILL NEED SBE ( ENDOCARDITIS PROPHYLAXIS) WITH ANY FOLLOWING PROCEDURES AMOXICILLIN 2 GRAM  ONE HOUR PRIOR TO ANY  DENTAL , SURGICAL - RESPIRATORY , GI,, GENTIOURINARY  NO CHANGES WITH CURRENT MEDICATIONS   SCHEDULE AT [facility]  Your physician has requested that you have an echocardiogram. Echocardiography is a painless test that uses sound waves to create images of your heart. It provides your doctor with information about the size and shape of your heart and how well your heart's chambers and valves are working. This procedure takes approximately one hour. There are no restrictions for this procedure.    Your physician wants you to follow-up in 6 MONTHS WITH DR Anedra Penafiel. You will receive a reminder letter in the mail two months in advance. If you don't receive a letter, please call our office to schedule the follow-up appointment.   If you need a refill on your cardiac medications before your next appointment, please call your pharmacy.    Studies Ordered:   Orders Placed This Encounter  Procedures  . EKG 12-Lead  . ECHOCARDIOGRAM COMPLETE      [name], M.D., M.S. Interventional Cardiologist   Pager # [phone] Phone # [phone] [address] [place], [place]  [address]

## 2017-08-09 ENCOUNTER — Encounter: Payer: Self-pay | Admitting: Cardiology

## 2017-08-09 NOTE — Assessment & Plan Note (Signed)
S/p AVR this was complicated by --symptomatic bradycardia/complete heart block leading to pacemaker placement. Following this episode, Nicholas Horton has really not gotten back into his life.  He never really got back into working, was counseled to try to get disability.  I have been trying to encourage him to try to get back into the workforce, but have not been very successful.  Psychiatric issues seem to be a major issue.

## 2017-08-09 NOTE — Assessment & Plan Note (Signed)
Remains euvolemic.  Has low-dose Lasix and has not had to take any additional dose.  Is on ARB for afterload reduction with no complaints.  No longer on beta-blocker.  Weaned off to allow for energy levels to increase.

## 2017-08-09 NOTE — Assessment & Plan Note (Addendum)
Follow-up echo showed well seated mechanical prosthesis with appropriate gradients.  Would be read as mild stenosis continues to be on.  Warfarin for anticoagulation --no active bleeding.  We will give standing prescription for amoxicillin for SBE prophylaxis.  Also reiterated the importance of having blood cultures drawn in the setting of any concern for possible bacteremia from a bad bacterial illness such as cellulitis, pneumonia etc.  Check blood cultures prior to antibiotics.  He is due for follow-up echocardiogram --> will order to be done in [place].

## 2017-08-10 NOTE — Assessment & Plan Note (Signed)
Reluctantly still following up with Dr. Sherryl Manges for pacemaker evaluation.

## 2017-08-10 NOTE — Assessment & Plan Note (Signed)
Follow-up echo again shows preserved EF now 55-60%.  Probably has some diastolic dysfunction.  Remains on ARB for afterload reduction along with Lasix for diuresis.

## 2017-08-10 NOTE — Assessment & Plan Note (Addendum)
Remains on atorvastatin.  I have not seen labs recently.Remains on atorvastatin.  I have not seen labs recently. Target LDL should be around 100

## 2017-08-10 NOTE — Assessment & Plan Note (Signed)
Chronic issue.  His echo looks good so that is unlikely to be cardiac issue.  I think is probably related to obesity and possible OSA as well as COPD.  Deconditioning clearly also playing a role.  Restart beta-blocker already and backed off atorvastatin and there has not been Much of an appreciable difference.  He needs to get exercising and active.

## 2017-08-10 NOTE — Assessment & Plan Note (Signed)
He does have palpitations, but they do not seem to be bothering him that much right now.  I would prefer to avoid beta-blocker because it seems to be helping his energy level.

## 2017-08-27 HISTORY — PX: TRANSTHORACIC ECHOCARDIOGRAM: SHX275

## 2017-08-28 ENCOUNTER — Ambulatory Visit (INDEPENDENT_AMBULATORY_CARE_PROVIDER_SITE_OTHER): Payer: Self-pay

## 2017-08-28 ENCOUNTER — Other Ambulatory Visit: Payer: Self-pay

## 2017-08-28 DIAGNOSIS — Z952 Presence of prosthetic heart valve: Secondary | ICD-10-CM

## 2017-08-28 DIAGNOSIS — R002 Palpitations: Secondary | ICD-10-CM

## 2017-08-28 DIAGNOSIS — I428 Other cardiomyopathies: Secondary | ICD-10-CM

## 2017-09-04 ENCOUNTER — Ambulatory Visit (INDEPENDENT_AMBULATORY_CARE_PROVIDER_SITE_OTHER): Payer: Self-pay | Admitting: *Deleted

## 2017-09-04 DIAGNOSIS — I442 Atrioventricular block, complete: Secondary | ICD-10-CM

## 2017-09-04 NOTE — Progress Notes (Signed)
Remote pacemaker transmission.   

## 2017-09-06 ENCOUNTER — Encounter: Payer: Self-pay | Admitting: Cardiology

## 2017-09-07 ENCOUNTER — Telehealth: Payer: Self-pay | Admitting: Internal Medicine

## 2017-09-07 ENCOUNTER — Telehealth: Payer: Self-pay

## 2017-09-07 NOTE — Telephone Encounter (Signed)
Patient of Dr Herbie Baltimore. Routing to NL triage.

## 2017-09-07 NOTE — Telephone Encounter (Signed)
Returned call to girlfriend (ok per DPR)-she was wondering if patient could get started with cardiac rehab and wants to start getting his INR checked in the [place] office.   Girlfriend states patient is close to getting insurance and will all when he has this as she is aware rehab will be processed through them.   Also advised I would send message to [facility] office to schedule INR check.   She states patient is due again 4/18.  Routed to primary nurse to make aware.

## 2017-09-07 NOTE — Telephone Encounter (Signed)
-----   Message from Harvel Ricks, RN sent at 09/07/2017  2:56 PM EDT ----- Regarding: INR Patient request INR to be followed at [facility] office.  States he is due for next check 4/18.  This has been followed by PCP prior.      Please call to schedule.   Thanks!

## 2017-09-07 NOTE — Telephone Encounter (Signed)
Attempted to contact for scheduling.  No ans no vm

## 2017-09-07 NOTE — Telephone Encounter (Signed)
Pt girlfriend calling stating Primary Care provider suggested patient to start Cardiac rehab based on patient ejection fraction. They would like to see if we can please help patient set this up  Please advise

## 2017-09-11 NOTE — Telephone Encounter (Signed)
Pt scheduled 09/12/17

## 2017-09-12 ENCOUNTER — Ambulatory Visit (INDEPENDENT_AMBULATORY_CARE_PROVIDER_SITE_OTHER): Payer: Self-pay

## 2017-09-12 DIAGNOSIS — Z952 Presence of prosthetic heart valve: Secondary | ICD-10-CM

## 2017-09-12 DIAGNOSIS — Z5181 Encounter for therapeutic drug level monitoring: Secondary | ICD-10-CM

## 2017-09-12 LAB — POCT INR: INR: 1.2

## 2017-09-12 NOTE — Patient Instructions (Signed)
Please take 2 tablets tonight and tonight, then resume dosage of 1 tablet daily except 1.5 on Sundays, Tuesdays, Thursdays. Recheck in 2 weeks.

## 2017-09-25 LAB — CUP PACEART REMOTE DEVICE CHECK
Brady Statistic AP VP Percent: 0.61 %
Brady Statistic AP VS Percent: 0.01 %
Brady Statistic AS VP Percent: 99.35 %
Brady Statistic AS VS Percent: 0.03 %
Implantable Lead Implant Date: 20160705
Implantable Lead Location: 753859
Implantable Lead Model: 5071
Implantable Pulse Generator Implant Date: 20160705
Lead Channel Impedance Value: 361 Ohm
Lead Channel Impedance Value: 4047 Ohm
Lead Channel Impedance Value: 4047 Ohm
Lead Channel Impedance Value: 513 Ohm
Lead Channel Impedance Value: 551 Ohm
Lead Channel Impedance Value: 836 Ohm
Lead Channel Pacing Threshold Amplitude: 0.875 V
Lead Channel Pacing Threshold Amplitude: 2 V
Lead Channel Pacing Threshold Pulse Width: 0.4 ms
Lead Channel Pacing Threshold Pulse Width: 1 ms
Lead Channel Sensing Intrinsic Amplitude: 12.25 mV
Lead Channel Sensing Intrinsic Amplitude: 12.25 mV
Lead Channel Setting Pacing Amplitude: 1.75 V
Lead Channel Setting Pacing Amplitude: 2.5 V
Lead Channel Setting Pacing Pulse Width: 1 ms
Lead Channel Setting Pacing Pulse Width: 1 ms
MDC IDC LEAD IMPLANT DT: 20160705
MDC IDC LEAD IMPLANT DT: 20160705
MDC IDC LEAD LOCATION: 753858
MDC IDC LEAD LOCATION: 753860
MDC IDC MSMT BATTERY REMAINING LONGEVITY: 19 mo
MDC IDC MSMT BATTERY VOLTAGE: 2.93 V
MDC IDC MSMT LEADCHNL LV IMPEDANCE VALUE: 4047 Ohm
MDC IDC MSMT LEADCHNL LV IMPEDANCE VALUE: 4047 Ohm
MDC IDC MSMT LEADCHNL RA SENSING INTR AMPL: 4.125 mV
MDC IDC MSMT LEADCHNL RA SENSING INTR AMPL: 4.125 mV
MDC IDC MSMT LEADCHNL RV IMPEDANCE VALUE: 4047 Ohm
MDC IDC MSMT LEADCHNL RV PACING THRESHOLD AMPLITUDE: 2.5 V
MDC IDC MSMT LEADCHNL RV PACING THRESHOLD PULSEWIDTH: 0.4 ms
MDC IDC SESS DTM: 20190409084455
MDC IDC SET LEADCHNL RV PACING AMPLITUDE: 4 V
MDC IDC SET LEADCHNL RV SENSING SENSITIVITY: 4 mV
MDC IDC STAT BRADY RA PERCENT PACED: 0.62 %
MDC IDC STAT BRADY RV PERCENT PACED: 99.96 %

## 2017-10-01 ENCOUNTER — Telehealth: Payer: Self-pay | Admitting: Internal Medicine

## 2017-10-01 NOTE — Telephone Encounter (Signed)
lmov to schedule appt  °

## 2017-10-01 NOTE — Telephone Encounter (Signed)
-----   Message from [place], [employer] sent at 09/30/2017 11:14 PM EDT -----    Appointment Request From: Nicholas Horton    With Provider: Sherryl Manges, MD [facility] [place]]    Preferred Date Range: Any    Preferred Times: Any time    Reason for visit: Request an Appointment    Comments:  Pacemaker update

## 2017-10-01 NOTE — Telephone Encounter (Signed)
Pt scheduled nothing else needed. ° °

## 2017-10-16 ENCOUNTER — Encounter: Payer: Self-pay | Admitting: Internal Medicine

## 2017-10-16 ENCOUNTER — Ambulatory Visit (INDEPENDENT_AMBULATORY_CARE_PROVIDER_SITE_OTHER): Payer: Self-pay | Admitting: Internal Medicine

## 2017-10-16 VITALS — BP 124/72 | HR 78 | Ht 74.0 in | Wt 261.0 lb

## 2017-10-16 DIAGNOSIS — I442 Atrioventricular block, complete: Secondary | ICD-10-CM

## 2017-10-16 DIAGNOSIS — Z950 Presence of cardiac pacemaker: Secondary | ICD-10-CM

## 2017-10-16 NOTE — Progress Notes (Signed)
Patient Care Team: Etheleen Nicks, NP as PCP - General   HPI  Nicholas Horton is a 42 y.o. male Seen for pacemaker implantation 7/16-CRT P done in conjunction with aortic valve replacement and epicardial LV and RV/atrial  unipolar lead.  When initially evaluated with complete heart block May 2016 his ejection fraction was 20-25%. A month later  the ejection fraction was remeasured at 50-55% and severe aortic stenosis was appreciated and he underwent valve replacement   DATE TEST EF   5/16 Echo   20-25 %   6/16 Echo   50-55 %   4/18 Echo  55%   4/19 Echo  50%      Has been diagnosed with sleep apnea and is on therapy.  Has struggled with depression and now on anti-psychotic--currently disabled  NOT active   Records and Results Reviewed As above  Date Cr K  3/19 0.88 3.8  /       Past Medical History:  Diagnosis Date  . CHF (congestive heart failure) (HCC)   . Congenital bicuspid aortic valve 09/2014   - s/p AVR  . Nonischemic cardiomyopathy (HCC) -- Resolved[I42.9] 09/2014   EF by Echo 20-25% (pre-op AVR) --> Echo 10/2014 & 03/2015: EF 50-55% - also Recent Normal Diastolic parameters   . S/P AVR (aortic valve replacement) 10/2014   21 mm Carbometrics Mechanical Valve -- Epicardial PPM Leads  . Severe aortic stenosis 10/04/2014   Presented with Syncope & CHF  . Third degree heart block (HCC) 10/04/2014   Transient CHB related to AS -- s/p PPM    Past Surgical History:  Procedure Laterality Date  . AORTIC VALVE REPLACEMENT N/A 12/01/2014   Procedure: AORTIC VALVE REPLACEMENT (AVR);  Surgeon: Kerin Perna, MD;  Location: [facility] OR;  Service: Open Heart Surgery;  Laterality: N/A;  . CARDIAC CATHETERIZATION N/A 10/04/2014   Procedure: Left Heart Cath and Coronary Angiography;  Surgeon: Marykay Lex, MD;  Location: [facility] INVASIVE CV LAB;  Service: Cardiovascular;  Laterality: N/A;  . CARDIAC CATHETERIZATION  10/04/2014   Procedure: Temporary Pacemaker;  Surgeon: Marykay Lex, MD;  Location: [facility] INVASIVE CV LAB;  Service: Cardiovascular;;  . CARDIAC CATHETERIZATION N/A 11/25/2014   Procedure: Temporary Pacemaker;  Surgeon: Marinus Maw, MD;  Location: [facility] INVASIVE CV LAB;  Service: Cardiovascular;  Laterality: N/A;  . MULTIPLE EXTRACTIONS WITH ALVEOLOPLASTY N/A 11/25/2014   Procedure: Extraction of tooth #'s 1,2,3,4,5,6,7,9, 10, 11, 12,13,14,16, 19, 20, 21, 22, 23, 24, 25, 26, 27, 28, 29, 30, 31 with alveoloplasty;  Surgeon: Charlynne Pander, DDS;  Location: [facility] OR;  Service: Oral Surgery;  Laterality: N/A;  . SURGERY SCROTAL / TESTICULAR     for undescended testicle as a child  . TEE WITHOUT CARDIOVERSION N/A 12/01/2014   Procedure: TRANSESOPHAGEAL ECHOCARDIOGRAM (TEE);  Surgeon: Kerin Perna, MD;  Location: [facility] OR;  Service: Open Heart Surgery;  Laterality: N/A;  . TRANSTHORACIC ECHOCARDIOGRAM  03/2015   Mildly dilated LV. EF 50 at 55%. Normal diastolic parameters. Mild to moderate stenosis of prosthetic aortic valve. Mild to moderate regurgitation    Current Outpatient Medications  Medication Sig Dispense Refill  . amoxicillin (AMOXIL) 500 MG capsule Take 4 capsules (2,000 mg total) by mouth as needed. One hour prior to dental procedure 12 capsule 1  . aspirin EC 81 MG EC tablet Take 1 tablet (81 mg total) by mouth daily. 30 tablet 1  . atorvastatin (LIPITOR) 20 MG tablet Take 0.5 tablets (  10 mg total) by mouth daily. 30 tablet 6  . BuPROPion HCl (WELLBUTRIN PO) Take by mouth.    . dicyclomine (BENTYL) 10 MG capsule Take by mouth.    . furosemide (LASIX) 40 MG tablet Take one tablet by mouth daily and may take an extra one dose additional if needed 60 tablet 11  . ondansetron (ZOFRAN ODT) 8 MG disintegrating tablet Take 1 tablet (8 mg total) by mouth every 8 (eight) hours as needed for nausea or vomiting. 20 tablet 0  . umeclidinium-vilanterol (ANORO ELLIPTA) 62.5-25 MCG/INH AEPB Inhale into the lungs.    . warfarin (COUMADIN) 5 MG tablet TAKE AS DIRECTED  BY COUMADIN CLINIC 30 tablet 2  . losartan (COZAAR) 25 MG tablet Take 1 tablet (25 mg total) by mouth daily. 30 tablet 11  . metFORMIN (GLUCOPHAGE) 500 MG tablet Take 500 mg by mouth 2 (two) times daily.    Marland Kitchen OLANZapine (ZYPREXA) 2.5 MG tablet Take 0.5 tablets (1.25 mg total) by mouth at bedtime. (Patient taking differently: Take 2.5 mg by mouth at bedtime. ) 30 tablet 0   No current facility-administered medications for this visit.     No Known Allergies    Review of Systems negative except from HPI and PMH  Physical Exam BP 124/72 (BP Location: Left Arm, Patient Position: Sitting, Cuff Size: Normal)   Pulse 78   Ht 6\' 2"  (1.88 m)   Wt 261 lb (118.4 kg)   BMI 33.51 kg/m  Well developed and obese in no acute distress HENT normal Neck supple with JVP-flat Clear Regular rate and rhythm, mechanical S2 with a 2/6 murmur Abd-soft with active BS No Clubbing cyanosis edema Skin-warm and dry A & Oriented  Grossly normal sensory and motor function Affect flat  ECG demonstrates P synchronous pacing with a negative QRS lead I and an RS lead V1 Intervals 16/14/43    Assessment and  Plan  Complete heart block  CRT P-[employer]  High RV greater than LV pacing thresholds  Dyspnea on exertion  Sleep apnea  Depression   ( from 2016 note) There is a problem with his unipolar RV greater than LV lead with high pacing thresholds. This currently translates into an estimated longevity of his device of 2 years. For right now, I do not think that it is worth operative intervention; I reviewed this with the patient. I think however, given the high RV outputs that at the time of generator replacement endovascular lead should be placed. An LV endovascular lead may also be appropriate depending on LV pacing thresholds.  LV function is stable.  Encouraged exercise and weight loss.  Discussed depression the importance of being active physically as well as mentally.  Device approaching  ERI.  Given his young age and threshold, would anticipate endovascular device implantation and capping of his epicardial leads

## 2017-10-16 NOTE — Patient Instructions (Signed)

## 2017-10-29 DIAGNOSIS — E291 Testicular hypofunction: Secondary | ICD-10-CM | POA: Diagnosis not present

## 2017-10-31 ENCOUNTER — Ambulatory Visit (INDEPENDENT_AMBULATORY_CARE_PROVIDER_SITE_OTHER): Payer: Medicare HMO

## 2017-10-31 DIAGNOSIS — Z5181 Encounter for therapeutic drug level monitoring: Secondary | ICD-10-CM | POA: Diagnosis not present

## 2017-10-31 DIAGNOSIS — Z952 Presence of prosthetic heart valve: Secondary | ICD-10-CM

## 2017-10-31 LAB — POCT INR: INR: 2.1 (ref 2.0–3.0)

## 2017-10-31 NOTE — Patient Instructions (Signed)
Please continue dosage of 1 tablet daily except 1.5 on Sundays, Tuesdays, Thursdays. Recheck in 4 weeks.

## 2017-11-02 LAB — CUP PACEART INCLINIC DEVICE CHECK
Implantable Lead Implant Date: 20160705
Implantable Lead Implant Date: 20160705
Implantable Lead Location: 753858
Implantable Lead Location: 753859
Implantable Lead Model: 5071
Implantable Lead Model: 5071
Lead Channel Setting Pacing Amplitude: 2.5 V
Lead Channel Setting Pacing Pulse Width: 1 ms
MDC IDC LEAD IMPLANT DT: 20160705
MDC IDC LEAD LOCATION: 753860
MDC IDC PG IMPLANT DT: 20160705
MDC IDC SESS DTM: 20190607154634
MDC IDC SET LEADCHNL RA PACING AMPLITUDE: 1.75 V
MDC IDC SET LEADCHNL RV PACING AMPLITUDE: 4 V
MDC IDC SET LEADCHNL RV PACING PULSEWIDTH: 1 ms
MDC IDC SET LEADCHNL RV SENSING SENSITIVITY: 4 mV

## 2017-11-04 DIAGNOSIS — R69 Illness, unspecified: Secondary | ICD-10-CM | POA: Diagnosis not present

## 2017-11-12 DIAGNOSIS — J449 Chronic obstructive pulmonary disease, unspecified: Secondary | ICD-10-CM | POA: Diagnosis not present

## 2017-11-12 DIAGNOSIS — I1310 Hypertensive heart and chronic kidney disease without heart failure, with stage 1 through stage 4 chronic kidney disease, or unspecified chronic kidney disease: Secondary | ICD-10-CM | POA: Diagnosis not present

## 2017-11-12 DIAGNOSIS — E785 Hyperlipidemia, unspecified: Secondary | ICD-10-CM | POA: Diagnosis not present

## 2017-11-12 DIAGNOSIS — G4733 Obstructive sleep apnea (adult) (pediatric): Secondary | ICD-10-CM | POA: Diagnosis not present

## 2017-11-12 DIAGNOSIS — Z952 Presence of prosthetic heart valve: Secondary | ICD-10-CM | POA: Diagnosis not present

## 2017-11-12 DIAGNOSIS — R0600 Dyspnea, unspecified: Secondary | ICD-10-CM | POA: Diagnosis not present

## 2017-11-12 DIAGNOSIS — R0602 Shortness of breath: Secondary | ICD-10-CM | POA: Diagnosis not present

## 2017-11-12 DIAGNOSIS — E1122 Type 2 diabetes mellitus with diabetic chronic kidney disease: Secondary | ICD-10-CM | POA: Diagnosis not present

## 2017-11-12 DIAGNOSIS — N189 Chronic kidney disease, unspecified: Secondary | ICD-10-CM | POA: Diagnosis not present

## 2017-11-12 DIAGNOSIS — I252 Old myocardial infarction: Secondary | ICD-10-CM | POA: Diagnosis not present

## 2017-11-12 DIAGNOSIS — R05 Cough: Secondary | ICD-10-CM | POA: Diagnosis not present

## 2017-11-12 DIAGNOSIS — J986 Disorders of diaphragm: Secondary | ICD-10-CM | POA: Diagnosis not present

## 2017-11-14 ENCOUNTER — Ambulatory Visit: Payer: Self-pay | Admitting: Family Medicine

## 2017-11-15 DIAGNOSIS — R69 Illness, unspecified: Secondary | ICD-10-CM | POA: Diagnosis not present

## 2017-11-17 DIAGNOSIS — R69 Illness, unspecified: Secondary | ICD-10-CM | POA: Diagnosis not present

## 2017-11-20 ENCOUNTER — Emergency Department: Payer: Medicare HMO

## 2017-11-20 ENCOUNTER — Encounter: Payer: Self-pay | Admitting: Emergency Medicine

## 2017-11-20 ENCOUNTER — Inpatient Hospital Stay
Admission: EM | Admit: 2017-11-20 | Discharge: 2017-11-23 | DRG: 190 | Disposition: A | Discharge status: Home / Self Care | Payer: Medicare HMO | Attending: Internal Medicine | Admitting: Internal Medicine

## 2017-11-20 ENCOUNTER — Other Ambulatory Visit: Payer: Self-pay

## 2017-11-20 DIAGNOSIS — Z950 Presence of cardiac pacemaker: Secondary | ICD-10-CM

## 2017-11-20 DIAGNOSIS — Z952 Presence of prosthetic heart valve: Secondary | ICD-10-CM | POA: Diagnosis not present

## 2017-11-20 DIAGNOSIS — I5042 Chronic combined systolic (congestive) and diastolic (congestive) heart failure: Secondary | ICD-10-CM | POA: Diagnosis not present

## 2017-11-20 DIAGNOSIS — Z7984 Long term (current) use of oral hypoglycemic drugs: Secondary | ICD-10-CM

## 2017-11-20 DIAGNOSIS — G9340 Encephalopathy, unspecified: Secondary | ICD-10-CM | POA: Diagnosis not present

## 2017-11-20 DIAGNOSIS — Z87891 Personal history of nicotine dependence: Secondary | ICD-10-CM

## 2017-11-20 DIAGNOSIS — G4733 Obstructive sleep apnea (adult) (pediatric): Secondary | ICD-10-CM | POA: Diagnosis present

## 2017-11-20 DIAGNOSIS — D72829 Elevated white blood cell count, unspecified: Secondary | ICD-10-CM | POA: Diagnosis present

## 2017-11-20 DIAGNOSIS — I2510 Atherosclerotic heart disease of native coronary artery without angina pectoris: Secondary | ICD-10-CM | POA: Diagnosis present

## 2017-11-20 DIAGNOSIS — J9601 Acute respiratory failure with hypoxia: Secondary | ICD-10-CM | POA: Diagnosis present

## 2017-11-20 DIAGNOSIS — R0602 Shortness of breath: Secondary | ICD-10-CM

## 2017-11-20 DIAGNOSIS — J441 Chronic obstructive pulmonary disease with (acute) exacerbation: Principal | ICD-10-CM | POA: Diagnosis present

## 2017-11-20 DIAGNOSIS — E1165 Type 2 diabetes mellitus with hyperglycemia: Secondary | ICD-10-CM | POA: Diagnosis present

## 2017-11-20 DIAGNOSIS — R5383 Other fatigue: Secondary | ICD-10-CM | POA: Diagnosis present

## 2017-11-20 DIAGNOSIS — Z7901 Long term (current) use of anticoagulants: Secondary | ICD-10-CM

## 2017-11-20 DIAGNOSIS — I110 Hypertensive heart disease with heart failure: Secondary | ICD-10-CM | POA: Diagnosis present

## 2017-11-20 DIAGNOSIS — Z9989 Dependence on other enabling machines and devices: Secondary | ICD-10-CM

## 2017-11-20 DIAGNOSIS — Z7982 Long term (current) use of aspirin: Secondary | ICD-10-CM

## 2017-11-20 DIAGNOSIS — R4182 Altered mental status, unspecified: Secondary | ICD-10-CM | POA: Diagnosis not present

## 2017-11-20 DIAGNOSIS — I428 Other cardiomyopathies: Secondary | ICD-10-CM | POA: Diagnosis present

## 2017-11-20 DIAGNOSIS — J9811 Atelectasis: Secondary | ICD-10-CM | POA: Diagnosis not present

## 2017-11-20 DIAGNOSIS — Z79899 Other long term (current) drug therapy: Secondary | ICD-10-CM

## 2017-11-20 DIAGNOSIS — Z6833 Body mass index (BMI) 33.0-33.9, adult: Secondary | ICD-10-CM

## 2017-11-20 DIAGNOSIS — E876 Hypokalemia: Secondary | ICD-10-CM | POA: Diagnosis present

## 2017-11-20 DIAGNOSIS — E785 Hyperlipidemia, unspecified: Secondary | ICD-10-CM | POA: Diagnosis present

## 2017-11-20 DIAGNOSIS — R410 Disorientation, unspecified: Secondary | ICD-10-CM | POA: Diagnosis not present

## 2017-11-20 DIAGNOSIS — I442 Atrioventricular block, complete: Secondary | ICD-10-CM | POA: Diagnosis not present

## 2017-11-20 DIAGNOSIS — I5043 Acute on chronic combined systolic (congestive) and diastolic (congestive) heart failure: Secondary | ICD-10-CM | POA: Diagnosis present

## 2017-11-20 LAB — CBC WITH DIFFERENTIAL/PLATELET
BASOS ABS: 0.1 10*3/uL (ref 0–0.1)
Basophils Relative: 1 %
EOS PCT: 1 %
Eosinophils Absolute: 0.1 10*3/uL (ref 0–0.7)
HEMATOCRIT: 42.4 % (ref 40.0–52.0)
Hemoglobin: 14.3 g/dL (ref 13.0–18.0)
LYMPHS ABS: 2 10*3/uL (ref 1.0–3.6)
LYMPHS PCT: 19 %
MCH: 30.5 pg (ref 26.0–34.0)
MCHC: 33.8 g/dL (ref 32.0–36.0)
MCV: 90.4 fL (ref 80.0–100.0)
MONO ABS: 0.8 10*3/uL (ref 0.2–1.0)
Monocytes Relative: 8 %
NEUTROS ABS: 7.7 10*3/uL — AB (ref 1.4–6.5)
Neutrophils Relative %: 71 %
PLATELETS: 194 10*3/uL (ref 150–440)
RBC: 4.7 MIL/uL (ref 4.40–5.90)
RDW: 13.7 % (ref 11.5–14.5)
WBC: 10.8 10*3/uL — AB (ref 3.8–10.6)

## 2017-11-20 LAB — BASIC METABOLIC PANEL
ANION GAP: 11 (ref 5–15)
BUN: 9 mg/dL (ref 6–20)
CO2: 24 mmol/L (ref 22–32)
Calcium: 8.8 mg/dL — ABNORMAL LOW (ref 8.9–10.3)
Chloride: 103 mmol/L (ref 98–111)
Creatinine, Ser: 0.79 mg/dL (ref 0.61–1.24)
GFR calc Af Amer: >60 mL/min (ref >60)
GFR calc non Af Amer: >60 mL/min (ref >60)
GLUCOSE: 279 mg/dL — AB (ref 70–99)
POTASSIUM: 3.1 mmol/L — AB (ref 3.5–5.1)
Sodium: 138 mmol/L (ref 135–145)

## 2017-11-20 LAB — TROPONIN I: Troponin I: <0.03 ng/mL (ref <0.03)

## 2017-11-20 LAB — BRAIN NATRIURETIC PEPTIDE: B Natriuretic Peptide: 38 pg/mL (ref 0.0–100.0)

## 2017-11-20 NOTE — ED Notes (Addendum)
Pt to the er for swelling to feet, and SOB. Pt has a hx of a third degree heart block, heart attack, valve disease, and pacemaker placement. Denies chest pain. SOB is unchanged wether active or at rest. Pt placed on 2L. Pt has swelling to his feet bilaterally and in his face. Pt appears uncomfortable with his breathing. Sats are 93% on room air. Wife is at bedside. Bounding pulses radial bilaterally and right pedal. Left pedal pulse palpable but not bounding.

## 2017-11-20 NOTE — ED Provider Notes (Signed)
[facility] Emergency Department Provider Note  ____________________________________________   First MD Initiated Contact with Patient 11/20/17 2318     (approximate)  I have reviewed the triage vital signs and the nursing notes.   HISTORY  Chief Complaint Leg Swelling and Weakness   HPI Nicholas Horton is a 42 y.o. male who self presents to the emergency department with roughly 1 week of gradually progressive now moderate severity shortness of breath.  He has a complex past medical history including congestive heart failure secondary to congenital bicuspid aortic valve.  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  He also has COPD.  His wife notes that the patient has been increasingly confused over the past week or so.  His symptoms have been slowly progressive for now moderate severity.  Nothing seems to make them better or worse.  No fevers or chills.  Some dry cough.    Past Medical History:  Diagnosis Date  . CHF (congestive heart failure) (HCC)   . Congenital bicuspid aortic valve 09/2014   - s/p AVR  . Nonischemic cardiomyopathy (HCC) -- Resolved[I42.9] 09/2014   EF by Echo 20-25% (pre-op AVR) --> Echo 10/2014 & 03/2015: EF 50-55% - also Recent Normal Diastolic parameters   . S/P AVR (aortic valve replacement) 10/2014   21 mm Carbometrics Mechanical Valve -- Epicardial PPM Leads  . Severe aortic stenosis 10/04/2014   Presented with Syncope & CHF  . Third degree heart block (HCC) 10/04/2014   Transient CHB related to AS -- s/p PPM    Patient Active Problem List   Diagnosis Date Noted  . Acute hypoxemic respiratory failure (HCC) 11/21/2017  . CHF (congestive heart failure) (HCC) 09/29/2016  . Depression 09/29/2016  . Hyperglycemia 09/29/2016  . Warfarin anticoagulation 09/29/2016  . Dyslipidemia 07/13/2016  . Obesity (BMI 30.0-34.9) 07/07/2016  . Heart  palpitations 07/07/2015  . Chronic diastolic heart failure (HCC) 04/14/2015  . Encounter for therapeutic drug monitoring 12/16/2014  . S/P AVR 12/01/2014  . AS (aortic stenosis)   . Complete heart block (HCC) 11/23/2014  . E. Coli UTI 11/23/2014  . Chest pain 11/21/2014  . Syncope 11/21/2014  . Paradentosis 11/21/2014  . Cardiac pacemaker in situ 11/12/2014  . Nonischemic cardiomyopathy-EF 50-55% echo 11/20/14 10/16/2014  . Tobacco use disorder 10/07/2014  . Acute respiratory failure (HCC) 10/07/2014  . Bicuspid aortic valve   . NSTEMI, initial episode of care (HCC) 10/04/2014  . Third degree heart block (HCC) 10/04/2014  . Hypoxia 10/04/2014    Class: Acute  . Dyspnea on exertion 10/04/2014  . Moderate to severe AS with bicuspid valve. Mean 33, peak 61 mmHg. 10/04/2014    Past Surgical History:  Procedure Laterality Date  . AORTIC VALVE REPLACEMENT N/A 12/01/2014   Procedure: AORTIC VALVE REPLACEMENT (AVR);  Surgeon: Kerin Perna, MD;  Location[facility] OR;  Service: Open Heart Surgery;  Laterality: N/A;  . CARDIAC CATHETERIZATION N/A 10/04/2014   Procedure: Left Heart Cath and Coronary Angiography;  Surgeon: Marykay Lex, MD;  Location: [facility] INVASIVE CV LAB;  Service: Cardiovascular;  Laterality: N/A;  . CARDIAC CATHETERIZATION  10/04/2014   Procedure: Temporary Pacemaker;  Surgeon: Marykay Lex, MD;  Location: [facility] INVASIVE CV LAB;  Service: Cardiovascular;;  . CARDIAC CATHETERIZATION N/A 11/25/2014   Procedure: Temporary Pacemaker;  Surgeon: Marinus Maw, MD;  Location: [facility] INVASIVE CV LAB;  Service: Cardiovascular;  Laterality: N/A;  .  MULTIPLE EXTRACTIONS WITH ALVEOLOPLASTY N/A 11/25/2014   Procedure: Extraction of tooth #'s 1,2,3,4,5,6,7,9, 10, 11, 12,13,14,16, 19, 20, 21, 22, 23, 24, 25, 26, 27, 28, 29, 30, 31 with alveoloplasty;  Surgeon: Charlynne Pander, DDS;  Location: [facility] OR;  Service: Oral Surgery;  Laterality: N/A;  . SURGERY SCROTAL / TESTICULAR     for undescended  testicle as a child  . TEE WITHOUT CARDIOVERSION N/A 12/01/2014   Procedure: TRANSESOPHAGEAL ECHOCARDIOGRAM (TEE);  Surgeon: Kerin Perna, MD;  Location: [facility] OR;  Service: Open Heart Surgery;  Laterality: N/A;  . TRANSTHORACIC ECHOCARDIOGRAM  03/2015   Mildly dilated LV. EF 50 at 55%. Normal diastolic parameters. Mild to moderate stenosis of prosthetic aortic valve. Mild to moderate regurgitation    Prior to Admission medications   Medication Sig Start Date End Date Taking? Authorizing Provider  amoxicillin (AMOXIL) 500 MG capsule Take 4 capsules (2,000 mg total) by mouth as needed. One hour prior to dental procedure 08/07/17  Yes Marykay Lex, MD  aspirin EC 81 MG EC tablet Take 1 tablet (81 mg total) by mouth daily. 10/07/14  Yes Emokpae, Ejiroghene E, MD  atorvastatin (LIPITOR) 20 MG tablet Take 0.5 tablets (10 mg total) by mouth daily. Patient taking differently: Take 20 mg by mouth daily.  02/01/17  Yes Marykay Lex, MD  BuPROPion HCl (WELLBUTRIN PO) Take 200 mg by mouth 2 (two) times daily.    Yes [provider]  furosemide (LASIX) 40 MG tablet Take one tablet by mouth daily and may take an extra one dose additional if needed 07/07/16  Yes Marykay Lex, MD  Glycopyrrolate-Formoterol (BEVESPI AEROSPHERE IN) Inhale 2 puffs into the lungs 2 (two) times daily.   Yes [provider]  losartan (COZAAR) 25 MG tablet Take 1 tablet (25 mg total) by mouth daily. 07/07/16 11/21/17 Yes Marykay Lex, MD  metFORMIN (GLUCOPHAGE) 500 MG tablet Take 500 mg by mouth 2 (two) times daily. 09/30/16 11/21/17 Yes [provider]  OLANZapine (ZYPREXA) 2.5 MG tablet Take 0.5 tablets (1.25 mg total) by mouth at bedtime. Patient taking differently: Take 20 mg by mouth at bedtime.  08/17/16 11/21/17 Yes Willy Eddy, MD  warfarin (COUMADIN) 5 MG tablet Take 7.5MG  by mouth Sunday, Tuesday and Thursday evening Take 5MG  by mouth Monday, Wednesday, Friday and Saturday evening   Yes  [provider]    Allergies Patient has no known allergies.  Family History  Problem Relation Age of Onset  . CAD Neg Hx   . Hypertension Neg Hx     Social History Social History   Tobacco Use  . Smoking status: Former Smoker    Packs/day: 2.00    Years: 20.00    Pack years: 40.00    Types: Cigarettes  . Smokeless tobacco: Never Used  . Tobacco comment: quit 10/04/14  Substance Use Topics  . Alcohol use: No    Alcohol/week: 0.0 oz    Comment: rarely  . Drug use: No    Review of Systems Constitutional: No fever/chills Eyes: No visual changes. ENT: No sore throat. Cardiovascular: Positive for chest pain. Respiratory: Positive for shortness of breath. Gastrointestinal: No abdominal pain.  No nausea, no vomiting.  No diarrhea.  No constipation. Genitourinary: Negative for dysuria. Musculoskeletal: Negative for back pain. Skin: Negative for rash. Neurological: Negative for headaches, focal weakness or numbness.   ____________________________________________   PHYSICAL EXAM:  VITAL SIGNS: ED Triage Vitals  Enc Vitals Group     BP 11/20/17  2233 135/84     Pulse Rate 11/20/17 2233 96     Resp 11/20/17 2233 18     Temp 11/20/17 2233 99 F (37.2 C)     Temp Source 11/20/17 2233 Oral     SpO2 11/20/17 2233 93 %     Weight 11/20/17 2233 260 lb (117.9 kg)     Height 11/20/17 2233 6\' 2"  (1.88 m)     Head Circumference --      Peak Flow --      Pain Score 11/20/17 2232 0     Pain Loc --      Pain Edu? --      Excl. in GC? --     Constitutional: Pleasant cooperative appears somewhat uncomfortable with elevated respiratory rate Eyes: PERRL EOMI. Head: Atraumatic. Nose: No congestion/rhinnorhea. Mouth/Throat: No trismus Neck: No stridor.   Cardiovascular: Normal rate, regular rhythm.  3 out of 6 systolic murmur best heard at the right border Respiratory: Increased respiratory effort with crackles throughout Gastrointestinal: Soft  nontender Musculoskeletal: No lower extremity edema   Neurologic:  Normal speech and language. No gross focal neurologic deficits are appreciated. Skin:  Skin is warm, dry and intact. No rash noted. Psychiatric: Mood and affect are normal. Speech and behavior are normal.    ____________________________________________   DIFFERENTIAL includes but not limited to  COPD exacerbation, pneumonia, pneumothorax, pulmonary embolism, pulmonary edema ____________________________________________   LABS (all labs ordered are listed, but only abnormal results are displayed)  Labs Reviewed  CBC WITH DIFFERENTIAL/PLATELET - Abnormal; Notable for the following components:      Result Value   WBC 10.8 (*)    Neutro Abs 7.7 (*)    All other components within normal limits  BASIC METABOLIC PANEL - Abnormal; Notable for the following components:   Potassium 3.1 (*)    Glucose, Bld 279 (*)    Calcium 8.8 (*)    All other components within normal limits  HEPATIC FUNCTION PANEL - Abnormal; Notable for the following components:   AST 42 (*)    All other components within normal limits  PROTIME-INR - Abnormal; Notable for the following components:   Prothrombin Time 23.6 (*)    All other components within normal limits  URINE DRUG SCREEN, QUALITATIVE ([facility] ONLY) - Abnormal; Notable for the following components:   Barbiturates, Ur Screen   (*)    Value: Result not available. Reagent lot number recalled by manufacturer.   All other components within normal limits  URINALYSIS, COMPLETE (UACMP) WITH MICROSCOPIC - Abnormal; Notable for the following components:   Color, Urine YELLOW (*)    APPearance CLEAR (*)    Glucose, UA >=500 (*)    Ketones, ur 5 (*)    Protein, ur 30 (*)    All other components within normal limits  GLUCOSE, CAPILLARY - Abnormal; Notable for the following components:   Glucose-Capillary 293 (*)    All other components within normal limits  GLUCOSE, CAPILLARY - Abnormal;  Notable for the following components:   Glucose-Capillary 327 (*)    All other components within normal limits  GLUCOSE, CAPILLARY - Abnormal; Notable for the following components:   Glucose-Capillary 397 (*)    All other components within normal limits  PROTIME-INR - Abnormal; Notable for the following components:   Prothrombin Time 28.1 (*)    All other components within normal limits  GLUCOSE, CAPILLARY - Abnormal; Notable for the following components:   Glucose-Capillary 331 (*)    All other  components within normal limits  GLUCOSE, CAPILLARY - Abnormal; Notable for the following components:   Glucose-Capillary 277 (*)    All other components within normal limits  GLUCOSE, CAPILLARY - Abnormal; Notable for the following components:   Glucose-Capillary 176 (*)    All other components within normal limits  HEMOGLOBIN A1C - Abnormal; Notable for the following components:   Hgb A1c MFr Bld 6.8 (*)    All other components within normal limits  BLOOD GAS, ARTERIAL - Abnormal; Notable for the following components:   pO2, Arterial 63 (*)    Acid-Base Excess 2.9 (*)    All other components within normal limits  GLUCOSE, CAPILLARY - Abnormal; Notable for the following components:   Glucose-Capillary 340 (*)    All other components within normal limits  GLUCOSE, CAPILLARY - Abnormal; Notable for the following components:   Glucose-Capillary 303 (*)    All other components within normal limits  CBC - Abnormal; Notable for the following components:   WBC 12.5 (*)    RBC 4.28 (*)    HCT 39.2 (*)    All other components within normal limits  PROTIME-INR - Abnormal; Notable for the following components:   Prothrombin Time 31.3 (*)    All other components within normal limits  BASIC METABOLIC PANEL - Abnormal; Notable for the following components:   Glucose, Bld 154 (*)    Calcium 8.6 (*)    All other components within normal limits  GLUCOSE, CAPILLARY - Abnormal; Notable for the  following components:   Glucose-Capillary 239 (*)    All other components within normal limits  GLUCOSE, CAPILLARY - Abnormal; Notable for the following components:   Glucose-Capillary 140 (*)    All other components within normal limits  RESPIRATORY PANEL BY PCR  BRAIN NATRIURETIC PEPTIDE  TROPONIN I  AMMONIA  ETHANOL  TROPONIN I  TSH  RPR  RAPID HIV SCREEN (HIV 1/2 AB+AG)  MAGNESIUM  MAGNESIUM    Lab work reviewed by me shows INR is therapeutic.  Otherwise largely unremarkable __________________________________________  EKG  ED ECG REPORT I, Merrily Brittle, the attending physician, personally viewed and interpreted this ECG.  Date: 11/20/2017 EKG Time:  Rate: 99 Rhythm: Ventricular paced rhythm QRS Axis: normal Intervals: normal ST/T Wave abnormalities: normal Narrative Interpretation: no evidence of acute ischemia  ____________________________________________  RADIOLOGY  Chest x-ray reviewed by me with no acute disease Head CT reviewed by me with no acute disease ____________________________________________   PROCEDURES  Procedure(s) performed: no  Procedures  Critical Care performed: no  ____________________________________________   INITIAL IMPRESSION / ASSESSMENT AND PLAN / ED COURSE  Pertinent labs & imaging results that were available during my care of the patient were reviewed by me and considered in my medical decision making (see chart for details).   The patient arrives mildly confused according to his wife although seems relatively normal to me.  No asterixis.  Lungs are somewhat crackly and he has plenty of reasons to be short of breath.  Broad work-up is pending.      _____----------------------------------------- 12:36 AM on 11/21/2017 -----------------------------------------  Lab work is largely unremarkable yet the patient remains clearly short of breath and hypoxic on room air.  Favor COPD exacerbation at this point so we will  treat him with multiple duo nebs, Solu-Medrol, azithromycin, and he will require inpatient admission for continued respiratory support.  _______________________________________   FINAL CLINICAL IMPRESSION(S) / ED DIAGNOSES  Final diagnoses:  Shortness of breath  NEW MEDICATIONS STARTED DURING THIS VISIT:  Current Discharge Medication List       Note:  This document was prepared using Dragon voice recognition software and may include unintentional dictation errors.     Merrily Brittle, MD 11/23/17 [phone]

## 2017-11-20 NOTE — ED Triage Notes (Addendum)
Patient ambulatory to triage with steady gait, without difficulty or distress noted; pt reports x 5 days having swelling to feet/abd/neck; st hx of same and rx lasix but no known dx; ejection fract 50%, pacemaker in place; c/o weakness

## 2017-11-20 NOTE — ED Notes (Signed)
ED Provider at bedside. 

## 2017-11-20 NOTE — ED Notes (Signed)
Patient transported to CT 

## 2017-11-21 ENCOUNTER — Encounter: Payer: Self-pay | Admitting: Internal Medicine

## 2017-11-21 DIAGNOSIS — D72829 Elevated white blood cell count, unspecified: Secondary | ICD-10-CM | POA: Diagnosis present

## 2017-11-21 DIAGNOSIS — E785 Hyperlipidemia, unspecified: Secondary | ICD-10-CM | POA: Diagnosis present

## 2017-11-21 DIAGNOSIS — Z7982 Long term (current) use of aspirin: Secondary | ICD-10-CM | POA: Diagnosis not present

## 2017-11-21 DIAGNOSIS — I110 Hypertensive heart disease with heart failure: Secondary | ICD-10-CM | POA: Diagnosis not present

## 2017-11-21 DIAGNOSIS — G4733 Obstructive sleep apnea (adult) (pediatric): Secondary | ICD-10-CM | POA: Diagnosis not present

## 2017-11-21 DIAGNOSIS — I5042 Chronic combined systolic (congestive) and diastolic (congestive) heart failure: Secondary | ICD-10-CM | POA: Diagnosis not present

## 2017-11-21 DIAGNOSIS — J9621 Acute and chronic respiratory failure with hypoxia: Secondary | ICD-10-CM | POA: Diagnosis not present

## 2017-11-21 DIAGNOSIS — Z7984 Long term (current) use of oral hypoglycemic drugs: Secondary | ICD-10-CM | POA: Diagnosis not present

## 2017-11-21 DIAGNOSIS — I442 Atrioventricular block, complete: Secondary | ICD-10-CM

## 2017-11-21 DIAGNOSIS — Z6833 Body mass index (BMI) 33.0-33.9, adult: Secondary | ICD-10-CM | POA: Diagnosis not present

## 2017-11-21 DIAGNOSIS — Z950 Presence of cardiac pacemaker: Secondary | ICD-10-CM | POA: Diagnosis not present

## 2017-11-21 DIAGNOSIS — Z87891 Personal history of nicotine dependence: Secondary | ICD-10-CM | POA: Diagnosis not present

## 2017-11-21 DIAGNOSIS — E1165 Type 2 diabetes mellitus with hyperglycemia: Secondary | ICD-10-CM | POA: Diagnosis not present

## 2017-11-21 DIAGNOSIS — J9601 Acute respiratory failure with hypoxia: Secondary | ICD-10-CM | POA: Diagnosis not present

## 2017-11-21 DIAGNOSIS — Z9989 Dependence on other enabling machines and devices: Secondary | ICD-10-CM | POA: Diagnosis not present

## 2017-11-21 DIAGNOSIS — J441 Chronic obstructive pulmonary disease with (acute) exacerbation: Secondary | ICD-10-CM | POA: Diagnosis not present

## 2017-11-21 DIAGNOSIS — I5043 Acute on chronic combined systolic (congestive) and diastolic (congestive) heart failure: Secondary | ICD-10-CM | POA: Diagnosis not present

## 2017-11-21 DIAGNOSIS — R410 Disorientation, unspecified: Secondary | ICD-10-CM | POA: Diagnosis not present

## 2017-11-21 DIAGNOSIS — Z952 Presence of prosthetic heart valve: Secondary | ICD-10-CM | POA: Diagnosis not present

## 2017-11-21 DIAGNOSIS — Z7901 Long term (current) use of anticoagulants: Secondary | ICD-10-CM | POA: Diagnosis not present

## 2017-11-21 DIAGNOSIS — R5383 Other fatigue: Secondary | ICD-10-CM | POA: Diagnosis present

## 2017-11-21 DIAGNOSIS — R69 Illness, unspecified: Secondary | ICD-10-CM | POA: Diagnosis not present

## 2017-11-21 DIAGNOSIS — E876 Hypokalemia: Secondary | ICD-10-CM | POA: Diagnosis not present

## 2017-11-21 DIAGNOSIS — I2510 Atherosclerotic heart disease of native coronary artery without angina pectoris: Secondary | ICD-10-CM | POA: Diagnosis present

## 2017-11-21 DIAGNOSIS — G9340 Encephalopathy, unspecified: Secondary | ICD-10-CM | POA: Diagnosis not present

## 2017-11-21 DIAGNOSIS — I428 Other cardiomyopathies: Secondary | ICD-10-CM | POA: Diagnosis not present

## 2017-11-21 DIAGNOSIS — Z79899 Other long term (current) drug therapy: Secondary | ICD-10-CM | POA: Diagnosis not present

## 2017-11-21 LAB — URINALYSIS, COMPLETE (UACMP) WITH MICROSCOPIC
BILIRUBIN URINE: NEGATIVE
Bacteria, UA: NONE SEEN
HGB URINE DIPSTICK: NEGATIVE
Ketones, ur: 5 mg/dL — AB
LEUKOCYTES UA: NEGATIVE
NITRITE: NEGATIVE
Protein, ur: 30 mg/dL — AB
Specific Gravity, Urine: 1.02 (ref 1.005–1.030)
Squamous Epithelial / LPF: NONE SEEN (ref 0–5)
pH: 6 (ref 5.0–8.0)

## 2017-11-21 LAB — HEPATIC FUNCTION PANEL
ALBUMIN: 3.7 g/dL (ref 3.5–5.0)
ALK PHOS: 60 U/L (ref 38–126)
ALT: 42 U/L (ref 0–44)
AST: 42 U/L — AB (ref 15–41)
BILIRUBIN TOTAL: 0.8 mg/dL (ref 0.3–1.2)
Bilirubin, Direct: <0.1 mg/dL (ref 0.0–0.2)
TOTAL PROTEIN: 6.5 g/dL (ref 6.5–8.1)

## 2017-11-21 LAB — RAPID HIV SCREEN (HIV 1/2 AB+AG)
HIV 1/2 Antibodies: NONREACTIVE
HIV-1 P24 Antigen - HIV24: NONREACTIVE

## 2017-11-21 LAB — RESPIRATORY PANEL BY PCR
ADENOVIRUS-RVPPCR: NOT DETECTED
Bordetella pertussis: NOT DETECTED
CHLAMYDOPHILA PNEUMONIAE-RVPPCR: NOT DETECTED
CORONAVIRUS NL63-RVPPCR: NOT DETECTED
CORONAVIRUS OC43-RVPPCR: NOT DETECTED
Coronavirus 229E: NOT DETECTED
Coronavirus HKU1: NOT DETECTED
INFLUENZA B-RVPPCR: NOT DETECTED
Influenza A: NOT DETECTED
Metapneumovirus: NOT DETECTED
Mycoplasma pneumoniae: NOT DETECTED
PARAINFLUENZA VIRUS 3-RVPPCR: NOT DETECTED
Parainfluenza Virus 1: NOT DETECTED
Parainfluenza Virus 2: NOT DETECTED
Parainfluenza Virus 4: NOT DETECTED
RESPIRATORY SYNCYTIAL VIRUS-RVPPCR: NOT DETECTED
RHINOVIRUS / ENTEROVIRUS - RVPPCR: NOT DETECTED

## 2017-11-21 LAB — GLUCOSE, CAPILLARY
GLUCOSE-CAPILLARY: 397 mg/dL — AB (ref 70–99)
Glucose-Capillary: 293 mg/dL — ABNORMAL HIGH (ref 70–99)
Glucose-Capillary: 327 mg/dL — ABNORMAL HIGH (ref 70–99)
Glucose-Capillary: 331 mg/dL — ABNORMAL HIGH (ref 70–99)
Glucose-Capillary: 277 mg/dL — ABNORMAL HIGH (ref 70–99)

## 2017-11-21 LAB — URINE DRUG SCREEN, QUALITATIVE (ARMC ONLY)
Amphetamines, Ur Screen: NOT DETECTED
Benzodiazepine, Ur Scrn: NOT DETECTED
CANNABINOID 50 NG, UR ~~LOC~~: NOT DETECTED
COCAINE METABOLITE, UR ~~LOC~~: NOT DETECTED
MDMA (Ecstasy)Ur Screen: NOT DETECTED
METHADONE SCREEN, URINE: NOT DETECTED
Opiate, Ur Screen: NOT DETECTED
Phencyclidine (PCP) Ur S: NOT DETECTED
TRICYCLIC, UR SCREEN: NOT DETECTED

## 2017-11-21 LAB — TROPONIN I: Troponin I: <0.03 ng/mL (ref <0.03)

## 2017-11-21 LAB — ETHANOL

## 2017-11-21 LAB — AMMONIA: Ammonia: 34 umol/L (ref 9–35)

## 2017-11-21 LAB — TSH: TSH: 1.027 u[IU]/mL (ref 0.350–4.500)

## 2017-11-21 LAB — PROTIME-INR
INR: 2.12
PROTHROMBIN TIME: 23.6 s — AB (ref 11.4–15.2)

## 2017-11-21 LAB — MAGNESIUM: MAGNESIUM: 2 mg/dL (ref 1.7–2.4)

## 2017-11-21 MED ORDER — FUROSEMIDE 40 MG PO TABS
40.0000 mg | ORAL_TABLET | Freq: Every day | ORAL | Status: DC
  Administered 2017-11-21 – 2017-11-23 (×3): 40 mg via ORAL
  Filled 2017-11-21 (×3): qty 1

## 2017-11-21 MED ORDER — METHYLPREDNISOLONE SODIUM SUCC 125 MG IJ SOLR
125.0000 mg | Freq: Once | INTRAMUSCULAR | Status: AC
  Administered 2017-11-21: 125 mg via INTRAVENOUS
  Filled 2017-11-21: qty 2

## 2017-11-21 MED ORDER — IPRATROPIUM-ALBUTEROL 0.5-2.5 (3) MG/3ML IN SOLN
3.0000 mL | Freq: Once | RESPIRATORY_TRACT | Status: AC
  Administered 2017-11-21: 3 mL via RESPIRATORY_TRACT
  Filled 2017-11-21: qty 3

## 2017-11-21 MED ORDER — OLANZAPINE 10 MG PO TABS
20.0000 mg | ORAL_TABLET | Freq: Every day | ORAL | Status: DC
  Administered 2017-11-21 – 2017-11-22 (×2): 20 mg via ORAL
  Filled 2017-11-21 (×3): qty 2

## 2017-11-21 MED ORDER — POTASSIUM CHLORIDE CRYS ER 20 MEQ PO TBCR
40.0000 meq | EXTENDED_RELEASE_TABLET | Freq: Two times a day (BID) | ORAL | Status: AC
  Administered 2017-11-21 (×2): 40 meq via ORAL
  Filled 2017-11-21 (×2): qty 2

## 2017-11-21 MED ORDER — INSULIN ASPART 100 UNIT/ML ~~LOC~~ SOLN
0.0000 [IU] | Freq: Every day | SUBCUTANEOUS | Status: DC
  Administered 2017-11-21: 3 [IU] via SUBCUTANEOUS
  Administered 2017-11-22: 2 [IU] via SUBCUTANEOUS
  Filled 2017-11-21 (×2): qty 1

## 2017-11-21 MED ORDER — BUPROPION HCL 100 MG PO TABS
200.0000 mg | ORAL_TABLET | Freq: Two times a day (BID) | ORAL | Status: DC
  Administered 2017-11-21 – 2017-11-23 (×5): 200 mg via ORAL
  Filled 2017-11-21 (×7): qty 2

## 2017-11-21 MED ORDER — ACETAMINOPHEN 325 MG PO TABS: 650.0000 mg | ORAL_TABLET | Freq: Once | ORAL | Status: DC

## 2017-11-21 MED ORDER — INSULIN DETEMIR 100 UNIT/ML ~~LOC~~ SOLN
10.0000 [IU] | SUBCUTANEOUS | Status: AC
  Administered 2017-11-21: 10 [IU] via SUBCUTANEOUS
  Filled 2017-11-21 (×2): qty 0.1

## 2017-11-21 MED ORDER — ATORVASTATIN CALCIUM 20 MG PO TABS
10.0000 mg | ORAL_TABLET | Freq: Every day | ORAL | Status: DC
  Administered 2017-11-21 – 2017-11-22 (×2): 10 mg via ORAL
  Filled 2017-11-21 (×2): qty 1

## 2017-11-21 MED ORDER — BUPROPION HCL 100 MG PO TABS
200.0000 mg | ORAL_TABLET | Freq: Once | ORAL | Status: AC
  Administered 2017-11-21: 200 mg via ORAL
  Filled 2017-11-21: qty 2

## 2017-11-21 MED ORDER — WARFARIN SODIUM 5 MG PO TABS
5.0000 mg | ORAL_TABLET | ORAL | Status: DC
  Administered 2017-11-21 – 2017-11-22 (×2): 5 mg via ORAL
  Filled 2017-11-21 (×2): qty 1

## 2017-11-21 MED ORDER — WARFARIN SODIUM 7.5 MG PO TABS
7.5000 mg | ORAL_TABLET | ORAL | Status: DC
  Administered 2017-11-21: 7.5 mg via ORAL
  Filled 2017-11-21 (×2): qty 1

## 2017-11-21 MED ORDER — ACETAMINOPHEN 650 MG RE SUPP
650.0000 mg | Freq: Four times a day (QID) | RECTAL | Status: DC | PRN
Start: 2017-11-21 — End: 2017-11-23

## 2017-11-21 MED ORDER — OLANZAPINE 10 MG PO TABS
10.0000 mg | ORAL_TABLET | Freq: Once | ORAL | Status: AC
  Administered 2017-11-21: 10 mg via ORAL
  Filled 2017-11-21: qty 1

## 2017-11-21 MED ORDER — IPRATROPIUM-ALBUTEROL 0.5-2.5 (3) MG/3ML IN SOLN
3.0000 mL | Freq: Four times a day (QID) | RESPIRATORY_TRACT | Status: DC
  Administered 2017-11-21 – 2017-11-23 (×6): 3 mL via RESPIRATORY_TRACT
  Filled 2017-11-21 (×6): qty 3

## 2017-11-21 MED ORDER — DICYCLOMINE HCL 10 MG PO CAPS: 10.0000 mg | ORAL_CAPSULE | Freq: Every day | ORAL | Status: DC | PRN

## 2017-11-21 MED ORDER — WARFARIN - PHYSICIAN DOSING INPATIENT
Freq: Every day | Status: DC
  Administered 2017-11-21 – 2017-11-22 (×2)

## 2017-11-21 MED ORDER — LOSARTAN POTASSIUM 50 MG PO TABS
25.0000 mg | ORAL_TABLET | Freq: Every day | ORAL | Status: DC
  Administered 2017-11-21 – 2017-11-23 (×3): 25 mg via ORAL
  Filled 2017-11-21 (×3): qty 1

## 2017-11-21 MED ORDER — ACETAMINOPHEN 325 MG PO TABS
650.0000 mg | ORAL_TABLET | Freq: Four times a day (QID) | ORAL | Status: DC | PRN
  Administered 2017-11-22: 650 mg via ORAL
  Filled 2017-11-21: qty 2

## 2017-11-21 MED ORDER — IPRATROPIUM-ALBUTEROL 0.5-2.5 (3) MG/3ML IN SOLN
3.0000 mL | RESPIRATORY_TRACT | Status: DC
  Administered 2017-11-21 (×4): 3 mL via RESPIRATORY_TRACT
  Filled 2017-11-21 (×4): qty 3

## 2017-11-21 MED ORDER — BISACODYL 5 MG PO TBEC: 5.0000 mg | DELAYED_RELEASE_TABLET | Freq: Every day | ORAL | Status: DC | PRN

## 2017-11-21 MED ORDER — OLANZAPINE 5 MG PO TABS
2.5000 mg | ORAL_TABLET | Freq: Every day | ORAL | Status: DC
  Filled 2017-11-21: qty 0.5

## 2017-11-21 MED ORDER — ONDANSETRON HCL 4 MG PO TABS: 4.0000 mg | ORAL_TABLET | Freq: Four times a day (QID) | ORAL | Status: DC | PRN

## 2017-11-21 MED ORDER — SODIUM CHLORIDE 0.9 % IV SOLN
500.0000 mg | Freq: Once | INTRAVENOUS | Status: AC
  Administered 2017-11-21: 500 mg via INTRAVENOUS
  Filled 2017-11-21: qty 500

## 2017-11-21 MED ORDER — INSULIN ASPART 100 UNIT/ML ~~LOC~~ SOLN
8.0000 [IU] | Freq: Once | SUBCUTANEOUS | Status: AC
  Administered 2017-11-21: 8 [IU] via SUBCUTANEOUS
  Filled 2017-11-21: qty 1

## 2017-11-21 MED ORDER — OLANZAPINE 5 MG PO TABS
5.0000 mg | ORAL_TABLET | Freq: Once | ORAL | Status: AC
  Administered 2017-11-21: 5 mg via ORAL
  Filled 2017-11-21: qty 1

## 2017-11-21 MED ORDER — ASPIRIN EC 81 MG PO TBEC
81.0000 mg | DELAYED_RELEASE_TABLET | Freq: Every day | ORAL | Status: DC
  Administered 2017-11-21 – 2017-11-23 (×3): 81 mg via ORAL
  Filled 2017-11-21 (×3): qty 1

## 2017-11-21 MED ORDER — ONDANSETRON HCL 4 MG/2ML IJ SOLN: 4.0000 mg | Freq: Four times a day (QID) | INTRAMUSCULAR | Status: DC | PRN

## 2017-11-21 MED ORDER — INSULIN ASPART 100 UNIT/ML ~~LOC~~ SOLN
0.0000 [IU] | Freq: Three times a day (TID) | SUBCUTANEOUS | Status: DC
  Administered 2017-11-21: 11 [IU] via SUBCUTANEOUS
  Administered 2017-11-21: 15 [IU] via SUBCUTANEOUS
  Administered 2017-11-22 (×2): 11 [IU] via SUBCUTANEOUS
  Administered 2017-11-22: 3 [IU] via SUBCUTANEOUS
  Administered 2017-11-23: 8 [IU] via SUBCUTANEOUS
  Administered 2017-11-23: 2 [IU] via SUBCUTANEOUS
  Filled 2017-11-21 (×7): qty 1

## 2017-11-21 MED ORDER — INSULIN ASPART 100 UNIT/ML ~~LOC~~ SOLN
0.0000 [IU] | Freq: Three times a day (TID) | SUBCUTANEOUS | Status: DC
  Administered 2017-11-21: 11 [IU] via SUBCUTANEOUS
  Filled 2017-11-21: qty 1

## 2017-11-21 MED ORDER — SENNOSIDES-DOCUSATE SODIUM 8.6-50 MG PO TABS: 1.0000 | ORAL_TABLET | Freq: Every evening | ORAL | Status: DC | PRN

## 2017-11-21 MED ORDER — PREDNISONE 20 MG PO TABS
40.0000 mg | ORAL_TABLET | Freq: Every day | ORAL | Status: DC
  Administered 2017-11-21 – 2017-11-23 (×3): 40 mg via ORAL
  Filled 2017-11-21: qty 4
  Filled 2017-11-21 (×2): qty 2

## 2017-11-21 NOTE — H&P (Addendum)
[facility] [place] [facility]   PATIENT NAME: Nicholas Horton    MR#:  [id number]  DATE OF BIRTH:  16-Sep-1975  DATE OF ADMISSION:  11/20/2017  PRIMARY CARE PHYSICIAN: Etheleen Nicks, NP   REQUESTING/REFERRING PHYSICIAN: Merrily Brittle, MD  CHIEF COMPLAINT:   Chief Complaint  Patient presents with  . Leg Swelling  . Weakness    HISTORY OF PRESENT ILLNESS:  Ardean Melroy  is a 42 y.o. male with a known history of chronic combined systolic + diastolic CHF (EF 95-62% w/ grade I diastolic dysfxn as of 08/28/2017 Echo), CHB (s/p MedTronic PPM), AS (s/p CarboMedics mechanical AVR, on Coumadin), COPD (2L O2 qHS), OSA (CPAP qHS, EPAP unknown) p/w SOB. Pt endorses conversational dyspnea, and allows his fiance (at bedside) to provide Hx. She states that for ~1wk, pt has been lethargic, forgetful and inattentive. She states he has had decreased appetite and dark urine. She states he has had abdominal distension, leg swelling, and swelling of the neck, face and torso. She is very anxious. The pt himself tells me that he has had worsening SOB x2d, with ongoing wheezing (on & off), as well as orthopnea and PND. He is AAOx3. His lungs are clear at the time of my assessment, but he is tachypneic and in mild respiratory distress nonetheless. He does not exhibit tripoding, retractions or accessory muscle use. He is maintaining a stable oxygen saturation on nasal cannula. BNP is 38. Troponin is (-) x1. CXR demonstrates no active pulmonary disease. Pt is not clinically volume overloaded.  PAST MEDICAL HISTORY:   Past Medical History:  Diagnosis Date  . CHF (congestive heart failure) (HCC)   . Congenital bicuspid aortic valve 09/2014   - s/p AVR  . Nonischemic cardiomyopathy (HCC) -- Resolved[I42.9] 09/2014   EF by Echo 20-25% (pre-op AVR) --> Echo 10/2014 & 03/2015: EF 50-55% - also Recent Normal Diastolic parameters   . S/P AVR (aortic valve replacement) 10/2014   21 mm  Carbometrics Mechanical Valve -- Epicardial PPM Leads  . Severe aortic stenosis 10/04/2014   Presented with Syncope & CHF  . Third degree heart block (HCC) 10/04/2014   Transient CHB related to AS -- s/p PPM    PAST SURGICAL HISTORY:   Past Surgical History:  Procedure Laterality Date  . AORTIC VALVE REPLACEMENT N/A 12/01/2014   Procedure: AORTIC VALVE REPLACEMENT (AVR);  Surgeon: Kerin Perna, MD;  Location: [facility] OR;  Service: Open Heart Surgery;  Laterality: N/A;  . CARDIAC CATHETERIZATION N/A 10/04/2014   Procedure: Left Heart Cath and Coronary Angiography;  Surgeon: Marykay Lex, MD;  Location: [facility] INVASIVE CV LAB;  Service: Cardiovascular;  Laterality: N/A;  . CARDIAC CATHETERIZATION  10/04/2014   Procedure: Temporary Pacemaker;  Surgeon: Marykay Lex, MD;  Location: [facility] INVASIVE CV LAB;  Service: Cardiovascular;;  . CARDIAC CATHETERIZATION N/A 11/25/2014   Procedure: Temporary Pacemaker;  Surgeon: Marinus Maw, MD;  Location: [facility] INVASIVE CV LAB;  Service: Cardiovascular;  Laterality: N/A;  . MULTIPLE EXTRACTIONS WITH ALVEOLOPLASTY N/A 11/25/2014   Procedure: Extraction of tooth #'s 1,2,3,4,5,6,7,9, 10, 11, 12,13,14,16, 19, 20, 21, 22, 23, 24, 25, 26, 27, 28, 29, 30, 31 with alveoloplasty;  Surgeon: Charlynne Pander, DDS;  Location: [facility] OR;  Service: Oral Surgery;  Laterality: N/A;  . SURGERY SCROTAL / TESTICULAR     for undescended testicle as a child  . TEE WITHOUT CARDIOVERSION N/A 12/01/2014   Procedure: TRANSESOPHAGEAL ECHOCARDIOGRAM (TEE);  Surgeon: [name],  MD;  Location: [facility] OR;  Service: Open Heart Surgery;  Laterality: N/A;  . TRANSTHORACIC ECHOCARDIOGRAM  03/2015   Mildly dilated LV. EF 50 at 55%. Normal diastolic parameters. Mild to moderate stenosis of prosthetic aortic valve. Mild to moderate regurgitation    SOCIAL HISTORY:   Social History   Tobacco Use  . Smoking status: Former Smoker    Packs/day: 2.00    Years: 20.00    Pack years: 40.00    Types:  Cigarettes  . Smokeless tobacco: Never Used  . Tobacco comment: quit 10/04/14  Substance Use Topics  . Alcohol use: No    Alcohol/week: 0.0 oz    Comment: rarely    FAMILY HISTORY:   Family History  Problem Relation Age of Onset  . CAD Neg Hx   . Hypertension Neg Hx     DRUG ALLERGIES:  No Known Allergies  REVIEW OF SYSTEMS:   Review of Systems  Constitutional: Positive for malaise/fatigue. Negative for chills, diaphoresis, fever and weight loss.  HENT: Negative for congestion, ear pain, hearing loss, nosebleeds, sinus pain, sore throat and tinnitus.   Eyes: Negative for blurred vision, double vision and photophobia.  Respiratory: Positive for shortness of breath and wheezing. Negative for cough, hemoptysis and sputum production.   Cardiovascular: Positive for orthopnea, leg swelling and PND. Negative for chest pain, palpitations and claudication.  Gastrointestinal: Negative for abdominal pain, blood in stool, constipation, diarrhea, heartburn, melena, nausea and vomiting.  Genitourinary: Negative for dysuria, frequency, hematuria and urgency.  Musculoskeletal: Positive for back pain (musculoskeletal). Negative for joint pain, myalgias and neck pain.  Skin: Negative for itching and rash.  Neurological: Positive for weakness. Negative for dizziness, tingling, tremors, sensory change, speech change, focal weakness, seizures, loss of consciousness and headaches.  Psychiatric/Behavioral: Negative for memory loss. The patient does not have insomnia.    MEDICATIONS AT HOME:   Prior to Admission medications   Medication Sig Start Date End Date Taking? Authorizing Provider  amoxicillin (AMOXIL) 500 MG capsule Take 4 capsules (2,000 mg total) by mouth as needed. One hour prior to dental procedure 08/07/17  Yes Marykay Lex, MD  aspirin EC 81 MG EC tablet Take 1 tablet (81 mg total) by mouth daily. 10/07/14  Yes Emokpae, Ejiroghene E, MD  atorvastatin (LIPITOR) 20 MG tablet Take 0.5  tablets (10 mg total) by mouth daily. 02/01/17  Yes Marykay Lex, MD  dicyclomine (BENTYL) 10 MG capsule Take by mouth daily as needed.  07/11/17 07/11/18 Yes [provider]  furosemide (LASIX) 40 MG tablet Take one tablet by mouth daily and may take an extra one dose additional if needed 07/07/16  Yes Marykay Lex, MD  losartan (COZAAR) 25 MG tablet Take 1 tablet (25 mg total) by mouth daily. 07/07/16 11/20/17 Yes Marykay Lex, MD  metFORMIN (GLUCOPHAGE) 500 MG tablet Take 500 mg by mouth 2 (two) times daily. 09/30/16 11/20/17 Yes [provider]  OLANZapine (ZYPREXA) 2.5 MG tablet Take 0.5 tablets (1.25 mg total) by mouth at bedtime. Patient taking differently: Take 2.5 mg by mouth at bedtime.  08/17/16 11/20/17 Yes Willy Eddy, MD  ondansetron (ZOFRAN ODT) 8 MG disintegrating tablet Take 1 tablet (8 mg total) by mouth every 8 (eight) hours as needed for nausea or vomiting. 06/14/15  Yes Sharman Cheek, MD  warfarin (COUMADIN) 5 MG tablet TAKE AS DIRECTED BY COUMADIN CLINIC Patient taking differently: 4 (four) times a week. TAKE AS DIRECTED BY COUMADIN CLINIC 08/02/16  Yes Sherryl Manges  C, MD  warfarin (COUMADIN) 7.5 MG tablet Take 7.5 mg by mouth 3 (three) times a week.   Yes [provider]  BuPROPion HCl (WELLBUTRIN PO) Take 200 mg by mouth 2 (two) times daily.     [provider]  umeclidinium-vilanterol (ANORO ELLIPTA) 62.5-25 MCG/INH AEPB Inhale into the lungs. 09/10/17 09/10/18  [provider]      VITAL SIGNS:  Blood pressure 128/86, pulse 100, temperature 99 F (37.2 C), temperature source Oral, resp. rate 19, height 6\' 2"  (1.88 m), weight 117.9 kg (260 lb), SpO2 91 %.  PHYSICAL EXAMINATION:  Physical Exam  Constitutional: He is oriented to person, place, and time. He appears well-developed and well-nourished. He is active and cooperative.  Non-toxic appearance. He does not have a sickly appearance. He does not appear ill. He appears  distressed (Mild respiratory distress). He is not intubated. Nasal cannula in place.  HENT:  Head: Normocephalic and atraumatic.  Mouth/Throat: Oropharynx is clear and moist. No oropharyngeal exudate.  Eyes: Conjunctivae, EOM and lids are normal. No scleral icterus.  Neck: Neck supple. No JVD present. No thyromegaly present.  Cardiovascular: Normal rate, regular rhythm, S1 normal, S2 normal and normal heart sounds.  No extrasystoles are present. Exam reveals no gallop, no S3, no S4, no distant heart sounds and no friction rub.  No murmur heard. Mechanical heart valve  Pulmonary/Chest: No accessory muscle usage or stridor. Tachypnea noted. No apnea and no bradypnea. He is not intubated. He is in respiratory distress. He has decreased breath sounds in the right upper field, the right middle field, the right lower field, the left upper field, the left middle field and the left lower field. He has no wheezes. He has no rhonchi. He has no rales.  Abdominal: Soft. Bowel sounds are normal. He exhibits no distension. There is no tenderness. There is no rebound and no guarding.  Musculoskeletal: Normal range of motion. He exhibits no edema or tenderness.  Lymphadenopathy:    He has no cervical adenopathy.  Neurological: He is alert and oriented to person, place, and time. He is not disoriented.  Skin: Skin is warm, dry and intact. No rash noted. He is not diaphoretic. No erythema.  Psychiatric: He has a normal mood and affect. His speech is normal and behavior is normal. Judgment and thought content normal. Cognition and memory are normal.   LABORATORY PANEL:   CBC Recent Labs  Lab 11/20/17 2234  WBC 10.8*  HGB 14.3  HCT 42.4  PLT 194   ------------------------------------------------------------------------------------------------------------------  Chemistries  Recent Labs  Lab 11/20/17 2234 11/20/17 2330  NA 138  --   K 3.1*  --   CL 103  --   CO2 24  --   GLUCOSE 279*  --   BUN 9   --   CREATININE 0.79  --   CALCIUM 8.8*  --   AST  --  42*  ALT  --  42  ALKPHOS  --  60  BILITOT  --  0.8   ------------------------------------------------------------------------------------------------------------------  Cardiac Enzymes Recent Labs  Lab 11/20/17 2234  TROPONINI <0.03   ------------------------------------------------------------------------------------------------------------------  RADIOLOGY:  Dg Chest 2 View  Result Date: 11/20/2017 CLINICAL DATA:  Swelling of the feet, abdomen and neck x5 days. EXAM: CHEST - 2 VIEW COMPARISON:  None. FINDINGS: Chronic eventration of the left hemidiaphragm with bibasilar left greater than right atelectasis. Borderline cardiomegaly with mild aortic atherosclerosis. Median sternotomy sutures are in place. Left anterior chest wall pacemaking apparatus is  noted with leads projecting over the right heart. Aortic valvular replacement is noted. IMPRESSION: Chronic basilar atelectasis and/or scarring left greater than right. No active pulmonary disease. Chronic elevation/eventration of the left hemidiaphragm. Electronically Signed   By: Tollie Eth M.D.   On: 11/20/2017 22:52   Ct Head Wo Contrast  Result Date: 11/20/2017 CLINICAL DATA:  42 year old male with altered mental status. EXAM: CT HEAD WITHOUT CONTRAST TECHNIQUE: Contiguous axial images were obtained from the base of the skull through the vertex without intravenous contrast. COMPARISON:  None. FINDINGS: Brain: The ventricles and sulci appropriate in size for patient's age. The gray-white matter discrimination is preserved. Subcentimeter hypodense focus in the region of the right basal ganglia likely represents a dilated prevascular space and less likely an old lacunar infarct. There is no acute intracranial hemorrhage. No mass effect or midline shift. No extra-axial fluid collection. Vascular: No hyperdense vessel or unexpected calcification. Skull: No acute findings. Sinuses/Orbits:  No acute finding. Other: None IMPRESSION: Unremarkable noncontrast CT of the brain. Electronically Signed   By: Elgie Collard M.D.   On: 11/20/2017 23:52   IMPRESSION AND PLAN:   A/P: 84M w/ Hx chronic combined systolic + diastolic CHF (EF 96-04% w/ grade I diastolic dysfxn as of 08/28/2017 Echo), CHB (s/p MedTronic PPM), AS (s/p AVR, on Coumadin), COPD (2L O2 qHS), OSA (CPAP qHS, EPAP unknown) p/w SOB. Admit for acute hypoxemic respiratory failure, acute COPD exacerbation. Also w/ hypokalemia, hyperglycemia/glycosuria/proteinuria (w/ T2NIDDM), mild transaminasemia (AST 42). -Pt p/w SOB//hypoxia, tachypnea, SIRS (+) -No obvious source of infxn -(-) JVD/leg edema, CXR (-) edema/effusions, BNP 38 -Not in acute systolic CHF exacerbation -Rhythm paced, Troponin (-) -On Coumadin, INR 2.12 -Device interrogated, appears to be functioning nominally -Tele, continuous cardiac monitoring -Pulse ox -DuoNebs -Prednisone 40mg  qD x5d -(-) change in sputum quantity or quality/character, (-) BiPAP, ABx not continued beyond initial dose administered in ED -Incentive spirometry -Pulmonary toileting -CPAP qHS for OSA, EPAP unknown -Monitor Trop-I -c/w ASA, ARB, Lasix -Transaminases < 2-3x ULN, c/w Statin -Not on beta blocker (ostensibly 2/2 Hx CHB, though pt has PPM) -I&O -INR 2.1, pt states goal range is 2.0-3.0, c/w Coumadin -SSI, hold PO antihyperglycemics -Replete K+ -Check Mag level -c/w other home meds -Cardiac diabetic diet -Coumadin -Full code -Admission, > 2 midnigths   All the records are reviewed and case discussed with ED provider. Management plans discussed with the patient, family and they are in agreement.  CODE STATUS: Full code  TOTAL TIME TAKING CARE OF THIS PATIENT: 90 minutes.    Barbaraann Rondo M.D on 11/21/2017 at 1:49 AM  Between 7am to 6pm - Pager - [phone]  After 6pm go to [email or web address] - Social research officer, government  [facility] [place] [facility]    Office  [phone]  CC: Primary care physician; Etheleen Nicks, NP   Note: This dictation was prepared with Dragon dictation along with smaller phrase technology. Any transcriptional errors that result from this process are unintentional.

## 2017-11-21 NOTE — Progress Notes (Signed)
Pt states that he takes 20 mg of Zyprexa at HS. MD made aware.

## 2017-11-21 NOTE — ED Notes (Signed)
Pt c/o back pain. Pt says he hurts all over from his shoulders to his buttocks. MD aware.

## 2017-11-21 NOTE — Progress Notes (Signed)
Patient ID: Nicholas Horton, male   DOB: 1975-07-11, 42 y.o.   MRN: [id number]   [facility] PROGRESS NOTE  Nicholas Horton WUJ:[id number] DOB: 1975/08/25 DOA: 11/20/2017 PCP: Etheleen Nicks, NP  HPI/Subjective: Patient states that he has had shortness of breath and confusion.  He states the shortness of breath has been going on for years.  He is feeling a little bit better since when he came in.  Objective: Vitals:   11/21/17 0837 11/21/17 1133  BP:    Pulse:  98  Resp:    Temp: 98.1 F (36.7 C)   SpO2:  95%    Filed Weights   11/20/17 2233 11/21/17 0210  Weight: 117.9 kg (260 lb) 120.7 kg (266 lb)    ROS: Review of Systems  Constitutional: Negative for chills and fever.  Eyes: Negative for blurred vision.  Respiratory: Positive for shortness of breath. Negative for cough.   Cardiovascular: Negative for chest pain.  Gastrointestinal: Negative for abdominal pain, constipation, diarrhea, nausea and vomiting.  Genitourinary: Negative for dysuria.  Musculoskeletal: Negative for joint pain.  Neurological: Negative for dizziness and headaches.   Exam: Physical Exam  Constitutional: He is oriented to person, place, and time.  HENT:  Nose: No mucosal edema.  Mouth/Throat: No oropharyngeal exudate or posterior oropharyngeal edema.  Eyes: Pupils are equal, round, and reactive to light. Conjunctivae, EOM and lids are normal.  Neck: No JVD present. Carotid bruit is not present. No edema present. No thyroid mass and no thyromegaly present.  Cardiovascular: S1 normal and S2 normal. Exam reveals no gallop.  Murmur heard.  Systolic murmur is present with a grade of 2/6. Pulses:      Dorsalis pedis pulses are 2+ on the right side, and 2+ on the left side.  Respiratory: No respiratory distress. He has decreased breath sounds in the right middle field, the right lower field, the left middle field and the left lower field. He has no wheezes. He has no rhonchi. He has no rales.  GI:  Soft. Bowel sounds are normal. There is no tenderness.  Musculoskeletal:       Right ankle: He exhibits no swelling.       Left ankle: He exhibits no swelling.  Lymphadenopathy:    He has no cervical adenopathy.  Neurological: He is alert and oriented to person, place, and time. No cranial nerve deficit.  Skin: Skin is warm. No rash noted. Nails show no clubbing.  Psychiatric: He has a normal mood and affect.      Data Reviewed: Basic Metabolic Panel: Recent Labs  Lab 11/20/17 2234 11/21/17 0519  NA 138  --   K 3.1*  --   CL 103  --   CO2 24  --   GLUCOSE 279*  --   BUN 9  --   CREATININE 0.79  --   CALCIUM 8.8*  --   MG  --  2.0   Liver Function Tests: Recent Labs  Lab 11/20/17 2330  AST 42*  ALT 42  ALKPHOS 60  BILITOT 0.8  PROT 6.5  ALBUMIN 3.7    Recent Labs  Lab 11/20/17 2330  AMMONIA 34   CBC: Recent Labs  Lab 11/20/17 2234  WBC 10.8*  NEUTROABS 7.7*  HGB 14.3  HCT 42.4  MCV 90.4  PLT 194   Cardiac Enzymes: Recent Labs  Lab 11/20/17 2234 11/21/17 0519  TROPONINI <0.03 <0.03   BNP (last 3 results) Recent Labs    11/20/17 2235  BNP 38.0  CBG: Recent Labs  Lab 11/21/17 0250 11/21/17 0839 11/21/17 1151  GLUCAP 293* 327* 397*    Recent Results (from the past 240 hour(s))  Respiratory Panel by PCR     Status: None   Collection Time: 11/21/17  8:42 AM  Result Value Ref Range Status   Adenovirus NOT DETECTED NOT DETECTED Final   Coronavirus 229E NOT DETECTED NOT DETECTED Final   Coronavirus HKU1 NOT DETECTED NOT DETECTED Final   Coronavirus NL63 NOT DETECTED NOT DETECTED Final   Coronavirus OC43 NOT DETECTED NOT DETECTED Final   Metapneumovirus NOT DETECTED NOT DETECTED Final   Rhinovirus / Enterovirus NOT DETECTED NOT DETECTED Final   Influenza A NOT DETECTED NOT DETECTED Final   Influenza B NOT DETECTED NOT DETECTED Final   Parainfluenza Virus 1 NOT DETECTED NOT DETECTED Final   Parainfluenza Virus 2 NOT DETECTED NOT  DETECTED Final   Parainfluenza Virus 3 NOT DETECTED NOT DETECTED Final   Parainfluenza Virus 4 NOT DETECTED NOT DETECTED Final   Respiratory Syncytial Virus NOT DETECTED NOT DETECTED Final   Bordetella pertussis NOT DETECTED NOT DETECTED Final   Chlamydophila pneumoniae NOT DETECTED NOT DETECTED Final   Mycoplasma pneumoniae NOT DETECTED NOT DETECTED Final     Studies: Dg Chest 2 View  Result Date: 11/20/2017 CLINICAL DATA:  Swelling of the feet, abdomen and neck x5 days. EXAM: CHEST - 2 VIEW COMPARISON:  None. FINDINGS: Chronic eventration of the left hemidiaphragm with bibasilar left greater than right atelectasis. Borderline cardiomegaly with mild aortic atherosclerosis. Median sternotomy sutures are in place. Left anterior chest wall pacemaking apparatus is noted with leads projecting over the right heart. Aortic valvular replacement is noted. IMPRESSION: Chronic basilar atelectasis and/or scarring left greater than right. No active pulmonary disease. Chronic elevation/eventration of the left hemidiaphragm. Electronically Signed   By: Tollie Eth M.D.   On: 11/20/2017 22:52   Ct Head Wo Contrast  Result Date: 11/20/2017 CLINICAL DATA:  42 year old male with altered mental status. EXAM: CT HEAD WITHOUT CONTRAST TECHNIQUE: Contiguous axial images were obtained from the base of the skull through the vertex without intravenous contrast. COMPARISON:  None. FINDINGS: Brain: The ventricles and sulci appropriate in size for patient's age. The gray-white matter discrimination is preserved. Subcentimeter hypodense focus in the region of the right basal ganglia likely represents a dilated prevascular space and less likely an old lacunar infarct. There is no acute intracranial hemorrhage. No mass effect or midline shift. No extra-axial fluid collection. Vascular: No hyperdense vessel or unexpected calcification. Skull: No acute findings. Sinuses/Orbits: No acute finding. Other: None IMPRESSION: Unremarkable  noncontrast CT of the brain. Electronically Signed   By: Elgie Collard M.D.   On: 11/20/2017 23:52    Scheduled Meds: . acetaminophen  650 mg Oral Once  . aspirin EC  81 mg Oral Daily  . atorvastatin  10 mg Oral Daily  . buPROPion  200 mg Oral BID  . furosemide  40 mg Oral Daily  . insulin aspart  0-15 Units Subcutaneous TID WC  . insulin aspart  0-5 Units Subcutaneous QHS  . insulin detemir  10 Units Subcutaneous NOW  . ipratropium-albuterol  3 mL Nebulization Q4H  . losartan  25 mg Oral Daily  . OLANZapine  2.5 mg Oral QHS  . predniSONE  40 mg Oral Q breakfast  . warfarin  5 mg Oral Q T,Th,S,Su  . warfarin  7.5 mg Oral Q M,W,F  . Warfarin - Physician Dosing Inpatient   Does not apply  q1800   Continuous Infusions:  Assessment/Plan:  1. Acute hypoxic respiratory failure.  Try to taper oxygen off today.  Try to obtain pulse ox on room air. 2. COPD exacerbation.  Given 1 dose of Solu-Medrol now on prednisone.  Continue nebulizer treatments. 3. Type 2 diabetes mellitus.  Give 1 dose of Levemir and increase sliding scale while on steroids. 4. History of aortic valve replacement.  Therapeutic on Coumadin 5. Hyperlipidemia unspecified on Lipitor 6. History of sleep apnea on CPAP and oxygen at night  Code Status:     Code Status Orders  (From admission, onward)        Start     Ordered   11/21/17 0214  Full code  Continuous     11/21/17 0213    Code Status History    Date Active Date Inactive Code Status Order ID Comments User Context   12/01/2014 1515 12/08/2014 1905 Full Code [id number]  Rowe Clack, PA-C Inpatient   11/25/2014 1551 12/01/2014 1515 Full Code [id number]  Charlynne Pander, DDS Inpatient   11/25/2014 1010 11/25/2014 1551 Full Code [id number]  Marinus Maw, MD Inpatient   11/21/2014 1459 11/25/2014 1010 Full Code [id number]  Ok Anis, NP Inpatient   11/21/2014 0318 11/21/2014 1459 Full Code [id number]  Arnaldo Natal, MD Inpatient   10/04/2014 1008  10/07/2014 2033 Full Code [id number]  Jacinta Shoe Inpatient   10/04/2014 0914 10/04/2014 1008 Full Code [id number]  Marykay Lex, MD Inpatient   10/04/2014 0142 10/04/2014 0914 Full Code [id number]  Hower, Cletis Athens, MD ED      Disposition Plan: To be determined based on clinical course  Consultants:  Cardiology  Time spent: 35 minutes  [employer]

## 2017-11-21 NOTE — ED Notes (Signed)
Admitting MD to bedside 

## 2017-11-21 NOTE — Progress Notes (Signed)
Inpatient Diabetes Program Recommendations  AACE/ADA: New Consensus Statement on Inpatient Glycemic Control (2019)  Target Ranges:  Prepandial:   less than 140 mg/dL      Peak postprandial:   less than 180 mg/dL (1-2 hours)      Critically ill patients:  140 - 180 mg/dL   Results for REDD, RENDE (MRN [id number]) as of 11/21/2017 08:55  Ref. Range 11/21/2017 02:50 11/21/2017 08:39  Glucose-Capillary Latest Ref Range: 70 - 99 mg/dL 974 (H)  Novolog 8 units 327 (H)   Review of Glycemic Control  Diabetes history: DM2 Outpatient Diabetes medications: Metformin 500 mg BID Current orders for Inpatient glycemic control: Novolog 0-15 units TID with meals: Prednisone 40 mg QAM  Inpatient Diabetes Program Recommendations:  Insulin - Basal: Please consider ordering one time Levemir 10 units x 1 now. Will continue to assess daily to determine if basal insulin is needed. Correction (SSI): Please consider adding Novolog 0-5 units QHS for bedtime correction scale. HgbA1C: Noted in Care Everywhere, A1C was 6.9% on 06/25/2017. Please consider ordering an A1C to evaluate glycemic control over the past 2-3 months.  NOTE: In reviewing chart, noted patient received Solumedrol 125 mg x 1 at 00:40 on 11/21/17 which has contributed to noted hyperglycemia. Recommend to order one time basal insulin dose and assess daily to determine if basal insulin is needed. Also, recommend adding Novolog bedtime correction scale and ordering a current A1C.  Thanks, Orlando Penner, RN, MSN, CDE Diabetes Coordinator Inpatient Diabetes Program [phone] (Team Pager from 8am to 5pm)

## 2017-11-21 NOTE — Consult Note (Signed)
Cardiology Consultation:   Patient ID: Nicholas Horton; [id number]; Jan 07, 1976   Admit date: 11/20/2017 Date of Consult: 11/21/2017  Primary Care Provider: Etheleen Nicks, NP Primary Cardiologist: Herbie Baltimore Primary Electrophysiologist:  Graciela Husbands   Patient Profile:   Nicholas Horton is a 42 y.o. male with a hx of chronic systolic CHF secondary to nonischemic cardiomyopathy, diastolic dysfunction, history of complete heart block status post CRT P in 11/2014, bicuspid aortic valve status post mechanical aortic valve replacement in 11/2014 on aspirin and Coumadin, chronic shortness of breath dating back to his surgery in 11/2014, COPD, morbid obesity, OSA on CPAP, physical deconditioning, hypertension, and hyperlipidemia who is being seen today for the evaluation of SOB at the request of Dr. Renae Gloss.  History of Present Illness:   Mr. Nicholas Horton was admitted to the hospital in 09/2014 with acute combined systolic and diastolic CHF and complete heart block.  He underwent LHC that showed minimal angiographic CAD with 20% stenosis in the mid LAD, dilated cardiomyopathy with an EF of 20 to 25%, global hypokinesis, moderate to severe aortic stenosis, elevated LVEDP of 30 to 40 mmHg, third-degree AV block with accelerated junctional escape beats.  He underwent successful placement of a venous temp wire with a back-up rate of 40 bpm.  He was successfully diuresed.  His high-grade AV block normalized and initially, no PPM was arranged.  Cardiac MRI in 09/2014 did not suggest infiltrative disease with an LVEF of 43%.  He was readmitted to the hospital in 10/2014 with chest pain and syncope.  Troponin was minimally elevated and felt to be secondary to demand ischemia.  Echo showed an LVEF of 50 to 55%, moderately severe aortic stenosis with a mean gradient of 33 mmHg and a peak gradient of 61 mmHg.  No significant arrhythmia was noted on telemetry.  He was discharged and subsequently readmitted on the same day with a  recurrent syncopal episode.  It was felt the patient required AVR however he required extensive dental extractions prior to this.  He ultimately underwent multiple dental extractions through [facility].  He was noted to have complete heart block on telemetry that again required temporary venous pacer wire.  Pacemaker implantation was delayed secondary to needing for multiple dental extractions with concern for bacteremia.  Patient ultimately underwent mechanical aortic valve replacement as well as MDT CRT P on 12/01/2014.  Following this procedure, the patient has noted chronic waxing and waning dyspnea on exertion.  He has been maintained on aspirin, Coumadin, Lasix 40 mg every morning with a as needed 20 mg dose for shortness of breath or weight gain.  It appears beta-blocker therapy has been intermittent.  Patient indicates he has had chronic shortness of breath that has been unchanged since his mechanical aortic valve replacement in 11/2014.  He indicates his shortness of breath waxes and wanes and this has been stable.  He reports his girlfriend urged him to come to the hospital secondary to intermittent several minute episodes of confusion over the past several days.  Patient was unaware he was having these.  Patient denies any palpitations, increased shortness of breath, chest pain, dizziness, presyncope, or syncope.  No recent fevers or chills.  No recent infections.  He has noted intermittent left pedal swelling as well as some abdominal distention.  Weight has remained stable at home at approximately 260 to 262 pounds.  He has been compliant with his 40 mg of Lasix each morning.  He did take a as needed dose of 20 mg  Lasix on the evening of 6/24, secondary to pedal edema with improvement in symptoms.  Compliant with Coumadin.  Upon the patient's arrival to [facility] they were found to have BP 135/84, HR 99 bpm, temp 99 F, oxygen saturation 93 % on room air, weight somewhere between 260 and 266 pounds. EKG showed  paced rhythm as below, CXR showed chronic bibasilar atelectasis and/or scarring left greater than right without active process and chronic elevation of the left hemidiaphragm. Labs showed BNP 38, troponin negative x 2, TSH 1.027, ethanol < 10, ammonia 34, UDS negative, Na 138, K+ 3.1, glucose 279, SCr 0.79, WBC 10.8, HGB 14.3, PLT 194, AST 42, INR 2.12, Mg++ 2.0, respiratory panel negative.  Patient was started on azithromycin, prednisone, and nebulizer.  He was continued on PTA medications.  Potassium has been repleted orally.  Upon dayshift hospitalist taking over cardiology was consulted for shortness of breath.    Past Medical History:  Diagnosis Date  . CHF (congestive heart failure) (HCC)   . Congenital bicuspid aortic valve 09/2014   - s/p AVR  . Nonischemic cardiomyopathy (HCC) -- Resolved[I42.9] 09/2014   EF by Echo 20-25% (pre-op AVR) --> Echo 10/2014 & 03/2015: EF 50-55% - also Recent Normal Diastolic parameters   . S/P AVR (aortic valve replacement) 10/2014   21 mm Carbometrics Mechanical Valve -- Epicardial PPM Leads  . Severe aortic stenosis 10/04/2014   Presented with Syncope & CHF  . Third degree heart block (HCC) 10/04/2014   Transient CHB related to AS -- s/p PPM    Past Surgical History:  Procedure Laterality Date  . AORTIC VALVE REPLACEMENT N/A 12/01/2014   Procedure: AORTIC VALVE REPLACEMENT (AVR);  Surgeon: Kerin Perna, MD;  Location: [facility] OR;  Service: Open Heart Surgery;  Laterality: N/A;  . CARDIAC CATHETERIZATION N/A 10/04/2014   Procedure: Left Heart Cath and Coronary Angiography;  Surgeon: Marykay Lex, MD;  Location: [facility] INVASIVE CV LAB;  Service: Cardiovascular;  Laterality: N/A;  . CARDIAC CATHETERIZATION  10/04/2014   Procedure: Temporary Pacemaker;  Surgeon: Marykay Lex, MD;  Location: [facility] INVASIVE CV LAB;  Service: Cardiovascular;;  . CARDIAC CATHETERIZATION N/A 11/25/2014   Procedure: Temporary Pacemaker;  Surgeon: Marinus Maw, MD;  Location: [facility] INVASIVE CV  LAB;  Service: Cardiovascular;  Laterality: N/A;  . MULTIPLE EXTRACTIONS WITH ALVEOLOPLASTY N/A 11/25/2014   Procedure: Extraction of tooth #'s 1,2,3,4,5,6,7,9, 10, 11, 12,13,14,16, 19, 20, 21, 22, 23, 24, 25, 26, 27, 28, 29, 30, 31 with alveoloplasty;  Surgeon: Charlynne Pander, DDS;  Location: [facility] OR;  Service: Oral Surgery;  Laterality: N/A;  . SURGERY SCROTAL / TESTICULAR     for undescended testicle as a child  . TEE WITHOUT CARDIOVERSION N/A 12/01/2014   Procedure: TRANSESOPHAGEAL ECHOCARDIOGRAM (TEE);  Surgeon: Kerin Perna, MD;  Location: [facility] OR;  Service: Open Heart Surgery;  Laterality: N/A;  . TRANSTHORACIC ECHOCARDIOGRAM  03/2015   Mildly dilated LV. EF 50 at 55%. Normal diastolic parameters. Mild to moderate stenosis of prosthetic aortic valve. Mild to moderate regurgitation     Home Meds: Prior to Admission medications   Medication Sig Start Date End Date Taking? Authorizing Provider  amoxicillin (AMOXIL) 500 MG capsule Take 4 capsules (2,000 mg total) by mouth as needed. One hour prior to dental procedure 08/07/17  Yes Marykay Lex, MD  aspirin EC 81 MG EC tablet Take 1 tablet (81 mg total) by mouth daily. 10/07/14  Yes Emokpae, Ejiroghene E, MD  atorvastatin (LIPITOR) 20  MG tablet Take 0.5 tablets (10 mg total) by mouth daily. 02/01/17  Yes Marykay Lex, MD  dicyclomine (BENTYL) 10 MG capsule Take by mouth daily as needed.  07/11/17 07/11/18 Yes [provider]  furosemide (LASIX) 40 MG tablet Take one tablet by mouth daily and may take an extra one dose additional if needed 07/07/16  Yes Marykay Lex, MD  losartan (COZAAR) 25 MG tablet Take 1 tablet (25 mg total) by mouth daily. 07/07/16 11/20/17 Yes Marykay Lex, MD  metFORMIN (GLUCOPHAGE) 500 MG tablet Take 500 mg by mouth 2 (two) times daily. 09/30/16 11/20/17 Yes [provider]  OLANZapine (ZYPREXA) 2.5 MG tablet Take 0.5 tablets (1.25 mg total) by mouth at bedtime. Patient taking differently: Take 2.5  mg by mouth at bedtime.  08/17/16 11/20/17 Yes Willy Eddy, MD  ondansetron (ZOFRAN ODT) 8 MG disintegrating tablet Take 1 tablet (8 mg total) by mouth every 8 (eight) hours as needed for nausea or vomiting. 06/14/15  Yes Sharman Cheek, MD  warfarin (COUMADIN) 5 MG tablet TAKE AS DIRECTED BY COUMADIN CLINIC Patient taking differently: 4 (four) times a week. TAKE AS DIRECTED BY COUMADIN CLINIC 08/02/16  Yes Duke Salvia, MD  warfarin (COUMADIN) 7.5 MG tablet Take 7.5 mg by mouth 3 (three) times a week.   Yes [provider]  BuPROPion HCl (WELLBUTRIN PO) Take 200 mg by mouth 2 (two) times daily.     [provider]  umeclidinium-vilanterol (ANORO ELLIPTA) 62.5-25 MCG/INH AEPB Inhale into the lungs. 09/10/17 09/10/18  [provider]    Inpatient Medications: Scheduled Meds: . acetaminophen  650 mg Oral Once  . aspirin EC  81 mg Oral Daily  . atorvastatin  10 mg Oral Daily  . buPROPion  200 mg Oral BID  . furosemide  40 mg Oral Daily  . insulin aspart  0-15 Units Subcutaneous TID WC  . insulin aspart  0-5 Units Subcutaneous QHS  . insulin detemir  10 Units Subcutaneous NOW  . ipratropium-albuterol  3 mL Nebulization Q4H  . losartan  25 mg Oral Daily  . OLANZapine  2.5 mg Oral QHS  . predniSONE  40 mg Oral Q breakfast  . warfarin  5 mg Oral Q T,Th,S,Su  . warfarin  7.5 mg Oral Q M,W,F  . Warfarin - Physician Dosing Inpatient   Does not apply q1800   Continuous Infusions:  PRN Meds: acetaminophen **OR** acetaminophen, bisacodyl, dicyclomine, ondansetron **OR** ondansetron (ZOFRAN) IV, senna-docusate  Allergies:  No Known Allergies  Social History:   Social History   Socioeconomic History  . Marital status: Single    Spouse name: Not on file  . Number of children: Not on file  . Years of education: Not on file  . Highest education level: Not on file  Occupational History  . 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  Social Needs  . Financial  resource strain: Not on file  . Food insecurity:    Worry: Not on file    Inability: Not on file  . Transportation needs:    Medical: Not on file    Non-medical: Not on file  Tobacco Use  . Smoking status: Former Smoker    Packs/day: 2.00    Years: 20.00    Pack years: 40.00    Types: Cigarettes  . Smokeless tobacco: Never Used  . Tobacco comment: quit 10/04/14  Substance and Sexual Activity  . Alcohol use: No    Alcohol/week: 0.0 oz    Comment:  rarely  . Drug use: No  . Sexual activity: Yes  Lifestyle  . Physical activity:    Days per week: Not on file    Minutes per session: Not on file  . Stress: Not on file  Relationships  . Social connections:    Talks on phone: Not on file    Gets together: Not on file    Attends religious service: Not on file    Active member of club or organization: Not on file    Attends meetings of clubs or organizations: Not on file    Relationship status: Not on file  . Intimate partner violence:    Fear of current or ex partner: Not on file    Emotionally abused: Not on file    Physically abused: Not on file    Forced sexual activity: Not on file  Other Topics Concern  . Not on file  Social History Narrative   Lives in [place], [place] with wife and daughter.    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      Family History:   Family History  Problem Relation Age of Onset  . CAD Neg Hx   . Hypertension Neg Hx     ROS:  Review of Systems  Constitutional: Positive for malaise/fatigue. Negative for chills, diaphoresis, fever and weight loss.  HENT: Negative for congestion.   Eyes: Negative for discharge and redness.  Respiratory: Positive for shortness of breath. Negative for cough, hemoptysis, sputum production and wheezing.        Chronic shortness of breath unchanged for the past 2.5 years  Cardiovascular: Negative for chest pain, palpitations, orthopnea, claudication, leg swelling and PND.    Gastrointestinal: Negative for abdominal pain, blood in stool, heartburn, melena, nausea and vomiting.  Genitourinary: Negative for hematuria.  Musculoskeletal: Negative for falls and myalgias.  Skin: Negative for rash.  Neurological: Positive for weakness. Negative for dizziness, tingling, tremors, sensory change, speech change, focal weakness and loss of consciousness.       AMS  Endo/Heme/Allergies: Does not bruise/bleed easily.  Psychiatric/Behavioral: Negative for substance abuse. The patient is not nervous/anxious.   All other systems reviewed and are negative.     Physical Exam/Data:   Vitals:   11/21/17 4098 11/21/17 0836 11/21/17 0837 11/21/17 1133  BP: 124/82     Pulse: 79 82  98  Resp:      Temp:   98.1 F (36.7 C)   TempSrc:   Oral   SpO2:  94%  95%  Weight:      Height:        Intake/Output Summary (Last 24 hours) at 11/21/2017 1239 Last data filed at 11/21/2017 0900 Gross per 24 hour  Intake -  Output 700 ml  Net -700 ml   Filed Weights   11/20/17 2233 11/21/17 0210  Weight: 260 lb (117.9 kg) 266 lb (120.7 kg)   Body mass index is 34.15 kg/m.   Physical Exam: General: Well developed, well nourished, in no acute distress. Head: Normocephalic, atraumatic, sclera non-icteric, no xanthomas, nares without discharge.  Neck: Negative for carotid bruits. JVD not elevated. Lungs: Clear bilaterally to auscultation without wheezes, rales, or rhonchi. Breathing is unlabored. Heart: RRR with S1 S2. III/VI systolic murmur RUSB with mechanical click, no rubs, or gallops appreciated. Abdomen: Soft, non-tender, non-distended with normoactive bowel sounds. No hepatomegaly. No rebound/guarding. No obvious abdominal masses. Msk:  Strength and tone appear normal for age. Extremities: No clubbing or cyanosis. No  edema. Distal pedal pulses are 2+ and equal bilaterally. Neuro: Alert and oriented X 3. No facial asymmetry. No focal deficit. Moves all extremities  spontaneously. Psych:  Responds to questions appropriately with a normal affect.   EKG:  The EKG was personally reviewed and demonstrates: V paced, 99 bpm Telemetry:  Telemetry was personally reviewed and demonstrates: paced  Weights: [facility] Weights   11/20/17 2233 11/21/17 0210  Weight: 260 lb (117.9 kg) 266 lb (120.7 kg)    Relevant CV Studies: TTE 08/2017: Study Conclusions  - Procedure narrative: Transthoracic echocardiography. Image   quality was adequate. The study was technically difficult, as a   result of poor acoustic windows and body habitus. - Left ventricle: The cavity size was at the upper limits of   normal. Wall thickness was normal. Systolic function was   moderately reduced. The estimated ejection fraction was in the   range of 35% to 40%. Probable hypokinesis of the anterior and   anterolateral myocardium. Doppler parameters are consistent with   abnormal left ventricular relaxation (grade 1 diastolic   dysfunction). - Aortic valve: A mechanical prosthesis was present. Transvalvular   velocity was increased. There was mild to moderate stenosis,   similar to prior study in 08/2016. Peak velocity (S): 333 cm/s.   Mean gradient (S): 24 mm Hg. - Left atrium: The atrium was mildly dilated. - Right ventricle: The cavity size was normal. Systolic function   was normal. - Right atrium: The atrium was mildly dilated.   Device interrogation 10/2017: <0.1% AT/AF burden, max dur. . (4) VT episodes, most recent 08/14/17, durations 1 sec. 99.9%Bp.  Laboratory Data:  Chemistry Recent Labs  Lab 11/20/17 2234  NA 138  K 3.1*  CL 103  CO2 24  GLUCOSE 279*  BUN 9  CREATININE 0.79  CALCIUM 8.8*  GFRNONAA >60  GFRAA >60  ANIONGAP 11    Recent Labs  Lab 11/20/17 2330  PROT 6.5  ALBUMIN 3.7  AST 42*  ALT 42  ALKPHOS 60  BILITOT 0.8   Hematology Recent Labs  Lab 11/20/17 2234  WBC 10.8*  RBC 4.70  HGB 14.3  HCT 42.4  MCV 90.4  MCH 30.5  MCHC  33.8  RDW 13.7  PLT 194   Cardiac Enzymes Recent Labs  Lab 11/20/17 2234 11/21/17 0519  TROPONINI <0.03 <0.03   No results for input(s): TROPIPOC in the last 168 hours.  BNP Recent Labs  Lab 11/20/17 2235  BNP 38.0    DDimer No results for input(s): DDIMER in the last 168 hours.  Radiology/Studies:  Dg Chest 2 View  Result Date: 11/20/2017 IMPRESSION: Chronic basilar atelectasis and/or scarring left greater than right. No active pulmonary disease. Chronic elevation/eventration of the left hemidiaphragm. Electronically Signed   By: Tollie Eth M.D.   On: 11/20/2017 22:52   Ct Head Wo Contrast  Result Date: 11/20/2017 IMPRESSION: Unremarkable noncontrast CT of the brain. Electronically Signed   By: Elgie Collard M.D.   On: 11/20/2017 23:52    Assessment and Plan:   1.  Chronic shortness of breath: -Noted since the patient underwent mechanical AVR in 11/2014 -Waxes and wanes -Has been felt to be multifactorial including obesity, possible OSA, and COPD as well as deconditioning -Unchanged from his baseline shortness of breath -Given findings on prior echocardiogram and device interrogation cannot rule out possible pacemaker role -We will have EP evaluate the patient on 6/27 -Patient may benefit from a gentle diuresis -Respiratory panel negative -No symptoms concerning  for ischemia at this time -Troponin negative x2, will cycle 1 further evaluate to completely rule out -Recent echocardiogram in 08/2017 as above, no need to repeat -Monitor on telemetry -Device interrogation  2.  AMS: -This was the patient's primary reason for presentation to the ED -Of uncertain etiology -Per IM  3.  Acute on chronic combined CHF secondary to nonischemic cardiomyopathy: -Recent TTE from 08/2017 demonstrated worsening of his EF as detailed above, though second interpretation felt like his EF was approximately 50% which was closer to his baseline, this may be related to dyssynchrony from  his pacemaker -Patient's weights at home have been stable ranging between 260 and 262 pounds -Weights upon arriving to [facility] are difficult to interpret given a weight of 260 pounds on 6/25 and 266 pounds on 6/26 -Continue p.o. Lasix 40 mg daily with a 20 mg dose as needed shortness of breath/weight gain -Not on beta-blocker, possibly secondary to chronic shortness of breath, defer to EP -Continue losartan for afterload reduction -Consider spironolactone  4.  Bicuspid mechanical aortic valve status post mechanical AVR in 2016: -Recent echocardiogram as above showed stable valve function -INR 2.12 with a goal of 2.03.0 -Continue aspirin and Coumadin  5.  Essential hypertension: -Blood pressure well controlled, continue current medications  6.  Hyperlipidemia: -Continue Crestor and outpatient follow-up  7.  Hypokalemia: -Recommend repletion to goal of greater than 4.0 per IM  8.  Leukocytosis: -Per IM  9.  Hyperglycemia: -Recommend hemoglobin A1c, defer to IM  10.  OSA: -CPAP  11.  Elevated AST: -Per IM   For questions or updates, please contact [facility] Please consult [email or web address] for contact info under Cardiology/STEMI.   Signed, Eula Listen, PA-C [facility] Pager: [phone] 11/21/2017, 12:39 PM

## 2017-11-21 NOTE — ED Notes (Signed)
ED Provider at bedside. 

## 2017-11-22 LAB — BLOOD GAS, ARTERIAL
ACID-BASE EXCESS: 2.9 mmol/L — AB (ref 0.0–2.0)
Bicarbonate: 27.9 mmol/L (ref 20.0–28.0)
FIO2: 0.21
O2 Saturation: 92.2 %
PH ART: 7.42 (ref 7.350–7.450)
Patient temperature: 37
pCO2 arterial: 43 mmHg (ref 32.0–48.0)
pO2, Arterial: 63 mmHg — ABNORMAL LOW (ref 83.0–108.0)

## 2017-11-22 LAB — GLUCOSE, CAPILLARY
GLUCOSE-CAPILLARY: 176 mg/dL — AB (ref 70–99)
GLUCOSE-CAPILLARY: 340 mg/dL — AB (ref 70–99)
GLUCOSE-CAPILLARY: 303 mg/dL — AB (ref 70–99)
Glucose-Capillary: 239 mg/dL — ABNORMAL HIGH (ref 70–99)

## 2017-11-22 LAB — RPR: RPR Ser Ql: NONREACTIVE

## 2017-11-22 LAB — PROTIME-INR
INR: 2.66
Prothrombin Time: 28.1 seconds — ABNORMAL HIGH (ref 11.4–15.2)

## 2017-11-22 LAB — HEMOGLOBIN A1C
HEMOGLOBIN A1C: 6.8 % — AB (ref 4.8–5.6)
Mean Plasma Glucose: 148.46 mg/dL

## 2017-11-22 MED ORDER — INSULIN DETEMIR 100 UNIT/ML ~~LOC~~ SOLN
10.0000 [IU] | Freq: Every day | SUBCUTANEOUS | Status: DC
  Administered 2017-11-22 – 2017-11-23 (×2): 10 [IU] via SUBCUTANEOUS
  Filled 2017-11-22 (×2): qty 0.1

## 2017-11-22 MED ORDER — SODIUM CHLORIDE 0.9% FLUSH
3.0000 mL | Freq: Two times a day (BID) | INTRAVENOUS | Status: DC
Start: 2017-11-22 — End: 2017-11-23
  Administered 2017-11-22 – 2017-11-23 (×2): 3 mL via INTRAVENOUS

## 2017-11-22 NOTE — Progress Notes (Signed)
PT Cancellation Note  Patient Details Name: Nicholas Horton MRN: [id number] DOB: 01-22-1976   Cancelled Treatment:    Reason Eval/Treat Not Completed: PT screened, no needs identified, will sign off.  Order received.  Chart reviewed.  Pt independent in all mobility and demonstrates no balance or strength deficits.  Will complete order for now.  Please consult if needs arise in the future.   Glenetta Hew, PT, DPT 11/22/2017, 2:37 PM

## 2017-11-22 NOTE — Progress Notes (Addendum)
Inpatient Diabetes Program Recommendations  AACE/ADA: New Consensus Statement on Inpatient Glycemic Control (2019)  Target Ranges:  Prepandial:   less than 140 mg/dL      Peak postprandial:   less than 180 mg/dL (1-2 hours)      Critically ill patients:  140 - 180 mg/dL  Results for Nicholas Horton, Nicholas Horton (MRN [id number]) as of 11/22/2017 09:32  Ref. Range 11/21/2017 08:39 11/21/2017 11:51 11/21/2017 17:21 11/21/2017 21:18 11/22/2017 07:48  Glucose-Capillary Latest Ref Range: 70 - 99 mg/dL 272 (H) 536 (H) 644 (H) 277 (H) 176 (H)    Review of Glycemic Control  Diabetes history: DM2 Outpatient Diabetes medications: Metformin 500 mg BID Current orders for Inpatient glycemic control: Novolog 0-15 units TID with meals, Novolog 0-5 units QHS; Prednisone 40 mg QAM  Inpatient Diabetes Program Recommendations:  Insulin - Basal: If steroids are continued, please consider ordering Levemir 10 units daily (starting today). HgbA1C: A1C in process. Outpatient DM medications:  Patient reports CBGs range from 170-235 mg/dl. A1C is still in process to determine average glucose over the past 2-3 months.  MD, may want to consider adjusting outpatient DM medications at time of discharge.  NOTE: One time dose of Levemir 10 units given on 11/21/17 and fasting glucose 176 mg/dl today. Glucose went up to 397 mg/dl on 0/34/74. Steroids contributing to hyperglycemia. Based on glucose trends and amount of insulin given over the past 24 hours, recommend ordering daily basal insulin at this time. A1C in process; will plan to follow up with patient on 11/23/17 if A1C is elevated and patient still an inpatient.  Addendum 11/22/17@12 :45-Spoke with patient about diabetes and home regimen for diabetes control. Patient reports that he is followed by PCP for diabetes management and currently he takes Metformin 500 mg BID as an outpatient for diabetes control. Patient reports that he is taking Metformin as prescribed and that he has been on the  same dose of Metformin for over 6 months.  Patient states that he checks his glucose 1-2 times per day and that it has ranged from 170-235 mg/dl over the past 2 weeks.  Patient reports that his last A1C was good. Informed patient that his current A1C is in process at this time. Discussed glucose and A1C goals. Discussed importance of checking CBGs and maintaining good CBG control to prevent long-term and short-term complications. Discussed impact of nutrition, exercise, stress, sickness, and medications on diabetes control. Informed patient that since he is ordered and receiving steroids at this time, his glucose is more elevated due to the steroids. Explained that if his glucose is trending from 170-235 mg/dl, he most likely needs to have adjustments with DM medications. Patient verbalized understanding of information discussed and he states that he has no further questions at this time related to diabetes.   Thanks, Orlando Penner, RN, MSN, CDE Diabetes Coordinator Inpatient Diabetes Program [phone] (Team Pager from 8am to 5pm)

## 2017-11-22 NOTE — Progress Notes (Signed)
Patient walked around the nurses station x4 on room air with no issues, no complaints of shortness of breath or chest pain. Will continue to monitor patient.

## 2017-11-22 NOTE — Progress Notes (Signed)
Patient's girlfriend stated patient wore a cpap at night. Noted patient had worn bipap at night and has orders for bipap. Dr.Wieiting paged to notify. MD put in orders to change to cpap. Patient has been on room air during the day tolerating well. Will continue to monitor patient.

## 2017-11-22 NOTE — Progress Notes (Signed)
Patient ID: Nicholas Horton, male   DOB: 11-16-1975, 42 y.o.   MRN: [id number]    [facility] PROGRESS NOTE  Thai Burgueno XLK:[id number] DOB: [birth year] DOA: 11/20/2017 PCP: Etheleen Nicks, NP  HPI/Subjective: Wife states that he has had periods of confusion.  He has a CPAP at home and then not quite sure if it is working properly.  He states his breathing is a little bit better.  Objective: Vitals:   11/22/17 1141 11/22/17 1548  BP:    Pulse:    Resp:    Temp:    SpO2: 97% 97%    Filed Weights   11/20/17 2233 11/21/17 0210 11/22/17 0611  Weight: 117.9 kg (260 lb) 120.7 kg (266 lb) 119.4 kg (263 lb 3.2 oz)    ROS: Review of Systems  Constitutional: Negative for chills and fever.  Eyes: Negative for blurred vision.  Respiratory: Positive for shortness of breath. Negative for cough.   Cardiovascular: Negative for chest pain.  Gastrointestinal: Negative for abdominal pain, constipation, diarrhea, nausea and vomiting.  Genitourinary: Negative for dysuria.  Musculoskeletal: Negative for joint pain.  Neurological: Negative for dizziness and headaches.   Exam: Physical Exam  Constitutional: He is oriented to person, place, and time.  HENT:  Nose: No mucosal edema.  Mouth/Throat: No oropharyngeal exudate or posterior oropharyngeal edema.  Eyes: Pupils are equal, round, and reactive to light. Conjunctivae, EOM and lids are normal.  Neck: No JVD present. Carotid bruit is not present. No edema present. No thyroid mass and no thyromegaly present.  Cardiovascular: S1 normal and S2 normal. Exam reveals no gallop.  Murmur heard.  Systolic murmur is present with a grade of 2/6. Pulses:      Dorsalis pedis pulses are 2+ on the right side, and 2+ on the left side.  Respiratory: No respiratory distress. He has decreased breath sounds in the right middle field, the right lower field, the left middle field and the left lower field. He has no wheezes. He has no rhonchi. He has no  rales.  GI: Soft. Bowel sounds are normal. There is no tenderness.  Musculoskeletal:       Right ankle: He exhibits no swelling.       Left ankle: He exhibits no swelling.  Lymphadenopathy:    He has no cervical adenopathy.  Neurological: He is alert and oriented to person, place, and time. No cranial nerve deficit.  Skin: Skin is warm. No rash noted. Nails show no clubbing.  Psychiatric: He has a normal mood and affect.      Data Reviewed: Basic Metabolic Panel: Recent Labs  Lab 11/20/17 2234 11/21/17 0519  NA 138  --   K 3.1*  --   CL 103  --   CO2 24  --   GLUCOSE 279*  --   BUN 9  --   CREATININE 0.79  --   CALCIUM 8.8*  --   MG  --  2.0   Liver Function Tests: Recent Labs  Lab 11/20/17 2330  AST 42*  ALT 42  ALKPHOS 60  BILITOT 0.8  PROT 6.5  ALBUMIN 3.7    Recent Labs  Lab 11/20/17 2330  AMMONIA 34   CBC: Recent Labs  Lab 11/20/17 2234  WBC 10.8*  NEUTROABS 7.7*  HGB 14.3  HCT 42.4  MCV 90.4  PLT 194   Cardiac Enzymes: Recent Labs  Lab 11/20/17 2234 11/21/17 0519  TROPONINI <0.03 <0.03   BNP (last 3 results) Recent Labs  11/20/17 2235  BNP 38.0    CBG: Recent Labs  Lab 11/21/17 1151 11/21/17 1721 11/21/17 2118 11/22/17 0748 11/22/17 1202  GLUCAP 397* 331* 277* 176* 340*    Recent Results (from the past 240 hour(s))  Respiratory Panel by PCR     Status: None   Collection Time: 11/21/17  8:42 AM  Result Value Ref Range Status   Adenovirus NOT DETECTED NOT DETECTED Final   Coronavirus 229E NOT DETECTED NOT DETECTED Final   Coronavirus HKU1 NOT DETECTED NOT DETECTED Final   Coronavirus NL63 NOT DETECTED NOT DETECTED Final   Coronavirus OC43 NOT DETECTED NOT DETECTED Final   Metapneumovirus NOT DETECTED NOT DETECTED Final   Rhinovirus / Enterovirus NOT DETECTED NOT DETECTED Final   Influenza A NOT DETECTED NOT DETECTED Final   Influenza B NOT DETECTED NOT DETECTED Final   Parainfluenza Virus 1 NOT DETECTED NOT DETECTED  Final   Parainfluenza Virus 2 NOT DETECTED NOT DETECTED Final   Parainfluenza Virus 3 NOT DETECTED NOT DETECTED Final   Parainfluenza Virus 4 NOT DETECTED NOT DETECTED Final   Respiratory Syncytial Virus NOT DETECTED NOT DETECTED Final   Bordetella pertussis NOT DETECTED NOT DETECTED Final   Chlamydophila pneumoniae NOT DETECTED NOT DETECTED Final   Mycoplasma pneumoniae NOT DETECTED NOT DETECTED Final     Studies: Dg Chest 2 View  Result Date: 11/20/2017 CLINICAL DATA:  Swelling of the feet, abdomen and neck x5 days. EXAM: CHEST - 2 VIEW COMPARISON:  None. FINDINGS: Chronic eventration of the left hemidiaphragm with bibasilar left greater than right atelectasis. Borderline cardiomegaly with mild aortic atherosclerosis. Median sternotomy sutures are in place. Left anterior chest wall pacemaking apparatus is noted with leads projecting over the right heart. Aortic valvular replacement is noted. IMPRESSION: Chronic basilar atelectasis and/or scarring left greater than right. No active pulmonary disease. Chronic elevation/eventration of the left hemidiaphragm. Electronically Signed   By: Tollie Eth M.D.   On: 11/20/2017 22:52   Ct Head Wo Contrast  Result Date: 11/20/2017 CLINICAL DATA:  42 year old male with altered mental status. EXAM: CT HEAD WITHOUT CONTRAST TECHNIQUE: Contiguous axial images were obtained from the base of the skull through the vertex without intravenous contrast. COMPARISON:  None. FINDINGS: Brain: The ventricles and sulci appropriate in size for patient's age. The gray-white matter discrimination is preserved. Subcentimeter hypodense focus in the region of the right basal ganglia likely represents a dilated prevascular space and less likely an old lacunar infarct. There is no acute intracranial hemorrhage. No mass effect or midline shift. No extra-axial fluid collection. Vascular: No hyperdense vessel or unexpected calcification. Skull: No acute findings. Sinuses/Orbits: No  acute finding. Other: None IMPRESSION: Unremarkable noncontrast CT of the brain. Electronically Signed   By: Elgie Collard M.D.   On: 11/20/2017 23:52    Scheduled Meds: . acetaminophen  650 mg Oral Once  . aspirin EC  81 mg Oral Daily  . buPROPion  200 mg Oral BID  . furosemide  40 mg Oral Daily  . insulin aspart  0-15 Units Subcutaneous TID WC  . insulin aspart  0-5 Units Subcutaneous QHS  . ipratropium-albuterol  3 mL Nebulization QID  . losartan  25 mg Oral Daily  . OLANZapine  20 mg Oral QHS  . predniSONE  40 mg Oral Q breakfast  . warfarin  5 mg Oral Q T,Th,S,Su  . warfarin  7.5 mg Oral Q M,W,F  . Warfarin - Physician Dosing Inpatient   Does not apply [phone]  Assessment/Plan:  1. Acute hypoxic respiratory failure.  This has resolved and patient breathing on room air. 2. COPD exacerbation.  Patient given Solu-Medrol in the emergency room and now is on prednisone taper.  Still with poor air entry.  Continue nebulizer treatments. 3. Type 2 diabetes mellitus.   start Levemir 10 units daily.  Sliding scale insulin. 4. History of aortic valve replacement.  Therapeutic on Coumadin 5. Hyperlipidemia unspecified.  Discontinue Lipitor with altered mental status 6. History of sleep apnea on CPAP and oxygen at night.  Wife concerned that his CPAP is not working properly.  Get an overnight oximetry on CPAP here to see if he is desaturating on whether he needs BiPAP. 7. Acute encephalopathy.  Patient answering questions okay.  DC Lipitor with acute encephalopathy.  ABG does not show CO2 retention.  Potentially could have been caused by hypoxia on coming into the hospital.  Could also be secondary to Zyprexa we do not want to change that.  Code Status:     Code Status Orders  (From admission, onward)        Start     Ordered   11/21/17 0214  Full code  Continuous     11/21/17 0213    Code Status History    Date Active Date Inactive Code Status Order ID Comments User Context    12/01/2014 1515 12/08/2014 1905 Full Code [id number]  Rowe Clack, PA-C Inpatient   11/25/2014 1551 12/01/2014 1515 Full Code [id number]  Charlynne Pander, DDS Inpatient   11/25/2014 1010 11/25/2014 1551 Full Code [id number]  Marinus Maw, MD Inpatient   11/21/2014 1459 11/25/2014 1010 Full Code [id number]  Ok Anis, NP Inpatient   11/21/2014 0318 11/21/2014 1459 Full Code [id number]  Arnaldo Natal, MD Inpatient   10/04/2014 1008 10/07/2014 2033 Full Code [id number]  Jacinta Shoe Inpatient   10/04/2014 0914 10/04/2014 1008 Full Code [id number]  Marykay Lex, MD Inpatient   10/04/2014 0142 10/04/2014 0914 Full Code [id number]  Hower, Cletis Athens, MD ED      Disposition Plan: Hopefully home tomorrow.  Consultants:  Cardiology  Time spent: 28 minutes  [employer]

## 2017-11-22 NOTE — Care Management (Signed)
Attending relays patient to have overnight oximetry and ABG to determine if home cpap is meeting patient's needs.  Patient and girlfriend relay that patient has had current home cpap around one year. Last sleep 1-2 years ago.  Found results from Sleep Med evaluation performed 10/31/2016. have left a voicemail message for agency.   Patient and girlfriend maintain that "the cpap does not work right."  It was donated to patient from a dme company and has been serviced once. Does not know the name of the company. Spoke with cardiopulmonary regarding possibility of having patient's machine be brought to the hospital to determine if is working correctly and informed it would be a liability for the hospital. Discussed with attending that if patient continues to meet criteria for home cpap. In order to receive a new cpap, patient will require another outpatient sleep study as his last is over one year ago. Asked patient to have family bring cpap machine to unit so can attempt to find out agency that provided it.

## 2017-11-22 NOTE — Progress Notes (Signed)
Overnight pulse oximetry started on cpap with 2 lpm

## 2017-11-22 NOTE — Plan of Care (Signed)
  Problem: Education: Goal: Knowledge of General Education information will improve Outcome: Progressing   Problem: Health Behavior/Discharge Planning: Goal: Ability to manage health-related needs will improve Outcome: Progressing   Problem: Clinical Measurements: Goal: Ability to maintain clinical measurements within normal limits will improve Outcome: Progressing Goal: Will remain free from infection Outcome: Progressing   Problem: Activity: Goal: Risk for activity intolerance will decrease Outcome: Progressing Note:  Patient ambulated around the nurses station    Problem: Nutrition: Goal: Adequate nutrition will be maintained Outcome: Progressing   Problem: Safety: Goal: Ability to remain free from injury will improve Outcome: Progressing   Problem: Skin Integrity: Goal: Risk for impaired skin integrity will decrease Outcome: Progressing

## 2017-11-23 LAB — CBC
HEMATOCRIT: 39.2 % — AB (ref 40.0–52.0)
HEMOGLOBIN: 13.2 g/dL (ref 13.0–18.0)
MCH: 30.9 pg (ref 26.0–34.0)
MCHC: 33.8 g/dL (ref 32.0–36.0)
MCV: 91.5 fL (ref 80.0–100.0)
Platelets: 175 10*3/uL (ref 150–440)
RBC: 4.28 MIL/uL — ABNORMAL LOW (ref 4.40–5.90)
RDW: 13.9 % (ref 11.5–14.5)
WBC: 12.5 10*3/uL — ABNORMAL HIGH (ref 3.8–10.6)

## 2017-11-23 LAB — BASIC METABOLIC PANEL
ANION GAP: 9 (ref 5–15)
BUN: 20 mg/dL (ref 6–20)
CO2: 26 mmol/L (ref 22–32)
Calcium: 8.6 mg/dL — ABNORMAL LOW (ref 8.9–10.3)
Chloride: 105 mmol/L (ref 98–111)
Creatinine, Ser: 0.76 mg/dL (ref 0.61–1.24)
GFR calc Af Amer: >60 mL/min (ref >60)
GFR calc non Af Amer: >60 mL/min (ref >60)
GLUCOSE: 154 mg/dL — AB (ref 70–99)
POTASSIUM: 3.5 mmol/L (ref 3.5–5.1)
Sodium: 140 mmol/L (ref 135–145)

## 2017-11-23 LAB — PROTIME-INR
INR: 3.05
Prothrombin Time: 31.3 seconds — ABNORMAL HIGH (ref 11.4–15.2)

## 2017-11-23 LAB — MAGNESIUM: Magnesium: 2.1 mg/dL (ref 1.7–2.4)

## 2017-11-23 LAB — GLUCOSE, CAPILLARY
Glucose-Capillary: 140 mg/dL — ABNORMAL HIGH (ref 70–99)
Glucose-Capillary: 265 mg/dL — ABNORMAL HIGH (ref 70–99)

## 2017-11-23 MED ORDER — OLANZAPINE 2.5 MG PO TABS: 20.0000 mg | ORAL_TABLET | Freq: Every day | ORAL | Status: DC

## 2017-11-23 MED ORDER — WARFARIN SODIUM 5 MG PO TABS: 5.0000 mg | ORAL_TABLET | ORAL | Status: DC

## 2017-11-23 MED ORDER — PREDNISONE 10 MG PO TABS: ORAL_TABLET | ORAL | 0 refills | Status: DC

## 2017-11-23 MED ORDER — ALBUTEROL SULFATE HFA 108 (90 BASE) MCG/ACT IN AERS: 2.0000 | INHALATION_SPRAY | Freq: Four times a day (QID) | RESPIRATORY_TRACT | 0 refills | Status: DC | PRN

## 2017-11-23 MED ORDER — WARFARIN SODIUM 7.5 MG PO TABS: 7.5000 mg | ORAL_TABLET | ORAL | Status: DC

## 2017-11-23 MED ORDER — METFORMIN HCL 1000 MG PO TABS: 1000.0000 mg | ORAL_TABLET | Freq: Two times a day (BID) | ORAL | 0 refills | Status: DC

## 2017-11-23 NOTE — Plan of Care (Signed)
  Problem: Clinical Measurements: Goal: Diagnostic test results will improve Outcome: Progressing Note:  K 3.5 this am WBC 12.5   Problem: Pain Managment: Goal: General experience of comfort will improve Outcome: Progressing Note:  PRN medications   Problem: Clinical Measurements: Goal: Will remain free from infection Note:  Remains afebrile

## 2017-11-23 NOTE — Progress Notes (Signed)
Inpatient Diabetes Program Recommendations  AACE/ADA: New Consensus Statement on Inpatient Glycemic Control (2015)  Target Ranges:  Prepandial:   less than 140 mg/dL      Peak postprandial:   less than 180 mg/dL (1-2 hours)      Critically ill patients:  140 - 180 mg/dL   Results for MICHAELVINCENT, WILLE (MRN [id number]) as of 11/23/2017 10:18  Ref. Range 11/22/2017 07:48 11/22/2017 12:02 11/22/2017 16:34 11/22/2017 21:30  Glucose-Capillary Latest Ref Range: 70 - 99 mg/dL 789 (H) 381 (H) 017 (H) 239 (H)   Results for ELIKAI, ASTBURY (MRN [id number]) as of 11/23/2017 10:18  Ref. Range 11/23/2017 07:34  Glucose-Capillary Latest Ref Range: 70 - 99 mg/dL 782 (H)   Results for AMMON, FORESTIER (MRN [id number]) as of 11/23/2017 10:18  Ref. Range 11/21/2017 05:19  Hemoglobin A1C Latest Ref Range: 4.8 - 5.6 % 6.8 (H)     Home DM Meds: Metformin 500 mg BID  Current Insulin Orders: Levemir 10 units daily      Novolog Moderate Correction Scale/ SSI (0-15 units) TID AC + HS      Solumedrol 125 mg given at Midnight on 06/26.  Patient has been getting Prednisone 40 mg daily since AM of 06/26.  CBGs elevated likely due to the effects of the Prednisone.  CBGs well controlled at home--Current A1c= 6.8%.     --Will follow patient during hospitalization--  Ambrose Finland RN, MSN, CDE Diabetes Coordinator Inpatient Glycemic Control Team Team Pager: [phone] (8a-5p)

## 2017-11-23 NOTE — Plan of Care (Signed)
  Problem: Activity: Goal: Risk for activity intolerance will decrease Outcome: Progressing   Problem: Coping: Goal: Level of anxiety will decrease Outcome: Progressing   Problem: Pain Managment: Goal: General experience of comfort will improve Outcome: Progressing   Problem: Safety: Goal: Ability to remain free from injury will improve Outcome: Progressing   Problem: Skin Integrity: Goal: Risk for impaired skin integrity will decrease Outcome: Progressing   Problem: Education: Goal: Knowledge of General Education information will improve Outcome: Completed/Met   Problem: Nutrition: Goal: Adequate nutrition will be maintained Outcome: Completed/Met    No complaints of pain this shift, pt had over night pulse oximetry done while on CPAP.

## 2017-11-23 NOTE — Discharge Summary (Signed)
[facility] - [place] [facility]   PATIENT NAME: Nicholas Horton    MR#:  [id number]  DATE OF BIRTH:  03-16-1976  DATE OF ADMISSION:  11/20/2017 ADMITTING PHYSICIAN: Barbaraann Rondo, MD  DATE OF DISCHARGE: 11/23/2017  1:55 PM  PRIMARY CARE PHYSICIAN: Etheleen Nicks, NP    ADMISSION DIAGNOSIS:  Shortness of breath [R06.02]  DISCHARGE DIAGNOSIS:  Active Problems:   Acute hypoxemic respiratory failure (HCC)   SECONDARY DIAGNOSIS:   Past Medical History:  Diagnosis Date  . CHF (congestive heart failure) (HCC)   . Congenital bicuspid aortic valve 09/2014   - s/p AVR  . Nonischemic cardiomyopathy (HCC) -- Resolved[I42.9] 09/2014   EF by Echo 20-25% (pre-op AVR) --> Echo 10/2014 & 03/2015: EF 50-55% - also Recent Normal Diastolic parameters   . S/P AVR (aortic valve replacement) 10/2014   21 mm Carbometrics Mechanical Valve -- Epicardial PPM Leads  . Severe aortic stenosis 10/04/2014   Presented with Syncope & CHF  . Third degree heart block (HCC) 10/04/2014   Transient CHB related to AS -- s/p PPM    HOSPITAL COURSE:   1.  Acute hypoxic respiratory failure.  This has resolved and patient is breathing on room air during the day. 2.  COPD exacerbation.  The patient was given Solu-Medrol in the emergency room and switched over to prednisone.  The patient is moving better air and breathing easier.  Can go back on his inhaler at home.  Prescribe PRN albuterol inhaler.  Quick prednisone taper. 3.  Type 2 diabetes mellitus.  Hemoglobin A1c 6.8.  Increase metformin to thousand milligrams twice a day. 4.  History of aortic valve replacement on Coumadin 5.  Hyperlipidemia unspecified.  discontinue Lipitor with his altered mental status 6.  History of sleep apnea on CPAP and oxygen at night.  Spoke with our care Production designer, theatre/television/film.  He will need another sleep study in order for another order for CPAP to be ordered.  Refer him to Dr. Nicholos Johns on as outpatient for sleep study. 7.   Acute encephalopathy.  Patient is answering questions okay.  I discontinued Lipitor with his acute encephalopathy.  ABG normal and does not show CO2 retention.  This could have been caused by hypoxia on presentation to the hospital.  Could also be caused secondary to the Zyprexa but family does not want to lower the dose on this either.  DISCHARGE CONDITIONS:   Satisfactory  CONSULTS OBTAINED:  Treatment Team:  Barbaraann Rondo, MD  DRUG ALLERGIES:  No Known Allergies  DISCHARGE MEDICATIONS:   Allergies as of 11/23/2017   No Known Allergies     Medication List    STOP taking these medications   atorvastatin 20 MG tablet Commonly known as:  LIPITOR     TAKE these medications   albuterol 108 (90 Base) MCG/ACT inhaler Commonly known as:  PROVENTIL HFA;VENTOLIN HFA Inhale 2 puffs into the lungs every 6 (six) hours as needed for wheezing or shortness of breath.   amoxicillin 500 MG capsule Commonly known as:  AMOXIL Take 4 capsules (2,000 mg total) by mouth as needed. One hour prior to dental procedure   aspirin 81 MG EC tablet Take 1 tablet (81 mg total) by mouth daily.   BEVESPI AEROSPHERE IN Inhale 2 puffs into the lungs 2 (two) times daily.   furosemide 40 MG tablet Commonly known as:  LASIX Take one tablet by mouth daily and may take an extra one dose additional if needed   losartan 25  MG tablet Commonly known as:  COZAAR Take 1 tablet (25 mg total) by mouth daily.   metFORMIN 1000 MG tablet Commonly known as:  GLUCOPHAGE Take 1 tablet (1,000 mg total) by mouth 2 (two) times daily. What changed:    medication strength  how much to take   OLANZapine 2.5 MG tablet Commonly known as:  ZYPREXA Take 8 tablets (20 mg total) by mouth at bedtime.   predniSONE 10 MG tablet Commonly known as:  DELTASONE Three tabs po day 1; 2 tabs po day2; 1 tab po day3   warfarin 7.5 MG tablet Commonly known as:  COUMADIN Take as directed. If you are unsure how to take  this medication, talk to your nurse or doctor. Original instructions:  Take 1 tablet (7.5 mg total) by mouth every Monday, Wednesday, and Friday. What changed:    medication strength  how much to take  how to take this  when to take this  additional instructions   warfarin 5 MG tablet Commonly known as:  COUMADIN Take as directed. If you are unsure how to take this medication, talk to your nurse or doctor. Original instructions:  Take 1 tablet (5 mg total) by mouth every Tuesday, Thursday, Saturday, and Sunday. Start taking on:  11/24/2017 What changed:    how much to take  how to take this  when to take this  additional instructions   WELLBUTRIN PO Take 200 mg by mouth 2 (two) times daily.        DISCHARGE INSTRUCTIONS:    Follow-up PMD 1 week Follow-up pulmonary 2 weeks to set up sleep study  If you experience worsening of your admission symptoms, develop shortness of breath, life threatening emergency, suicidal or homicidal thoughts you must seek medical attention immediately by calling 911 or calling your MD immediately  if symptoms less severe.  You Must read complete instructions/literature along with all the possible adverse reactions/side effects for all the Medicines you take and that have been prescribed to you. Take any new Medicines after you have completely understood and accept all the possible adverse reactions/side effects.   Please note  You were cared for by a hospitalist during your hospital stay. If you have any questions about your discharge medications or the care you received while you were in the hospital after you are discharged, you can call the unit and asked to speak with the hospitalist on call if the hospitalist that took care of you is not available. Once you are discharged, your primary care physician will handle any further medical issues. Please note that NO REFILLS for any discharge medications will be authorized once you are  discharged, as it is imperative that you return to your primary care physician (or establish a relationship with a primary care physician if you do not have one) for your aftercare needs so that they can reassess your need for medications and monitor your lab values.    Today   CHIEF COMPLAINT:   Chief Complaint  Patient presents with  . Leg Swelling  . Weakness    HISTORY OF PRESENT ILLNESS:  Nicholas Horton  is a 42 y.o. male presented with leg swelling and weakness and found to have acute respiratory failure and COPD exacerbation   VITAL SIGNS:  Blood pressure 113/76, pulse 65, temperature (!) 97.4 F (36.3 C), temperature source Oral, resp. rate 16, height 6\' 2"  (1.88 m), weight 119.2 kg (262 lb 11.2 oz), SpO2 96 %.   PHYSICAL EXAMINATION:  GENERAL:  42 y.o.-year-old patient lying in the bed with no acute distress.  EYES: Pupils equal, round, reactive to light and accommodation. No scleral icterus. Extraocular muscles intact.  HEENT: Head atraumatic, normocephalic. Oropharynx and nasopharynx clear.  NECK:  Supple, no jugular venous distention. No thyroid enlargement, no tenderness.  LUNGS: Normal breath sounds bilaterally, no wheezing, rales,rhonchi or crepitation. No use of accessory muscles of respiration.  CARDIOVASCULAR: S1, S2 normal.  3 out of 6 systolic murmurs.  No rubs, or gallops.  ABDOMEN: Soft, non-tender, non-distended. Bowel sounds present. No organomegaly or mass.  EXTREMITIES: No pedal edema, cyanosis, or clubbing.  NEUROLOGIC: Cranial nerves II through XII are intact. Muscle strength 5/5 in all extremities. Sensation intact. Gait not checked.  PSYCHIATRIC: The patient is alert and oriented x 3.  SKIN: No obvious rash, lesion, or ulcer.   DATA REVIEW:   CBC Recent Labs  Lab 11/23/17 0403  WBC 12.5*  HGB 13.2  HCT 39.2*  PLT 175    Chemistries  Recent Labs  Lab 11/20/17 2330  11/23/17 0403  NA  --   --  140  K  --   --  3.5  CL  --   --  105   CO2  --   --  26  GLUCOSE  --   --  154*  BUN  --   --  20  CREATININE  --   --  0.76  CALCIUM  --   --  8.6*  MG  --    < > 2.1  AST 42*  --   --   ALT 42  --   --   ALKPHOS 60  --   --   BILITOT 0.8  --   --    < > = values in this interval not displayed.    Cardiac Enzymes Recent Labs  Lab 11/21/17 0519  TROPONINI <0.03    Microbiology Results  Results for orders placed or performed during the hospital encounter of 11/20/17  Respiratory Panel by PCR     Status: None   Collection Time: 11/21/17  8:42 AM  Result Value Ref Range Status   Adenovirus NOT DETECTED NOT DETECTED Final   Coronavirus 229E NOT DETECTED NOT DETECTED Final   Coronavirus HKU1 NOT DETECTED NOT DETECTED Final   Coronavirus NL63 NOT DETECTED NOT DETECTED Final   Coronavirus OC43 NOT DETECTED NOT DETECTED Final   Metapneumovirus NOT DETECTED NOT DETECTED Final   Rhinovirus / Enterovirus NOT DETECTED NOT DETECTED Final   Influenza A NOT DETECTED NOT DETECTED Final   Influenza B NOT DETECTED NOT DETECTED Final   Parainfluenza Virus 1 NOT DETECTED NOT DETECTED Final   Parainfluenza Virus 2 NOT DETECTED NOT DETECTED Final   Parainfluenza Virus 3 NOT DETECTED NOT DETECTED Final   Parainfluenza Virus 4 NOT DETECTED NOT DETECTED Final   Respiratory Syncytial Virus NOT DETECTED NOT DETECTED Final   Bordetella pertussis NOT DETECTED NOT DETECTED Final   Chlamydophila pneumoniae NOT DETECTED NOT DETECTED Final   Mycoplasma pneumoniae NOT DETECTED NOT DETECTED Final       Management plans discussed with the patient, and he is in agreement.  CODE STATUS:     Code Status Orders  (From admission, onward)        Start     Ordered   11/21/17 0214  Full code  Continuous     11/21/17 0213    Code Status History    Date Active Date Inactive Code Status Order  ID Comments User Context   12/01/2014 1515 12/08/2014 1905 Full Code [id number]  Renee Rival Inpatient   11/25/2014 1551 12/01/2014 1515 Full  Code [id number]  Charlynne Pander, DDS Inpatient   11/25/2014 1010 11/25/2014 1551 Full Code [id number]  Marinus Maw, MD Inpatient   11/21/2014 1459 11/25/2014 1010 Full Code [id number]  Ok Anis, NP Inpatient   11/21/2014 0318 11/21/2014 1459 Full Code [id number]  Arnaldo Natal, MD Inpatient   10/04/2014 1008 10/07/2014 2033 Full Code [id number]  Jacinta Shoe Inpatient   10/04/2014 0914 10/04/2014 1008 Full Code [id number]  Marykay Lex, MD Inpatient   10/04/2014 0142 10/04/2014 0914 Full Code [id number]  Hower, Cletis [place], MD ED      TOTAL TIME TAKING CARE OF THIS PATIENT: 35 minutes.    Alford Highland M.D on 11/23/2017 at 2:22 PM  Between 7am to 6pm - Pager - [phone]  After 6pm go to [email or web address] - password [email or web address]  [facility] Office  [phone]  CC: Primary care physician; Etheleen Nicks, NP

## 2017-11-23 NOTE — Progress Notes (Signed)
Pt discharged to home via wc.  Instructions  given to pt.  Questions answered.  No distress.  

## 2017-11-23 NOTE — Care Management (Signed)
[facility] provided patient's cpap and phone number is [phone].  This number is on patient's machine.  Agency stated to have patient contact them to schedule time for respiratory therapist to make home visit.  Patient understands that will require another sleep study in order to obtain a new cpap machine.  He now has [employer] and [facility]. Discussed that his pulmonologist could schedule this.  No other discharge needs

## 2017-11-27 DIAGNOSIS — I509 Heart failure, unspecified: Secondary | ICD-10-CM | POA: Diagnosis not present

## 2017-11-27 DIAGNOSIS — R0602 Shortness of breath: Secondary | ICD-10-CM | POA: Diagnosis not present

## 2017-11-27 DIAGNOSIS — G4730 Sleep apnea, unspecified: Secondary | ICD-10-CM | POA: Diagnosis not present

## 2017-11-28 ENCOUNTER — Ambulatory Visit (INDEPENDENT_AMBULATORY_CARE_PROVIDER_SITE_OTHER): Payer: Medicare HMO

## 2017-11-28 DIAGNOSIS — R69 Illness, unspecified: Secondary | ICD-10-CM | POA: Diagnosis not present

## 2017-11-28 DIAGNOSIS — Z952 Presence of prosthetic heart valve: Secondary | ICD-10-CM | POA: Diagnosis not present

## 2017-11-28 DIAGNOSIS — Z5181 Encounter for therapeutic drug level monitoring: Secondary | ICD-10-CM | POA: Diagnosis not present

## 2017-11-28 LAB — POCT INR: INR: 2.1 (ref 2.0–3.0)

## 2017-11-28 NOTE — Patient Instructions (Signed)
Please continue dosage of 1 tablet daily except 1.5 on Sundays, Tuesdays, Thursdays. Recheck in 4 weeks.  

## 2017-12-04 ENCOUNTER — Ambulatory Visit (INDEPENDENT_AMBULATORY_CARE_PROVIDER_SITE_OTHER): Payer: Medicare HMO | Admitting: *Deleted

## 2017-12-04 DIAGNOSIS — I442 Atrioventricular block, complete: Secondary | ICD-10-CM

## 2017-12-04 NOTE — Progress Notes (Signed)
Remote pacemaker transmission.   

## 2017-12-05 DIAGNOSIS — R69 Illness, unspecified: Secondary | ICD-10-CM | POA: Diagnosis not present

## 2017-12-05 LAB — HM DIABETES EYE EXAM

## 2017-12-05 NOTE — Progress Notes (Signed)
[facility]     Assessment and Plan:  Excessive daytime sleepiness.  -Symptoms and signs of short of sleep apnea, patient also has a inadequate sleep, and a sleeping for much of the day. -We'll send for sleep study, in addition, instructed the patient on reducing sleep during the day.  Insomnia, poor sleep hygiene.  -Patient is going to bed very late, and waking up late. -We discussed ways to try to improve his sleep hygiene, detailed in patient instructions.  Asthma with recent hospitalization. -Patient follows with his pulmonologist at [facility]. -Continue Bevespi.  Return in about 1 year (around 12/08/2018).    Date: 12/05/2017  MRN# [id number] Nicholas Horton 1975/12/02    Nicholas Horton is a 42 y.o. old male seen in consultation for chief complaint of:    Chief Complaint  Patient presents with  . Hospitalization Follow-up    pt c/o sob with exertion,chest tightness, wheezing occasional. He denies cough. Pt on 2 liters 02 qhs.  . Sleep Apnea    pt had sleep study 09/2016 but due to lack of ins was not able to get bi-pap.    HPI:  The patient is a 42 yo male last seen with possible OSA, for which he was sent for a sleep study. He was also noted to have possible asthma, with dyspnea on exertion, he was started on breo. Since then he was admitted to the hospital from 6/25 to 11/23/17 with AEasthma.  He was sent for sleep study which showed an AHI of 17, he was recommended to be on auto BiPAP, he is currently on PAP, for the past 6 months. This was obtained through [facility] charity care, [employer] [facility] was the suppier.   Currently he feels that his breathing is doing well, back to normal. He uses bevespi 2 puffs twice daily, ventolin rarely, not in the past week.  He has no pets at home. Denies reflux, sinus drainage.   He has been having trouble falling asleep. He goes to bed at varied times; usually from 12-2am. He wakes at 645 am to drop his child at school. He gets  back home at 8, goes back to bed and wakes at noon or later.  He then goes to get her at 2:30pm and then he will be up for the day.    **Split-night sleep study 10/24/2016>> AHI 17 , recommend to be on BiPAP auto, low expiratory pressure 8, maximum sori pressure of 15, pressure support of 5.   Social Hx:   Social History   Tobacco Use  . Smoking status: Former Smoker    Packs/day: 2.00    Years: 20.00    Pack years: 40.00    Types: Cigarettes  . Smokeless tobacco: Never Used  . Tobacco comment: quit 10/04/14  Substance Use Topics  . Alcohol use: No    Alcohol/week: 0.0 oz    Comment: rarely  . Drug use: No   Medication:   Reviewed.    Allergies:  Patient has no known allergies.  Review of Systems:  Constitutional: Feels well. Cardiovascular: No chest pain.  Pulmonary: Denies dyspnea.   The remainder of systems were reviewed and were found to be negative other than what is documented in the HPI.    Physical Examination:   VS: BP 132/86 (BP Location: Left Arm, Cuff Size: Large)   Pulse 71   Resp 16   Ht 6\' 2"  (1.88 m)   Wt 260 lb (117.9 kg)   SpO2 94%  BMI 33.38 kg/m   General Appearance: No distress  Neuro:without focal findings, mental status, speech normal, alert and oriented HEENT: PERRLA, EOM intact Pulmonary: No wheezing, No rales  CardiovascularNormal S1,S2.  No m/r/g.  Abdomen: Benign, Soft, non-tender, No masses Renal:  No costovertebral tenderness  GU:  No performed at this time. Endoc: No evident thyromegaly, no signs of acromegaly or Cushing features Skin:   warm, no rashes, no ecchymosis  Extremities: normal, no cyanosis, clubbing.       LABORATORY PANEL:   CBC No results for input(s): WBC, HGB, HCT, PLT in the last 168 hours. ------------------------------------------------------------------------------------------------------------------  Chemistries  No results for input(s): NA, K, CL, CO2, GLUCOSE, BUN, CREATININE, CALCIUM, MG, AST,  ALT, ALKPHOS, BILITOT in the last 168 hours.  Invalid input(s): GFRCGP ------------------------------------------------------------------------------------------------------------------  Cardiac Enzymes No results for input(s): TROPONINI in the last 168 hours. ------------------------------------------------------------  RADIOLOGY:  No results found.     Thank  you for the consultation and for allowing [facility] to assist in the care of your patient. Our recommendations are noted above.  Please contact us if we can be of further service.  Wells Guiles, M.D., F.C.C.P.  Board Certified in Internal Medicine, Pulmonary Medicine, Critical Care Medicine, and Sleep Medicine.  [facility] Office Number: [phone]  12/05/2017

## 2017-12-07 ENCOUNTER — Encounter: Payer: Self-pay | Admitting: Internal Medicine

## 2017-12-07 ENCOUNTER — Ambulatory Visit (INDEPENDENT_AMBULATORY_CARE_PROVIDER_SITE_OTHER): Payer: Medicare HMO | Admitting: Internal Medicine

## 2017-12-07 ENCOUNTER — Telehealth: Payer: Self-pay | Admitting: Internal Medicine

## 2017-12-07 VITALS — BP 132/86 | HR 71 | Resp 16 | Ht 74.0 in | Wt 260.0 lb

## 2017-12-07 DIAGNOSIS — G4733 Obstructive sleep apnea (adult) (pediatric): Secondary | ICD-10-CM

## 2017-12-07 DIAGNOSIS — J4540 Moderate persistent asthma, uncomplicated: Secondary | ICD-10-CM | POA: Diagnosis not present

## 2017-12-07 DIAGNOSIS — F5101 Primary insomnia: Secondary | ICD-10-CM | POA: Diagnosis not present

## 2017-12-07 DIAGNOSIS — R69 Illness, unspecified: Secondary | ICD-10-CM | POA: Diagnosis not present

## 2017-12-07 NOTE — Telephone Encounter (Signed)
Called [name] back informed we have requested patient be put in [place] so that Dr. Ardyth Man can decide which way therapy needs to go. Nothing further needed at this time.  [phone]

## 2017-12-07 NOTE — Telephone Encounter (Addendum)
Called [facility] again and left message with Colin Mulders, she will send message to DME side and have them send recent download.

## 2017-12-07 NOTE — Telephone Encounter (Signed)
Pt friend, [name], called, states pt forgot to tell Dr. Ardyth Man "a lot of things this morning". States he needs a sleep study and forgot to tell he has been in the hospital at [facility], and his sleep machine is from [facility]

## 2017-12-07 NOTE — Patient Instructions (Addendum)
Try to avoid day time naps. Continue to try to use PAP nightly for the whole night.   You can follow good "sleep hygiene." That means that you: ?Sleep only long enough to feel rested and then get out of bed ?Go to bed and get up at the same time every day ?Do not try to force yourself to sleep. If you can't sleep, get out of bed and try again later. ?Have coffee, tea, and other foods that have caffeine only in the morning ?Avoid alcohol in the late afternoon, evening, and bedtime ?Avoid smoking, especially in the evening ?Keep your bedroom dark, cool, quiet, and free of reminders of work or other things that cause you stress ?Solve problems you have before you go to bed ?Exercise several days a week, but not right before bed ?Avoid looking at phones or reading devices ("e-books") that give off light before bed. This can make it harder to fall asleep. Other things that can improve sleep include: ?Relaxation therapy, in which you focus on relaxing all the muscles in your body 1 by 1 ?Working with a Conservator, museum/gallery to deal with the problems that might be causing poor sleep

## 2017-12-07 NOTE — Addendum Note (Signed)
Addended by: Janean Sark on: 12/07/2017 03:05 PM   Modules accepted: Orders

## 2017-12-15 DIAGNOSIS — R69 Illness, unspecified: Secondary | ICD-10-CM | POA: Diagnosis not present

## 2017-12-18 ENCOUNTER — Encounter: Payer: Self-pay | Admitting: Family Medicine

## 2017-12-18 ENCOUNTER — Ambulatory Visit: Payer: Self-pay | Admitting: Nurse Practitioner

## 2017-12-18 ENCOUNTER — Ambulatory Visit (INDEPENDENT_AMBULATORY_CARE_PROVIDER_SITE_OTHER): Payer: Medicare HMO | Admitting: Family Medicine

## 2017-12-18 VITALS — BP 140/84 | HR 92 | Temp 98.5°F | Resp 16 | Ht 74.0 in | Wt 264.0 lb

## 2017-12-18 DIAGNOSIS — I5032 Chronic diastolic (congestive) heart failure: Secondary | ICD-10-CM | POA: Diagnosis not present

## 2017-12-18 DIAGNOSIS — R69 Illness, unspecified: Secondary | ICD-10-CM | POA: Diagnosis not present

## 2017-12-18 DIAGNOSIS — E669 Obesity, unspecified: Secondary | ICD-10-CM

## 2017-12-18 DIAGNOSIS — Z7689 Persons encountering health services in other specified circumstances: Secondary | ICD-10-CM | POA: Diagnosis not present

## 2017-12-18 DIAGNOSIS — J4540 Moderate persistent asthma, uncomplicated: Secondary | ICD-10-CM

## 2017-12-18 DIAGNOSIS — I442 Atrioventricular block, complete: Secondary | ICD-10-CM

## 2017-12-18 DIAGNOSIS — E1169 Type 2 diabetes mellitus with other specified complication: Secondary | ICD-10-CM

## 2017-12-18 DIAGNOSIS — Z952 Presence of prosthetic heart valve: Secondary | ICD-10-CM | POA: Diagnosis not present

## 2017-12-18 DIAGNOSIS — F250 Schizoaffective disorder, bipolar type: Secondary | ICD-10-CM

## 2017-12-18 DIAGNOSIS — F331 Major depressive disorder, recurrent, moderate: Secondary | ICD-10-CM

## 2017-12-18 MED ORDER — LOSARTAN POTASSIUM 25 MG PO TABS: 25.0000 mg | ORAL_TABLET | Freq: Every day | ORAL | 3 refills | Status: DC

## 2017-12-18 MED ORDER — METFORMIN HCL 1000 MG PO TABS: 1000.0000 mg | ORAL_TABLET | Freq: Two times a day (BID) | ORAL | 3 refills | Status: DC

## 2017-12-18 NOTE — Patient Instructions (Addendum)
Thank you for coming to the office today.  Sent rx for Metformin and Losartan for 90 day supply - continue these medicines  Call or send message if you need a refill on other medicines, let us know.  We will request record from Dr Alvester Morin and also from [facility]  Please schedule a Follow-up Appointment to: Return in about 2 months (around 02/18/2018) for DM A1c, foot exam, med adjust.  If you have any other questions or concerns, please feel free to call the office or send a message through MyChart. You may also schedule an earlier appointment if necessary.  Additionally, you may be receiving a survey about your experience at our office within a few days to 1 week by e-mail or mail. We value your feedback.  Saralyn Pilar, DO [facility], [place]

## 2017-12-18 NOTE — Progress Notes (Signed)
Subjective:    Patient ID: Nicholas Horton, male    DOB: 1975-11-11, 42 y.o.   MRN: [id number]  Nicholas Horton is a 42 y.o. male presenting on 12/18/2017 for Establish Care; Asthma; Sleep Apnea; and Diabetes  [name] Olevia Bowens, girlfriend  HPI   Low Testosterone Followed by Urology for Testosterone management. He was cleared by cardiology to proceed with testosterone.  CHRONIC DM, Type 2: Reports recent diagnosis within past >1 month, after taking psych meds Zyprexa eventually developed wt gain and hyperglycemia CBGs: Avg 100s Meds: Metformin 1047m BID, never on other meds Reports  good compliance. Tolerating well w/o side-effects Currently on ARB [facility] will need to request report Denies hypoglycemia, polyuria, visual changes, numbness or tingling.  Moderate to Severe Bicuspid AS > S/p AVR on anticoagluation, Complete Heart Block s/p bi-ventricular pacer [facility] with Dr HEllyn Hackand Dr KCaryl Comesrespectively [facility] anticoagulated on warfarin, managed by cards but rx warfarin per PCP - Has had yearly ECHO, prior preserved EF 55-60%, some mild diastolic dysfunction. On ARB and Lasix for diuresis. - Dyspnea thought to be more OSA/COPD/Asthma, less likely cardiac - Will be due for pacer battery replacement in near future - Background history on MI in 2016, ultimately found to have bicuspid AV, let to replacement, he had complete heart block as a result of surgery and also phrenic nerve injury  Schizoaffective Disorder, Bipolar Type / Intermittent Explosive Disorder / Reactive Attachment Disorder [facility] - on Zyprexa, Buproprion, and most recently Sertraline with improvement in mood and function  Suspected Sleep Apnea vs Excessive Daytime Sleepiness / Insomnia [facility] Last PSG 09/2016. Future sleep study has been  ordered. He has history of carbon dioxide retention by report and they were considering switch from CPAP to BiPAP.  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 Review outside record. Rarely uses Albuterol PRN. Has incentive spirometer.  Additional Social History - Lives at home with girlfriend AAraceli Bouche they have small child at home, he has been on Disability with mental and physical since April 2019  Depression screen PHQ 2/9 12/19/2017  Decreased Interest 2  Down, Depressed, Hopeless 2  PHQ - 2 Score 4  Altered sleeping 3  Tired, decreased energy 3  Change in appetite 2  Feeling bad or failure about yourself  2  Trouble concentrating 3  Moving slowly or fidgety/restless 2  Suicidal thoughts 1  PHQ-9 Score 20  Difficult doing work/chores Somewhat difficult   [place]-Suicide Severity Rating Scale 1) Have you wished you were dead or wished you could go to sleep and not wake up? - Yes  2) Have you had any actual thoughts of killing yourself? - No  Skip questions 3,4, 5  6) Have you ever done anything, started to do anything, or prepared to do anything to end your life? - No  GAD 7 : Generalized Anxiety Score 12/19/2017  Nervous, Anxious, on Edge 2  Control/stop worrying 2  Worry too much - different things 2  Trouble relaxing 2  Restless 1  Easily annoyed or irritable 2  Afraid - awful might happen 2  Total GAD 7 Score 13  Anxiety Difficulty Very difficult     Past Medical History:  Diagnosis Date  . CHF (congestive heart failure) ([place]   . Congenital bicuspid aortic  valve 09/2014   - s/p AVR  . Nonischemic cardiomyopathy ([place]) -- Resolved[I42.9] 09/2014   EF by Echo 20-25% (pre-op AVR) --> Echo 10/2014 & 03/2015: EF 50-55% - also Recent Normal Diastolic parameters   . S/P AVR (aortic valve replacement) 10/2014   21 mm Carbometrics Mechanical Valve -- Epicardial PPM Leads  . Severe aortic  stenosis 10/04/2014   Presented with Syncope & CHF   Past Surgical History:  Procedure Laterality Date  . AORTIC VALVE REPLACEMENT N/A 12/01/2014   Procedure: AORTIC VALVE REPLACEMENT (AVR);  Surgeon: Ivin Poot, MD;  Location: [place];  Service: Open Heart Surgery;  Laterality: N/A;  . CARDIAC CATHETERIZATION N/A 10/04/2014   Procedure: Left Heart Cath and Coronary Angiography;  Surgeon: Leonie Man, MD;  Location: [place] [facility];  Service: Cardiovascular;  Laterality: N/A;  . CARDIAC CATHETERIZATION  10/04/2014   Procedure: Temporary Pacemaker;  Surgeon: Leonie Man, MD;  Location: [place] [facility];  Service: Cardiovascular;;  . CARDIAC CATHETERIZATION N/A 11/25/2014   Procedure: Temporary Pacemaker;  Surgeon: Evans Lance, MD;  Location: [place] [facility];  Service: Cardiovascular;  Laterality: N/A;  . MULTIPLE EXTRACTIONS WITH ALVEOLOPLASTY N/A 11/25/2014   Procedure: Extraction of tooth #'s 1,2,3,4,5,6,7,9, 10, 11, 12,13,14,16, 19, 20, 21, 22, 23, 24, 25, 26, 27, 28, 29, 30, 31 with alveoloplasty;  Surgeon: Lenn Cal, DDS;  Location: [place];  Service: Oral Surgery;  Laterality: N/A;  . SURGERY SCROTAL / TESTICULAR     for undescended testicle as a child  . TEE WITHOUT CARDIOVERSION N/A 12/01/2014   Procedure: TRANSESOPHAGEAL ECHOCARDIOGRAM (TEE);  Surgeon: Ivin Poot, MD;  Location: [place];  Service: Open Heart Surgery;  Laterality: N/A;  . TRANSTHORACIC ECHOCARDIOGRAM  03/2015   Mildly dilated LV. EF 50 at 55%. Normal diastolic parameters. Mild to moderate stenosis of prosthetic aortic valve. Mild to moderate regurgitation   Social History   Socioeconomic History  . Marital status: Single    Spouse name: Not on file  . Number of children: Not on file  . Years of education: Not on file  . Highest education level: Not on file  Occupational History  . 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  Social Needs  . Financial resource strain: Not on file  . Food  insecurity:    Worry: Not on file    Inability: Not on file  . Transportation needs:    Medical: Not on file    Non-medical: Not on file  Tobacco Use  . Smoking status: Former Smoker    Packs/day: 2.00    Years: 20.00    Pack years: 40.00    Types: Cigarettes  . Smokeless tobacco: Never Used  . Tobacco comment: quit 10/04/14  Substance and Sexual Activity  . Alcohol use: No    Alcohol/week: 0.0 oz    Comment: rarely  . Drug use: No  . Sexual activity: Yes  Lifestyle  . Physical activity:    Days per week: Not on file    Minutes per session: Not on file  . Stress: Not on file  Relationships  . Social connections:    Talks on phone: Not on file    Gets together: Not on file    Attends religious service: Not on file    Active member of club or organization: Not on file    Attends meetings of clubs or organizations: Not on file    Relationship status: Not on file  .  Intimate partner violence:    Fear of current or ex partner: Not on file    Emotionally abused: Not on file    Physically abused: Not on file    Forced sexual activity: Not on file  Other Topics Concern  . Not on file  Social History Narrative   Lives in [place], [place] with wife and daughter.    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    Family History  Problem Relation Age of Onset  . CAD Neg Hx   . Hypertension Neg Hx    Current Outpatient Medications on File Prior to Visit  Medication Sig  . albuterol (PROVENTIL HFA;VENTOLIN HFA) 108 (90 Base) MCG/ACT inhaler Inhale 2 puffs into the lungs every 6 (six) hours as needed for wheezing or shortness of breath.  Marland Kitchen aspirin EC 81 MG EC tablet Take 1 tablet (81 mg total) by mouth daily.  . Blood Glucose Monitoring Suppl (ONE TOUCH ULTRA 2) w/Device KIT See admin instructions.  . BuPROPion HCl (WELLBUTRIN PO) Take 200 mg by mouth 2 (two) times daily.   Marland Kitchen dicyclomine (BENTYL) 10 MG capsule Take 10 mg by mouth 4 (four) times daily as  needed.  . furosemide (LASIX) 40 MG tablet Take one tablet by mouth daily and may take an extra one dose additional if needed  . Glycopyrrolate-Formoterol (BEVESPI AEROSPHERE IN) Inhale 2 puffs into the lungs 2 (two) times daily.  Marland Kitchen OLANZapine (ZYPREXA) 2.5 MG tablet Take 8 tablets (20 mg total) by mouth at bedtime.  Marland Kitchen warfarin (COUMADIN) 5 MG tablet Take 1 tablet (5 mg total) by mouth every Tuesday, Thursday, Saturday, and Sunday.  . warfarin (COUMADIN) 7.5 MG tablet Take 1 tablet (7.5 mg total) by mouth every Monday, Wednesday, and Friday.  . sertraline (ZOLOFT) 50 MG tablet Take 50 mg by mouth every morning.   No current facility-administered medications on file prior to visit.     Review of Systems Per HPI unless specifically indicated above     Objective:    BP 140/84   Pulse 92   Temp 98.5 F (36.9 C) (Oral)   Resp 16   Ht '6\' 2"'  (1.88 m)   Wt 264 lb (119.7 kg)   BMI 33.90 kg/m   Wt Readings from Last 3 Encounters:  12/18/17 264 lb (119.7 kg)  12/07/17 260 lb (117.9 kg)  11/23/17 262 lb 11.2 oz (119.2 kg)    Physical Exam  Constitutional: He is oriented to person, place, and time. He appears well-developed and well-nourished. No distress.  Currently well appearing, comfortable, cooperative, obese  HENT:  Head: Normocephalic and atraumatic.  Mouth/Throat: Oropharynx is clear and moist.  Eyes: Conjunctivae are normal. Right eye exhibits no discharge. Left eye exhibits no discharge.  Neck: Normal range of motion. Neck supple. No thyromegaly present.  Cardiovascular: Normal rate, regular rhythm and intact distal pulses.  Murmur (2/6 systolic murmur) heard. Mechanical S2  Pulmonary/Chest: Breath sounds normal. No respiratory distress. He has no wheezes. He has no rales.  Baseline seems to have reduced respiratory effort due to history of diaphragm problem, slightly reduced air movement, otherwise no abnormal breath sounds.  Musculoskeletal: Normal range of motion. He  exhibits no edema.  Lymphadenopathy:    He has no cervical adenopathy.  Neurological: He is alert and oriented to person, place, and time.  Skin: Skin is warm and dry. No rash noted. He is not diaphoretic. No erythema.  Psychiatric: He has a normal mood and affect.  His behavior is normal.  Well groomed, good eye contact. He has somewhat slowed speech and blunted affect, but his insight into health and mental health is mostly intact.  Nursing note and vitals reviewed.     Assessment & Plan:   Problem List Items Addressed This Visit    Chronic diastolic (congestive) heart failure (HCC) Stable and euvolemic Monitored by ECHO per Cardiology    Relevant Medications   losartan (COZAAR) 25 MG tablet   Complete heart block (HCC) Secondary to AVR, has S/p pacer Followed by EP    Relevant Medications   losartan (COZAAR) 25 MG tablet   Major depressive disorder, recurrent ([place]) See A&P for Schizoaffective On med management per [facility] Psychiatry and therapy See abnormal PHQ still, seems at baseline    Relevant Medications   sertraline (ZOLOFT) 50 MG tablet   Moderate persistent asthma Chronic problem, reportedly related to damaged phrenic nerve back from AVR Related to obesity and asthma with poor lung function, previously followed by Saddie Benders now locally [place] Dr Juanell Fairly Follow-up future resp function    Obesity (BMI 30.0-34.9) Improve lifestyle diet and exercise Goal wt loss    S/P AVR (Chronic) On anticoagulation Followed by Cardiology    Schizoaffective disorder ([place]) Followed by Psychiatry [facility] Also MDD, among other mental health disorders Will review record Continue current regimen as prescribed by Psychiatry    Type 2 diabetes mellitus with other specified complication ([place]) - Primary Last A1c controlled 6.8, relatively new diagnosis Concern prior hyperglycemia. No episodes of hypoglycemia at this time Continue Metformin 1056m BID Review updated DM  eye exam Next visit 2 months for A1c foot exam and review med management    Relevant Medications   metFORMIN (GLUCOPHAGE) 1000 MG tablet   losartan (COZAAR) 25 MG tablet    Other Visit Diagnoses    Encounter to establish care with new doctor     [facility]ordered this encounter  Medications  . metFORMIN (GLUCOPHAGE) 1000 MG tablet    Sig: Take 1 tablet (1,000 mg total) by mouth 2 (two) times daily.    Dispense:  90 tablet    Refill:  3    Request child safety caps  . losartan (COZAAR) 25 MG tablet    Sig: Take 1 tablet (25 mg total) by mouth daily.    Dispense:  90 tablet    Refill:  3    Request child safety cap    Follow up plan: Return in about 2 months (around 02/18/2018) for DM A1c, foot exam, med adjust.  ANobie Putnam DO [facility] 12/19/2017, 1:44 AM

## 2017-12-19 ENCOUNTER — Encounter: Payer: Self-pay | Admitting: Family Medicine

## 2017-12-19 LAB — CUP PACEART REMOTE DEVICE CHECK
Brady Statistic AP VP Percent: 2.14 %
Brady Statistic AP VS Percent: 0.01 %
Brady Statistic AS VP Percent: 97.78 %
Brady Statistic RA Percent Paced: 2.14 %
Brady Statistic RV Percent Paced: 99.85 %
Implantable Lead Implant Date: 20160705
Implantable Lead Location: 753858
Implantable Lead Location: 753859
Implantable Lead Location: 753860
Implantable Lead Model: 5071
Implantable Pulse Generator Implant Date: 20160705
Lead Channel Impedance Value: 4047 Ohm
Lead Channel Impedance Value: 4047 Ohm
Lead Channel Impedance Value: 4047 Ohm
Lead Channel Impedance Value: 4047 Ohm
Lead Channel Pacing Threshold Amplitude: 0.875 V
Lead Channel Pacing Threshold Pulse Width: 0.4 ms
Lead Channel Pacing Threshold Pulse Width: 1 ms
Lead Channel Sensing Intrinsic Amplitude: 12.25 mV
Lead Channel Sensing Intrinsic Amplitude: 4.5 mV
Lead Channel Setting Pacing Amplitude: 1.75 V
Lead Channel Setting Pacing Amplitude: 2.5 V
Lead Channel Setting Pacing Amplitude: 4 V
Lead Channel Setting Pacing Pulse Width: 1 ms
Lead Channel Setting Pacing Pulse Width: 1 ms
MDC IDC LEAD IMPLANT DT: 20160705
MDC IDC LEAD IMPLANT DT: 20160705
MDC IDC MSMT BATTERY REMAINING LONGEVITY: 19 mo
MDC IDC MSMT BATTERY VOLTAGE: 2.92 V
MDC IDC MSMT LEADCHNL LV IMPEDANCE VALUE: 361 Ohm
MDC IDC MSMT LEADCHNL LV IMPEDANCE VALUE: 4047 Ohm
MDC IDC MSMT LEADCHNL LV PACING THRESHOLD AMPLITUDE: 2.125 V
MDC IDC MSMT LEADCHNL RA IMPEDANCE VALUE: 570 Ohm
MDC IDC MSMT LEADCHNL RA IMPEDANCE VALUE: 817 Ohm
MDC IDC MSMT LEADCHNL RA SENSING INTR AMPL: 4.5 mV
MDC IDC MSMT LEADCHNL RV IMPEDANCE VALUE: 513 Ohm
MDC IDC MSMT LEADCHNL RV PACING THRESHOLD AMPLITUDE: 2.5 V
MDC IDC MSMT LEADCHNL RV PACING THRESHOLD PULSEWIDTH: 0.4 ms
MDC IDC MSMT LEADCHNL RV SENSING INTR AMPL: 12.25 mV
MDC IDC SESS DTM: 20190709022848
MDC IDC SET LEADCHNL RV SENSING SENSITIVITY: 4 mV
MDC IDC STAT BRADY AS VS PERCENT: 0.07 %

## 2017-12-24 ENCOUNTER — Encounter: Payer: Self-pay | Admitting: Family Medicine

## 2017-12-24 DIAGNOSIS — F250 Schizoaffective disorder, bipolar type: Secondary | ICD-10-CM

## 2017-12-24 MED ORDER — BUPROPION HCL ER (SR) 200 MG PO TB12: 200.0000 mg | ORAL_TABLET | Freq: Two times a day (BID) | ORAL | 0 refills | Status: DC

## 2017-12-29 DIAGNOSIS — R69 Illness, unspecified: Secondary | ICD-10-CM | POA: Diagnosis not present

## 2017-12-31 ENCOUNTER — Ambulatory Visit (INDEPENDENT_AMBULATORY_CARE_PROVIDER_SITE_OTHER): Payer: Medicare HMO

## 2017-12-31 DIAGNOSIS — Z952 Presence of prosthetic heart valve: Secondary | ICD-10-CM | POA: Diagnosis not present

## 2017-12-31 DIAGNOSIS — Z5181 Encounter for therapeutic drug level monitoring: Secondary | ICD-10-CM

## 2017-12-31 LAB — POCT INR: INR: 1.7 — AB (ref 2.0–3.0)

## 2017-12-31 NOTE — Patient Instructions (Signed)
Please take 2 tablets of coumadin tonight, then continue dosage of 1 tablet daily except 1.5 on Sundays, Tuesdays, Thursdays. Recheck in 3 weeks.

## 2018-01-23 ENCOUNTER — Ambulatory Visit (INDEPENDENT_AMBULATORY_CARE_PROVIDER_SITE_OTHER): Payer: Medicare HMO

## 2018-01-23 DIAGNOSIS — Z952 Presence of prosthetic heart valve: Secondary | ICD-10-CM | POA: Diagnosis not present

## 2018-01-23 DIAGNOSIS — Z5181 Encounter for therapeutic drug level monitoring: Secondary | ICD-10-CM | POA: Diagnosis not present

## 2018-01-23 LAB — POCT INR: INR: 1.4 — AB (ref 2.0–3.0)

## 2018-01-23 NOTE — Patient Instructions (Signed)
Please take 2 tablets of coumadin tonight, then START NEW DOSAGE of 1.5 tablets daily except 1 on Sundays, Tuesdays, Thursdays. Recheck in 2 weeks

## 2018-01-24 ENCOUNTER — Ambulatory Visit: Payer: Self-pay | Admitting: Family Medicine

## 2018-02-09 DIAGNOSIS — R69 Illness, unspecified: Secondary | ICD-10-CM | POA: Diagnosis not present

## 2018-02-13 ENCOUNTER — Ambulatory Visit (INDEPENDENT_AMBULATORY_CARE_PROVIDER_SITE_OTHER): Payer: Medicare HMO

## 2018-02-13 DIAGNOSIS — Z952 Presence of prosthetic heart valve: Secondary | ICD-10-CM

## 2018-02-13 DIAGNOSIS — Z5181 Encounter for therapeutic drug level monitoring: Secondary | ICD-10-CM

## 2018-02-13 LAB — POCT INR: INR: 1.5 — AB (ref 2.0–3.0)

## 2018-02-13 NOTE — Patient Instructions (Signed)
Please take 2 tablets of coumadin tonight, then START NEW DOSAGE of 1.5 tablets daily. Recheck in 2 weeks.

## 2018-02-18 ENCOUNTER — Ambulatory Visit (INDEPENDENT_AMBULATORY_CARE_PROVIDER_SITE_OTHER): Payer: Medicare HMO | Admitting: Family Medicine

## 2018-02-18 ENCOUNTER — Encounter: Payer: Self-pay | Admitting: Family Medicine

## 2018-02-18 VITALS — BP 136/84 | HR 92 | Temp 97.9°F | Resp 16 | Ht 74.0 in | Wt 251.0 lb

## 2018-02-18 DIAGNOSIS — E1169 Type 2 diabetes mellitus with other specified complication: Secondary | ICD-10-CM | POA: Diagnosis not present

## 2018-02-18 DIAGNOSIS — I5032 Chronic diastolic (congestive) heart failure: Secondary | ICD-10-CM

## 2018-02-18 DIAGNOSIS — Z23 Encounter for immunization: Secondary | ICD-10-CM

## 2018-02-18 NOTE — Progress Notes (Signed)
Subjective:    Patient ID: Nicholas Horton, male    DOB: 01-15-1976, 42 y.o.   MRN: [id number]  Nicholas Horton is a 42 y.o. male presenting on 02/18/2018 for Diabetes   HPI   CHRONIC DM, Type 2: Reports recent diagnosis within past several month, after taking psych meds Zyprexa eventually developed wt gain and hyperglycemia Prior A1c up to 6.8, now he is due for A1c but is here less than 3 months. CBGs: Avg 100s, Low >100, High very rarely >200 Meds: Metformin 1000mg  BID, never on other meds Reports  good compliance. Tolerating well w/o side-effects Currently on ARB Up to date on - [facility] for DM Eye exam Denies hypoglycemia, polyuria, visual changes, numbness or tingling.  Chronic Diastolic CHF / LE Edema Moderate/Severe Bicuspid AS > S/p AVR on anticoag, Complete Heart Block s/p bi-ventric pacer Followed by [facility] Cardiology and EP - with Dr Herbie Baltimore and Dr Graciela Husbands respectively Followed by [facility] Coumadin clinic anticoagulated on warfarin, managed by cards but rx warfarin per PCP - Recent update today, admits increase LE edema, in past few weeks, he tried doubling up lasix with significant improvement, taking Lasix 40mg  normally in AM, then he added PM dosing intermittent with good results, had to take extra dose a few times. He has apt with Cardiology as scheduled on 02/27/18 - Prior yearly ECHO, prior preserved EF 55-60%, some mild diastolic dysfunction. On ARB and Lasix for diuresis. Denies chest pain, dyspnea on exertion, cough, fever, chills, palpitations  Health Maintenance: Due for Flu Shot, will receive today    Depression screen [facility] 2/9 02/18/2018 12/19/2017  Decreased Interest 2 2  Down, Depressed, Hopeless 2 2  PHQ - 2 Score 4 4  Altered sleeping 2 3  Tired, decreased energy 2 3  Change in appetite 3 2  Feeling bad or failure about yourself  2 2  Trouble concentrating 2 3  Moving slowly or fidgety/restless 2 2  Suicidal thoughts 0 1  PHQ-9 Score 17 20  Difficult  doing work/chores Extremely dIfficult Somewhat difficult   GAD 7 : Generalized Anxiety Score 02/18/2018 12/19/2017  Nervous, Anxious, on Edge 3 2  Control/stop worrying 3 2  Worry too much - different things 3 2  Trouble relaxing 3 2  Restless 2 1  Easily annoyed or irritable 3 2  Afraid - awful might happen 3 2  Total GAD 7 Score 20 13  Anxiety Difficulty Extremely difficult Very difficult     Social History   Tobacco Use  . Smoking status: Former Smoker    Packs/day: 2.00    Years: 20.00    Pack years: 40.00    Types: Cigarettes  . Smokeless tobacco: Former Neurosurgeon  . Tobacco comment: quit 10/04/14  Substance Use Topics  . Alcohol use: No    Alcohol/week: 0.0 standard drinks    Comment: rarely  . Drug use: No    Review of Systems Per HPI unless specifically indicated above     Objective:    BP 136/84 (BP Location: Left Arm, Cuff Size: Normal)   Pulse 92   Temp 97.9 F (36.6 C) (Oral)   Resp 16   Ht 6\' 2"  (1.88 m)   Wt 251 lb (113.9 kg)   BMI 32.23 kg/m   Wt Readings from Last 3 Encounters:  02/18/18 251 lb (113.9 kg)  12/18/17 264 lb (119.7 kg)  12/07/17 260 lb (117.9 kg)    Physical Exam  Constitutional: He is oriented to person, place,  and time. He appears well-developed and well-nourished. No distress.  Currently well appearing, comfortable, cooperative, obese  HENT:  Head: Normocephalic and atraumatic.  Mouth/Throat: Oropharynx is clear and moist.  Eyes: Conjunctivae are normal. Right eye exhibits no discharge. Left eye exhibits no discharge.  Neck: Normal range of motion. Neck supple. No thyromegaly present.  Cardiovascular: Normal rate, regular rhythm and intact distal pulses.  Murmur (2/6 systolic murmur) heard. Mechanical S2  Musculoskeletal: Normal range of motion. He exhibits no edema (No significant LE edema today).  Lymphadenopathy:    He has no cervical adenopathy.  Neurological: He is alert and oriented to person, place, and time.  Skin:  Skin is warm and dry. No rash noted. He is not diaphoretic. No erythema.  Psychiatric: He has a normal mood and affect. His behavior is normal.  Well groomed, good eye contact. He has somewhat slowed speech and blunted affect, but his insight into health and mental health is mostly intact.  Nursing note and vitals reviewed.    Diabetic Foot Exam - Simple   Simple Foot Form Diabetic Foot exam was performed with the following findings:  Yes 02/18/2018  1:43 PM  Visual Inspection See comments:  Yes Sensation Testing Intact to touch and monofilament testing bilaterally:  Yes Pulse Check Posterior Tibialis and Dorsalis pulse intact bilaterally:  Yes Comments Bilateral forefoot great toe plantar region callus formation, mild callus and thicker dry skin at heels bilateral, without ulceration or other deformity.     Recent Labs    11/21/17 0519  HGBA1C 6.8*     Results for orders placed or performed in visit on 02/13/18  POCT INR  Result Value Ref Range   INR 1.5 (A) 2.0 - 3.0      Assessment & Plan:   Problem List Items Addressed This Visit    Chronic diastolic heart failure (HCC) (Chronic) Followed by [facility] Cardiology Clinically today euvolemic Hemodynamically stable Recommend continue current course with increased PRN Lasix for diuresis PRN edema wt gain Agree with prompt follow-up as scheduled 10/2 w/ Cardiology for diuretic / med adjust Follow-up    Type 2 diabetes mellitus with other specified complication (HCC) - Primary    Previously controlled DM with A1c < 7, now due for A1c but less than 3 months No known complications or hypoglycemia. Complications - other including hyperlipidemia, depression, obesity - increases risk of future cardiovascular complications / poor glucose control due to with low energy mood and fatigue  Plan:  1. Continue current therapy - Metformin 1000mg  BID - no change to meds, since still pending A1c 2. Encourage improved lifestyle - low carb,  low sugar diet, reduce portion size, continue improving regular exercise 3. Continue ARB - future consider Statin 5. DM Foot exam done today 6. Follow-up 1 week for serum A1c - f/u lab results w/ patient, most likely if near goal A1c < 7.0 then will continue current med. If significantly elevated >8.0 may consider add GLP1 vs SGLT2, or still can defer to more lifestyle intervention until next follow-up in 6 months.       Relevant Orders   Hemoglobin A1c    Other Visit Diagnoses    Needs flu shot       Relevant Orders   Flu Vaccine QUAD 36+ mos IM (Completed)      No orders of the defined types were placed in this encounter.   Follow up plan: Return in about 6 months (around 08/19/2018) for DM A1c.  Saralyn Pilar, DO  [facility] 02/18/2018, 1:51 PM

## 2018-02-18 NOTE — Assessment & Plan Note (Signed)
Previously controlled DM with A1c < 7, now due for A1c but less than 3 months No known complications or hypoglycemia. Complications - other including hyperlipidemia, depression, obesity - increases risk of future cardiovascular complications / poor glucose control due to with low energy mood and fatigue  Plan:  1. Continue current therapy - Metformin 1000mg  BID - no change to meds, since still pending A1c 2. Encourage improved lifestyle - low carb, low sugar diet, reduce portion size, continue improving regular exercise 3. Continue ARB - future consider Statin 5. DM Foot exam done today 6. Follow-up 1 week for serum A1c - f/u lab results w/ patient, most likely if near goal A1c < 7.0 then will continue current med. If significantly elevated >8.0 may consider add GLP1 vs SGLT2, or still can defer to more lifestyle intervention until next follow-up in 6 months.

## 2018-02-18 NOTE — Patient Instructions (Addendum)
Thank you for coming to the office today.  Continue Metformin for blood sugar for now. Since it has not been a full 3 months yet, we cannot check A1c. Please return for blood work in 1 week, we can send you result on MyChart  Likely no change to Metformin if A1c < 8  For now, I support you using the Lasix 40mg  twice daily AS NEEDED for fluid swelling. You can change evening dose to mid afternoon if you prefer. If you don't need extra dose, do not take it. Please discuss this further with your cardiologist on 02/27/18, they may make a change to the med or your dose long-term.  DUE for FASTING BLOOD WORK (no food or drink after midnight before the lab appointment, only water or coffee without cream/sugar on the morning of)  SCHEDULE "Lab Only" visit in the morning at the clinic for lab draw in 1 WEEK  - Make sure Lab Only appointment is at about 1 week before your next appointment, so that results will be available  For Lab Results, once available within 2-3 days of blood draw, you can can log in to MyChart online to view your results and a brief explanation. Also, we can discuss results at next follow-up visit.   Please schedule a Follow-up Appointment to: Return in about 6 months (around 08/19/2018) for DM A1c.  If you have any other questions or concerns, please feel free to call the office or send a message through MyChart. You may also schedule an earlier appointment if necessary.  Additionally, you may be receiving a survey about your experience at our office within a few days to 1 week by e-mail or mail. We value your feedback.  Saralyn Pilar, DO [place], [place]

## 2018-02-27 ENCOUNTER — Ambulatory Visit (INDEPENDENT_AMBULATORY_CARE_PROVIDER_SITE_OTHER): Payer: Medicare HMO

## 2018-02-27 ENCOUNTER — Ambulatory Visit: Payer: Medicare HMO | Admitting: Cardiology

## 2018-02-27 DIAGNOSIS — Z5181 Encounter for therapeutic drug level monitoring: Secondary | ICD-10-CM

## 2018-02-27 DIAGNOSIS — Z952 Presence of prosthetic heart valve: Secondary | ICD-10-CM

## 2018-02-27 LAB — POCT INR: INR: 2.4 (ref 2.0–3.0)

## 2018-02-27 NOTE — Patient Instructions (Signed)
Please continue dosage of 1.5 tablets daily. Recheck in 3 weeks.

## 2018-02-28 ENCOUNTER — Other Ambulatory Visit: Payer: Self-pay | Admitting: Family Medicine

## 2018-02-28 ENCOUNTER — Other Ambulatory Visit: Payer: Medicare HMO

## 2018-02-28 DIAGNOSIS — I350 Nonrheumatic aortic (valve) stenosis: Secondary | ICD-10-CM

## 2018-02-28 DIAGNOSIS — Z7901 Long term (current) use of anticoagulants: Secondary | ICD-10-CM

## 2018-02-28 DIAGNOSIS — E1169 Type 2 diabetes mellitus with other specified complication: Secondary | ICD-10-CM

## 2018-02-28 MED ORDER — WARFARIN SODIUM 5 MG PO TABS: 7.5000 mg | ORAL_TABLET | Freq: Every day | ORAL | 5 refills | Status: DC

## 2018-02-28 NOTE — Telephone Encounter (Signed)
Patient is followed by [facility] Coumadin clinic - reviewed their last documentation for warfarin dosing s/p AVR, they recommended Warfarin 7.5mg  daily (1.5 tab of 5mg ) - sent new rx for this plan  Saralyn Pilar, DO [facility] 02/28/2018, 9:41 PM

## 2018-03-01 LAB — HEMOGLOBIN A1C
Hgb A1c MFr Bld: 5.8 % of total Hgb — ABNORMAL HIGH (ref <5.7)
MEAN PLASMA GLUCOSE: 120 (calc)
eAG (mmol/L): 6.6 (calc)

## 2018-03-04 ENCOUNTER — Encounter: Payer: Self-pay | Admitting: Cardiology

## 2018-03-04 ENCOUNTER — Ambulatory Visit (INDEPENDENT_AMBULATORY_CARE_PROVIDER_SITE_OTHER): Payer: Medicare HMO | Admitting: Cardiology

## 2018-03-04 VITALS — BP 127/87 | HR 100 | Ht 74.0 in | Wt 252.8 lb

## 2018-03-04 DIAGNOSIS — R0609 Other forms of dyspnea: Secondary | ICD-10-CM | POA: Diagnosis not present

## 2018-03-04 DIAGNOSIS — Z7901 Long term (current) use of anticoagulants: Secondary | ICD-10-CM | POA: Diagnosis not present

## 2018-03-04 DIAGNOSIS — I5032 Chronic diastolic (congestive) heart failure: Secondary | ICD-10-CM

## 2018-03-04 DIAGNOSIS — I350 Nonrheumatic aortic (valve) stenosis: Secondary | ICD-10-CM | POA: Diagnosis not present

## 2018-03-04 DIAGNOSIS — R0600 Dyspnea, unspecified: Secondary | ICD-10-CM

## 2018-03-04 DIAGNOSIS — Z950 Presence of cardiac pacemaker: Secondary | ICD-10-CM | POA: Diagnosis not present

## 2018-03-04 DIAGNOSIS — Z952 Presence of prosthetic heart valve: Secondary | ICD-10-CM

## 2018-03-04 DIAGNOSIS — I442 Atrioventricular block, complete: Secondary | ICD-10-CM

## 2018-03-04 MED ORDER — FUROSEMIDE 40 MG PO TABS: ORAL_TABLET | ORAL | 11 refills | Status: DC

## 2018-03-04 MED ORDER — DILTIAZEM HCL ER COATED BEADS 120 MG PO CP24: 120.0000 mg | ORAL_CAPSULE | Freq: Every day | ORAL | 11 refills | Status: DC

## 2018-03-04 MED ORDER — AMOXICILLIN 500 MG PO CAPS: 1000.0000 mg | ORAL_CAPSULE | ORAL | 6 refills | Status: DC | PRN

## 2018-03-04 NOTE — Assessment & Plan Note (Signed)
Refer black to Dr. Hurman Horn in [place] office.

## 2018-03-04 NOTE — Assessment & Plan Note (Signed)
Really hard to tell his volume status.  If anything his weight is down from 3 months ago.  He says he takes occasional extra doses of Lasix and I did recommend that when he does take it (since most of his edema seems to be abdominal) that he just takes 80 mg at a time as opposed to taking 40 millions twice daily.  I suggested that maybe he should take 80 mg total at least 2 days out of the week. He is tachycardic and with preserved EF I think it would actually start him on low-dose diltiazem especially with a likely could have OSA OHS related pulmonary pretension. He is on Lasix and low-dose losartan.  Since he has had some hypotension episodes I am reluctant to push the afterload reduction any further.  Plan: Start diltiazem 120 mg daily, continue low-dose losartan with standing dose of Lasix and at least 2 days a week taking 80 mg as opposed to 40.  Otherwise PRN additional dose taking combination for 80 mg as opposed to 40 mg twice daily.

## 2018-03-04 NOTE — Assessment & Plan Note (Signed)
No bleeding issues.  Followed in [place] office.

## 2018-03-04 NOTE — Assessment & Plan Note (Signed)
Continue to follow echocardiogram annually.

## 2018-03-04 NOTE — Assessment & Plan Note (Signed)
Status post pacemaker.  Will arrange follow-up with EP

## 2018-03-04 NOTE — Progress Notes (Signed)
PCP: Olin Hauser, DO  Clinic Note: Chief Complaint  Patient presents with  . Follow-up  . Cardiac Valve Problem    Status post AVR    HPI: Nicholas Horton is a 42 y.o. male with a PMH below who presents today for 6-7 month f/u for Valvular CM.   Initially with reduced LVEF pre AVR --> most recent Echo EF 55%.   AVR complicated by Heart Block -- s/p PPM (had signifiant dnetal work pre-op)  He has been asking for work disability paperwork02/01/2017 --> still mildly short of breath with activity up to a mile and a half. Was still having a hard time losing weight. Still seemed quite depressed since his surgery.Elnora Morrison was last seen in March 2019 --he was doing pretty well.  Mostly dealing with psychiatric issues, but also was pending follow-up evaluation by pulmonary medicine -now on CPAP for OSA.Marland Kitchen  Noted fast heart rate episodes, but no real chest tightness or pressure.  No syncope or near syncope.  Recent Hospitalizations:   [facility] 11/20/2017: acute hypoxic (hypercapnic) RF. COPD. Referral for OSA Eval.  Studies Personally Reviewed - (if available, images/films reviewed: From Epic Chart or Care Everywhere)  Transthoracic echo April 2019: Over-read - EF ~50% with PPM dyssynchrony.  Mild AS.  nean gradient 24 mmHg.    Echo April 2018: normal EF 55-65%. Normal diastolic function. Mechanical aortic prosthesis in place with mild to moderate stenosis. Peak gradient 43 mmHg. Mean gradient 27 mmHg - stable  Interval History: Nicholas Horton presents today relatively stable cardiac standpoint.  What he mostly notes his abdominal fullness and bloating with some swelling.  He does not have a lower extremity swelling just in his belly.  When this happens to be he becomes more dyspneic.  He has been having lots of respiratory issues since his hospitalization, and actually sometimes will go back on CPAP during the day if he is noticing feeling particularly bad.  His wife  indicates that sometimes he will be sitting there and his lips get blue and he gets confused.  (He himself does not really answer questions --just sits there and let us her answer.)  He tells me he has not really had any sensation of chest discomfort or pressure with rest or exertion but does have exertional dyspnea.  He really does not do much exertion.  He still has a sensation of his heart rate going fast, but denies any rapid irregular heartbeats no syncope or near syncope associated with fast heart rates, but he does have dizzy spells and near pass out spells from what sounds more like pulmonary issues.  He sleeps on multiple pillows but now uses CPAP.  Difficult to assess if this is true orthopnea or simply related to obesity and his paralyzed hemidiaphragm.  No TIA or amaurosis fugax symptoms. No claudication.  Finally on CPAP  ROS: A comprehensive was performed. Review of Systems  Constitutional: Positive for malaise/fatigue. Negative for weight loss.  HENT: Negative for congestion and nosebleeds.   Respiratory: Positive for shortness of breath. Negative for cough, hemoptysis and wheezing.        See HPI.  Cardiovascular: Negative for leg swelling.  Gastrointestinal: Negative for blood in stool and melena.       Abdominal fullness and distention usually is his "edema"  Genitourinary: Negative for hematuria.  Musculoskeletal: Positive for back pain and joint pain. Negative for falls.  Neurological: Positive for dizziness. Negative for seizures and loss of consciousness.  Endo/Heme/Allergies:  Positive for environmental allergies.  Psychiatric/Behavioral: Positive for depression and suicidal ideas. Negative for memory loss and substance abuse. The patient is nervous/anxious and has insomnia.        Now endorsing more symptoms of anhedonia.  Poor sleep, poor healing habits.  He is very much withdrawn not answering questions.  Very submissive.  All other systems reviewed and are  negative.  I have reviewed and (if needed) personally updated the patient's problem list, medications, allergies, past medical and surgical history, social and family history.   Past Medical History:  Diagnosis Date  . CHF (congestive heart failure) ([place])   . Congenital bicuspid aortic valve 09/2014   - s/p AVR  . Nonischemic cardiomyopathy ([place]) -- Resolved[I42.9] 09/2014   EF by Echo 20-25% (pre-op AVR) --> Echo 10/2014 & 03/2015: EF 50-55% - also Recent Normal Diastolic parameters   . S/P AVR (aortic valve replacement) 10/2014   21 mm Carbometrics Mechanical Valve -- Epicardial PPM Leads  . Severe aortic stenosis 10/04/2014   Presented with Syncope & CHF    Past Surgical History:  Procedure Laterality Date  . AORTIC VALVE REPLACEMENT N/A 12/01/2014   Procedure: AORTIC VALVE REPLACEMENT (AVR);  Surgeon: Ivin Poot, MD;  Location: [facility];  Service: Open Heart Surgery;  Laterality: N/A;  . CARDIAC CATHETERIZATION N/A 10/04/2014   Procedure: Left Heart Cath and Coronary Angiography;  Surgeon: Leonie Man, MD;  Location: [facility];  Service: Cardiovascular;  Laterality: N/A;  . CARDIAC CATHETERIZATION  10/04/2014   Procedure: Temporary Pacemaker;  Surgeon: Leonie Man, MD;  Location: [facility];  Service: Cardiovascular;;  . CARDIAC CATHETERIZATION N/A 11/25/2014   Procedure: Temporary Pacemaker;  Surgeon: Evans Lance, MD;  Location: [facility];  Service: Cardiovascular;  Laterality: N/A;  . MULTIPLE EXTRACTIONS WITH ALVEOLOPLASTY N/A 11/25/2014   Procedure: Extraction of tooth #'s 1,2,3,4,5,6,7,9, 10, 11, 12,13,14,16, 19, 20, 21, 22, 23, 24, 25, 26, 27, 28, 29, 30, 31 with alveoloplasty;  Surgeon: Lenn Cal, DDS;  Location: [facility];  Service: Oral Surgery;  Laterality: N/A;  . SURGERY SCROTAL / TESTICULAR     for undescended testicle as a child  . TEE WITHOUT CARDIOVERSION N/A 12/01/2014   Procedure: TRANSESOPHAGEAL ECHOCARDIOGRAM (TEE);  Surgeon: Ivin Poot, MD;  Location: [facility];  Service: Open Heart Surgery;  Laterality: N/A;  . TRANSTHORACIC ECHOCARDIOGRAM  03/2015   Mildly dilated LV. EF 50 at 55%. Normal diastolic parameters. Mild to moderate stenosis of prosthetic aortic valve. Mild to moderate regurgitation  . TRANSTHORACIC ECHOCARDIOGRAM  08/2017   Over-read - EF ~50% with PPM dyssynchrony.  Mild AS.  mean gradient 24 mmHg.     Current Meds  Medication Sig  . albuterol (PROVENTIL HFA;VENTOLIN HFA) 108 (90 Base) MCG/ACT inhaler Inhale 2 puffs into the lungs every 6 (six) hours as needed for wheezing or shortness of breath.  Marland Kitchen aspirin EC 81 MG EC tablet Take 1 tablet (81 mg total) by mouth daily.  . Blood Glucose Monitoring Suppl (ONE TOUCH ULTRA 2) w/Device KIT See admin instructions.  Marland Kitchen buPROPion (WELLBUTRIN SR) 200 MG 12 hr tablet Take 1 tablet (200 mg total) by mouth 2 (two) times daily.  Marland Kitchen dicyclomine (BENTYL) 10 MG capsule Take 10 mg by mouth 4 (four) times daily as needed.  . furosemide (LASIX) 40 MG tablet Take one tablet by mouth daily except mondays and thursdays take two tablet  and may take an extra one dose  additional if needed  . Glycopyrrolate-Formoterol (BEVESPI AEROSPHERE IN) Inhale 2 puffs into the lungs 2 (two) times daily.  Marland Kitchen losartan (COZAAR) 25 MG tablet Take 1 tablet (25 mg total) by mouth daily.  . metFORMIN (GLUCOPHAGE) 1000 MG tablet Take 1 tablet (1,000 mg total) by mouth 2 (two) times daily.  Marland Kitchen OLANZapine (ZYPREXA) 2.5 MG tablet Take 8 tablets (20 mg total) by mouth at bedtime.  . sertraline (ZOLOFT) 50 MG tablet Take 50 mg by mouth every morning.  . warfarin (COUMADIN) 5 MG tablet Take 1.5 tablets (7.5 mg total) by mouth daily.  . [DISCONTINUED] furosemide (LASIX) 40 MG tablet Take one tablet by mouth daily and may take an extra one dose additional if needed    No Known Allergies  Social History   Tobacco Use  . Smoking status: Former Smoker    Packs/day: 2.00    Years: 20.00    Pack years:  40.00    Types: Cigarettes  . Smokeless tobacco: Former Systems developer  . Tobacco comment: quit 10/04/14  Substance Use Topics  . Alcohol use: No    Alcohol/week: 0.0 standard drinks    Comment: rarely  . Drug use: No   Social History   Social History Narrative   Lives in [place], [place] with wife and daughter.    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    family history is not on file.  Wt Readings from Last 3 Encounters:  03/04/18 252 lb 12.8 oz (114.7 kg)  02/18/18 251 lb (113.9 kg)  12/18/17 264 lb (119.7 kg)    PHYSICAL EXAM BP 127/87   Pulse 100   Ht 6' 2" (1.88 m)   Wt 252 lb 12.8 oz (114.7 kg)   BMI 32.46 kg/m  Physical Exam  Constitutional: He is oriented to person, place, and time. He appears well-developed and well-nourished. No distress.  Obese, but seems somewhat tired and lethargic.  Very submissive  HENT:  Head: Normocephalic and atraumatic.  Mouth/Throat: Oropharynx is clear and moist. No oropharyngeal exudate.  Edentulous  Neck: Normal range of motion. Neck supple. No hepatojugular reflux and no JVD present. Carotid bruit is not present.  Cardiovascular: Normal rate, regular rhythm, S1 normal and intact distal pulses.  Occasional extrasystoles are present. PMI is not displaced. Exam reveals no gallop and no friction rub.  Murmur (2/6 mid-late peaking C-D SEM at RUSB; soft HSM at apex) heard. Metallic S2  Pulmonary/Chest: Effort normal and breath sounds normal. No respiratory distress. He has no wheezes.  Abdominal: Soft. Bowel sounds are normal. He exhibits no distension. There is no tenderness. There is no rebound.  Protuberant abdomen, obese, does not appear to be ascites or with any tissue edema.  Musculoskeletal: Normal range of motion. He exhibits no edema (Trivial).  Neurological: He is alert and oriented to person, place, and time.  Skin: He is not diaphoretic.  Psychiatric: Thought content normal. His affect is blunt and  inappropriate. He is withdrawn. He expresses inappropriate judgment. He exhibits a depressed mood.  He still has a blunted/ flat affect.  He keeps his head was down the entire time. Barely spoke to questions until the very end. Seems to have overall lack of motivation to improve. No longer concern for suicidal ideation.    Adult ECG Report A sensed-V paced.  90 bpm.  Otherwise normal  Other studies Reviewed: Additional studies/ records that were reviewed today include:  Recent Labs:   No recent labs  ASSESSMENT /  PLAN: Problem List Items Addressed This Visit    Cardiac pacemaker in situ (Chronic)    Refer black to Dr. Adam Phenix in [place] office.      Chronic diastolic heart failure (HCC) - Primary (Chronic)    Really hard to tell his volume status.  If anything his weight is down from 3 months ago.  He says he takes occasional extra doses of Lasix and I did recommend that when he does take it (since most of his edema seems to be abdominal) that he just takes 80 mg at a time as opposed to taking 40 millions twice daily.  I suggested that maybe he should take 80 mg total at least 2 days out of the week. He is tachycardic and with preserved EF I think it would actually start him on low-dose diltiazem especially with a likely could have OSA OHS related pulmonary pretension. He is on Lasix and low-dose losartan.  Since he has had some hypotension episodes I am reluctant to push the afterload reduction any further.  Plan: Start diltiazem 120 mg daily, continue low-dose losartan with standing dose of Lasix and at least 2 days a week taking 80 mg as opposed to 40.  Otherwise PRN additional dose taking combination for 80 mg as opposed to 40 mg twice daily.      Relevant Medications   furosemide (LASIX) 40 MG tablet   diltiazem (CARDIZEM CD) 120 MG 24 hr capsule   Other Relevant Orders   ECHOCARDIOGRAM COMPLETE   Complete heart block (HCC) (Chronic)    Status post pacemaker.  Will  arrange follow-up with EP      Relevant Medications   furosemide (LASIX) 40 MG tablet   diltiazem (CARDIZEM CD) 120 MG 24 hr capsule   Dyspnea on exertion (Chronic)    Multifactorial, probably more related to obesity OHS and OSA with COPD as well as deconditioning.  We totally stop his beta-blocker because of fatigue now starting diltiazem for possible ocular reduction and pulmonary retention.  Also for rate control.      Relevant Orders   ECHOCARDIOGRAM COMPLETE   Moderate to severe AS with bicuspid valve. Mean 33, peak 61 mmHg. (Chronic)    Status post AVR.  He initially had pretty significant valvular cardia myopathy, but now preserved EF.  Continue to follow annual echocardiogram to monitor prosthetic stenosis      Relevant Medications   furosemide (LASIX) 40 MG tablet   diltiazem (CARDIZEM CD) 120 MG 24 hr capsule   Other Relevant Orders   ECHOCARDIOGRAM COMPLETE   S/P AVR (Chronic)    Continue to follow echocardiogram annually.      Relevant Orders   ECHOCARDIOGRAM COMPLETE   Warfarin anticoagulation    No bleeding issues.  Followed in [place] office.         Current medicines are reviewed at length with the patient today. (+/- concerns) Still noting fatigue The following changes have been made: --Okay to supplementation of testosterone treat with per PCP.  Target testosterone level should be low normal.  Patient Instructions  Medication Instructions:   DILTIAZEM 120 MG ONE CAPSULE A DAY  FUROSEMIDE 80 MG TAKE 2 DAYS OF THE WEEK - EXAMPLE EVERY Monday AND Thursday.  THE OTHER DAYS TAKE 40 MG DAILY  ,IF YOU HAVE TO TAKE AN EXTRA 40 MG TAKE THEM BOTH AT THE SAMETIME THAT DAY.    If you need a refill on your cardiac medications before your next appointment, please call your pharmacy.  Lab work:  NOT NEEDED   Testing/Procedures: PLEASE SCHEDULE AT [place] OFFICE IN April 2020 Your physician has requested that you have an echocardiogram.  Echocardiography is a painless test that uses sound waves to create images of your heart. It provides your doctor with information about the size and shape of your heart and how well your heart's chambers and valves are working. This procedure takes approximately one hour. There are no restrictions for this procedure.    Follow-Up: At [facility], you and your health needs are our priority.  As part of our continuing mission to provide you with exceptional heart care, we have created designated Provider Care Teams.  These Care Teams include your primary Cardiologist (physician) and Advanced Practice Providers (APPs -  Physician Assistants and Nurse Practitioners) who all work together to provide you with the care you need, when you need it. Your physician recommends that you schedule a follow-up appointment in [place] will need a follow up appointment in 6 months.  Please call our office 2 months in advance to schedule this appointment.  You may see Glenetta Hew, MD or one of the following Advanced Practice Providers on your designated Care Team:   Rosaria Ferries, PA-C . Jory Sims, DNP, ANP    Any Other Special Instructions Will Be Listed Below (If Applicable).  WHEN YOU FEEL LIKE YOU CAN NOT CATCH YOUR BREATHE -  PLACE YOUR ARMS BEHIND YOUR HEAD AN TAKE DEEP BREATHES , IF NOT BETTER  GO USE YOUR CP AP MACHINE.     Studies Ordered:   Orders Placed This Encounter  Procedures  . ECHOCARDIOGRAM COMPLETE      Glenetta Hew, M.D., M.S. Interventional Cardiologist   Pager # [phone] Phone # [phone] [address] [place], [place] [address]

## 2018-03-04 NOTE — Patient Instructions (Signed)
Medication Instructions:   DILTIAZEM 120 MG ONE CAPSULE A DAY  FUROSEMIDE 80 MG TAKE 2 DAYS OF THE WEEK - EXAMPLE EVERY Monday AND Thursday.  THE OTHER DAYS TAKE 40 MG DAILY  ,IF YOU HAVE TO TAKE AN EXTRA 40 MG TAKE THEM BOTH AT THE SAMETIME THAT DAY.    If you need a refill on your cardiac medications before your next appointment, please call your pharmacy.   Lab work:  NOT NEEDED   Testing/Procedures: PLEASE SCHEDULE AT [place] OFFICE IN April 2020 Your physician has requested that you have an echocardiogram. Echocardiography is a painless test that uses sound waves to create images of your heart. It provides your doctor with information about the size and shape of your heart and how well your heart's chambers and valves are working. This procedure takes approximately one hour. There are no restrictions for this procedure.    Follow-Up: At [facility], you and your health needs are our priority.  As part of our continuing mission to provide you with exceptional heart care, we have created designated Provider Care Teams.  These Care Teams include your primary Cardiologist (physician) and Advanced Practice Providers (APPs -  Physician Assistants and Nurse Practitioners) who all work together to provide you with the care you need, when you need it. Your physician recommends that you schedule a follow-up appointment in DR Graciela Husbands IN [place] - PACER INTERROGATION  You will need a follow up appointment in 6 months.  Please call our office 2 months in advance to schedule this appointment.  You may see [name], MD or one of the following Advanced Practice Providers on your designated Care Team:   Theodore Demark, PA-C . Joni Reining, DNP, ANP    Any Other Special Instructions Will Be Listed Below (If Applicable).  WHEN YOU FEEL LIKE YOU CAN NOT CATCH YOUR BREATHE -  PLACE YOUR ARMS BEHIND YOUR HEAD AN TAKE DEEP BREATHES , IF NOT BETTER  GO USE YOUR CP AP MACHINE.

## 2018-03-04 NOTE — Assessment & Plan Note (Signed)
Multifactorial, probably more related to obesity OHS and OSA with COPD as well as deconditioning.  We totally stop his beta-blocker because of fatigue now starting diltiazem for possible ocular reduction and pulmonary retention.  Also for rate control.

## 2018-03-04 NOTE — Assessment & Plan Note (Signed)
Status post AVR.  He initially had pretty significant valvular cardia myopathy, but now preserved EF.  Continue to follow annual echocardiogram to monitor prosthetic stenosis

## 2018-03-05 ENCOUNTER — Ambulatory Visit (INDEPENDENT_AMBULATORY_CARE_PROVIDER_SITE_OTHER): Payer: Medicare HMO | Admitting: *Deleted

## 2018-03-05 DIAGNOSIS — I442 Atrioventricular block, complete: Secondary | ICD-10-CM

## 2018-03-05 DIAGNOSIS — I5032 Chronic diastolic (congestive) heart failure: Secondary | ICD-10-CM

## 2018-03-06 NOTE — Progress Notes (Signed)
Remote pacemaker transmission.   

## 2018-03-07 NOTE — Progress Notes (Signed)
[facility] [facility]     Assessment and Plan:  Obstructive sleep apnea.  - Currently on BiPAP, obtained through charity care. - Currently a CPAP download is not available, however the patient tells me he is using it every night for about 7 hours per night, and he is less sleepy during the day. - Continue BiPAP.  Insomnia, poor sleep hygiene. -Patient is going to bed very late, and waking up late. -We again discussed ways to try to improve his sleep hygiene, detailed in patient instructions.  Asthma with hospitalization in June 2019 for asthma exacerbation. -Patient was previously being seen at [facility], he would like to transfer his care appear. -Continue Bevespi- albuterol.  No follow-ups on file.    Date: 03/07/2018  MRN# [id number] Nicholas Horton [birth year]    Nicholas Horton is a 42 y.o. old male seen in consultation for chief complaint of:    Chief Complaint  Patient presents with  . Sleep Apnea    pt states he uses cpap nightly and is not having any trouble. He was told he needed appt for cpap f/u per ins.    HPI:  The patient is a 42 year old male, last visit he was noted to have a history of asthma he was asked to continue Bevespi, he follows with a pulmonologist at [facility].  He was also noted to have excessive daytime sleepiness with poor sleep hygiene, insomnia with minimal sleep at night and sleeping a lot during the day.  We also discussed ways to improve his sleep hygiene.  At a previous visit he was sent for sleep study which showed moderate obstructive sleep apnea with an AHI of 17, he was recommended to be on auto BiPAP, which was obtained through [facility] charity care, supplied by [employer].   He has been using the PAP every night, usually puts in on 11-12 MN, he takes it around 7 am. He is feeling more rested during the day, he is no longer snoring at night.   He does not want to go to [facility] anymore for pulmonary. He feels that his brathing has been ok. He is  taking ventolin, 3 days per week. He bevespi 2 puffs twice daily. He feels that it helps.  He is not smoking.  He has no pets. He denies reflux. He does have sinus drainage, does not take nasal spray.    **Split-night sleep study 10/24/2016>> AHI 17 , recommend to be on BiPAP auto, low expiratory pressure 8, maximum sori pressure of 15, pressure support of 5.   Social Hx:   Social History   Tobacco Use  . Smoking status: Former Smoker    Packs/day: 2.00    Years: 20.00    Pack years: 40.00    Types: Cigarettes  . Smokeless tobacco: Former Neurosurgeon  . Tobacco comment: quit 10/04/14  Substance Use Topics  . Alcohol use: No    Alcohol/week: 0.0 standard drinks    Comment: rarely  . Drug use: No   Medication:   Reviewed.    Allergies:  Patient has no known allergies.   Review of Systems:  Constitutional: Feels well. Cardiovascular: Denies chest pain, exertional chest pain.  Pulmonary: Denies hemoptysis, pleuritic chest pain.   The remainder of systems were reviewed and were found to be negative other than what is documented in the HPI.    Physical Examination:   VS: BP 124/70 (BP Location: Left Arm, Cuff Size: Normal)   Pulse 87   Resp 16  Ht 6\' 2"  (1.88 m)   Wt 250 lb (113.4 kg)   SpO2 95%   BMI 32.10 kg/m   General Appearance: No distress  Neuro:without focal findings, mental status, speech normal, alert and oriented HEENT: PERRLA, EOM intact Pulmonary: No wheezing, No rales  CardiovascularNormal S1,S2.  No m/r/g.  Abdomen: Benign, Soft, non-tender, No masses Renal:  No costovertebral tenderness  GU:  No performed at this time. Endoc: No evident thyromegaly, no signs of acromegaly or Cushing features Skin:   warm, no rashes, no ecchymosis  Extremities: normal, no cyanosis, clubbing.        LABORATORY PANEL:   CBC No results for input(s): WBC, HGB, HCT, PLT in the last 168  hours. ------------------------------------------------------------------------------------------------------------------  Chemistries  No results for input(s): NA, K, CL, CO2, GLUCOSE, BUN, CREATININE, CALCIUM, MG, AST, ALT, ALKPHOS, BILITOT in the last 168 hours.  Invalid input(s): GFRCGP ------------------------------------------------------------------------------------------------------------------  Cardiac Enzymes No results for input(s): TROPONINI in the last 168 hours. ------------------------------------------------------------  RADIOLOGY:  No results found.     Thank  you for the consultation and for allowing [facility] to assist in the care of your patient. Our recommendations are noted above.  Please contact us if we can be of further service.  Wells Guiles, M.D., F.C.C.P.  Board Certified in Internal Medicine, Pulmonary Medicine, Critical Care Medicine, and Sleep Medicine.  [facility] Office Number: [phone]  03/07/2018

## 2018-03-08 ENCOUNTER — Encounter: Payer: Self-pay | Admitting: Internal Medicine

## 2018-03-08 ENCOUNTER — Ambulatory Visit (INDEPENDENT_AMBULATORY_CARE_PROVIDER_SITE_OTHER): Payer: Medicare HMO | Admitting: Internal Medicine

## 2018-03-08 VITALS — BP 124/70 | HR 87 | Resp 16 | Ht 74.0 in | Wt 250.0 lb

## 2018-03-08 DIAGNOSIS — J4540 Moderate persistent asthma, uncomplicated: Secondary | ICD-10-CM

## 2018-03-08 DIAGNOSIS — G4733 Obstructive sleep apnea (adult) (pediatric): Secondary | ICD-10-CM | POA: Diagnosis not present

## 2018-03-08 DIAGNOSIS — F5101 Primary insomnia: Secondary | ICD-10-CM | POA: Diagnosis not present

## 2018-03-08 DIAGNOSIS — R69 Illness, unspecified: Secondary | ICD-10-CM | POA: Diagnosis not present

## 2018-03-08 NOTE — Patient Instructions (Signed)
Continue using CPAP every night for the entire night.  Continue bevespi, albuterol.  Start flonase 2 sprays in each nostril once daily.

## 2018-03-13 ENCOUNTER — Encounter: Payer: Self-pay | Admitting: Cardiology

## 2018-03-16 DIAGNOSIS — R69 Illness, unspecified: Secondary | ICD-10-CM | POA: Diagnosis not present

## 2018-03-18 ENCOUNTER — Encounter: Payer: Self-pay | Admitting: Family Medicine

## 2018-03-18 ENCOUNTER — Ambulatory Visit (INDEPENDENT_AMBULATORY_CARE_PROVIDER_SITE_OTHER): Payer: Medicare HMO | Admitting: Family Medicine

## 2018-03-18 VITALS — BP 116/67 | HR 76 | Temp 103.1°F | Resp 16 | Ht 74.0 in | Wt 247.6 lb

## 2018-03-18 DIAGNOSIS — J101 Influenza due to other identified influenza virus with other respiratory manifestations: Secondary | ICD-10-CM | POA: Diagnosis not present

## 2018-03-18 DIAGNOSIS — R6889 Other general symptoms and signs: Secondary | ICD-10-CM

## 2018-03-18 LAB — POCT INFLUENZA A/B
INFLUENZA A, POC: POSITIVE — AB
Influenza B, POC: NEGATIVE

## 2018-03-18 MED ORDER — OSELTAMIVIR PHOSPHATE 75 MG PO CAPS: 75.0000 mg | ORAL_CAPSULE | Freq: Two times a day (BID) | ORAL | 0 refills | Status: DC

## 2018-03-18 NOTE — Patient Instructions (Addendum)
Thank you for coming to the office today.  INFLUENZA TEST WAS POSITIVE FOR FLU A  - Start Tamiflu (anti-flu medicine) take one capsule 75mg  twice a day for 5 days - Wash hands and cover cough very well to avoid spread of infection - For symptom control:      - Take Ibuprofen / Advil 400-600mg  every 6-8 hours as needed for fever / muscle aches, and may also take Tylenol 500-1000mg  per dose every 6-8 hours or 3 times a day, can alternate dosing      -May take OTC Flu/Cold medicine such as DayQuil / NyQuil and OTC Mucinex-DM for cough and congestion for up to 7 days - Improve hydration with plenty of clear fluids  If significant worsening with poor fluid intake, worsening fever, difficulty breathing due to coughing, worsening body aches, weakness, or other more concerning symptoms difficulty breathing you can seek treatment at Emergency Department. Also if improved flu symptoms and then worsening days to week later with concerns for bronchitis, productive cough fever chills again we may need to check for possible pneumonia that can occur after the flu  Please schedule a follow-up appointment with Dr. Althea Charon in 1-2 weeks as needed if worsening from Flu / Bronchitis  Please schedule a Follow-up Appointment to: Return in about 1 week (around 03/25/2018), or if symptoms worsen or fail to improve, for flu-like.  If you have any other questions or concerns, please feel free to call the office or send a message through MyChart. You may also schedule an earlier appointment if necessary.  Additionally, you may be receiving a survey about your experience at our office within a few days to 1 week by e-mail or mail. We value your feedback.  Saralyn Pilar, DO [facility], [place]

## 2018-03-18 NOTE — Progress Notes (Signed)
Subjective:    Patient ID: Nicholas Horton, male    DOB: 1976-03-06, 42 y.o.   MRN: [id number]  Nicholas Horton is a 42 y.o. male presenting on 03/18/2018 for Fever (HR 140 SOB chills bodyache patient has his flu shot onset 3 days)  Patient presents for a same day appointment.  HPI   INFLUENZA A - Reports new problem with fevers and dyspnea and some aches over past 3 days now. No known sick contacts. He is not coughing a lot or having congestion symptoms - Taking Tylenol 1500mg  x 1 daily - temporarily improve fever, but it will come back, not taking other meds - He has never had the flu before. Admits high fever 101 to 103, with chills and body aches Denies any abdominal pain, nausea vomiting, headache, vision change, rash  Health Maintenance: UTD Flu vaccine 01/2018  Depression screen [facility] 2/9 02/18/2018 12/19/2017  Decreased Interest 2 2  Down, Depressed, Hopeless 2 2  PHQ - 2 Score 4 4  Altered sleeping 2 3  Tired, decreased energy 2 3  Change in appetite 3 2  Feeling bad or failure about yourself  2 2  Trouble concentrating 2 3  Moving slowly or fidgety/restless 2 2  Suicidal thoughts 0 1  PHQ-9 Score 17 20  Difficult doing work/chores Extremely dIfficult Somewhat difficult    Social History   Tobacco Use  . Smoking status: Former Smoker    Packs/day: 2.00    Years: 20.00    Pack years: 40.00    Types: Cigarettes  . Smokeless tobacco: Former Neurosurgeon  . Tobacco comment: quit 10/04/14  Substance Use Topics  . Alcohol use: No    Alcohol/week: 0.0 standard drinks    Comment: rarely  . Drug use: No    Review of Systems Per HPI unless specifically indicated above     Objective:    BP 116/67   Pulse 76   Temp (!) 103.1 F (39.5 C)   Resp 16   Ht 6\' 2"  (1.88 m)   Wt 247 lb 9.6 oz (112.3 kg)   SpO2 92%   BMI 31.79 kg/m   Wt Readings from Last 3 Encounters:  03/18/18 247 lb 9.6 oz (112.3 kg)  03/08/18 250 lb (113.4 kg)  03/04/18 252 lb 12.8 oz (114.7 kg)      Physical Exam  Constitutional: He is oriented to person, place, and time. He appears well-developed and well-nourished. No distress.  Well-appearing, comfortable, cooperative  HENT:  Head: Normocephalic and atraumatic.  Mouth/Throat: Oropharynx is clear and moist.  Frontal / maxillary sinuses non-tender. Nares patent without purulence or edema. Bilateral TMs clear without erythema, effusion or bulging. Oropharynx clear without erythema, exudates, edema or asymmetry.  Eyes: Conjunctivae are normal. Right eye exhibits no discharge. Left eye exhibits no discharge.  Cardiovascular: Normal rate, regular rhythm, normal heart sounds and intact distal pulses.  Pulmonary/Chest: Effort normal and breath sounds normal. No respiratory distress.  Good air movement. Speaks full sentences. No coughing.  Musculoskeletal: Normal range of motion. He exhibits no edema.  Neurological: He is alert and oriented to person, place, and time.  Skin: Skin is warm and dry. No rash noted. He is not diaphoretic. No erythema.  Psychiatric: He has a normal mood and affect. His behavior is normal.  Well groomed, good eye contact, normal speech and thoughts  Nursing note and vitals reviewed.  Results for orders placed or performed in visit on 03/18/18  POCT Influenza A/B  Result Value Ref  Range   Influenza A, POC Positive (A) Negative   Influenza B, POC Negative Negative      Assessment & Plan:   Problem List Items Addressed This Visit    None    Visit Diagnoses    Influenza    -  Primary   Relevant Medications   oseltamivir (TAMIFLU) 75 MG capsule   Flu-like symptoms       Relevant Medications   oseltamivir (TAMIFLU) 75 MG capsule   Other Relevant Orders   POCT Influenza A/B (Completed)       Clinically consistent with flu and confirmed Influenza A today on rapid flu test - Duration x 3 days, without complication. Tolerating PO and well hydrated - No other focal findings of infection today - Already  S/p influenza vaccine this season about 1 month ago  Plan: 1. Start Tamiflu 75mg  capsules BID x 5 days 2. Supportive care as advised with NSAID / Tylenol PRN fever/myalgias, improve hydration, may take OTC Cold/Flu meds 3. He declined supportive medication rx - tessalon, atrovent, zofran 4. Return criteria given if significant worsening, consider post-influenza complications, otherwise follow-up if needed - if worsening may need to return for Chest X-ray if concern for secondary pneumonia   Meds ordered this encounter  Medications  . oseltamivir (TAMIFLU) 75 MG capsule    Sig: Take 1 capsule (75 mg total) by mouth 2 (two) times daily. For 5 days    Dispense:  10 capsule    Refill:  0    Follow up plan: Return in about 1 week (around 03/25/2018), or if symptoms worsen or fail to improve, for flu-like.   Saralyn Pilar, DO [facility] 03/18/2018, 9:12 AM

## 2018-03-19 ENCOUNTER — Encounter (INDEPENDENT_AMBULATORY_CARE_PROVIDER_SITE_OTHER): Payer: Medicare HMO | Admitting: Internal Medicine

## 2018-03-19 DIAGNOSIS — Z0000 Encounter for general adult medical examination without abnormal findings: Secondary | ICD-10-CM

## 2018-03-19 NOTE — Progress Notes (Signed)
Left without my seeing h im as a just been to his PCP where he was diagnosed with positive flu and had a temperature of 103.

## 2018-03-20 ENCOUNTER — Inpatient Hospital Stay (HOSPITAL_COMMUNITY)
Admit: 2018-03-20 | Discharge: 2018-03-20 | Disposition: A | Discharge status: Home / Self Care | Payer: Medicare HMO | Attending: Pulmonary Disease | Admitting: Pulmonary Disease

## 2018-03-20 ENCOUNTER — Other Ambulatory Visit: Payer: Self-pay

## 2018-03-20 ENCOUNTER — Inpatient Hospital Stay
Admission: EM | Admit: 2018-03-20 | Discharge: 2018-03-24 | DRG: 871 | Disposition: A | Discharge status: Other Acute Inpatient Hospital | Payer: Medicare HMO | Attending: Internal Medicine | Admitting: Internal Medicine

## 2018-03-20 ENCOUNTER — Encounter: Payer: Self-pay | Admitting: Emergency Medicine

## 2018-03-20 ENCOUNTER — Emergency Department: Payer: Medicare HMO

## 2018-03-20 DIAGNOSIS — J9690 Respiratory failure, unspecified, unspecified whether with hypoxia or hypercapnia: Secondary | ICD-10-CM | POA: Diagnosis not present

## 2018-03-20 DIAGNOSIS — Z952 Presence of prosthetic heart valve: Secondary | ICD-10-CM

## 2018-03-20 DIAGNOSIS — R0602 Shortness of breath: Secondary | ICD-10-CM | POA: Diagnosis not present

## 2018-03-20 DIAGNOSIS — T45515A Adverse effect of anticoagulants, initial encounter: Secondary | ICD-10-CM | POA: Diagnosis present

## 2018-03-20 DIAGNOSIS — R0689 Other abnormalities of breathing: Secondary | ICD-10-CM

## 2018-03-20 DIAGNOSIS — J181 Lobar pneumonia, unspecified organism: Secondary | ICD-10-CM

## 2018-03-20 DIAGNOSIS — Z5320 Procedure and treatment not carried out because of patient's decision for unspecified reasons: Secondary | ICD-10-CM | POA: Diagnosis not present

## 2018-03-20 DIAGNOSIS — J9601 Acute respiratory failure with hypoxia: Secondary | ICD-10-CM

## 2018-03-20 DIAGNOSIS — Z87891 Personal history of nicotine dependence: Secondary | ICD-10-CM

## 2018-03-20 DIAGNOSIS — J101 Influenza due to other identified influenza virus with other respiratory manifestations: Secondary | ICD-10-CM | POA: Diagnosis not present

## 2018-03-20 DIAGNOSIS — E781 Pure hyperglyceridemia: Secondary | ICD-10-CM | POA: Diagnosis present

## 2018-03-20 DIAGNOSIS — J1008 Influenza due to other identified influenza virus with other specified pneumonia: Secondary | ICD-10-CM | POA: Diagnosis present

## 2018-03-20 DIAGNOSIS — R791 Abnormal coagulation profile: Secondary | ICD-10-CM | POA: Diagnosis present

## 2018-03-20 DIAGNOSIS — Z7901 Long term (current) use of anticoagulants: Secondary | ICD-10-CM | POA: Diagnosis not present

## 2018-03-20 DIAGNOSIS — D649 Anemia, unspecified: Secondary | ICD-10-CM | POA: Diagnosis present

## 2018-03-20 DIAGNOSIS — I428 Other cardiomyopathies: Secondary | ICD-10-CM | POA: Diagnosis not present

## 2018-03-20 DIAGNOSIS — J111 Influenza due to unidentified influenza virus with other respiratory manifestations: Secondary | ICD-10-CM

## 2018-03-20 DIAGNOSIS — R6520 Severe sepsis without septic shock: Secondary | ICD-10-CM | POA: Diagnosis not present

## 2018-03-20 DIAGNOSIS — J9600 Acute respiratory failure, unspecified whether with hypoxia or hypercapnia: Secondary | ICD-10-CM

## 2018-03-20 DIAGNOSIS — Z7984 Long term (current) use of oral hypoglycemic drugs: Secondary | ICD-10-CM | POA: Diagnosis not present

## 2018-03-20 DIAGNOSIS — Z452 Encounter for adjustment and management of vascular access device: Secondary | ICD-10-CM

## 2018-03-20 DIAGNOSIS — E873 Alkalosis: Secondary | ICD-10-CM | POA: Diagnosis not present

## 2018-03-20 DIAGNOSIS — Z7982 Long term (current) use of aspirin: Secondary | ICD-10-CM | POA: Diagnosis not present

## 2018-03-20 DIAGNOSIS — Z79899 Other long term (current) drug therapy: Secondary | ICD-10-CM | POA: Diagnosis not present

## 2018-03-20 DIAGNOSIS — I5022 Chronic systolic (congestive) heart failure: Secondary | ICD-10-CM | POA: Diagnosis not present

## 2018-03-20 DIAGNOSIS — A419 Sepsis, unspecified organism: Principal | ICD-10-CM

## 2018-03-20 DIAGNOSIS — E1165 Type 2 diabetes mellitus with hyperglycemia: Secondary | ICD-10-CM | POA: Diagnosis present

## 2018-03-20 DIAGNOSIS — Z9911 Dependence on respirator [ventilator] status: Secondary | ICD-10-CM | POA: Diagnosis not present

## 2018-03-20 DIAGNOSIS — E869 Volume depletion, unspecified: Secondary | ICD-10-CM | POA: Diagnosis present

## 2018-03-20 DIAGNOSIS — J80 Acute respiratory distress syndrome: Secondary | ICD-10-CM

## 2018-03-20 DIAGNOSIS — J1108 Influenza due to unidentified influenza virus with specified pneumonia: Secondary | ICD-10-CM | POA: Diagnosis not present

## 2018-03-20 DIAGNOSIS — Z7401 Bed confinement status: Secondary | ICD-10-CM | POA: Diagnosis not present

## 2018-03-20 DIAGNOSIS — J189 Pneumonia, unspecified organism: Secondary | ICD-10-CM | POA: Diagnosis not present

## 2018-03-20 DIAGNOSIS — Z95810 Presence of automatic (implantable) cardiac defibrillator: Secondary | ICD-10-CM | POA: Diagnosis not present

## 2018-03-20 DIAGNOSIS — Z9281 Personal history of extracorporeal membrane oxygenation (ECMO): Secondary | ICD-10-CM | POA: Diagnosis not present

## 2018-03-20 DIAGNOSIS — Z4682 Encounter for fitting and adjustment of non-vascular catheter: Secondary | ICD-10-CM | POA: Diagnosis not present

## 2018-03-20 DIAGNOSIS — I420 Dilated cardiomyopathy: Secondary | ICD-10-CM | POA: Diagnosis not present

## 2018-03-20 DIAGNOSIS — R9431 Abnormal electrocardiogram [ECG] [EKG]: Secondary | ICD-10-CM | POA: Diagnosis not present

## 2018-03-20 DIAGNOSIS — J9621 Acute and chronic respiratory failure with hypoxia: Secondary | ICD-10-CM | POA: Diagnosis not present

## 2018-03-20 DIAGNOSIS — R945 Abnormal results of liver function studies: Secondary | ICD-10-CM

## 2018-03-20 DIAGNOSIS — Z888 Allergy status to other drugs, medicaments and biological substances status: Secondary | ICD-10-CM

## 2018-03-20 DIAGNOSIS — Z136 Encounter for screening for cardiovascular disorders: Secondary | ICD-10-CM | POA: Diagnosis not present

## 2018-03-20 DIAGNOSIS — R7989 Other specified abnormal findings of blood chemistry: Secondary | ICD-10-CM

## 2018-03-20 DIAGNOSIS — R0603 Acute respiratory distress: Secondary | ICD-10-CM | POA: Diagnosis not present

## 2018-03-20 LAB — COMPREHENSIVE METABOLIC PANEL
ALBUMIN: 3.5 g/dL (ref 3.5–5.0)
ALT: 35 U/L (ref 0–44)
AST: 32 U/L (ref 15–41)
Alkaline Phosphatase: 43 U/L (ref 38–126)
Anion gap: 11 (ref 5–15)
BUN: 14 mg/dL (ref 6–20)
CO2: 26 mmol/L (ref 22–32)
Calcium: 8.2 mg/dL — ABNORMAL LOW (ref 8.9–10.3)
Chloride: 100 mmol/L (ref 98–111)
Creatinine, Ser: 1.05 mg/dL (ref 0.61–1.24)
GFR calc Af Amer: >60 mL/min (ref >60)
GFR calc non Af Amer: >60 mL/min (ref >60)
GLUCOSE: 146 mg/dL — AB (ref 70–99)
Potassium: 3.7 mmol/L (ref 3.5–5.1)
SODIUM: 137 mmol/L (ref 135–145)
Total Bilirubin: 1.4 mg/dL — ABNORMAL HIGH (ref 0.3–1.2)
Total Protein: 7 g/dL (ref 6.5–8.1)

## 2018-03-20 LAB — BLOOD GAS, VENOUS
Acid-Base Excess: 3.7 mmol/L — ABNORMAL HIGH (ref 0.0–2.0)
BICARBONATE: 26.3 mmol/L (ref 20.0–28.0)
O2 SAT: 88.8 %
PATIENT TEMPERATURE: 37
PO2 VEN: 50 mmHg — AB (ref 32.0–45.0)
pCO2, Ven: 33 mmHg — ABNORMAL LOW (ref 44.0–60.0)
pH, Ven: 7.51 — ABNORMAL HIGH (ref 7.250–7.430)

## 2018-03-20 LAB — URINALYSIS, COMPLETE (UACMP) WITH MICROSCOPIC
Bilirubin Urine: NEGATIVE
GLUCOSE, UA: NEGATIVE mg/dL
Ketones, ur: 20 mg/dL — AB
Leukocytes, UA: NEGATIVE
Nitrite: NEGATIVE
PH: 5 (ref 5.0–8.0)
Specific Gravity, Urine: 1.022 (ref 1.005–1.030)

## 2018-03-20 LAB — BLOOD GAS, ARTERIAL
Acid-base deficit: 0.2 mmol/L (ref 0.0–2.0)
Bicarbonate: 24.8 mmol/L (ref 20.0–28.0)
FIO2: 0.8
MECHVT: 550 mL
O2 Saturation: 94.3 %
PEEP: 5 cmH2O
Patient temperature: 37
RATE: 20 resp/min
pCO2 arterial: 41 mmHg (ref 32.0–48.0)
pH, Arterial: 7.39 (ref 7.350–7.450)
pO2, Arterial: 73 mmHg — ABNORMAL LOW (ref 83.0–108.0)

## 2018-03-20 LAB — CBC WITH DIFFERENTIAL/PLATELET
ABS IMMATURE GRANULOCYTES: 0.06 10*3/uL (ref 0.00–0.07)
BASOS ABS: 0.1 10*3/uL (ref 0.0–0.1)
Basophils Relative: 1 %
Eosinophils Absolute: 0 10*3/uL (ref 0.0–0.5)
Eosinophils Relative: 0 %
HCT: 42.9 % (ref 39.0–52.0)
HEMOGLOBIN: 14.1 g/dL (ref 13.0–17.0)
IMMATURE GRANULOCYTES: 1 %
LYMPHS ABS: 1.4 10*3/uL (ref 0.7–4.0)
LYMPHS PCT: 14 %
MCH: 29.5 pg (ref 26.0–34.0)
MCHC: 32.9 g/dL (ref 30.0–36.0)
MCV: 89.7 fL (ref 80.0–100.0)
Monocytes Absolute: 1 10*3/uL (ref 0.1–1.0)
Monocytes Relative: 10 %
NEUTROS ABS: 7.3 10*3/uL (ref 1.7–7.7)
NEUTROS PCT: 74 %
NRBC: 0 % (ref 0.0–0.2)
Platelets: 173 10*3/uL (ref 150–400)
RBC: 4.78 MIL/uL (ref 4.22–5.81)
RDW: 14.1 % (ref 11.5–15.5)
WBC: 9.8 10*3/uL (ref 4.0–10.5)

## 2018-03-20 LAB — GLUCOSE, CAPILLARY
Glucose-Capillary: 177 mg/dL — ABNORMAL HIGH (ref 70–99)
Glucose-Capillary: 173 mg/dL — ABNORMAL HIGH (ref 70–99)
Glucose-Capillary: 187 mg/dL — ABNORMAL HIGH (ref 70–99)
Glucose-Capillary: 184 mg/dL — ABNORMAL HIGH (ref 70–99)
Glucose-Capillary: 165 mg/dL — ABNORMAL HIGH (ref 70–99)
Glucose-Capillary: 146 mg/dL — ABNORMAL HIGH (ref 70–99)
Glucose-Capillary: 139 mg/dL — ABNORMAL HIGH (ref 70–99)

## 2018-03-20 LAB — LACTIC ACID, PLASMA
LACTIC ACID, VENOUS: 1.2 mmol/L (ref 0.5–1.9)
LACTIC ACID, VENOUS: 1.3 mmol/L (ref 0.5–1.9)
LACTIC ACID, VENOUS: 1.4 mmol/L (ref 0.5–1.9)
Lactic Acid, Venous: 1.4 mmol/L (ref 0.5–1.9)

## 2018-03-20 LAB — TROPONIN I
TROPONIN I: 0.03 ng/mL — AB (ref <0.03)
Troponin I: 0.1 ng/mL (ref <0.03)
Troponin I: 0.06 ng/mL (ref <0.03)
Troponin I: <0.03 ng/mL (ref <0.03)

## 2018-03-20 LAB — STREP PNEUMONIAE URINARY ANTIGEN: Strep Pneumo Urinary Antigen: NEGATIVE

## 2018-03-20 LAB — BRAIN NATRIURETIC PEPTIDE: B Natriuretic Peptide: 292 pg/mL — ABNORMAL HIGH (ref 0.0–100.0)

## 2018-03-20 LAB — PROCALCITONIN: Procalcitonin: 1.95 ng/mL

## 2018-03-20 LAB — PROTIME-INR
INR: 1.76
PROTHROMBIN TIME: 20.3 s — AB (ref 11.4–15.2)

## 2018-03-20 LAB — TSH: TSH: 3.726 u[IU]/mL (ref 0.350–4.500)

## 2018-03-20 LAB — MRSA PCR SCREENING: MRSA by PCR: NEGATIVE

## 2018-03-20 MED ORDER — ENOXAPARIN SODIUM 120 MG/0.8ML ~~LOC~~ SOLN
1.0000 mg/kg | Freq: Two times a day (BID) | SUBCUTANEOUS | Status: DC
  Administered 2018-03-20 – 2018-03-21 (×3): 115 mg via SUBCUTANEOUS
  Filled 2018-03-20 (×4): qty 0.8

## 2018-03-20 MED ORDER — DILTIAZEM HCL ER COATED BEADS 120 MG PO CP24: 120.0000 mg | ORAL_CAPSULE | Freq: Every day | ORAL | Status: DC

## 2018-03-20 MED ORDER — INSULIN ASPART 100 UNIT/ML ~~LOC~~ SOLN
0.0000 [IU] | SUBCUTANEOUS | Status: DC
  Administered 2018-03-20: 1 [IU] via SUBCUTANEOUS
  Administered 2018-03-20 (×3): 2 [IU] via SUBCUTANEOUS
  Administered 2018-03-20: 1 [IU] via SUBCUTANEOUS
  Administered 2018-03-21: 2 [IU] via SUBCUTANEOUS
  Administered 2018-03-21: 3 [IU] via SUBCUTANEOUS
  Administered 2018-03-21: 1 [IU] via SUBCUTANEOUS
  Administered 2018-03-21 (×2): 2 [IU] via SUBCUTANEOUS
  Administered 2018-03-21: 1 [IU] via SUBCUTANEOUS
  Administered 2018-03-22: 2 [IU] via SUBCUTANEOUS
  Filled 2018-03-20 (×11): qty 1

## 2018-03-20 MED ORDER — SUCCINYLCHOLINE CHLORIDE 20 MG/ML IJ SOLN
200.0000 mg | Freq: Once | INTRAMUSCULAR | Status: AC
  Administered 2018-03-20: 200 mg via INTRAVENOUS

## 2018-03-20 MED ORDER — FENTANYL BOLUS VIA INFUSION
150.0000 ug | Freq: Once | INTRAVENOUS | Status: AC
  Administered 2018-03-20: 150 ug via INTRAVENOUS

## 2018-03-20 MED ORDER — ENOXAPARIN SODIUM 120 MG/0.8ML ~~LOC~~ SOLN
1.0000 mg/kg | Freq: Once | SUBCUTANEOUS | Status: AC
  Administered 2018-03-20: 115 mg via SUBCUTANEOUS
  Filled 2018-03-20: qty 0.8

## 2018-03-20 MED ORDER — LOSARTAN POTASSIUM 50 MG PO TABS: 25.0000 mg | ORAL_TABLET | Freq: Every day | ORAL | Status: DC

## 2018-03-20 MED ORDER — FUROSEMIDE 40 MG PO TABS: 60.0000 mg | ORAL_TABLET | Freq: Every day | ORAL | Status: DC

## 2018-03-20 MED ORDER — IPRATROPIUM-ALBUTEROL 0.5-2.5 (3) MG/3ML IN SOLN: 3.0000 mL | RESPIRATORY_TRACT | Status: DC | PRN

## 2018-03-20 MED ORDER — ONDANSETRON HCL 4 MG PO TABS: 4.0000 mg | ORAL_TABLET | Freq: Four times a day (QID) | ORAL | Status: DC | PRN

## 2018-03-20 MED ORDER — KETAMINE HCL 10 MG/ML IJ SOLN
200.0000 mg | Freq: Once | INTRAMUSCULAR | Status: AC
  Administered 2018-03-20: 200 mg via INTRAVENOUS

## 2018-03-20 MED ORDER — VANCOMYCIN HCL IN DEXTROSE 1-5 GM/200ML-% IV SOLN
1000.0000 mg | Freq: Once | INTRAVENOUS | Status: AC
  Administered 2018-03-20: 1000 mg via INTRAVENOUS
  Filled 2018-03-20: qty 200

## 2018-03-20 MED ORDER — FENTANYL 2500MCG IN NS 250ML (10MCG/ML) PREMIX INFUSION
0.0000 ug/h | INTRAVENOUS | Status: DC
  Administered 2018-03-20: 150 ug/h via INTRAVENOUS
  Administered 2018-03-20: 175 ug/h via INTRAVENOUS
  Administered 2018-03-20: 125 ug/h via INTRAVENOUS
  Filled 2018-03-20 (×2): qty 250

## 2018-03-20 MED ORDER — IPRATROPIUM-ALBUTEROL 0.5-2.5 (3) MG/3ML IN SOLN
3.0000 mL | Freq: Once | RESPIRATORY_TRACT | Status: AC
  Administered 2018-03-20: 3 mL via RESPIRATORY_TRACT
  Filled 2018-03-20: qty 3

## 2018-03-20 MED ORDER — FENTANYL CITRATE (PF) 100 MCG/2ML IJ SOLN
150.0000 ug | Freq: Once | INTRAMUSCULAR | Status: AC
  Administered 2018-03-20: 150 ug via INTRAVENOUS

## 2018-03-20 MED ORDER — ONDANSETRON HCL 4 MG/2ML IJ SOLN: 4.0000 mg | Freq: Four times a day (QID) | INTRAMUSCULAR | Status: DC | PRN

## 2018-03-20 MED ORDER — ALBUTEROL SULFATE (2.5 MG/3ML) 0.083% IN NEBU: 2.5000 mg | INHALATION_SOLUTION | RESPIRATORY_TRACT | Status: DC | PRN

## 2018-03-20 MED ORDER — PROPOFOL BOLUS VIA INFUSION
80.0000 mg | Freq: Once | INTRAVENOUS | Status: AC
  Administered 2018-03-20: 80 mg via INTRAVENOUS

## 2018-03-20 MED ORDER — SODIUM CHLORIDE 0.9 % IV SOLN
2.0000 g | INTRAVENOUS | Status: DC
  Administered 2018-03-21: 2 g via INTRAVENOUS
  Filled 2018-03-20: qty 2

## 2018-03-20 MED ORDER — ACETAMINOPHEN 325 MG PO TABS
650.0000 mg | ORAL_TABLET | Freq: Four times a day (QID) | ORAL | Status: DC | PRN
  Administered 2018-03-23: 650 mg via ORAL
  Filled 2018-03-20: qty 2

## 2018-03-20 MED ORDER — ADULT MULTIVITAMIN LIQUID CH
15.0000 mL | Freq: Every day | ORAL | Status: DC
  Administered 2018-03-20 – 2018-03-24 (×5): 15 mL
  Filled 2018-03-20 (×5): qty 15

## 2018-03-20 MED ORDER — PRO-STAT SUGAR FREE PO LIQD
60.0000 mL | Freq: Every day | ORAL | Status: DC
  Administered 2018-03-20 – 2018-03-24 (×21): 60 mL

## 2018-03-20 MED ORDER — ASPIRIN EC 81 MG PO TBEC: 81.0000 mg | DELAYED_RELEASE_TABLET | Freq: Every day | ORAL | Status: DC

## 2018-03-20 MED ORDER — FUROSEMIDE 20 MG PO TABS
20.0000 mg | ORAL_TABLET | Freq: Every day | ORAL | Status: DC
  Administered 2018-03-20: 20 mg via ORAL
  Filled 2018-03-20: qty 1

## 2018-03-20 MED ORDER — ORAL CARE MOUTH RINSE
15.0000 mL | OROMUCOSAL | Status: DC
  Administered 2018-03-20 – 2018-03-24 (×37): 15 mL via OROMUCOSAL

## 2018-03-20 MED ORDER — PROPOFOL 10 MG/ML IV BOLUS
80.0000 mg | Freq: Once | INTRAVENOUS | Status: AC
  Administered 2018-03-20: 80 mg via INTRAVENOUS

## 2018-03-20 MED ORDER — PROPOFOL 1000 MG/100ML IV EMUL
5.0000 ug/kg/min | Freq: Once | INTRAVENOUS | Status: AC
  Administered 2018-03-20: 20 ug/kg/min via INTRAVENOUS

## 2018-03-20 MED ORDER — DOCUSATE SODIUM 100 MG PO CAPS
100.0000 mg | ORAL_CAPSULE | Freq: Two times a day (BID) | ORAL | Status: DC
  Administered 2018-03-20: 100 mg via ORAL
  Filled 2018-03-20 (×3): qty 1

## 2018-03-20 MED ORDER — WARFARIN SODIUM 7.5 MG PO TABS: 7.5000 mg | ORAL_TABLET | Freq: Every day | ORAL | Status: DC

## 2018-03-20 MED ORDER — DICYCLOMINE HCL 10 MG PO CAPS: 10.0000 mg | ORAL_CAPSULE | Freq: Four times a day (QID) | ORAL | Status: DC | PRN

## 2018-03-20 MED ORDER — VANCOMYCIN HCL IN DEXTROSE 1-5 GM/200ML-% IV SOLN: 1000.0000 mg | Freq: Three times a day (TID) | INTRAVENOUS | Status: DC

## 2018-03-20 MED ORDER — SERTRALINE HCL 50 MG PO TABS: 50.0000 mg | ORAL_TABLET | Freq: Every morning | ORAL | Status: DC

## 2018-03-20 MED ORDER — FAMOTIDINE 20 MG PO TABS
20.0000 mg | ORAL_TABLET | Freq: Two times a day (BID) | ORAL | Status: DC
  Administered 2018-03-20 – 2018-03-24 (×8): 20 mg
  Filled 2018-03-20 (×8): qty 1

## 2018-03-20 MED ORDER — SODIUM CHLORIDE 0.9 % IV SOLN: 500.0000 mg | INTRAVENOUS | Status: DC

## 2018-03-20 MED ORDER — FAMOTIDINE IN NACL 20-0.9 MG/50ML-% IV SOLN
20.0000 mg | Freq: Two times a day (BID) | INTRAVENOUS | Status: DC
  Administered 2018-03-20: 20 mg via INTRAVENOUS
  Filled 2018-03-20: qty 50

## 2018-03-20 MED ORDER — VANCOMYCIN HCL IN DEXTROSE 1-5 GM/200ML-% IV SOLN
1000.0000 mg | Freq: Three times a day (TID) | INTRAVENOUS | Status: DC
  Filled 2018-03-20 (×4): qty 200

## 2018-03-20 MED ORDER — SODIUM CHLORIDE 0.9 % IV SOLN
1.0000 g | Freq: Once | INTRAVENOUS | Status: AC
  Administered 2018-03-20: 1 g via INTRAVENOUS
  Filled 2018-03-20: qty 10

## 2018-03-20 MED ORDER — PROPOFOL 1000 MG/100ML IV EMUL
INTRAVENOUS | Status: AC
  Filled 2018-03-20: qty 100

## 2018-03-20 MED ORDER — AZITHROMYCIN 500 MG PO TABS
500.0000 mg | ORAL_TABLET | Freq: Once | ORAL | Status: AC
  Administered 2018-03-20: 500 mg via ORAL
  Filled 2018-03-20: qty 1

## 2018-03-20 MED ORDER — INSULIN ASPART 100 UNIT/ML ~~LOC~~ SOLN: 0.0000 [IU] | Freq: Every day | SUBCUTANEOUS | Status: DC

## 2018-03-20 MED ORDER — VITAL HIGH PROTEIN PO LIQD
1000.0000 mL | ORAL | Status: DC
  Administered 2018-03-20 – 2018-03-23 (×5): 1000 mL

## 2018-03-20 MED ORDER — CHLORHEXIDINE GLUCONATE 0.12% ORAL RINSE (MEDLINE KIT)
15.0000 mL | Freq: Two times a day (BID) | OROMUCOSAL | Status: DC
  Administered 2018-03-20 – 2018-03-24 (×8): 15 mL via OROMUCOSAL

## 2018-03-20 MED ORDER — ASPIRIN 81 MG PO CHEW
81.0000 mg | CHEWABLE_TABLET | Freq: Every day | ORAL | Status: DC
  Administered 2018-03-20 – 2018-03-24 (×5): 81 mg
  Filled 2018-03-20 (×6): qty 1

## 2018-03-20 MED ORDER — OSELTAMIVIR PHOSPHATE 75 MG PO CAPS
75.0000 mg | ORAL_CAPSULE | Freq: Two times a day (BID) | ORAL | Status: AC
  Administered 2018-03-20 – 2018-03-22 (×5): 75 mg via ORAL
  Filled 2018-03-20 (×6): qty 1

## 2018-03-20 MED ORDER — PROPOFOL 1000 MG/100ML IV EMUL
5.0000 ug/kg/min | INTRAVENOUS | Status: DC
  Administered 2018-03-20: 35 ug/kg/min via INTRAVENOUS
  Administered 2018-03-20: 50 ug/kg/min via INTRAVENOUS
  Administered 2018-03-20: 30 ug/kg/min via INTRAVENOUS
  Administered 2018-03-20 (×2): 50 ug/kg/min via INTRAVENOUS
  Administered 2018-03-21 (×2): 45 ug/kg/min via INTRAVENOUS
  Administered 2018-03-21: 70 ug/kg/min via INTRAVENOUS
  Filled 2018-03-20 (×10): qty 100

## 2018-03-20 MED ORDER — HEPARIN SODIUM (PORCINE) 5000 UNIT/ML IJ SOLN: 5000.0000 [IU] | Freq: Three times a day (TID) | INTRAMUSCULAR | Status: DC

## 2018-03-20 MED ORDER — OSELTAMIVIR PHOSPHATE 75 MG PO CAPS: 75.0000 mg | ORAL_CAPSULE | Freq: Once | ORAL | Status: DC

## 2018-03-20 MED ORDER — PERFLUTREN LIPID MICROSPHERE
1.0000 mL | INTRAVENOUS | Status: AC | PRN
  Administered 2018-03-20: 3 mL via INTRAVENOUS
  Filled 2018-03-20: qty 10

## 2018-03-20 MED ORDER — BUPROPION HCL ER (SR) 100 MG PO TB12
200.0000 mg | ORAL_TABLET | Freq: Two times a day (BID) | ORAL | Status: DC
  Administered 2018-03-20 – 2018-03-21 (×3): 200 mg via ORAL
  Filled 2018-03-20 (×4): qty 2

## 2018-03-20 MED ORDER — MAGNESIUM SULFATE 2 GM/50ML IV SOLN
2.0000 g | Freq: Once | INTRAVENOUS | Status: AC
  Administered 2018-03-20: 2 g via INTRAVENOUS
  Filled 2018-03-20: qty 50

## 2018-03-20 MED ORDER — SODIUM CHLORIDE 0.9 % IV SOLN
500.0000 mg | INTRAVENOUS | Status: AC
  Administered 2018-03-21 – 2018-03-22 (×2): 500 mg via INTRAVENOUS
  Filled 2018-03-20 (×2): qty 500

## 2018-03-20 MED ORDER — SODIUM CHLORIDE 0.9 % IV BOLUS
1000.0000 mL | Freq: Once | INTRAVENOUS | Status: AC
  Administered 2018-03-20: 1000 mL via INTRAVENOUS

## 2018-03-20 MED ORDER — FENTANYL 2500MCG IN NS 250ML (10MCG/ML) PREMIX INFUSION
INTRAVENOUS | Status: AC
  Filled 2018-03-20: qty 250

## 2018-03-20 MED ORDER — ACETAMINOPHEN 650 MG RE SUPP
650.0000 mg | Freq: Four times a day (QID) | RECTAL | Status: DC | PRN
  Administered 2018-03-21: 650 mg via RECTAL
  Filled 2018-03-20: qty 1

## 2018-03-20 MED ORDER — OLANZAPINE 10 MG PO TABS: 20.0000 mg | ORAL_TABLET | Freq: Every day | ORAL | Status: DC

## 2018-03-20 NOTE — ED Notes (Signed)
MD, RT, RN at bedside to prepare for intubation

## 2018-03-20 NOTE — ED Provider Notes (Signed)
 [facility] Emergency Department Provider Note  ____________________________________________   First MD Initiated Contact with Patient 03/20/18 0230     (approximate)  I have reviewed the triage vital signs and the nursing notes.   HISTORY  Chief Complaint Shortness of Breath   HPI Nicholas Horton is a 42 y.o. male who comes to the emergency department via EMS with 2 to 3 days of increasing shortness of breath and fever.  Today he went to urgent care where he was diagnosed with influenza A and was prescribed Tamiflu.  He did take his first dose however over the course of the afternoon and evening his shortness of breath worsened and his fiance called 911.  EMS noted that he was 103.3 degrees prior to coming to the hospital.  He was given 2 DuoNeb's en route with minimal improvement in his symptoms.  The patient has a complex past medical history including congestive heart failure with a last known ejection fraction around 25%.  This is secondary to congenital bicuspid aortic valve which is been replaced and he takes Coumadin.  He denies history of COPD.  His symptoms came on gradually are slowly progressive now severe.  They are worse with exertion and somewhat improved with rest although he now has significant cough and shortness of breath at rest as well.    Past Medical History:  Diagnosis Date  . CHF (congestive heart failure) (HCC)   . Congenital bicuspid aortic valve 09/2014   - s/p AVR  . Nonischemic cardiomyopathy (HCC) -- Resolved[I42.9] 09/2014   EF by Echo 20-25% (pre-op AVR) --> Echo 10/2014 & 03/2015: EF 50-55% - also Recent Normal Diastolic parameters   . S/P AVR (aortic valve replacement) 10/2014   21 mm Carbometrics Mechanical Valve -- Epicardial PPM Leads  . Severe aortic stenosis 10/04/2014   Presented with Syncope & CHF    Patient Active Problem List   Diagnosis Date Noted  . Acute respiratory failure with hypoxemia (HCC) 03/20/2018  . ADHD  (attention deficit hyperactivity disorder) 05/14/2017  . Type 2 diabetes mellitus with other specified complication (HCC) 05/14/2017  . Schizoaffective disorder (HCC) 05/14/2017  . Moderate persistent asthma 01/12/2017  . OCD (obsessive compulsive disorder) 01/12/2017  . Major depressive disorder, recurrent (HCC) 09/29/2016  . Warfarin anticoagulation 09/29/2016  . Dyslipidemia 07/13/2016  . Obesity (BMI 30.0-34.9) 07/07/2016  . Heart palpitations 07/07/2015  . Chronic diastolic heart failure (HCC) 04/14/2015  . Encounter for therapeutic drug monitoring 12/16/2014  . S/P AVR 12/01/2014  . Complete heart block (HCC) 11/23/2014  . Syncope 11/21/2014  . Paradentosis 11/21/2014  . Cardiac pacemaker in situ 11/12/2014  . Tobacco use disorder 10/07/2014  . History of non-ST elevation myocardial infarction (NSTEMI) 10/04/2014  . Dyspnea on exertion 10/04/2014  . Moderate to severe AS with bicuspid valve. Mean 33, peak 61 mmHg. 10/04/2014    Past Surgical History:  Procedure Laterality Date  . AORTIC VALVE REPLACEMENT N/A 12/01/2014   Procedure: AORTIC VALVE REPLACEMENT (AVR);  Surgeon: Peter Van Trigt, MD;  Location: [facility] OR;  Service: Open Heart Surgery;  Laterality: N/A;  . CARDIAC CATHETERIZATION N/A 10/04/2014   Procedure: Left Heart Cath and Coronary Angiography;  Surgeon: David W Harding, MD;  Location: [facility] INVASIVE CV LAB;  Service: Cardiovascular;  Laterality: N/A;  . CARDIAC CATHETERIZATION  10/04/2014   Procedure: Temporary Pacemaker;  Surgeon: David W Harding, MD;  Location: [facility] INVASIVE CV LAB;  Service: Cardiovascular;;  . CARDIAC CATHETERIZATION N/A 11/25/2014   Procedure: Temporary   Pacemaker;  Surgeon: Evans Lance, MD;  Location: [place] CV LAB;  Service: Cardiovascular;  Laterality: N/A;  . MULTIPLE EXTRACTIONS WITH ALVEOLOPLASTY N/A 11/25/2014   Procedure: Extraction of tooth #'s 1,2,3,4,5,6,7,9, 10, 11, 12,13,14,16, 19, 20, 21, 22, 23, 24, 25, 26, 27, 28, 29, 30, 31 with  alveoloplasty;  Surgeon: Lenn Cal, DDS;  Location: [place];  Service: Oral Surgery;  Laterality: N/A;  . SURGERY SCROTAL / TESTICULAR     for undescended testicle as a child  . TEE WITHOUT CARDIOVERSION N/A 12/01/2014   Procedure: TRANSESOPHAGEAL ECHOCARDIOGRAM (TEE);  Surgeon: Ivin Poot, MD;  Location: [place];  Service: Open Heart Surgery;  Laterality: N/A;  . TRANSTHORACIC ECHOCARDIOGRAM  03/2015   Mildly dilated LV. EF 50 at 55%. Normal diastolic parameters. Mild to moderate stenosis of prosthetic aortic valve. Mild to moderate regurgitation  . TRANSTHORACIC ECHOCARDIOGRAM  08/2017   Over-read - EF ~50% with PPM dyssynchrony.  Mild AS.  mean gradient 24 mmHg.     Prior to Admission medications   Medication Sig Start Date End Date Taking? Authorizing Provider  albuterol (PROVENTIL HFA;VENTOLIN HFA) 108 (90 Base) MCG/ACT inhaler Inhale 2 puffs into the lungs every 6 (six) hours as needed for wheezing or shortness of breath. 11/23/17  Yes Loletha Grayer, MD  aspirin EC 81 MG EC tablet Take 1 tablet (81 mg total) by mouth daily. 10/07/14  Yes Emokpae, Ejiroghene E, MD  buPROPion (WELLBUTRIN SR) 200 MG 12 hr tablet Take 1 tablet (200 mg total) by mouth 2 (two) times daily. 12/24/17  Yes Karamalegos, Devonne Doughty, DO  dicyclomine (BENTYL) 10 MG capsule Take 10 mg by mouth 4 (four) times daily as needed. 11/27/17  Yes [provider]  diltiazem (CARDIZEM CD) 120 MG 24 hr capsule Take 1 capsule (120 mg total) by mouth daily. 03/04/18 06/02/18 Yes Leonie Man, MD  furosemide (LASIX) 40 MG tablet Take one tablet by mouth daily except mondays and thursdays take two tablet  and may take an extra one dose additional if needed Patient taking differently: Take 40 mg by mouth as directed. Take one tablet by mouth daily except mondays and thursdays take two tablet  and may take an extra one dose additional if needed 03/04/18  Yes Leonie Man, MD  Glycopyrrolate-Formoterol (BEVESPI  AEROSPHERE IN) Inhale 2 puffs into the lungs 2 (two) times daily.   Yes [provider]  losartan (COZAAR) 25 MG tablet Take 1 tablet (25 mg total) by mouth daily. 12/18/17  Yes Karamalegos, Devonne Doughty, DO  metFORMIN (GLUCOPHAGE) 1000 MG tablet Take 1 tablet (1,000 mg total) by mouth 2 (two) times daily. 12/18/17  Yes Karamalegos, Alexander J, DO  OLANZapine (ZYPREXA) 20 MG tablet Take 20 mg by mouth daily. 02/20/18  Yes [provider]  oseltamivir (TAMIFLU) 75 MG capsule Take 1 capsule (75 mg total) by mouth 2 (two) times daily. For 5 days 03/18/18  Yes Karamalegos, Devonne Doughty, DO  sertraline (ZOLOFT) 50 MG tablet Take 50 mg by mouth every morning. 12/13/17  Yes [provider]  warfarin (COUMADIN) 5 MG tablet Take 1.5 tablets (7.5 mg total) by mouth daily. 02/28/18  Yes Karamalegos, Devonne Doughty, DO  amoxicillin (AMOXIL) 500 MG capsule Take 2 capsules (1,000 mg total) by mouth as needed. With any  Procedures. 03/04/18   Leonie Man, MD  Blood Glucose Monitoring Suppl (ONE TOUCH ULTRA 2) w/Device KIT See admin instructions. 12/05/17   [provider]  OLANZapine (ZYPREXA)  2.5 MG tablet Take 8 tablets (20 mg total) by mouth at bedtime. Patient not taking: Reported on 03/20/2018 11/23/17 11/23/18  Loletha Grayer, MD    Allergies Other  Family History  Problem Relation Age of Onset  . CAD Neg Hx   . Hypertension Neg Hx     Social History Social History   Tobacco Use  . Smoking status: Former Smoker    Packs/day: 2.00    Years: 20.00    Pack years: 40.00    Types: Cigarettes  . Smokeless tobacco: Former Systems developer  . Tobacco comment: quit 10/04/14  Substance Use Topics  . Alcohol use: No    Alcohol/week: 0.0 standard drinks    Comment: rarely  . Drug use: No    Review of Systems Constitutional: Positive for fevers and chills Eyes: No visual changes. ENT: No sore throat. Cardiovascular: Positive for chest pain. Respiratory: Positive for shortness  of breath. Gastrointestinal: No abdominal pain.  No nausea, no vomiting.  No diarrhea.  No constipation. Genitourinary: Negative for dysuria. Musculoskeletal: Negative for back pain. Skin: Negative for rash. Neurological: Positive for headache   ____________________________________________   PHYSICAL EXAM:  VITAL SIGNS: ED Triage Vitals  Enc Vitals Group     BP      Pulse      Resp      Temp      Temp src      SpO2      Weight      Height      Head Circumference      Peak Flow      Pain Score      Pain Loc      Pain Edu?      Excl. in [place]?     Constitutional: The patient arrives critically ill appearing with elevated respiratory rate speaking in short sentences using accessory muscles and diaphoretic Eyes: PERRL EOMI. Head: Atraumatic. Nose: No congestion/rhinnorhea. Mouth/Throat: No trismus Neck: No stridor.   Cardiovascular: Tachycardic but regular rate.  Mechanical heart sounds best heard the right sternal border Respiratory: Moderate to severe respiratory distress using accessory muscles with subcostal retractions.  Decreased breath sounds on the right however wheezing bilaterally particularly at the bases. Gastrointestinal: Obese soft nontender Musculoskeletal: Legs equal in size Neurologic:  No gross focal neurologic deficits are appreciated. Skin: Diaphoretic Psychiatric: Anxious appearing   ____________________________________________   DIFFERENTIAL includes but not limited to  Influenza, ARDS, pneumonia, pneumothorax, pulmonary embolism ____________________________________________   LABS (all labs ordered are listed, but only abnormal results are displayed)  Labs Reviewed  BLOOD GAS, VENOUS - Abnormal; Notable for the following components:      Result Value   pH, Ven 7.51 (*)    pCO2, Ven 33 (*)    pO2, Ven 50.0 (*)    Acid-Base Excess 3.7 (*)    All other components within normal limits  COMPREHENSIVE METABOLIC PANEL - Abnormal; Notable for  the following components:   Glucose, Bld 146 (*)    Calcium 8.2 (*)    Total Bilirubin 1.4 (*)    All other components within normal limits  BRAIN NATRIURETIC PEPTIDE - Abnormal; Notable for the following components:   B Natriuretic Peptide 292.0 (*)    All other components within normal limits  TROPONIN I - Abnormal; Notable for the following components:   Troponin I 0.10 (*)    All other components within normal limits  PROTIME-INR - Abnormal; Notable for the following components:   Prothrombin Time 20.3 (*)  All other components within normal limits  BLOOD GAS, ARTERIAL - Abnormal; Notable for the following components:   pO2, Arterial 73 (*)    All other components within normal limits  GLUCOSE, CAPILLARY - Abnormal; Notable for the following components:   Glucose-Capillary 177 (*)    All other components within normal limits  GLUCOSE, CAPILLARY - Abnormal; Notable for the following components:   Glucose-Capillary 173 (*)    All other components within normal limits  MRSA PCR SCREENING  CULTURE, RESPIRATORY  CULTURE, BLOOD (ROUTINE X 2)  CULTURE, BLOOD (ROUTINE X 2)  CBC WITH DIFFERENTIAL/PLATELET  LACTIC ACID, PLASMA  LACTIC ACID, PLASMA  TSH  PROCALCITONIN  TROPONIN I  TROPONIN I  TROPONIN I  LACTIC ACID, PLASMA  LACTIC ACID, PLASMA  STREP PNEUMONIAE URINARY ANTIGEN  LEGIONELLA PNEUMOPHILA SEROGP 1 UR AG    Lab work reviewed by me shows acute respiratory alkalosis consistent with elevated respiratory rate.  Troponin and BNP are elevated consistent with stretch and likely not primary myocardial ischemia __________________________________________  EKG  ED ECG REPORT I,  , the attending physician, personally viewed and interpreted this ECG.  Date: 03/20/2018 Paced rhythm at 127.  Normal repolarization abnormalities with no signs of acute ischemia.  Abnormal EKG  ____________________________________________  RADIOLOGY  First chest x-ray reviewed  by me shows right middle lobe consolidation consistent with pneumonia Second chest x-ray reviewed by me shows worsening interstitial edema however endotracheal tube and orogastric tube are in good position ____________________________________________   PROCEDURES  Procedure(s) performed: Yes  .Critical Care Performed by: , , MD Authorized by: , , MD   Critical care provider statement:    Critical care time (minutes):  65   Critical care time was exclusive of:  Separately billable procedures and treating other patients   Critical care was necessary to treat or prevent imminent or life-threatening deterioration of the following conditions:  Sepsis and respiratory failure   Critical care was time spent personally by me on the following activities:  Development of treatment plan with patient or surrogate, discussions with consultants, evaluation of patient's response to treatment, examination of patient, obtaining history from patient or surrogate, ordering and performing treatments and interventions, ordering and review of laboratory studies, ordering and review of radiographic studies, pulse oximetry, re-evaluation of patient's condition and review of old charts Procedure Name: Intubation Date/Time: 03/20/2018 7:34 AM Performed by: , , MD Pre-anesthesia Checklist: Patient identified, Patient being monitored, Emergency Drugs available, Timeout performed and Suction available Oxygen Delivery Method: Nasal cannula Preoxygenation: Pre-oxygenation with 100% oxygen Induction Type: Rapid sequence Ventilation: Mask ventilation without difficulty Laryngoscope Size: Mac and 4 Grade View: Grade I Tube size: 7.5 mm Number of attempts: 1 Placement Confirmation: ETT inserted through vocal cords under direct vision,  CO2 detector and Breath sounds checked- equal and bilateral Secured at: 23 cm Tube secured with: ETT holder Comments: Large amount of purulent  material coming from the cords during intubation       Critical Care performed: Yes  ____________________________________________   INITIAL IMPRESSION / ASSESSMENT AND PLAN / ED COURSE  Pertinent labs & imaging results that were available during my care of the patient were reviewed by me and considered in my medical decision making (see chart for details).   As part of my medical decision making, I reviewed the following data within the electronic medical record:  History obtained from family if available, nursing notes, old chart and ekg, as well as notes from prior ED visits.  The   patient arrives to the emergency department hypoxic to the 80s on room air however saturating in the high 80s low 90s on 2 L nasal cannula.  I reviewed his urgent care visit from earlier today and saw that he is in fact influenza A positive.  He has a complex past medical history including severe congestive heart failure which makes aggressive fluid resuscitation difficult.  Portable chest x-ray obtained and shows right middle lobe pneumonia.  In the setting of influenza though this is highly concerning for MRSA.  He already received his first dose of Tamiflu today so I will add on ceftriaxone and azithromycin and vancomycin.  After 1 L of fluids the patient's heart rate has only minimally changed.  Tylenol given to help defervesced and he declines ibuprofen secondary to his anticoagulation.  Over the first hour that the patient was in the emergency department he quite rapidly deteriorated with his oxygen requirements going up from 2 L to 4 L to 6 L.  He is quite short of breath with a respiratory rate climbing to near 40.  I had multiple lengthy discussions with the patient regarding the severity of his illness and he understands that there is a chance that he could not survive.  He declines BiPAP stating it gives him too much anxiety and he cannot tolerate it.  I recommended endotracheal intubation to help offload the  metabolic demands of his sepsis and the patient agreed.  He wanted to wait for his fiance to come and together the 3 of us all agreed that intubation was in his best interest.  I intubated him without complication however he quite rapidly desaturated to around 60%.  There was purulent material from between his vocal cords during intubation.  He was initially a little bit difficult to get comfortable on the vent however with propofol and fentanyl he did settle down.  I discussed the case with the hospitalist Dr. diamond who has graciously agreed to admit the patient to his service.  The patient and his fiance understand that he is critically ill.      ____________________________________________   FINAL CLINICAL IMPRESSION(S) / ED DIAGNOSES  Final diagnoses:  Influenza  Lobar pneumonia (HCC)  Acute respiratory failure with hypoxia (HCC)  Sepsis, due to unspecified organism, unspecified whether acute organ dysfunction present (HCC)      NEW MEDICATIONS STARTED DURING THIS VISIT:  Current Discharge Medication List       Note:  This document was prepared using Dragon voice recognition software and may include unintentional dictation errors.     , , MD 03/20/18 0738  

## 2018-03-20 NOTE — Consult Note (Signed)
Pharmacy Antibiotic Note  Nicholas Horton is a 42 y.o. male admitted on 03/20/2018 with pneumonia.  Pharmacy has been consulted for Ceftriaxone dosing.Patient also is influenza A +. Patient is currently intubated.  Plan: Continue Ceftriaxone 2 g IV q24h due to severity of infection, age, body habitus. Continue Azithromycin 500 mg Q24 x 2 days; patient received Azithromycin 500 PO x 1.   Discontinued Vancomycin per CCM rounds' discussion.  Patient is receiving Tamiflu 75 mg PO BID.  Height: 6\' 2"  (188 cm) Weight: 248 lb 0.3 oz (112.5 kg) IBW/kg (Calculated) : 82.2  Temp (24hrs), Avg:99 F (37.2 C), Min:96.4 F (35.8 C), Max:101.7 F (38.7 C)  Recent Labs  Lab 03/20/18 0235 03/20/18 0302 03/20/18 0444 03/20/18 0717 03/20/18 0915  WBC 9.8  --   --   --   --   CREATININE 1.05  --   --   --   --   LATICACIDVEN  --  1.2 1.3 1.4 1.4    Estimated Creatinine Clearance: 123.5 mL/min (by C-G formula based on SCr of 1.05 mg/dL).    Allergies  Allergen Reactions  . Other     No NSAIDS r/t blood thinners    Antimicrobials this admission: 10/23 Azithromycin >>  10/23 Ceftriaxone >>  10/23 Vancomycin x 1   Dose adjustments this admission: N/A  Microbiology results: 10/23 BCx: pending 10/23 Respiratory Moderate WBCs, no organisms  10/23 MRSA PCR: (-)  Thank you for allowing pharmacy to be a part of this patient's care.   Mauri Reading, PharmD Pharmacy Resident  03/20/2018 2:02 PM

## 2018-03-20 NOTE — Progress Notes (Signed)
Initial Nutrition Assessment  DOCUMENTATION CODES:   Obesity unspecified  INTERVENTION:  Provide Vital High Protein at 10 mL/hr (240 mL goal daily volume) + Pro-Stat 60 mL 5 times daily via OGT. Provides 1240 kcal, 171 grams of protein, 202 mL H2O daily. With current propofol rate provides 2127 kcal daily.  Provide liquid MVI daily per tube.  Provide free water flush of 30 mL Q4hrs to maintain tube patency.  NUTRITION DIAGNOSIS:   Inadequate oral intake related to inability to eat as evidenced by NPO status.  GOAL:   Provide needs based on ASPEN/SCCM guidelines  MONITOR:   Vent status, Labs, Weight trends, TF tolerance, I & O's  REASON FOR ASSESSMENT:   Ventilator    ASSESSMENT:   42 year old male with PMHx of congenital bicuspid aortic valve s/p AVR, severe aortic stenosis, CHF admitted with acute hypoxic respiratory failure requiring intubation on 10/23 in setting of influenza A and PNA.   Patient intubated and sedated. On PRVC mode with FiO2 60% and PEEP 5 cmH2O. Abdomen soft. Per chart weight has fluctuated between 112.3-119.7 kg this year. Fluctuation likely related to fluid.  Patient is currently 112.5 kg (248.02 lbs).  Enteral Access: 18 Fr. OGT placed 10/23; terminates below level of the diaphragm per chest x-ray 10/23; 60 cm at corner of mouth  MAP: 64-95 mmHg  Patient is currently intubated on ventilator support Ve: 9.7 L/min Temp (24hrs), Avg:98.5 F (36.9 C), Min:96.3 F (35.7 C), Max:101.7 F (38.7 C)  Propofol: 33.6 mL/hr (887 kcal daily)  Medications reviewed and include: famotidine 20 mg BID per tube, Lasix 20 mg daily, Novolog 0-9 units Q4hrs, Novolog 0-5 units QHS, Tamiflu, azithromycin, ceftriaxone, fentanyl gtt, propofol gtt.  Labs reviewed: CBG 184.  I/O: 200 mL UOP so far this shift  Patient does not meet criteria for malnutrition at this time.  Discussed with RN and on rounds. Plan is to start tube feeds today.  NUTRITION - FOCUSED  PHYSICAL EXAM:    Most Recent Value  Orbital Region  No depletion  Upper Arm Region  No depletion  Thoracic and Lumbar Region  No depletion  Buccal Region  Unable to assess  Temple Region  No depletion  Clavicle Bone Region  No depletion  Clavicle and Acromion Bone Region  No depletion  Scapular Bone Region  Unable to assess  Dorsal Hand  No depletion  Patellar Region  No depletion  Anterior Thigh Region  No depletion  Posterior Calf Region  No depletion  Edema (RD Assessment)  None  Hair  Reviewed  Eyes  Unable to assess  Mouth  Unable to assess  Skin  Reviewed  Nails  Reviewed     Diet Order:   Diet Order            Diet NPO time specified  Diet effective now              EDUCATION NEEDS:   No education needs have been identified at this time  Skin:  Skin Assessment: Reviewed RN Assessment  Last BM:  Unknown/PTA  Height:   Ht Readings from Last 1 Encounters:  03/20/18 6\' 2"  (1.88 m)    Weight:   Wt Readings from Last 1 Encounters:  03/20/18 112.5 kg    Ideal Body Weight:  86.4 kg  BMI:  Body mass index is 31.84 kg/m.  Estimated Nutritional Needs:   Kcal:  4496-7591 (11-14 kcal/kg)  Protein:  173 grams (2 grams/kg IBW)  Fluid:  1.5-1.8  L/day  Willey Blade, MS, RD, LDN Office: [phone] Pager: [phone] After Hours/Weekend Pager: [phone]

## 2018-03-20 NOTE — Progress Notes (Signed)
Pharmacy Antibiotic Note  Nicholas Horton is a 42 y.o. male admitted on 03/20/2018 with pneumonia.  Pharmacy has been consulted for vancomycin dosing. Patient received vanc 1g IV x 1 in ED  Plan: Will continue vanc 1g IV q8h w/ 6 hour stack  Will draw vanc trough 10/24 @ 0800 prior to 4th dose. Patient is also on ceftriaxone 1g and azithromycin 500 mg IV daily.  Ke 0.107  T1/2 ~ 8 hrs Goal trough 15 - 20 mcg/mL  Height: 6\' 2"  (188 cm) Weight: 247 lb (112 kg) IBW/kg (Calculated) : 82.2  Temp (24hrs), Avg:100.2 F (37.9 C), Min:100.2 F (37.9 C), Max:100.2 F (37.9 C)  Recent Labs  Lab 03/20/18 0235 03/20/18 0302  WBC 9.8  --   CREATININE 1.05  --   LATICACIDVEN  --  1.2    Estimated Creatinine Clearance: 123.2 mL/min (by C-G formula based on SCr of 1.05 mg/dL).    Allergies  Allergen Reactions  . Other     No NSAIDS r/t blood thinners    Thank you for allowing pharmacy to be a part of this patient's care.  Thomasene Ripple, PharmD, BCPS Clinical Pharmacist 03/20/2018

## 2018-03-20 NOTE — H&P (Addendum)
Nicholas Horton is an 42 y.o. male.   Chief Complaint: Shortness of breath HPI: Patient with past medical history of nonischemic congestive heart failure with an EF of 20 to 25% status post pacemaker and AICD placement presents emergency department with shortness of breath.  The patient was recently diagnosed with the flu.  He has had cough and fever as high as 103 F.  He was supposed to see his cardiologist today but was told to come back when he was feeling better.  Unfortunately shortness of breath became progressively worse.  Upon arrival to the emergency department oxygen saturations were 87% on room air.  His companion reports that O2 sats upon EMS arrival were 79% on room air and that his lips were blue.  Chest x-ray showed near total consolidation of the right middle lobe.  He was initially placed on 2 L of oxygen via nasal cannula but quickly required up to 6 L of oxygen to maintain saturations greater than 92%.  Eventually his work of breathing necessitated intubation and mechanical ventilation after which the emergency department staff called the hospitalist service for admission.  Past Medical History:  Diagnosis Date  . CHF (congestive heart failure) ([place])   . Congenital bicuspid aortic valve 09/2014   - s/p AVR  . Nonischemic cardiomyopathy ([place]) -- Resolved[I42.9] 09/2014   EF by Echo 20-25% (pre-op AVR) --> Echo 10/2014 & 03/2015: EF 50-55% - also Recent Normal Diastolic parameters   . S/P AVR (aortic valve replacement) 10/2014   21 mm Carbometrics Mechanical Valve -- Epicardial PPM Leads  . Severe aortic stenosis 10/04/2014   Presented with Syncope & CHF    Past Surgical History:  Procedure Laterality Date  . AORTIC VALVE REPLACEMENT N/A 12/01/2014   Procedure: AORTIC VALVE REPLACEMENT (AVR);  Surgeon: Ivin Poot, MD;  Location: [facility];  Service: Open Heart Surgery;  Laterality: N/A;  . CARDIAC CATHETERIZATION N/A 10/04/2014   Procedure: Left Heart Cath and Coronary Angiography;   Surgeon: Leonie Man, MD;  Location: [facility];  Service: Cardiovascular;  Laterality: N/A;  . CARDIAC CATHETERIZATION  10/04/2014   Procedure: Temporary Pacemaker;  Surgeon: Leonie Man, MD;  Location: [facility];  Service: Cardiovascular;;  . CARDIAC CATHETERIZATION N/A 11/25/2014   Procedure: Temporary Pacemaker;  Surgeon: Evans Lance, MD;  Location: [facility];  Service: Cardiovascular;  Laterality: N/A;  . MULTIPLE EXTRACTIONS WITH ALVEOLOPLASTY N/A 11/25/2014   Procedure: Extraction of tooth #'s 1,2,3,4,5,6,7,9, 10, 11, 12,13,14,16, 19, 20, 21, 22, 23, 24, 25, 26, 27, 28, 29, 30, 31 with alveoloplasty;  Surgeon: Lenn Cal, DDS;  Location: [facility];  Service: Oral Surgery;  Laterality: N/A;  . SURGERY SCROTAL / TESTICULAR     for undescended testicle as a child  . TEE WITHOUT CARDIOVERSION N/A 12/01/2014   Procedure: TRANSESOPHAGEAL ECHOCARDIOGRAM (TEE);  Surgeon: Ivin Poot, MD;  Location: [facility];  Service: Open Heart Surgery;  Laterality: N/A;  . TRANSTHORACIC ECHOCARDIOGRAM  03/2015   Mildly dilated LV. EF 50 at 55%. Normal diastolic parameters. Mild to moderate stenosis of prosthetic aortic valve. Mild to moderate regurgitation  . TRANSTHORACIC ECHOCARDIOGRAM  08/2017   Over-read - EF ~50% with PPM dyssynchrony.  Mild AS.  mean gradient 24 mmHg.     Family History  Problem Relation Age of Onset  . CAD Neg Hx   . Hypertension Neg Hx    Social History:  reports that he has quit smoking. His smoking  use included cigarettes. He has a 40.00 pack-year smoking history. He has quit using smokeless tobacco. He reports that he does not drink alcohol or use drugs.  Allergies:  Allergies  Allergen Reactions  . Other     No NSAIDS r/t blood thinners    Medications Prior to Admission  Medication Sig Dispense Refill  . albuterol (PROVENTIL HFA;VENTOLIN HFA) 108 (90 Base) MCG/ACT inhaler Inhale 2 puffs into the lungs every 6 (six) hours as needed for  wheezing or shortness of breath. 1 Inhaler 0  . aspirin EC 81 MG EC tablet Take 1 tablet (81 mg total) by mouth daily. 30 tablet 1  . buPROPion (WELLBUTRIN SR) 200 MG 12 hr tablet Take 1 tablet (200 mg total) by mouth 2 (two) times daily. 180 tablet 0  . dicyclomine (BENTYL) 10 MG capsule Take 10 mg by mouth 4 (four) times daily as needed.  1  . diltiazem (CARDIZEM CD) 120 MG 24 hr capsule Take 1 capsule (120 mg total) by mouth daily. 30 capsule 11  . furosemide (LASIX) 40 MG tablet Take one tablet by mouth daily except mondays and thursdays take two tablet  and may take an extra one dose additional if needed (Patient taking differently: Take 40 mg by mouth as directed. Take one tablet by mouth daily except mondays and thursdays take two tablet  and may take an extra one dose additional if needed) 70 tablet 11  . Glycopyrrolate-Formoterol (BEVESPI AEROSPHERE IN) Inhale 2 puffs into the lungs 2 (two) times daily.    Marland Kitchen losartan (COZAAR) 25 MG tablet Take 1 tablet (25 mg total) by mouth daily. 90 tablet 3  . metFORMIN (GLUCOPHAGE) 1000 MG tablet Take 1 tablet (1,000 mg total) by mouth 2 (two) times daily. 90 tablet 3  . OLANZapine (ZYPREXA) 20 MG tablet Take 20 mg by mouth daily.  1  . oseltamivir (TAMIFLU) 75 MG capsule Take 1 capsule (75 mg total) by mouth 2 (two) times daily. For 5 days 10 capsule 0  . sertraline (ZOLOFT) 50 MG tablet Take 50 mg by mouth every morning.  1  . warfarin (COUMADIN) 5 MG tablet Take 1.5 tablets (7.5 mg total) by mouth daily. 45 tablet 5  . amoxicillin (AMOXIL) 500 MG capsule Take 2 capsules (1,000 mg total) by mouth as needed. With any  Procedures. 2 capsule 6  . Blood Glucose Monitoring Suppl (ONE TOUCH ULTRA 2) w/Device KIT See admin instructions.  0  . OLANZapine (ZYPREXA) 2.5 MG tablet Take 8 tablets (20 mg total) by mouth at bedtime. (Patient not taking: Reported on 03/20/2018)      Results for orders placed or performed during the hospital encounter of 03/20/18  (from the past 48 hour(s))  Blood gas, venous     Status: Abnormal   Collection Time: 03/20/18  2:33 AM  Result Value Ref Range   pH, Ven 7.51 (H) 7.250 - 7.430   pCO2, Ven 33 (L) 44.0 - 60.0 mmHg   pO2, Ven 50.0 (H) 32.0 - 45.0 mmHg   Bicarbonate 26.3 20.0 - 28.0 mmol/L   Acid-Base Excess 3.7 (H) 0.0 - 2.0 mmol/L   O2 Saturation 88.8 %   Patient temperature 37.0    Collection site VENOUS    Sample type VENOUS     Comment: Performed at [facility], [address], [place], [place] [address]  Comprehensive metabolic panel     Status: Abnormal   Collection Time: 03/20/18  2:35 AM  Result Value Ref Range  Sodium 137 135 - 145 mmol/L   Potassium 3.7 3.5 - 5.1 mmol/L   Chloride 100 98 - 111 mmol/L   CO2 26 22 - 32 mmol/L   Glucose, Bld 146 (H) 70 - 99 mg/dL   BUN 14 6 - 20 mg/dL   Creatinine, Ser 1.05 0.61 - 1.24 mg/dL   Calcium 8.2 (L) 8.9 - 10.3 mg/dL   Total Protein 7.0 6.5 - 8.1 g/dL   Albumin 3.5 3.5 - 5.0 g/dL   AST 32 15 - 41 U/L   ALT 35 0 - 44 U/L   Alkaline Phosphatase 43 38 - 126 U/L   Total Bilirubin 1.4 (H) 0.3 - 1.2 mg/dL   GFR calc non Af Amer >60 >60 mL/min   GFR calc Af Amer >60 >60 mL/min    Comment: (NOTE) The eGFR has been calculated using the CKD EPI equation. This calculation has not been validated in all clinical situations. eGFR's persistently <60 mL/min signify possible Chronic Kidney Disease.    Anion gap 11 5 - 15    Comment: Performed at [facility], [address] [place], [place] [address]  Brain natriuretic peptide     Status: Abnormal   Collection Time: 03/20/18  2:35 AM  Result Value Ref Range   B Natriuretic Peptide 292.0 (H) 0.0 - 100.0 pg/mL    Comment: Performed at [facility], [address] [place], [place] [address]  Troponin I     Status: Abnormal   Collection Time: 03/20/18  2:35 AM  Result Value Ref Range   Troponin I 0.10 (HH) <0.03 ng/mL    Comment: CRITICAL RESULT CALLED TO, READ BACK BY AND  VERIFIED WITH VALERIE CHANDLER '@0315'  03/20/18 [facility] Performed at [facility], [address] [place], [place] [address]   CBC with Differential     Status: None   Collection Time: 03/20/18  2:35 AM  Result Value Ref Range   WBC 9.8 4.0 - 10.5 K/uL   RBC 4.78 4.22 - 5.81 MIL/uL   Hemoglobin 14.1 13.0 - 17.0 g/dL   HCT 42.9 39.0 - 52.0 %   MCV 89.7 80.0 - 100.0 fL   MCH 29.5 26.0 - 34.0 pg   MCHC 32.9 30.0 - 36.0 g/dL   RDW 14.1 11.5 - 15.5 %   Platelets 173 150 - 400 K/uL   nRBC 0.0 0.0 - 0.2 %   Neutrophils Relative % 74 %   Neutro Abs 7.3 1.7 - 7.7 K/uL   Lymphocytes Relative 14 %   Lymphs Abs 1.4 0.7 - 4.0 K/uL   Monocytes Relative 10 %   Monocytes Absolute 1.0 0.1 - 1.0 K/uL   Eosinophils Relative 0 %   Eosinophils Absolute 0.0 0.0 - 0.5 K/uL   Basophils Relative 1 %   Basophils Absolute 0.1 0.0 - 0.1 K/uL   Immature Granulocytes 1 %   Abs Immature Granulocytes 0.06 0.00 - 0.07 K/uL    Comment: Performed at [facility], [address] [place], [place] [address]  Protime-INR     Status: Abnormal   Collection Time: 03/20/18  2:35 AM  Result Value Ref Range   Prothrombin Time 20.3 (H) 11.4 - 15.2 seconds   INR 1.76     Comment: Performed at [facility], [address] [place], [place] [address]  Lactic acid, plasma     Status: None   Collection Time: 03/20/18  3:02 AM  Result Value Ref Range   Lactic Acid, Venous  1.2 0.5 - 1.9 mmol/L    Comment: Performed at [facility], [address] [place], [place] [address]  Lactic acid, plasma     Status: None   Collection Time: 03/20/18  4:44 AM  Result Value Ref Range   Lactic Acid, Venous 1.3 0.5 - 1.9 mmol/L    Comment: Performed at [facility], [address] [place], [place] [address]  Blood gas, arterial     Status: Abnormal   Collection Time: 03/20/18  5:28 AM  Result Value Ref Range   FIO2 0.80    Delivery systems VENTILATOR    Mode ASSIST CONTROL    VT 550 mL    LHR 20 resp/min   Peep/cpap 5.0 cm H20   pH, Arterial 7.39 7.350 - 7.450   pCO2 arterial 41 32.0 - 48.0 mmHg   pO2, Arterial 73 (L) 83.0 - 108.0 mmHg   Bicarbonate 24.8 20.0 - 28.0 mmol/L   Acid-base deficit 0.2 0.0 - 2.0 mmol/L   O2 Saturation 94.3 %   Patient temperature 37.0    Collection site RIGHT RADIAL    Sample type ARTERIAL DRAW     Comment: Performed at [facility], [address] [place], [place] [address]  Glucose, capillary     Status: Abnormal   Collection Time: 03/20/18  5:52 AM  Result Value Ref Range   Glucose-Capillary 177 (H) 70 - 99 mg/dL   Dg Chest Portable 1 View  Result Date: 03/20/2018 CLINICAL DATA:  Intubation EXAM: PORTABLE CHEST 1 VIEW COMPARISON:  03/20/2018 FINDINGS: Support Apparatus: --Endotracheal tube: Tip at the level of the clavicular heads. --Enteric tube:Tip below the level of the diaphragm. The side port is not clearly visualized. --Catheter(s):None --Other: None Worsening consolidation in the right upper lobe. Mild cardiomegaly with findings of prior median sternotomy. Unchanged epicardial pacing leads. Consolidation at the left lung base, likely atelectasis. IMPRESSION: 1. Endotracheal tube tip at the level of the clavicular heads. 2. Worsening right mid lung consolidation. 3. Worsening left basilar opacities, likely atelectasis. Electronically Signed   By: Ulyses Jarred M.D.   On: 03/20/2018 04:48   Dg Chest Port 1 View  Result Date: 03/20/2018 CLINICAL DATA:  Shortness of breath and fever. Recent diagnosis of flu. EXAM: PORTABLE CHEST 1 VIEW COMPARISON:  Radiograph 11/20/2017. FINDINGS: Post median sternotomy. Left-sided pacemaker remains in place. Chronic elevation of left hemidiaphragm with adjacent scarring. Dense consolidation in the right mid lung likely in the anterior upper lobe. No large pleural effusion or pneumothorax. No pulmonary edema. IMPRESSION: 1. Dense consolidation in the right mid lung is likely in the anterior  upper lobe. In the setting of fever this is consistent with pneumonia. Followup PA and lateral chest X-ray is recommended in 3-4 weeks following trial of antibiotic therapy to ensure resolution and exclude underlying malignancy. 2. Chronic elevation of left hemidiaphragm with adjacent scarring. Electronically Signed   By: Keith Rake M.D.   On: 03/20/2018 03:09    Review of Systems  Constitutional: Positive for fever. Negative for chills.  HENT: Negative for sore throat and tinnitus.   Eyes: Negative for blurred vision and redness.  Respiratory: Positive for cough and shortness of breath.   Cardiovascular: Negative for chest pain, palpitations, orthopnea and PND.  Gastrointestinal: Negative for abdominal pain, diarrhea, nausea and vomiting.  Genitourinary: Negative for dysuria, frequency and urgency.  Musculoskeletal: Negative for joint pain and myalgias.  Skin: Negative for rash.       No lesions  Neurological:  Negative for speech change, focal weakness and weakness.  Endo/Heme/Allergies: Does not bruise/bleed easily.       No temperature intolerance  Psychiatric/Behavioral: Negative for depression and suicidal ideas.    Blood pressure 109/72, pulse 90, temperature (!) 100.4 F (38 C), temperature source Bladder, resp. rate 17, height '6\' 2"'  (1.88 m), weight 112.5 kg, SpO2 96 %. Physical Exam  Vitals reviewed. Constitutional: He is oriented to person, place, and time. He appears well-developed and well-nourished. No distress. He is intubated.  HENT:  Head: Normocephalic and atraumatic.  Mouth/Throat: Oropharynx is clear and moist.  Eyes: Pupils are equal, round, and reactive to light. Conjunctivae and EOM are normal. No scleral icterus.  Neck: Normal range of motion. Neck supple. No JVD present. No tracheal deviation present. No thyromegaly present.  Cardiovascular: Normal rate, regular rhythm and normal heart sounds. Exam reveals no gallop and no friction rub.  No murmur  heard. Respiratory: He is intubated. He has decreased breath sounds in the right middle field. He has wheezes (faint) in the right upper field. He has rhonchi in the right lower field.  Keloid midline sternotomy scar and pacemaker scar  GI: Soft. Bowel sounds are normal. He exhibits no distension. 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  Genitourinary:  Genitourinary Comments: Deferred  Musculoskeletal: Normal range of motion. He exhibits no edema.  Lymphadenopathy:    He has no cervical adenopathy.  Neurological: He is alert and oriented to person, place, and time. No cranial nerve deficit.  Skin: Skin is warm and dry. No rash noted. No erythema.  Psychiatric: He has a normal mood and affect. His behavior is normal. Judgment and thought content normal.     Assessment/Plan This is a 42 year old male admitted for respiratory failure. 1.  Respiratory failure: Acute; with hypoxia.  Continue mechanical ventilation and supplemental oxygen.  Currently the patient is on 100% FiO2.  2.  Pneumonia: Community-acquired; continue ceftriaxone and vancomycin.  Add azithromycin. 3.  Influenza A: Continue oseltamivir 4.  CHF: Nonischemic, 6 as needed. 5.  Diabetes mellitus type 2: Hold [place].  Sliding scale insulin while hospitalized 6.  DVT prophylaxis: Heparin Evan.  GI prophylaxis: H2 blocker per home regimen The patient is a full code.  Time spent on admission orders and critical care approximately 45 minutes  Harrie Foreman, MD 03/20/2018, 7:07 AM

## 2018-03-20 NOTE — Consult Note (Addendum)
PULMONARY / CRITICAL CARE MEDICINE   Name: Nicholas Horton MRN: [id number] DOB: 06/01/1975    ADMISSION DATE:  03/20/2018 CONSULTATION DATE:  03/20/18  REFERRING MD:  Dr. Marcille Blanco  CHIEF COMPLAINT:  Shortness of Breath  BRIEF DISCUSSION: 42 y.o. Male with Acute Hypoxic Respiratory Failure requiring intubation in the setting of Influenza A and CAP.  HISTORY OF PRESENT ILLNESS:   Nicholas Horton is a 42 y.o. Male with a PMH as listed below who presents to [facility] ED on 03/19/18 with c/o progressive shortness of breath over 2 to 3 days.  He was diagnosed with Influenza A at an urgent care on 10/22 and was prescribed Tamiflu. He did take one dose of the Tamiflu.  However, throughout the day  pt noted to have increasing shortness of breath and fever 103.3, to which EMS was called.  Upon arrival to the ED he was noted to have O2 sats 87% on room air.  Initial workup in the ED revealed BNP 292, Troponin 0.1, WBC 9.8, and Lactic acid 1.2. He is positive for Influenza A. CXR was concerning for consolidation in the right mid and upper lung. He refused BiPAP in the ED, and subsequently had to be intubated due to increased work of breathing and increasing FiO2 requirements.  He is admitted to [facility] ICU for treatment of Acute Hypoxic Respiratory Failure requiring intubation in the setting of Influenza A and CAP.  PCCM is consulted for further management.  PAST MEDICAL HISTORY :  He  has a past medical history of CHF (congestive heart failure) ([place]), Congenital bicuspid aortic valve (09/2014), Nonischemic cardiomyopathy ([place]) -- Resolved[I42.9] (09/2014), S/P AVR (aortic valve replacement) (10/2014), and Severe aortic stenosis (10/04/2014).  PAST SURGICAL HISTORY: He  has a past surgical history that includes Surgery scrotal / testicular; Cardiac catheterization (N/A, 10/04/2014); Cardiac catheterization (10/04/2014); Cardiac catheterization (N/A, 11/25/2014); Multiple extractions with alveoloplasty (N/A, 11/25/2014); Aortic  valve replacement (N/A, 12/01/2014); TEE without cardioversion (N/A, 12/01/2014); transthoracic echocardiogram (03/2015); and transthoracic echocardiogram (08/2017).  Allergies  Allergen Reactions  . Other     No NSAIDS r/t blood thinners    No current facility-administered medications on file prior to encounter.    Current Outpatient Medications on File Prior to Encounter  Medication Sig  . albuterol (PROVENTIL HFA;VENTOLIN HFA) 108 (90 Base) MCG/ACT inhaler Inhale 2 puffs into the lungs every 6 (six) hours as needed for wheezing or shortness of breath.  Marland Kitchen aspirin EC 81 MG EC tablet Take 1 tablet (81 mg total) by mouth daily.  Marland Kitchen buPROPion (WELLBUTRIN SR) 200 MG 12 hr tablet Take 1 tablet (200 mg total) by mouth 2 (two) times daily.  Marland Kitchen dicyclomine (BENTYL) 10 MG capsule Take 10 mg by mouth 4 (four) times daily as needed.  . diltiazem (CARDIZEM CD) 120 MG 24 hr capsule Take 1 capsule (120 mg total) by mouth daily.  . furosemide (LASIX) 40 MG tablet Take one tablet by mouth daily except mondays and thursdays take two tablet  and may take an extra one dose additional if needed (Patient taking differently: Take 40 mg by mouth as directed. Take one tablet by mouth daily except mondays and thursdays take two tablet  and may take an extra one dose additional if needed)  . Glycopyrrolate-Formoterol (BEVESPI AEROSPHERE IN) Inhale 2 puffs into the lungs 2 (two) times daily.  Marland Kitchen losartan (COZAAR) 25 MG tablet Take 1 tablet (25 mg total) by mouth daily.  . metFORMIN (GLUCOPHAGE) 1000 MG tablet Take 1 tablet (1,000 mg total)  by mouth 2 (two) times daily.  Marland Kitchen OLANZapine (ZYPREXA) 20 MG tablet Take 20 mg by mouth daily.  Marland Kitchen oseltamivir (TAMIFLU) 75 MG capsule Take 1 capsule (75 mg total) by mouth 2 (two) times daily. For 5 days  . sertraline (ZOLOFT) 50 MG tablet Take 50 mg by mouth every morning.  . warfarin (COUMADIN) 5 MG tablet Take 1.5 tablets (7.5 mg total) by mouth daily.  Marland Kitchen amoxicillin (AMOXIL) 500 MG  capsule Take 2 capsules (1,000 mg total) by mouth as needed. With any  Procedures.  . Blood Glucose Monitoring Suppl (ONE TOUCH ULTRA 2) w/Device KIT See admin instructions.  Marland Kitchen OLANZapine (ZYPREXA) 2.5 MG tablet Take 8 tablets (20 mg total) by mouth at bedtime. (Patient not taking: Reported on 03/20/2018)    FAMILY HISTORY:  His He indicated that his mother is deceased. He indicated that his father is alive. He indicated that the status of his maternal grandmother is unknown. He indicated that the status of his neg hx is unknown.   SOCIAL HISTORY: He  reports that he has quit smoking. His smoking use included cigarettes. He has a 40.00 pack-year smoking history. He has quit using smokeless tobacco. He reports that he does not drink alcohol or use drugs.  REVIEW OF SYSTEMS:   Unable to obtain due to intubation and sedation  SUBJECTIVE:  Unable to obtain due to intubation and sedation  VITAL SIGNS: BP 109/72 (BP Location: Right Arm)   Pulse 90   Temp (!) 100.4 F (38 C) (Bladder)   Resp 17   Ht '6\' 2"'  (1.88 m)   Wt 112.5 kg   SpO2 96%   BMI 31.84 kg/m   HEMODYNAMICS:    VENTILATOR SETTINGS: Vent Mode: PRVC FiO2 (%):  [80 %] 80 % Set Rate:  [20 bmp] 20 bmp Vt Set:  [550 mL] 550 mL PEEP:  [5 cmH20] 5 cmH20  INTAKE / OUTPUT: No intake/output data recorded.  PHYSICAL EXAMINATION: General:  Acutely ill appearing male, laying in bed, intubated, sedated, in NAD Neuro:  Sedated, withdraws from pain, Pupils PERRL 2 mm sluggish bilaterally HEENT:  Atraumatic, normocephalic, neck supple, no JVD Cardiovascular:  RRR, s1s2, no M/R/G, 2+ pulses throughout Lungs:  Coarse Rhonchi on Right upper and middle lobes, even, nonlabored, Abdomen:  Soft, nontender, nondistended, BS+ x4 Musculoskeletal:  Normal bulk and tone, no deformities, no edema Skin:  Warm, dry.  No obvious rashes, lesions, or ulcerations  LABS:  BMET Recent Labs  Lab 03/20/18 0235  NA 137  K 3.7  CL 100  CO2  26  BUN 14  CREATININE 1.05  GLUCOSE 146*    Electrolytes Recent Labs  Lab 03/20/18 0235  CALCIUM 8.2*    CBC Recent Labs  Lab 03/20/18 0235  WBC 9.8  HGB 14.1  HCT 42.9  PLT 173    Coag's Recent Labs  Lab 03/20/18 0235  INR 1.76    Sepsis Markers Recent Labs  Lab 03/20/18 0302 03/20/18 0444  LATICACIDVEN 1.2 1.3    ABG Recent Labs  Lab 03/20/18 0528  PHART 7.39  PCO2ART 41  PO2ART 73*    Liver Enzymes Recent Labs  Lab 03/20/18 0235  AST 32  ALT 35  ALKPHOS 43  BILITOT 1.4*  ALBUMIN 3.5    Cardiac Enzymes Recent Labs  Lab 03/20/18 0235  TROPONINI 0.10*    Glucose Recent Labs  Lab 03/20/18 0552  GLUCAP 177*    Imaging Dg Chest Portable 1 View  Result Date: 03/20/2018  CLINICAL DATA:  Intubation EXAM: PORTABLE CHEST 1 VIEW COMPARISON:  03/20/2018 FINDINGS: Support Apparatus: --Endotracheal tube: Tip at the level of the clavicular heads. --Enteric tube:Tip below the level of the diaphragm. The side port is not clearly visualized. --Catheter(s):None --Other: None Worsening consolidation in the right upper lobe. Mild cardiomegaly with findings of prior median sternotomy. Unchanged epicardial pacing leads. Consolidation at the left lung base, likely atelectasis. IMPRESSION: 1. Endotracheal tube tip at the level of the clavicular heads. 2. Worsening right mid lung consolidation. 3. Worsening left basilar opacities, likely atelectasis. Electronically Signed   By: Ulyses Jarred M.D.   On: 03/20/2018 04:48   Dg Chest Port 1 View  Result Date: 03/20/2018 CLINICAL DATA:  Shortness of breath and fever. Recent diagnosis of flu. EXAM: PORTABLE CHEST 1 VIEW COMPARISON:  Radiograph 11/20/2017. FINDINGS: Post median sternotomy. Left-sided pacemaker remains in place. Chronic elevation of left hemidiaphragm with adjacent scarring. Dense consolidation in the right mid lung likely in the anterior upper lobe. No large pleural effusion or pneumothorax. No  pulmonary edema. IMPRESSION: 1. Dense consolidation in the right mid lung is likely in the anterior upper lobe. In the setting of fever this is consistent with pneumonia. Followup PA and lateral chest X-ray is recommended in 3-4 weeks following trial of antibiotic therapy to ensure resolution and exclude underlying malignancy. 2. Chronic elevation of left hemidiaphragm with adjacent scarring. Electronically Signed   By: Keith Rake M.D.   On: 03/20/2018 03:09     STUDIES:    CULTURES: Blood x2 03/20/18>> Tracheal aspirate 03/20/18>> Strep pneumo urinary antigen 03/20/18>> Legionella urinary antigen 03/20/18>>  ANTIBIOTICS: Vancomycin 10/23>> Azithromycin 10/23>> Rocephin 10/23>>  SIGNIFICANT EVENTS: 03/20/18>> Admission to [facility] ICU  LINES/TUBES: ETT 03/20/18>>   ASSESSMENT / PLAN:  PULMONARY A: Acute Hypoxic Respiratory Failure in setting of Influenza A + CAP. PIP 23 P:   Full Vent support, PRVC: 8 cc/kg Wean FiO2 and PEEP as tolerated Follow intermittent CXR and ABG VAP bundle SBT when parameters met Continue Abx as above Continue Tamiflu  CARDIOVASCULAR A:  Mildly elevated Troponin, likely demand ischemia -Troponin 0.1 -No evidence of ischemia on EKG Elevated BNP -BNP 292 Hx:  Nonischemic cardiomyopathy, Severe aortic stenosis, s/p Aortic valve replacement, CHF,  P:  Start on therapeutic Lovenox to bridge while off Warfarin and INR is subtherapeutic Gentle diuresis to improve lung compliance Cardiac monitoring Maintain MAP >65 Levophed if needed to maintain MAP goal Obtain Echocardiogram Consider Cardiology consult  RENAL A:   No acute issues P:   Monitor I&O's / urinary output Follow BMP Ensure adequate renal perfusion Avoid nephrotoxic agents as able Replace electrolytes as indicated Trend Lactic acid  GASTROINTESTINAL A:   No acute issues P:   NPO Pepcid for SUP  HEMATOLOGIC A:   No acute issues P:  Monitor for s/sx of  bleeding Trend CBC Heparin SQ for VTE prophylaxis Transfuse for Hgb <7  INFECTIOUS A:   Meets SIRS Criteria CAP Influenza A P:   Monitor fever curve Trend WBC's and Procalcitonin Follow cultures as above Continue Rocephin, and Azithromycin Continue Tamiflu D/c Vanc with MRSA PCR -ve  ENDOCRINE A:   Hyperglycemia   P:   CBG's SSI Follow ICU hypo/hyperglycemia protocol  NEUROLOGIC A:   Sedation needs in setting of mechanical ventilation P:   RASS goal: 0 to -1 Propofol and Fentanyl gtt's to maintain RASS goal Daily WUA Provide supportive care Lights on during the day Avoid sedating meds as able  FAMILY  -  Updates: No family at bedside for update during NP rounds.  Pt is a full code.  - Inter-disciplinary family meet or Palliative Care meeting due by:  03/27/18    Darel Hong, AGACNP-BC [facility] Pager: [phone]  03/20/2018, 6:32 AM  I agree with the documented

## 2018-03-20 NOTE — Progress Notes (Signed)
Same day rounding progress note  This is a 42 year old male admitted for respiratory failure. 1.  Respiratory failure: Acute; with hypoxia: requiring mechanical venti - vent mgmt per PCCM - likely due to flu with pna 2.  Pneumonia: Community-acquired; continue ceftriaxone and azithromycin. 3.  Influenza A: Continue oseltamivir 4.  CHF: Echo pending, cardio c/s. H/o severe aortic stenosis s/p valve replacement 5. Elevated troponins: due to supply demand ischemia   Time spent: 10 mins

## 2018-03-20 NOTE — Progress Notes (Signed)
ET tube pushed 2 cm In per Dr. Hollace Hayward order

## 2018-03-20 NOTE — ED Notes (Signed)
Dr Lamont Snowball aware pt remains to have increased WOB and that O2 has been increased to 6 l [place].

## 2018-03-20 NOTE — ED Notes (Signed)
No blood cultures per dr Lamont Snowball

## 2018-03-20 NOTE — ED Triage Notes (Signed)
Pt was dx with flu at urgent care today. Increased shortness of breath since leaving. 103.3 fever at home, took tylenol before coming.

## 2018-03-21 ENCOUNTER — Inpatient Hospital Stay: Payer: Medicare HMO

## 2018-03-21 DIAGNOSIS — J9601 Acute respiratory failure with hypoxia: Secondary | ICD-10-CM

## 2018-03-21 LAB — BLOOD GAS, ARTERIAL
ACID-BASE EXCESS: 2.8 mmol/L — AB (ref 0.0–2.0)
ACID-BASE EXCESS: 3.5 mmol/L — AB (ref 0.0–2.0)
Acid-Base Excess: 1.6 mmol/L (ref 0.0–2.0)
BICARBONATE: 27.2 mmol/L (ref 20.0–28.0)
BICARBONATE: 31.5 mmol/L — AB (ref 20.0–28.0)
Bicarbonate: 32.8 mmol/L — ABNORMAL HIGH (ref 20.0–28.0)
FIO2: 0.6
FIO2: 1
FIO2: 1
MECHVT: 550 mL
MECHVT: 400 mL
MECHVT: 400 mL
O2 SAT: 88 %
O2 Saturation: 91.3 %
O2 Saturation: 80.6 %
PATIENT TEMPERATURE: 37
PATIENT TEMPERATURE: 37
PCO2 ART: 67 mmHg — AB (ref 32.0–48.0)
PCO2 ART: 73 mmHg — AB (ref 32.0–48.0)
PEEP/CPAP: 5 cmH2O
PEEP/CPAP: 5 cmH2O
PEEP: 10 cmH2O
PH ART: 7.26 — AB (ref 7.350–7.450)
PO2 ART: 63 mmHg — AB (ref 83.0–108.0)
PO2 ART: 63 mmHg — AB (ref 83.0–108.0)
Patient temperature: 37
RATE: 18 resp/min
RATE: 25 resp/min
RATE: 25 resp/min
pCO2 arterial: 46 mmHg (ref 32.0–48.0)
pH, Arterial: 7.38 (ref 7.350–7.450)
pH, Arterial: 7.28 — ABNORMAL LOW (ref 7.350–7.450)
pO2, Arterial: 51 mmHg — ABNORMAL LOW (ref 83.0–108.0)

## 2018-03-21 LAB — CBC
HEMATOCRIT: 36.9 % — AB (ref 39.0–52.0)
HEMOGLOBIN: 12 g/dL — AB (ref 13.0–17.0)
MCH: 30 pg (ref 26.0–34.0)
MCHC: 32.5 g/dL (ref 30.0–36.0)
MCV: 92.3 fL (ref 80.0–100.0)
Platelets: 169 10*3/uL (ref 150–400)
RBC: 4 MIL/uL — ABNORMAL LOW (ref 4.22–5.81)
RDW: 13.7 % (ref 11.5–15.5)
WBC: 10.6 10*3/uL — ABNORMAL HIGH (ref 4.0–10.5)
nRBC: 0 % (ref 0.0–0.2)

## 2018-03-21 LAB — GLUCOSE, CAPILLARY
Glucose-Capillary: 126 mg/dL — ABNORMAL HIGH (ref 70–99)
Glucose-Capillary: 129 mg/dL — ABNORMAL HIGH (ref 70–99)
Glucose-Capillary: 202 mg/dL — ABNORMAL HIGH (ref 70–99)
Glucose-Capillary: 157 mg/dL — ABNORMAL HIGH (ref 70–99)
Glucose-Capillary: 192 mg/dL — ABNORMAL HIGH (ref 70–99)
Glucose-Capillary: 173 mg/dL — ABNORMAL HIGH (ref 70–99)

## 2018-03-21 LAB — BASIC METABOLIC PANEL
Anion gap: 7 (ref 5–15)
BUN: 23 mg/dL — AB (ref 6–20)
CHLORIDE: 105 mmol/L (ref 98–111)
CO2: 27 mmol/L (ref 22–32)
CREATININE: 0.88 mg/dL (ref 0.61–1.24)
Calcium: 7.6 mg/dL — ABNORMAL LOW (ref 8.9–10.3)
GFR calc Af Amer: >60 mL/min (ref >60)
GFR calc non Af Amer: >60 mL/min (ref >60)
Glucose, Bld: 146 mg/dL — ABNORMAL HIGH (ref 70–99)
POTASSIUM: 4.1 mmol/L (ref 3.5–5.1)
Sodium: 139 mmol/L (ref 135–145)

## 2018-03-21 LAB — PROCALCITONIN: Procalcitonin: 1.88 ng/mL

## 2018-03-21 LAB — LEGIONELLA PNEUMOPHILA SEROGP 1 UR AG: L. PNEUMOPHILA SEROGP 1 UR AG: NEGATIVE

## 2018-03-21 LAB — PHOSPHORUS: PHOSPHORUS: 3.9 mg/dL (ref 2.5–4.6)

## 2018-03-21 LAB — ECHOCARDIOGRAM COMPLETE
Height: 74 in
Weight: 3968.28 oz

## 2018-03-21 LAB — TRIGLYCERIDES: Triglycerides: 848 mg/dL — ABNORMAL HIGH (ref <150)

## 2018-03-21 LAB — PROTIME-INR
INR: 3.67
Prothrombin Time: 35.9 seconds — ABNORMAL HIGH (ref 11.4–15.2)

## 2018-03-21 LAB — MAGNESIUM: Magnesium: 2.8 mg/dL — ABNORMAL HIGH (ref 1.7–2.4)

## 2018-03-21 MED ORDER — DOCUSATE SODIUM 50 MG/5ML PO LIQD
100.0000 mg | Freq: Two times a day (BID) | ORAL | Status: DC
  Administered 2018-03-21 – 2018-03-22 (×2): 100 mg via ORAL
  Filled 2018-03-21 (×2): qty 10

## 2018-03-21 MED ORDER — VECURONIUM BROMIDE 10 MG IV SOLR
10.0000 mg | Freq: Once | INTRAVENOUS | Status: AC
  Administered 2018-03-21: 10 mg via INTRAVENOUS
  Filled 2018-03-21: qty 10

## 2018-03-21 MED ORDER — FUROSEMIDE 20 MG PO TABS
20.0000 mg | ORAL_TABLET | Freq: Two times a day (BID) | ORAL | Status: DC
  Administered 2018-03-21: 20 mg via ORAL
  Filled 2018-03-21: qty 1

## 2018-03-21 MED ORDER — FENTANYL 2500MCG IN NS 250ML (10MCG/ML) PREMIX INFUSION
100.0000 ug/h | INTRAVENOUS | Status: DC
  Administered 2018-03-21 (×2): 250 ug/h via INTRAVENOUS
  Administered 2018-03-22: 225 ug/h via INTRAVENOUS
  Administered 2018-03-23: 250 ug/h via INTRAVENOUS
  Administered 2018-03-23: 225 ug/h via INTRAVENOUS
  Administered 2018-03-24 (×2): 250 ug/h via INTRAVENOUS
  Filled 2018-03-21 (×8): qty 250

## 2018-03-21 MED ORDER — PIPERACILLIN-TAZOBACTAM 3.375 G IVPB
3.3750 g | Freq: Three times a day (TID) | INTRAVENOUS | Status: DC
  Administered 2018-03-21 – 2018-03-24 (×9): 3.375 g via INTRAVENOUS
  Filled 2018-03-21 (×10): qty 50

## 2018-03-21 MED ORDER — WARFARIN - PHARMACIST DOSING INPATIENT: Freq: Every day | Status: DC

## 2018-03-21 MED ORDER — CISATRACURIUM BOLUS VIA INFUSION
5.0000 mg | Freq: Once | INTRAVENOUS | Status: DC
  Filled 2018-03-21: qty 5

## 2018-03-21 MED ORDER — MIDAZOLAM HCL 2 MG/2ML IJ SOLN
2.0000 mg | INTRAMUSCULAR | Status: DC | PRN
  Administered 2018-03-23 – 2018-03-24 (×2): 2 mg via INTRAVENOUS

## 2018-03-21 MED ORDER — OLANZAPINE 10 MG PO TABS
20.0000 mg | ORAL_TABLET | Freq: Every day | ORAL | Status: DC
  Administered 2018-03-21 – 2018-03-22 (×2): 20 mg
  Filled 2018-03-21 (×3): qty 2

## 2018-03-21 MED ORDER — PROPOFOL 1000 MG/100ML IV EMUL
25.0000 ug/kg/min | INTRAVENOUS | Status: DC
  Administered 2018-03-21: 60 ug/kg/min via INTRAVENOUS
  Administered 2018-03-21 (×2): 70 ug/kg/min via INTRAVENOUS
  Administered 2018-03-21 (×2): 60 ug/kg/min via INTRAVENOUS
  Administered 2018-03-22 (×2): 40 ug/kg/min via INTRAVENOUS
  Filled 2018-03-21 (×6): qty 100

## 2018-03-21 MED ORDER — BUPROPION HCL 100 MG PO TABS
200.0000 mg | ORAL_TABLET | Freq: Two times a day (BID) | ORAL | Status: DC
  Administered 2018-03-21 – 2018-03-22 (×3): 200 mg via ORAL
  Filled 2018-03-21 (×4): qty 2

## 2018-03-21 MED ORDER — SODIUM CHLORIDE 0.9 % IV SOLN
2.0000 g | INTRAVENOUS | Status: DC
  Filled 2018-03-21: qty 20

## 2018-03-21 MED ORDER — STERILE WATER FOR INJECTION IJ SOLN
INTRAMUSCULAR | Status: AC
  Administered 2018-03-21: 13:00:00
  Filled 2018-03-21: qty 10

## 2018-03-21 MED ORDER — FUROSEMIDE 10 MG/ML IJ SOLN
20.0000 mg | Freq: Two times a day (BID) | INTRAMUSCULAR | Status: AC
  Administered 2018-03-22 – 2018-03-23 (×3): 20 mg via INTRAVENOUS
  Filled 2018-03-21 (×3): qty 2

## 2018-03-21 MED ORDER — VECURONIUM BROMIDE 10 MG IV SOLR
INTRAVENOUS | Status: AC
  Administered 2018-03-21: 10 mg via INTRAVENOUS
  Filled 2018-03-21: qty 10

## 2018-03-21 MED ORDER — SERTRALINE HCL 50 MG PO TABS
50.0000 mg | ORAL_TABLET | Freq: Every day | ORAL | Status: DC
  Administered 2018-03-21 – 2018-03-22 (×2): 50 mg via ORAL
  Filled 2018-03-21 (×2): qty 1

## 2018-03-21 MED ORDER — SODIUM CHLORIDE 0.9 % IV SOLN
3.0000 ug/kg/min | INTRAVENOUS | Status: DC
  Administered 2018-03-21 – 2018-03-24 (×6): 3 ug/kg/min via INTRAVENOUS
  Administered 2018-03-24: 3.5 ug/kg/min via INTRAVENOUS
  Filled 2018-03-21 (×8): qty 20

## 2018-03-21 MED ORDER — METHYLPREDNISOLONE SODIUM SUCC 40 MG IJ SOLR
40.0000 mg | Freq: Two times a day (BID) | INTRAMUSCULAR | Status: DC
  Administered 2018-03-21 – 2018-03-24 (×7): 40 mg via INTRAVENOUS
  Filled 2018-03-21 (×7): qty 1

## 2018-03-21 MED ORDER — ARTIFICIAL TEARS OPHTHALMIC OINT
1.0000 "application " | TOPICAL_OINTMENT | Freq: Three times a day (TID) | OPHTHALMIC | Status: DC
  Administered 2018-03-21 – 2018-03-24 (×9): 1 via OPHTHALMIC
  Filled 2018-03-21: qty 3.5

## 2018-03-21 MED ORDER — MIDAZOLAM HCL 2 MG/2ML IJ SOLN
2.0000 mg | INTRAMUSCULAR | Status: DC | PRN
  Administered 2018-03-23: 2 mg via INTRAVENOUS

## 2018-03-21 NOTE — Procedures (Signed)
Central Venous Catheter Insertion Procedure Note Aksh Derda [id number] 1975-08-04  Procedure: Insertion of Central Venous Catheter Indications: Assessment of intravascular volume, Drug and/or fluid administration and Frequent blood sampling  Procedure Details Consent: Risks of procedure as well as the alternatives and risks of each were explained to the (patient/caregiver).  Consent for procedure obtained. Time Out: Verified patient identification, verified procedure, site/side was marked, verified correct patient position, special equipment/implants available, medications/allergies/relevent history reviewed, required imaging and test results available.  Performed  Maximum sterile technique was used including antiseptics, cap, gloves, gown, hand hygiene, mask and sheet. Skin prep: Chlorhexidine; local anesthetic administered A antimicrobial bonded/coated triple lumen catheter was placed in the right internal jugular vein using the Seldinger technique.  Evaluation Blood flow good Complications: No apparent complications Patient did tolerate procedure well. Chest X-ray ordered to verify placement.  CXR: normal.  Right internal jugular CVL placed utilizing ultrasound no complications noted during or following procedures.  Sonda Rumble, AGNP  Pulmonary/Critical Care Pager [phone] (please enter 7 digits) PCCM Consult Pager [phone] (please enter 7 digits)

## 2018-03-21 NOTE — Progress Notes (Signed)
[employer] - [place] at [facility]   PATIENT NAME: Nicholas Horton    MR#:  [id number]  DATE OF BIRTH:  [birth year]  SUBJECTIVE:  CHIEF COMPLAINT:   Chief Complaint  Patient presents with  . Shortness of Breath  not able to wean, family member at bedside (doesn't seem happy) REVIEW OF SYSTEMS:  Review of Systems  Unable to perform ROS: Critical illness    DRUG ALLERGIES:   Allergies  Allergen Reactions  . Other     No NSAIDS r/t blood thinners   VITALS:  Blood pressure 121/79, pulse (!) 101, temperature (!) 101.5 F (38.6 C), resp. rate (!) 22, height 6\' 2"  (1.88 m), weight 113.2 kg, SpO2 90 %. PHYSICAL EXAMINATION:  Physical Exam  HENT:  Head: Normocephalic and atraumatic.  ett in place  Eyes: Pupils are equal, round, and reactive to light. Conjunctivae and EOM are normal.  Neck: Normal range of motion. Neck supple. No tracheal deviation present. No thyromegaly present.  Cardiovascular: Normal rate, regular rhythm and normal heart sounds.  Pulmonary/Chest: Effort normal and breath sounds normal. No respiratory distress. He has no wheezes. He exhibits no tenderness.  Abdominal: Soft. Bowel sounds are normal. He exhibits no distension. There is no tenderness.  Musculoskeletal: Normal range of motion.  Neurological: No cranial nerve deficit.  Sedated on vent  Skin: Skin is warm and dry. No rash noted.   LABORATORY PANEL:  Male CBC Recent Labs  Lab 03/21/18 0510  WBC 10.6*  HGB 12.0*  HCT 36.9*  PLT 169   ------------------------------------------------------------------------------------------------------------------ Chemistries  Recent Labs  Lab 03/20/18 0235 03/21/18 0510  NA 137 139  K 3.7 4.1  CL 100 105  CO2 26 27  GLUCOSE 146* 146*  BUN 14 23*  CREATININE 1.05 0.88  CALCIUM 8.2* 7.6*  MG  --  2.8*  AST 32  --   ALT 35  --   ALKPHOS 43  --   BILITOT 1.4*  --    RADIOLOGY:  Dg Chest Port 1 View  Result Date:  03/21/2018 CLINICAL DATA:  Respiratory failure EXAM: PORTABLE CHEST 1 VIEW COMPARISON:  03/21/2018 and prior radiographs FINDINGS: An endotracheal tube with tip 3 cm above the carina, NG tube entering the stomach with tip off the field of view, RIGHT IJ central venous catheter with tip overlying the LOWER SVC and LEFT-sided pacemaker noted. Cardiomegaly with pulmonary vascular congestion identified. Bilateral pleural effusions and LEFT LOWER lung consolidation/atelectasis again noted. RIGHT mid lung opacity/consolidation is unchanged. There is no evidence of pneumothorax. IMPRESSION: RIGHT IJ central venous catheter placement without other significant change. No pneumothorax. Electronically Signed   By: Harmon Pier M.D.   On: 03/21/2018 14:11   Portable Chest Xray  Result Date: 03/21/2018 CLINICAL DATA:  Acute respiratory failure. EXAM: PORTABLE CHEST 1 VIEW COMPARISON:  03/20/2018 FINDINGS: Stable support apparatus. Mildly enlarged cardiac silhouette. Probable bilateral pleural effusions. Mild interstitial pulmonary edema. Stable appearance of of right mid lung field and left lower lung field airspace opacities. Osseous structures are without acute abnormality. Soft tissues are grossly normal. IMPRESSION: Probable bilateral pleural effusions with interstitial pulmonary edema. Stable appearance of of right mid lung field and left lower lung field airspace opacities. This may represent airspace consolidation, which is likely exaggerated by overlying pleural fluid. Electronically Signed   By: Ted Mcalpine M.D.   On: 03/21/2018 10:04   ASSESSMENT AND PLAN:  This is a 42 year old male admitted for respiratory failure.  1. Respiratory failure: Acute;  with hypoxia: requiring mechanical venti - vent mgmt per PCCM, requiring 100% FiO2- likely due to flu with pna 2. Pneumonia: Community-acquired; continue ceftriaxone and azithromycin. 3. Influenza A: Continue oseltamivir 4. CHF: Echo pending, H/o  severe aortic stenosis s/p valve replacement 5. Elevated troponins: due to supply demand ischemia     All the records are reviewed and case discussed with Care Management/Social Worker. Management plans discussed with the patient, family and they are in agreement.  CODE STATUS: Full Code  TOTAL TIME TAKING CARE OF THIS PATIENT: 15 minutes.   More than 50% of the time was spent in counseling/coordination of care: YES  POSSIBLE D/C IN 2-3 DAYS, DEPENDING ON CLINICAL CONDITION.   Delfino Lovett M.D on 03/21/2018 at 3:55 PM  Between 7am to 6pm - Pager - [phone]  After 6pm go to [email or web address] - Social research officer, government  [facility]  Office  [phone]  CC: Primary care physician; Smitty Cords, DO  Note: This dictation was prepared with Dragon dictation along with smaller phrase technology. Any transcriptional errors that result from this process are unintentional.

## 2018-03-21 NOTE — Progress Notes (Signed)
 Name: Nicholas Horton MRN: [id number] DOB: 08/07/1975     CONSULTATION DATE: 03/20/2018  Subjective & objectives: Febrile T-max one 1.5, sedated with propofol + fentanyl and had had to be started on paralytics because of developing ARDS with PIP 46 and PF ratio less than 200 requiring 100% and PEEP of 8  PAST MEDICAL HISTORY :   has a past medical history of CHF (congestive heart failure) ([facility]), Congenital bicuspid aortic valve (09/2014), Nonischemic cardiomyopathy ([facility]) -- Resolved[I42.9] (09/2014), S/P AVR (aortic valve replacement) (10/2014), and Severe aortic stenosis (10/04/2014).  has a past surgical history that includes Surgery scrotal / testicular; Cardiac catheterization (N/A, 10/04/2014); Cardiac catheterization (10/04/2014); Cardiac catheterization (N/A, 11/25/2014); Multiple extractions with alveoloplasty (N/A, 11/25/2014); Aortic valve replacement (N/A, 12/01/2014); TEE without cardioversion (N/A, 12/01/2014); transthoracic echocardiogram (03/2015); and transthoracic echocardiogram (08/2017). Prior to Admission medications   Medication Sig Start Date End Date Taking? Authorizing Provider  albuterol (PROVENTIL HFA;VENTOLIN HFA) 108 (90 Base) MCG/ACT inhaler Inhale 2 puffs into the lungs every 6 (six) hours as needed for wheezing or shortness of breath. 11/23/17  Yes Wieting, Yaakov, MD  aspirin EC 81 MG EC tablet Take 1 tablet (81 mg total) by mouth daily. 10/07/14  Yes Emokpae, Ejiroghene E, MD  buPROPion (WELLBUTRIN SR) 200 MG 12 hr tablet Take 1 tablet (200 mg total) by mouth 2 (two) times daily. 12/24/17  Yes Karamalegos, Alexander J, DO  dicyclomine (BENTYL) 10 MG capsule Take 10 mg by mouth 4 (four) times daily as needed. 11/27/17  Yes [provider]  diltiazem (CARDIZEM CD) 120 MG 24 hr capsule Take 1 capsule (120 mg total) by mouth daily. 03/04/18 06/02/18 Yes Harding, David W, MD  furosemide (LASIX) 40 MG tablet Take one tablet by mouth daily except mondays and thursdays take two  tablet  and may take an extra one dose additional if needed Patient taking differently: Take 40 mg by mouth as directed. Take one tablet by mouth daily except mondays and thursdays take two tablet  and may take an extra one dose additional if needed 03/04/18  Yes Harding, David W, MD  Glycopyrrolate-Formoterol (BEVESPI AEROSPHERE IN) Inhale 2 puffs into the lungs 2 (two) times daily.   Yes [provider]  losartan (COZAAR) 25 MG tablet Take 1 tablet (25 mg total) by mouth daily. 12/18/17  Yes Karamalegos, Alexander J, DO  metFORMIN (GLUCOPHAGE) 1000 MG tablet Take 1 tablet (1,000 mg total) by mouth 2 (two) times daily. 12/18/17  Yes Karamalegos, Alexander J, DO  OLANZapine (ZYPREXA) 20 MG tablet Take 20 mg by mouth daily. 02/20/18  Yes [provider]  oseltamivir (TAMIFLU) 75 MG capsule Take 1 capsule (75 mg total) by mouth 2 (two) times daily. For 5 days 03/18/18  Yes Karamalegos, Alexander J, DO  sertraline (ZOLOFT) 50 MG tablet Take 50 mg by mouth every morning. 12/13/17  Yes [provider]  warfarin (COUMADIN) 5 MG tablet Take 1.5 tablets (7.5 mg total) by mouth daily. 02/28/18  Yes Karamalegos, Alexander J, DO  amoxicillin (AMOXIL) 500 MG capsule Take 2 capsules (1,000 mg total) by mouth as needed. With any  Procedures. 03/04/18   Harding, David W, MD  Blood Glucose Monitoring Suppl (ONE TOUCH ULTRA 2) w/Device KIT See admin instructions. 12/05/17   [provider]  OLANZapine (ZYPREXA) 2.5 MG tablet Take 8 tablets (20 mg total) by mouth at bedtime. Patient not taking: Reported on 03/20/2018 11/23/17 11/23/18  Wieting, Micholas, MD   Allergies  Allergen Reactions  . Other       No NSAIDS r/t blood thinners    FAMILY HISTORY:  family history is not on file. SOCIAL HISTORY:  reports that he has quit smoking. His smoking use included cigarettes. He has a 40.00 pack-year smoking history. He has quit using smokeless tobacco. He reports that he does not drink alcohol  or use drugs.  REVIEW OF SYSTEMS:   Unable to obtain due to critical illness   VITAL SIGNS: Temp:  [96.8 F (36 C)-101.5 F (38.6 C)] 101.1 F (38.4 C) (10/24 1600) Pulse Rate:  [59-101] 95 (10/24 1600) Resp:  [6-25] 25 (10/24 1600) BP: (90-130)/(55-86) 116/71 (10/24 1600) SpO2:  [85 %-94 %] 85 % (10/24 1400) FiO2 (%):  [60 %-100 %] 100 % (10/24 1134) Weight:  [113.2 kg] 113.2 kg (10/24 0421)  Physical Examination:  Sedated and medically paralyzed On ventilator, no distress, bilateral equal air entry with no adventitious sounds S1 & S2 are audible with no murmur Benign abdominal exam with feeble peristalsis No leg edema   ASSESSMENT / PLAN: Acute respiratory failure with ARDS.  PF ratio 67 100% PEEP of 8 was PIP 46. -Low-dose steroid, Nimbex, low tidal volume lung protective ventilator settings with permissive hypercapnia.  -Monitor ABG, optimize vent settings to keep PIP less than 30 as tolerated and consider referral for ECMO if no improvement.  Pneumonia was influenza A.  Airspace disease of left lower lobe and right middle lobe, pulmonary congestion and bilateral pleural effusion. -Tamiflu, empiric Zosyn + Zithromax.  MRSA PCR negative respiratory culture negative. -Monitor CXR + CBC + FiO2 -Optimize diuresis to improve lung compliance, maintain CVP < 8 as renal function tolerates.  Elevated troponin with history of nonischemic cardiomyopathy status post AICD and status post aortic valve replacement for bicuspid aortic valve.  Echo 03/20/2018 LVEF 30 to 35% nonischemic cardiomyopathy. -Optimize warfarin for anticoagulation -Follow with cardiology evaluation  Sepsis with procalcitonin 1.88 down from 1.95.  MRSA PCR negative, blood culture and respiratory culture remains negative -Zosyn + Zithromax -Monitor procalcitonin  Prerenal azotemia with intravascular volume depletion. -Optimize volume, avoid nephrotoxins, monitor renal panel and urine output.  Anemia -Keep  hemoglobin more than 7 g/dL  Full code  DVT & GI prophylaxis.  Continue with supportive care  Patient fianc at the bedside was updated, made aware of his critical condition and she agreed to the plan of care  Critical care time 40 minutes     

## 2018-03-21 NOTE — Consult Note (Signed)
MEDICATION RELATED CONSULT NOTE - INITIAL   Pharmacy Consult for Electrolytes  Pharmacy consulted to assist in monitoring and replacing electrolytes in this 42 y.o. male admitted on 03/20/2018 with pneumonia. Patient also has influenza A+.   Assessment/Plan:   Goals: potassium ~ 4.0 and goal magnesium ~ 2.0  Patient's furosemide increased to PO 20 mg BID x 3 doses. Patient's electrolytes are WNL. No supplementation is required at this time.   Will check with morning labs tomorrow.   Pharmacy will continue to follow and adjust the electrolytes as needed.  Broadus John, PharmD Candidate  03/21/2018,3:18 PM

## 2018-03-21 NOTE — Consult Note (Signed)
Pharmacy Antibiotic Note  Nicholas Horton is a 42 y.o. male admitted on 03/20/2018 with pneumonia.  Pharmacy has been consulted for Zosyn dosing. Patient also is influenza A +. Patient is currently intubated.  Plan: Discontinued Ceftriaxone (last dose given @ 0210 today) and started Zosyn IV 3.375g q8h first dose scheduled for 1530 today due to worsening chest x-ray per discussion with CCM.   Continue Azithromycin 500 mg PO Q24 scheduled to complete tomorrow.   Patient is also receiving Tamiflu 75 mg PO BID.  Height: 6\' 2"  (188 cm) Weight: 249 lb 9 oz (113.2 kg) IBW/kg (Calculated) : 82.2  Temp (24hrs), Avg:98.5 F (36.9 C), Min:96.8 F (36 C), Max:101.5 F (38.6 C)  Recent Labs  Lab 03/20/18 0235 03/20/18 0302 03/20/18 0444 03/20/18 0717 03/20/18 0915 03/21/18 0510  WBC 9.8  --   --   --   --  10.6*  CREATININE 1.05  --   --   --   --  0.88  LATICACIDVEN  --  1.2 1.3 1.4 1.4  --     Estimated Creatinine Clearance: 147.8 mL/min (by C-G formula based on SCr of 0.88 mg/dL).    Allergies  Allergen Reactions  . Other     No NSAIDS r/t blood thinners    Antimicrobials this admission: Ceftriaxone 10/24 >>   Azithromycin 10/23 >>  Ceftriaxone 10/23 >> 10/24  10/23 Vancomycin x 1   Dose adjustments this admission: N/A  Microbiology results: 10/23 BCx 2/2: no growth x 1 day  10/23 Respiratory Cx: no growth  10/23 MRSA PCR: (-)  Thank you for allowing pharmacy to be a part of this patient's care.   Broadus John, PharmD Candidate  03/21/2018 3:03 PM

## 2018-03-21 NOTE — Procedures (Signed)
Arterial Catheter Insertion Procedure Note Nicholas Horton [id number] 08-25-1975  Procedure: Insertion of Arterial Catheter  Indications: Blood pressure monitoring and Frequent blood sampling  Procedure Details Consent: Risks of procedure as well as the alternatives and risks of each were explained to the (patient/caregiver).  Consent for procedure obtained. Time Out: Verified patient identification, verified procedure, site/side was marked, verified correct patient position, special equipment/implants available, medications/allergies/relevent history reviewed, required imaging and test results available.  Performed  Maximum sterile technique was used including antiseptics, cap, gloves, gown, hand hygiene, mask and sheet. Skin prep: Chlorhexidine; local anesthetic administered 20 gauge catheter was inserted into left radial artery using the Seldinger technique. ULTRASOUND GUIDANCE USED: NO Evaluation Blood flow good; BP tracing good. Complications: No apparent complications.   Sonda Rumble, AGNP  Pulmonary/Critical Care Pager [phone] (please enter 7 digits) PCCM Consult Pager [phone] (please enter 7 digits)

## 2018-03-21 NOTE — Consult Note (Signed)
ANTICOAGULATION CONSULT NOTE - Initial Consult  Pharmacy Consult for warfarin dosing Indication: AVR  Allergies  Allergen Reactions  . Other     No NSAIDS r/t blood thinners    Patient Measurements: Height: _0  (188 cm) Weight: 249 lb 9 oz (113.2 kg) IBW/kg (Calculated) : 82.2 Heparin Dosing Weight:   Vital Signs: Temp: 101.8 F (38.8 C) (10/24 1800) BP: 124/73 (10/24 1800) Pulse Rate: 102 (10/24 1800)  Labs: Recent Labs    03/20/18 0235 03/20/18 0717 03/20/18 1234 03/20/18 1811 03/21/18 0510  HGB 14.1  --   --   --  12.0*  HCT 42.9  --   --   --  36.9*  PLT 173  --   --   --  169  LABPROT 20.3*  --   --   --  35.9*  INR 1.76  --   --   --  3.67  CREATININE 1.05  --   --   --  0.88  TROPONINI 0.10* 0.06* 0.03* <0.03  --     Estimated Creatinine Clearance: 147.8 mL/min (by C-G formula based on SCr of 0.88 mg/dL).   Medical History: Past Medical History:  Diagnosis Date  . CHF (congestive heart failure) ([place])   . Congenital bicuspid aortic valve 09/2014   - s/p AVR  . Nonischemic cardiomyopathy ([place]) -- Resolved[I42.9] 09/2014   EF by Echo 20-25% (pre-op AVR) --> Echo 10/2014 & 03/2015: EF 50-55% - also Recent Normal Diastolic parameters   . S/P AVR (aortic valve replacement) 10/2014   21 mm Carbometrics Mechanical Valve -- Epicardial PPM Leads  . Severe aortic stenosis 10/04/2014   Presented with Syncope & CHF    Medications:  Medications Prior to Admission  Medication Sig Dispense Refill Last Dose  . albuterol (PROVENTIL HFA;VENTOLIN HFA) 108 (90 Base) MCG/ACT inhaler Inhale 2 puffs into the lungs every 6 (six) hours as needed for wheezing or shortness of breath. 1 Inhaler 0 prn at prn  . aspirin EC 81 MG EC tablet Take 1 tablet (81 mg total) by mouth daily. 30 tablet 1 03/19/2018 at Unknown time  . buPROPion (WELLBUTRIN SR) 200 MG 12 hr tablet Take 1 tablet (200 mg total) by mouth 2 (two) times daily. 180 tablet 0 03/19/2018 at Unknown time  . dicyclomine  (BENTYL) 10 MG capsule Take 10 mg by mouth 4 (four) times daily as needed.  1 prn at prn  . diltiazem (CARDIZEM CD) 120 MG 24 hr capsule Take 1 capsule (120 mg total) by mouth daily. 30 capsule 11 03/19/2018 at Unknown time  . furosemide (LASIX) 40 MG tablet Take one tablet by mouth daily except mondays and thursdays take two tablet  and may take an extra one dose additional if needed (Patient taking differently: Take 40 mg by mouth as directed. Take one tablet by mouth daily except mondays and thursdays take two tablet  and may take an extra one dose additional if needed) 70 tablet 11 03/19/2018 at Unknown time  . Glycopyrrolate-Formoterol (BEVESPI AEROSPHERE IN) Inhale 2 puffs into the lungs 2 (two) times daily.   03/19/2018 at Unknown time  . losartan (COZAAR) 25 MG tablet Take 1 tablet (25 mg total) by mouth daily. 90 tablet 3 03/19/2018 at Unknown time  . metFORMIN (GLUCOPHAGE) 1000 MG tablet Take 1 tablet (1,000 mg total) by mouth 2 (two) times daily. 90 tablet 3 03/19/2018 at Unknown time  . OLANZapine (ZYPREXA) 20 MG tablet Take 20 mg by mouth daily.  1  03/19/2018 at Unknown time  . oseltamivir (TAMIFLU) 75 MG capsule Take 1 capsule (75 mg total) by mouth 2 (two) times daily. For 5 days 10 capsule 0 03/19/2018 at Unknown time  . sertraline (ZOLOFT) 50 MG tablet Take 50 mg by mouth every morning.  1 03/19/2018 at Unknown time  . warfarin (COUMADIN) 5 MG tablet Take 1.5 tablets (7.5 mg total) by mouth daily. 45 tablet 5 03/19/2018 at Unknown time  . amoxicillin (AMOXIL) 500 MG capsule Take 2 capsules (1,000 mg total) by mouth as needed. With any  Procedures. 2 capsule 6 Taking  . Blood Glucose Monitoring Suppl (ONE TOUCH ULTRA 2) w/Device KIT See admin instructions.  0 Taking  . OLANZapine (ZYPREXA) 2.5 MG tablet Take 8 tablets (20 mg total) by mouth at bedtime. (Patient not taking: Reported on 03/20/2018)   Not Taking at Unknown time    Assessment: 42 yo male with history of nonischemic  congestive heart failure with an EF of 20 to 25% status post pacemaker and AICD placement. AVR.  Chart review shows outpatient INR goal of 2.0-3.0.  Goal of Therapy:  INR 2-3 (per chart review) but in discussion with Dr. Soyla Murphy present goal 2.5-3 Monitor platelets by anticoagulation protocol: Yes   Plan:  10/24 INR 3.67  Will hold dose tonight and recheck INR with morning labs. Plan to evaluate goal INR as chart review shows possible left ventricular dysfunction which may warrant increased INR goal of 2.5-3.5  Forrest Moron, PharmD 03/21/2018,7:19 PM

## 2018-03-22 ENCOUNTER — Inpatient Hospital Stay: Payer: Medicare HMO

## 2018-03-22 LAB — CULTURE, RESPIRATORY: CULTURE: NO GROWTH

## 2018-03-22 LAB — CBC WITH DIFFERENTIAL/PLATELET
Abs Immature Granulocytes: 0.14 10*3/uL — ABNORMAL HIGH (ref 0.00–0.07)
Basophils Absolute: 0 10*3/uL (ref 0.0–0.1)
Basophils Relative: 0 %
EOS PCT: 0 %
Eosinophils Absolute: 0 10*3/uL (ref 0.0–0.5)
HCT: 35.5 % — ABNORMAL LOW (ref 39.0–52.0)
HEMOGLOBIN: 11.1 g/dL — AB (ref 13.0–17.0)
Immature Granulocytes: 2 %
LYMPHS PCT: 11 %
Lymphs Abs: 0.8 10*3/uL (ref 0.7–4.0)
MCH: 29.5 pg (ref 26.0–34.0)
MCHC: 31.3 g/dL (ref 30.0–36.0)
MCV: 94.4 fL (ref 80.0–100.0)
Monocytes Absolute: 0.3 10*3/uL (ref 0.1–1.0)
Monocytes Relative: 4 %
NEUTROS ABS: 6.6 10*3/uL (ref 1.7–7.7)
NEUTROS PCT: 83 %
NRBC: 0 % (ref 0.0–0.2)
Platelets: 197 10*3/uL (ref 150–400)
RBC: 3.76 MIL/uL — AB (ref 4.22–5.81)
RDW: 14 % (ref 11.5–15.5)
WBC: 7.9 10*3/uL (ref 4.0–10.5)

## 2018-03-22 LAB — COMPREHENSIVE METABOLIC PANEL
ALBUMIN: 2.8 g/dL — AB (ref 3.5–5.0)
ALT: 48 U/L — ABNORMAL HIGH (ref 0–44)
ANION GAP: 5 (ref 5–15)
AST: 30 U/L (ref 15–41)
Alkaline Phosphatase: 38 U/L (ref 38–126)
BUN: 26 mg/dL — ABNORMAL HIGH (ref 6–20)
CHLORIDE: 106 mmol/L (ref 98–111)
CO2: 31 mmol/L (ref 22–32)
Calcium: 7.6 mg/dL — ABNORMAL LOW (ref 8.9–10.3)
Creatinine, Ser: 0.72 mg/dL (ref 0.61–1.24)
GFR calc Af Amer: >60 mL/min (ref >60)
GFR calc non Af Amer: >60 mL/min (ref >60)
GLUCOSE: 189 mg/dL — AB (ref 70–99)
POTASSIUM: 4.6 mmol/L (ref 3.5–5.1)
SODIUM: 142 mmol/L (ref 135–145)
Total Bilirubin: 0.5 mg/dL (ref 0.3–1.2)
Total Protein: 6 g/dL — ABNORMAL LOW (ref 6.5–8.1)

## 2018-03-22 LAB — CULTURE, RESPIRATORY W GRAM STAIN: Special Requests: NORMAL

## 2018-03-22 LAB — MAGNESIUM: Magnesium: 2.5 mg/dL — ABNORMAL HIGH (ref 1.7–2.4)

## 2018-03-22 LAB — BLOOD GAS, ARTERIAL
ACID-BASE EXCESS: 7.8 mmol/L — AB (ref 0.0–2.0)
Acid-Base Excess: 4.5 mmol/L — ABNORMAL HIGH (ref 0.0–2.0)
Acid-Base Excess: 7.4 mmol/L — ABNORMAL HIGH (ref 0.0–2.0)
Allens test (pass/fail): POSITIVE — AB
BICARBONATE: 35.6 mmol/L — AB (ref 20.0–28.0)
Bicarbonate: 31.8 mmol/L — ABNORMAL HIGH (ref 20.0–28.0)
Bicarbonate: 34.5 mmol/L — ABNORMAL HIGH (ref 20.0–28.0)
FIO2: 100
FIO2: 100
FIO2: 100
LHR: 28 {breaths}/min
LHR: 28 {breaths}/min
MECHANICAL RATE: 28
O2 SAT: 96 %
O2 Saturation: 93.8 %
O2 Saturation: 94.2 %
PCO2 ART: 59 mmHg — AB (ref 32.0–48.0)
PEEP/CPAP: 10 cmH2O
PEEP/CPAP: 10 cmH2O
PEEP: 10 cmH2O
PH ART: 7.34 — AB (ref 7.350–7.450)
PO2 ART: 87 mmHg (ref 83.0–108.0)
PO2 ART: 73 mmHg — AB (ref 83.0–108.0)
PO2 ART: 76 mmHg — AB (ref 83.0–108.0)
Patient temperature: 37
Patient temperature: 37
Patient temperature: 37
VT: 400 mL
VT: 400 mL
pCO2 arterial: 61 mmHg — ABNORMAL HIGH (ref 32.0–48.0)
pCO2 arterial: 66 mmHg (ref 32.0–48.0)
pH, Arterial: 7.34 — ABNORMAL LOW (ref 7.350–7.450)
pH, Arterial: 7.36 (ref 7.350–7.450)

## 2018-03-22 LAB — PROCALCITONIN: PROCALCITONIN: 1.06 ng/mL

## 2018-03-22 LAB — PROTIME-INR
INR: 3.58
Prothrombin Time: 35.2 seconds — ABNORMAL HIGH (ref 11.4–15.2)

## 2018-03-22 LAB — TRIGLYCERIDES
Triglycerides: 480 mg/dL — ABNORMAL HIGH (ref <150)
Triglycerides: 374 mg/dL — ABNORMAL HIGH (ref <150)

## 2018-03-22 LAB — CALCIUM, IONIZED: Calcium, Ionized, Serum: 4.1 mg/dL — ABNORMAL LOW (ref 4.5–5.6)

## 2018-03-22 LAB — HEPARIN LEVEL (UNFRACTIONATED): Heparin Unfractionated: 0.18 IU/mL — ABNORMAL LOW (ref 0.30–0.70)

## 2018-03-22 LAB — GLUCOSE, CAPILLARY
Glucose-Capillary: 177 mg/dL — ABNORMAL HIGH (ref 70–99)
Glucose-Capillary: 160 mg/dL — ABNORMAL HIGH (ref 70–99)
Glucose-Capillary: 151 mg/dL — ABNORMAL HIGH (ref 70–99)
Glucose-Capillary: 198 mg/dL — ABNORMAL HIGH (ref 70–99)
Glucose-Capillary: 154 mg/dL — ABNORMAL HIGH (ref 70–99)
Glucose-Capillary: 177 mg/dL — ABNORMAL HIGH (ref 70–99)

## 2018-03-22 LAB — PHOSPHORUS: Phosphorus: 3 mg/dL (ref 2.5–4.6)

## 2018-03-22 MED ORDER — DOCUSATE SODIUM 50 MG/5ML PO LIQD
100.0000 mg | Freq: Two times a day (BID) | ORAL | Status: DC
  Administered 2018-03-22 – 2018-03-23 (×2): 100 mg
  Filled 2018-03-22: qty 10

## 2018-03-22 MED ORDER — INSULIN REGULAR(HUMAN) IN NACL 100-0.9 UT/100ML-% IV SOLN
INTRAVENOUS | Status: DC
  Filled 2018-03-22: qty 100

## 2018-03-22 MED ORDER — ACETAMINOPHEN 160 MG/5ML PO SOLN
650.0000 mg | Freq: Four times a day (QID) | ORAL | Status: DC | PRN
  Administered 2018-03-22 – 2018-03-23 (×2): 650 mg
  Filled 2018-03-22 (×3): qty 20.3

## 2018-03-22 MED ORDER — VANCOMYCIN HCL IN DEXTROSE 1-5 GM/200ML-% IV SOLN
1000.0000 mg | Freq: Once | INTRAVENOUS | Status: AC
  Administered 2018-03-22: 1000 mg via INTRAVENOUS
  Filled 2018-03-22: qty 200

## 2018-03-22 MED ORDER — SODIUM CHLORIDE 0.9 % IV SOLN
0.0000 mg/h | INTRAVENOUS | Status: DC
  Administered 2018-03-22: 1 mg/h via INTRAVENOUS
  Administered 2018-03-23: 1.5 mg/h via INTRAVENOUS
  Administered 2018-03-24 (×2): 2 mg/h via INTRAVENOUS
  Filled 2018-03-22 (×6): qty 10

## 2018-03-22 MED ORDER — BUPROPION HCL 100 MG PO TABS
200.0000 mg | ORAL_TABLET | Freq: Two times a day (BID) | ORAL | Status: DC
  Administered 2018-03-22 – 2018-03-24 (×4): 200 mg
  Filled 2018-03-22 (×5): qty 2

## 2018-03-22 MED ORDER — INSULIN ASPART 100 UNIT/ML ~~LOC~~ SOLN
0.0000 [IU] | Freq: Four times a day (QID) | SUBCUTANEOUS | Status: DC
  Administered 2018-03-22: 2 [IU] via SUBCUTANEOUS

## 2018-03-22 MED ORDER — DEXTROSE 5 % IV SOLN: INTRAVENOUS | Status: DC

## 2018-03-22 MED ORDER — SERTRALINE HCL 50 MG PO TABS
50.0000 mg | ORAL_TABLET | Freq: Every day | ORAL | Status: DC
  Administered 2018-03-23: 50 mg
  Filled 2018-03-22: qty 1

## 2018-03-22 MED ORDER — HEPARIN (PORCINE) IN NACL 100-0.45 UNIT/ML-% IJ SOLN
1800.0000 [IU]/h | INTRAMUSCULAR | Status: DC
  Administered 2018-03-22: 1000 [IU]/h via INTRAVENOUS
  Administered 2018-03-23: 1350 [IU]/h via INTRAVENOUS
  Administered 2018-03-24: 1750 [IU]/h via INTRAVENOUS
  Filled 2018-03-22 (×5): qty 250

## 2018-03-22 MED ORDER — OSELTAMIVIR PHOSPHATE 6 MG/ML PO SUSR
75.0000 mg | Freq: Two times a day (BID) | ORAL | Status: DC
  Administered 2018-03-22 – 2018-03-24 (×4): 75 mg
  Filled 2018-03-22 (×6): qty 12.5

## 2018-03-22 MED ORDER — INSULIN ASPART 100 UNIT/ML ~~LOC~~ SOLN
0.0000 [IU] | SUBCUTANEOUS | Status: DC
  Administered 2018-03-22 – 2018-03-23 (×3): 3 [IU] via SUBCUTANEOUS
  Administered 2018-03-23: 2 [IU] via SUBCUTANEOUS
  Administered 2018-03-23 – 2018-03-24 (×4): 3 [IU] via SUBCUTANEOUS
  Filled 2018-03-22 (×8): qty 1

## 2018-03-22 MED ORDER — INSULIN ASPART 100 UNIT/ML ~~LOC~~ SOLN
0.0000 [IU] | Freq: Every day | SUBCUTANEOUS | Status: DC
  Filled 2018-03-22: qty 1

## 2018-03-22 NOTE — Care Management Note (Signed)
Case Management Note  Patient Details  Name: Nicholas Horton MRN: [id number] Date of Birth: 08/28/1975  Subjective/Objective:      RNCM received a call from [name] the significant other wanting to know more about LTAC, she had spoken with the patient's dad and he does not want to do LTAC right now.  RNCM apologized for bringing up LTAC prematurely- patient does not qualify yet and he is in critical condition and cannot be moved.  RNCM told [name] that we can come back to the LTAC conversation at a later time when it is more appropriate.                  Action/Plan:   Expected Discharge Date:                  Expected Discharge Plan:     In-House Referral:     Discharge planning Services  CM Consult  Post Acute Care Choice:    Choice offered to:     DME Arranged:    DME Agency:     HH Arranged:    HH Agency:     Status of Service:  In process, will continue to follow  If discussed at Long Length of Stay Meetings, dates discussed:    Additional Comments:  Allayne Butcher, RN 03/22/2018, 2:31 PM

## 2018-03-22 NOTE — Progress Notes (Signed)
ANTICOAGULATION CONSULT NOTE - Initial Consult  Pharmacy Consult for heparin dosing  Indication: hypertriglyceridemia   Allergies  Allergen Reactions  . Other     No NSAIDS r/t blood thinners    Patient Measurements: Height: _0  (188 cm) Weight: 250 lb 10.6 oz (113.7 kg) IBW/kg (Calculated) : 82.2  Vital Signs: Temp: 100.6 F (38.1 C) (10/25 1930) Temp Source: Bladder (10/25 1930) BP: 128/76 (10/25 1930) Pulse Rate: 73 (10/25 1930)  Labs: Recent Labs    03/20/18 0235 03/20/18 0717 03/20/18 1234 03/20/18 1811 03/21/18 0510 03/22/18 0351  HGB 14.1  --   --   --  12.0* 11.1*  HCT 42.9  --   --   --  36.9* 35.5*  PLT 173  --   --   --  169 197  LABPROT 20.3*  --   --   --  35.9* 35.2*  INR 1.76  --   --   --  3.67 3.58  CREATININE 1.05  --   --   --  0.88 0.72  TROPONINI 0.10* 0.06* 0.03* <0.03  --   --     Estimated Creatinine Clearance: 162.9 mL/min (by C-G formula based on SCr of 0.72 mg/dL).   Medical History: Past Medical History:  Diagnosis Date  . CHF (congestive heart failure) ([place])   . Congenital bicuspid aortic valve 09/2014   - s/p AVR  . Nonischemic cardiomyopathy ([place]) -- Resolved[I42.9] 09/2014   EF by Echo 20-25% (pre-op AVR) --> Echo 10/2014 & 03/2015: EF 50-55% - also Recent Normal Diastolic parameters   . S/P AVR (aortic valve replacement) 10/2014   21 mm Carbometrics Mechanical Valve -- Epicardial PPM Leads  . Severe aortic stenosis 10/04/2014   Presented with Syncope & CHF    Medications:  Scheduled:  . artificial tears  1 application Both Eyes O2V  . aspirin  81 mg Per Tube Daily  . buPROPion  200 mg Per Tube BID  . chlorhexidine gluconate (MEDLINE KIT)  15 mL Mouth Rinse BID  . cisatracurium  5 mg Intravenous Once  . docusate  100 mg Per Tube BID  . famotidine  20 mg Per Tube BID  . feeding supplement (PRO-STAT SUGAR FREE 64)  60 mL Per Tube 5 X Daily  . feeding supplement (VITAL HIGH PROTEIN)  1,000 mL Per Tube Q24H  . furosemide  20  mg Intravenous Q12H  . insulin aspart  0-5 Units Subcutaneous QHS  . insulin aspart  0-9 Units Subcutaneous Q6H  . mouth rinse  15 mL Mouth Rinse 10 times per day  . methylPREDNISolone (SOLU-MEDROL) injection  40 mg Intravenous BID  . multivitamin  15 mL Per Tube Daily  . OLANZapine  20 mg Per Tube QHS  . oseltamivir  75 mg Per Tube BID  . [START ON 03/23/2018] sertraline  50 mg Per Tube Daily   Infusions:  . cisatracurium (NIMBEX) infusion 3 mcg/kg/min (03/22/18 1900)  . fentaNYL infusion INTRAVENOUS 225 mcg/hr (03/22/18 1900)  . heparin 1,000 Units/hr (03/22/18 1900)  . midazolam (VERSED) infusion 1.5 mg/hr (03/22/18 1900)  . piperacillin-tazobactam (ZOSYN)  IV Stopped (03/22/18 1831)    Assessment: Pharmacy consulted for heparin drip management for 42 yo male admitted with acute respiratory failure. Patient is currently on ARDS protocol requiring cisatritrocrium infusion. Patient previously on propofol infusion and transitioned to midazolam due to hypertriglyceridemia. Patient on warfarin as an outpatient with stated goal of 2-3, however due to multiple risk factors (left ventricular dysfunction, CHF, pacemaker  and AICD placement) patient's goal in house will be 2.5-3. INR remains elevated at 3.58.  Goal of Therapy:  INR 2.5-3 Heparin level 0.3-0.5 units/ml Monitor platelets by anticoagulation protocol: Yes   Plan:  Per discussion on ICU rounds, will initiate heparin 1000 units/hr with no bolus for hypertriglyceridemia and discontinue warfarin while patient is receiving heparin infusion. Goal heparin level is 0.3-0.5. Patient is to receive no boluses while INR is in therapeutic range.   Patient is getting triglyceride levels Q8hr and daily INRs.   Will coordinate with CCM when to discontinue heparin and resume warfarin.   Pharmacy will continue to monitor and adjust per consult.   Saleen Peden L 03/22/2018,7:41 PM

## 2018-03-22 NOTE — Progress Notes (Signed)
Chaplain responded to a request for prayer. Fiance and brother were at the bedside. Chaplain prayed for healing for Pt and strength   03/22/18 1300  Clinical Encounter Type  Visited With Patient and family together  Visit Type Initial;Spiritual support  Referral From Nurse  Spiritual Encounters  Spiritual Needs Prayer   for family. There were no other needs

## 2018-03-22 NOTE — Progress Notes (Signed)
[facility] - [place] [facility]   PATIENT NAME: Nicholas Horton    MR#:  [id number]  DATE OF BIRTH:  09-08-1975  SUBJECTIVE:  CHIEF COMPLAINT:   Chief Complaint  Patient presents with  . Shortness of Breath  unable to wean. sedated with propofol + fentanyl and had to be started on paralytics because of developing ARDS y'day REVIEW OF SYSTEMS:  Review of Systems  Unable to perform ROS: Critical illness   DRUG ALLERGIES:   Allergies  Allergen Reactions  . Other     No NSAIDS r/t blood thinners   VITALS:  Blood pressure 104/63, pulse 74, temperature 99.9 F (37.7 C), resp. rate 15, height 6\' 2"  (1.88 m), weight 113.7 kg, SpO2 95 %. PHYSICAL EXAMINATION:  Physical Exam  HENT:  Head: Normocephalic and atraumatic.  ett in place  Eyes: Pupils are equal, round, and reactive to light. Conjunctivae and EOM are normal.  Neck: Normal range of motion. Neck supple. No tracheal deviation present. No thyromegaly present.  Cardiovascular: Normal rate, regular rhythm and normal heart sounds.  Pulmonary/Chest: Effort normal and breath sounds normal. No respiratory distress. He has no wheezes. He exhibits no tenderness.  Abdominal: Soft. Bowel sounds are normal. He exhibits no distension. There is no tenderness.  Musculoskeletal: Normal range of motion.  Neurological: No cranial nerve deficit.  Sedated on vent  Skin: Skin is warm and dry. No rash noted.   LABORATORY PANEL:  Male CBC Recent Labs  Lab 03/22/18 0351  WBC 7.9  HGB 11.1*  HCT 35.5*  PLT 197   ------------------------------------------------------------------------------------------------------------------ Chemistries  Recent Labs  Lab 03/22/18 0351  NA 142  K 4.6  CL 106  CO2 31  GLUCOSE 189*  BUN 26*  CREATININE 0.72  CALCIUM 7.6*  MG 2.5*  AST 30  ALT 48*  ALKPHOS 38  BILITOT 0.5   RADIOLOGY:  Dg Chest Port 1 View  Result Date: 03/22/2018 CLINICAL DATA:  Pneumonia. EXAM:  PORTABLE CHEST 1 VIEW COMPARISON:  Radiograph of March 21, 2018. FINDINGS: Stable cardiomediastinal silhouette. Endotracheal tube is only 1 cm above the carina; withdrawal by 2-3 cm is recommended. Nasogastric tube is unchanged in position. Right internal jugular catheter is unchanged in position. Left-sided pacemaker is unchanged. No pneumothorax is noted. Stable right upper lobe opacity is noted consistent with pneumonia. Stable bibasilar opacities are noted, left greater than right, concerning for atelectasis and associated effusions. Bony thorax is unremarkable. IMPRESSION: Stable right upper lobe opacity is noted consistent with pneumonia. Stable bibasilar opacities as described above. Endotracheal tube is only 1 cm above the carina; withdrawal by 2-3 cm is recommended. Electronically Signed   By: Lupita Raider, M.D.   On: 03/22/2018 07:11   Dg Chest Port 1 View  Result Date: 03/21/2018 CLINICAL DATA:  Respiratory failure EXAM: PORTABLE CHEST 1 VIEW COMPARISON:  03/21/2018 and prior radiographs FINDINGS: An endotracheal tube with tip 3 cm above the carina, NG tube entering the stomach with tip off the field of view, RIGHT IJ central venous catheter with tip overlying the LOWER SVC and LEFT-sided pacemaker noted. Cardiomegaly with pulmonary vascular congestion identified. Bilateral pleural effusions and LEFT LOWER lung consolidation/atelectasis again noted. RIGHT mid lung opacity/consolidation is unchanged. There is no evidence of pneumothorax. IMPRESSION: RIGHT IJ central venous catheter placement without other significant change. No pneumothorax. Electronically Signed   By: Harmon Pier M.D.   On: 03/21/2018 14:11   ASSESSMENT AND PLAN:  This is a 42 year old male admitted  for respiratory failure.  1. Respiratory failure: Acute; with hypoxia: developing ARDS pattern. requiring full mechanical venti support - vent mgmt per PCCM, requiring 100% FiO2- likely due to flu with pna 2. Pneumonia:  Community-acquired; Abx changed to IV Zosyn 3. Influenza A: Continue oseltamivir 4. CHF: Echo shows EF 30-35%, H/o severe aortic stenosis s/p valve replacement 5. Elevated troponins: due to supply demand ischemia   Critically sick  All the records are reviewed and case discussed with Care Management/Social Worker. Management plans discussed with the patient, nursing and they are in agreement.  CODE STATUS: Full Code  TOTAL TIME TAKING CARE OF THIS PATIENT: 15 minutes.   More than 50% of the time was spent in counseling/coordination of care: Gwendolyn Lima M.D on 03/22/2018 at 10:33 AM  Between 7am to 6pm - Pager - [phone]  After 6pm go to [email or web address] - Social research officer, government  [facility]  Office  [phone]  CC: Primary care physician; Smitty Cords, DO  Note: This dictation was prepared with Dragon dictation along with smaller phrase technology. Any transcriptional errors that result from this process are unintentional.

## 2018-03-22 NOTE — Progress Notes (Signed)
Late note 10/24 Rough day. Unable to keep oxygen saturations up on normal sedation. MD and NP aware of low oxygen saturations. Right TLC and arterial line inserted by NP.  Patient paralyzed at 1405 on Nimbex  after all safety measures in place. Oxygenation immediately improved. Patient also had temperature most of the day - MD aware. Antibiotics changed and adjusted. Family in and out. Family updated as changes made.

## 2018-03-22 NOTE — Consult Note (Signed)
Pharmacy Antibiotic Note  Nicholas Horton is a 42 y.o. male admitted on 03/20/2018 with pneumonia.  Pharmacy has been consulted for Zosyn dosing. Patient also is influenza A +. Patient is currently intubated.  Plan: Procalcitonin is trending down. Will continue Zosyn EI 3.375g IV Q8hr.   Patient is also receiving Tamiflu 75 mg VT BID.  Height: 6\' 2"  (188 cm) Weight: 250 lb 10.6 oz (113.7 kg) IBW/kg (Calculated) : 82.2  Temp (24hrs), Avg:99.7 F (37.6 C), Min:97.3 F (36.3 C), Max:100.9 F (38.3 C)  Recent Labs  Lab 03/20/18 0235 03/20/18 0302 03/20/18 0444 03/20/18 0717 03/20/18 0915 03/21/18 0510 03/22/18 0351  WBC 9.8  --   --   --   --  10.6* 7.9  CREATININE 1.05  --   --   --   --  0.88 0.72  LATICACIDVEN  --  1.2 1.3 1.4 1.4  --   --     Estimated Creatinine Clearance: 162.9 mL/min (by C-G formula based on SCr of 0.72 mg/dL).    Allergies  Allergen Reactions  . Other     No NSAIDS r/t blood thinners    Antimicrobials this admission: Zosyn 10/24 >>  Azithromycin 10/23 >> 10/25 Ceftriaxone 10/23 >> 10/24  10/23 Vancomycin x 1   Dose adjustments this admission: N/A  Microbiology results: 10/23 BCx 2/2: no growth x 2 days   10/23 Respiratory Cx: no growth  10/23 MRSA PCR: (-)  Thank you for allowing pharmacy to be a part of this patient's care.   Crystale Giannattasio L 03/22/2018 8:07 PM

## 2018-03-22 NOTE — Progress Notes (Signed)
ANTICOAGULATION CONSULT NOTE - Initial Consult  Pharmacy Consult for heparin dosing  Indication: hypertriglyceridemia   Allergies  Allergen Reactions  . Other     No NSAIDS r/t blood thinners    Patient Measurements: Height: '6\' 2"'  (188 cm) Weight: 250 lb 10.6 oz (113.7 kg) IBW/kg (Calculated) : 82.2  Vital Signs: Temp: 100.4 F (38 C) (10/25 2100) Temp Source: Bladder (10/25 1930) BP: 125/70 (10/25 2100) Pulse Rate: 66 (10/25 2100)  Labs: Recent Labs    03/20/18 0235 03/20/18 0717 03/20/18 1234 03/20/18 1811 03/21/18 0510 03/22/18 0351 03/22/18 1950  HGB 14.1  --   --   --  12.0* 11.1*  --   HCT 42.9  --   --   --  36.9* 35.5*  --   PLT 173  --   --   --  169 197  --   LABPROT 20.3*  --   --   --  35.9* 35.2*  --   INR 1.76  --   --   --  3.67 3.58  --   HEPARINUNFRC  --   --   --   --   --   --  0.18*  CREATININE 1.05  --   --   --  0.88 0.72  --   TROPONINI 0.10* 0.06* 0.03* <0.03  --   --   --     Estimated Creatinine Clearance: 162.9 mL/min (by C-G formula based on SCr of 0.72 mg/dL).   Medical History: Past Medical History:  Diagnosis Date  . CHF (congestive heart failure) ([place])   . Congenital bicuspid aortic valve 09/2014   - s/p AVR  . Nonischemic cardiomyopathy ([place]) -- Resolved[I42.9] 09/2014   EF by Echo 20-25% (pre-op AVR) --> Echo 10/2014 & 03/2015: EF 50-55% - also Recent Normal Diastolic parameters   . S/P AVR (aortic valve replacement) 10/2014   21 mm Carbometrics Mechanical Valve -- Epicardial PPM Leads  . Severe aortic stenosis 10/04/2014   Presented with Syncope & CHF    Medications:  Scheduled:  . artificial tears  1 application Both Eyes P3X  . aspirin  81 mg Per Tube Daily  . buPROPion  200 mg Per Tube BID  . chlorhexidine gluconate (MEDLINE KIT)  15 mL Mouth Rinse BID  . cisatracurium  5 mg Intravenous Once  . docusate  100 mg Per Tube BID  . famotidine  20 mg Per Tube BID  . feeding supplement (PRO-STAT SUGAR FREE 64)  60 mL Per  Tube 5 X Daily  . feeding supplement (VITAL HIGH PROTEIN)  1,000 mL Per Tube Q24H  . furosemide  20 mg Intravenous Q12H  . insulin aspart  0-15 Units Subcutaneous Q4H  . mouth rinse  15 mL Mouth Rinse 10 times per day  . methylPREDNISolone (SOLU-MEDROL) injection  40 mg Intravenous BID  . multivitamin  15 mL Per Tube Daily  . OLANZapine  20 mg Per Tube QHS  . oseltamivir  75 mg Per Tube BID  . [START ON 03/23/2018] sertraline  50 mg Per Tube Daily   Infusions:  . cisatracurium (NIMBEX) infusion 3 mcg/kg/min (03/22/18 1900)  . fentaNYL infusion INTRAVENOUS 225 mcg/hr (03/22/18 2105)  . heparin 1,000 Units/hr (03/22/18 1900)  . midazolam (VERSED) infusion 1.5 mg/hr (03/22/18 1900)  . piperacillin-tazobactam (ZOSYN)  IV 3.375 g (03/22/18 2112)  . vancomycin      Assessment: Pharmacy consulted for heparin drip management for 42 yo male admitted with acute respiratory failure. Patient is  currently on ARDS protocol requiring cisatritrocrium infusion. Patient previously on propofol infusion and transitioned to midazolam due to hypertriglyceridemia. Patient on warfarin as an outpatient with stated goal of 2-3, however due to multiple risk factors (left ventricular dysfunction, CHF, pacemaker and AICD placement) patient's goal in house will be 2.5-3. INR remains elevated at 3.58.   Per discussion on ICU rounds, will initiate heparin 1000 units/hr with no bolus for hypertriglyceridemia and discontinue warfarin while patient is receiving heparin infusion. Goal heparin level is 0.3-0.5. Patient is to receive no boluses while INR is in therapeutic range.   Patient is getting triglyceride levels Q8hr and daily INRs.   Will coordinate with CCM when to discontinue heparin and resume warfarin.    Goal of Therapy:  INR 2.5-3 Heparin level 0.3-0.5 units/ml Monitor platelets by anticoagulation protocol: Yes   Plan:   10/25:  HL @ 20:30 = 0.17 Will increase heparin drip to 1200 units/hr and recheck  HL 6 hrs after rate change.   Pharmacy will continue to monitor and adjust per consult.   Deniel Mcquiston D 03/22/2018,10:05 PM

## 2018-03-22 NOTE — Care Management Note (Signed)
Case Management Note  Patient Details  Name: Nicholas Horton MRN: [id number] Date of Birth: 18-Sep-1975  Subjective/Objective:     Patient admitted with acute respiratory failure, positive for influenza A. Patient is currently intubated, sedated, paralyzed.  Significant other [name] is at the bedside and listed in epic as emergency contact.  RNCM spoke with [name] to see if they may be interested in LTAC, today is only day 2 but current patient condition could warrant long term care planning.  [name] reports that she will talk with the patient's father [name] (number in the room on the white board) to see what his thoughts may be.  She reports that she needs support and would appreciate a chaplin coming up to pray with her.  I will put in spiritual care consult for family and patient support.                   Action/Plan:  Continue to follow.  Expected Discharge Date:                  Expected Discharge Plan:     In-House Referral:     Discharge planning Services  CM Consult  Post Acute Care Choice:    Choice offered to:     DME Arranged:    DME Agency:     HH Arranged:    HH Agency:     Status of Service:  In process, will continue to follow  If discussed at Long Length of Stay Meetings, dates discussed:    Additional Comments:  Allayne Butcher, RN 03/22/2018, 12:06 PM

## 2018-03-22 NOTE — Progress Notes (Signed)
Name: Nicholas Horton MRN: [id number] DOB: 04/22/1976     CONSULTATION DATE: 03/20/2018  Subjective & objectives: Remains on propofol + fentanyl + Nimbex and T-max 100.6  PAST MEDICAL HISTORY :   has a past medical history of CHF (congestive heart failure) ([place]), Congenital bicuspid aortic valve (09/2014), Nonischemic cardiomyopathy ([place]) -- Resolved[I42.9] (09/2014), S/P AVR (aortic valve replacement) (10/2014), and Severe aortic stenosis (10/04/2014).  has a past surgical history that includes Surgery scrotal / testicular; Cardiac catheterization (N/A, 10/04/2014); Cardiac catheterization (10/04/2014); Cardiac catheterization (N/A, 11/25/2014); Multiple extractions with alveoloplasty (N/A, 11/25/2014); Aortic valve replacement (N/A, 12/01/2014); TEE without cardioversion (N/A, 12/01/2014); transthoracic echocardiogram (03/2015); and transthoracic echocardiogram (08/2017). Prior to Admission medications   Medication Sig Start Date End Date Taking? Authorizing Provider  albuterol (PROVENTIL HFA;VENTOLIN HFA) 108 (90 Base) MCG/ACT inhaler Inhale 2 puffs into the lungs every 6 (six) hours as needed for wheezing or shortness of breath. 11/23/17  Yes Loletha Grayer, MD  aspirin EC 81 MG EC tablet Take 1 tablet (81 mg total) by mouth daily. 10/07/14  Yes Emokpae, Ejiroghene E, MD  buPROPion (WELLBUTRIN SR) 200 MG 12 hr tablet Take 1 tablet (200 mg total) by mouth 2 (two) times daily. 12/24/17  Yes Karamalegos, Devonne Doughty, DO  dicyclomine (BENTYL) 10 MG capsule Take 10 mg by mouth 4 (four) times daily as needed. 11/27/17  Yes [provider]  diltiazem (CARDIZEM CD) 120 MG 24 hr capsule Take 1 capsule (120 mg total) by mouth daily. 03/04/18 06/02/18 Yes Leonie Man, MD  furosemide (LASIX) 40 MG tablet Take one tablet by mouth daily except mondays and thursdays take two tablet  and may take an extra one dose additional if needed Patient taking differently: Take 40 mg by mouth as directed. Take one tablet  by mouth daily except mondays and thursdays take two tablet  and may take an extra one dose additional if needed 03/04/18  Yes Leonie Man, MD  Glycopyrrolate-Formoterol (BEVESPI AEROSPHERE IN) Inhale 2 puffs into the lungs 2 (two) times daily.   Yes [provider]  losartan (COZAAR) 25 MG tablet Take 1 tablet (25 mg total) by mouth daily. 12/18/17  Yes Karamalegos, Devonne Doughty, DO  metFORMIN (GLUCOPHAGE) 1000 MG tablet Take 1 tablet (1,000 mg total) by mouth 2 (two) times daily. 12/18/17  Yes Karamalegos, Alexander J, DO  OLANZapine (ZYPREXA) 20 MG tablet Take 20 mg by mouth daily. 02/20/18  Yes [provider]  oseltamivir (TAMIFLU) 75 MG capsule Take 1 capsule (75 mg total) by mouth 2 (two) times daily. For 5 days 03/18/18  Yes Karamalegos, Devonne Doughty, DO  sertraline (ZOLOFT) 50 MG tablet Take 50 mg by mouth every morning. 12/13/17  Yes [provider]  warfarin (COUMADIN) 5 MG tablet Take 1.5 tablets (7.5 mg total) by mouth daily. 02/28/18  Yes Karamalegos, Devonne Doughty, DO  amoxicillin (AMOXIL) 500 MG capsule Take 2 capsules (1,000 mg total) by mouth as needed. With any  Procedures. 03/04/18   Leonie Man, MD  Blood Glucose Monitoring Suppl (ONE TOUCH ULTRA 2) w/Device KIT See admin instructions. 12/05/17   [provider]  OLANZapine (ZYPREXA) 2.5 MG tablet Take 8 tablets (20 mg total) by mouth at bedtime. Patient not taking: Reported on 03/20/2018 11/23/17 11/23/18  Loletha Grayer, MD   Allergies  Allergen Reactions  . Other     No NSAIDS r/t blood thinners    FAMILY HISTORY:  family history is not on file. SOCIAL HISTORY:  reports that  he has quit smoking. His smoking use included cigarettes. He has a 40.00 pack-year smoking history. He has quit using smokeless tobacco. He reports that he does not drink alcohol or use drugs.  REVIEW OF SYSTEMS:   Unable to obtain due to critical illness   VITAL SIGNS: Temp:  [97.3 F (36.3 C)-102 F (38.9  C)] 100.8 F (38.2 C) (10/25 1400) Pulse Rate:  [60-102] 95 (10/25 1400) Resp:  [0-28] 22 (10/25 1400) BP: (97-136)/(57-84) 131/81 (10/25 1400) SpO2:  [90 %-100 %] 92 % (10/25 1400) Arterial Line BP: (108-148)/(54-72) 144/72 (10/25 1400) FiO2 (%):  [100 %] 100 % (10/25 1200) Weight:  [113.7 kg] 113.7 kg (10/25 0242)  Physical Examination:  Sedated and medically paralyzed On ventilator, no distress, bilateral equal air entry with no adventitious sounds S1 & S2 are audible with no murmur Benign abdominal exam with feeble peristalsis No leg edema   ASSESSMENT / PLAN: Acute respiratory failure with ARDS.  PF ratio 73 100% PEEP of 10 was PIP 24. -Low-dose steroid, Nimbex, low tidal volume lung protective ventilator settings with permissive hypercapnia. -Monitor ABG, optimize vent settings to keep PIP less than 30 as tolerated and consider referral for ECMO if no improvement.  Pneumonia was influenza A.    Improved airspace disease of left lower lobe and right middle lobe, pulmonary congestion and bilateral pleural effusion. -Tamiflu, empiric Zosyn + Zithromax.  MRSA PCR negative respiratory culture negative. -Monitor CXR + CBC + FiO2 -Optimize diuresis to improve lung compliance, maintain CVP < 8 as renal function tolerates.  Elevated troponin with history of nonischemic cardiomyopathy status post AICD and status post aortic valve replacement for bicuspid aortic valve.  Echo 03/20/2018 LVEF 30 to 35% nonischemic cardiomyopathy. -Optimize heparin drip -Follow with cardiology evaluation  Sepsis with procalcitonin 1.88 down from 1.95.  MRSA PCR negative, blood culture and respiratory culture remains negative -Zosyn + Zithromax -Procalcitonin 1.95 trended down to 1.06  Prerenal azotemia with intravascular volume depletion and secondary to catabolic effect of steroids. -Optimize volume, avoid nephrotoxins, monitor renal panel and urine output  Hypertriglyceridemia was  propofol -DC propofol, start on heparin infusion and monitor triglyceride level -Consider starting insulin drip if triglycerides continue to be elevated  Anemia -Keep hemoglobin more than 7 g/dL  Coagulopathy secondary to warfarin. -Currently patient on heparin drip as management for hypertriglyceridemia and anticoagulation -Monitor coags  Full code  DVT & GI prophylaxis.  Continue with supportive care  Patient fianc at the bedside was updated and she agreed to the plan of care  Critical care time 40 minutes

## 2018-03-22 NOTE — Consult Note (Signed)
MEDICATION RELATED CONSULT NOTE - INITIAL   Pharmacy Consult for Electrolytes  Pharmacy consulted to assist in monitoring and replacing electrolytes in this 42 y.o. male admitted on 03/20/2018 with pneumonia. Patient also has influenza A+.   Assessment/Plan:   Goals: potassium ~ 4.0 and goal magnesium ~ 2.0  Patient ordered  furosemide 20 mg IV BID . Patient's electrolytes are WNL. No supplementation is required at this time.   Will recheck electrolytes with am labs.   Pharmacy will continue to follow and adjust the electrolytes as needed.  Simpson,Michael L, 03/22/2018,8:03 PM

## 2018-03-23 ENCOUNTER — Inpatient Hospital Stay: Payer: Medicare HMO

## 2018-03-23 LAB — BASIC METABOLIC PANEL
Anion gap: 8 (ref 5–15)
BUN: 35 mg/dL — ABNORMAL HIGH (ref 6–20)
CO2: 38 mmol/L — ABNORMAL HIGH (ref 22–32)
Calcium: 8.3 mg/dL — ABNORMAL LOW (ref 8.9–10.3)
Chloride: 102 mmol/L (ref 98–111)
Creatinine, Ser: 0.76 mg/dL (ref 0.61–1.24)
GFR calc Af Amer: >60 mL/min (ref >60)
GFR calc non Af Amer: >60 mL/min (ref >60)
Glucose, Bld: 175 mg/dL — ABNORMAL HIGH (ref 70–99)
Potassium: 4.5 mmol/L (ref 3.5–5.1)
Sodium: 148 mmol/L — ABNORMAL HIGH (ref 135–145)

## 2018-03-23 LAB — COMPREHENSIVE METABOLIC PANEL
ALT: 125 U/L — AB (ref 0–44)
ANION GAP: 5 (ref 5–15)
AST: 66 U/L — AB (ref 15–41)
Albumin: 2.8 g/dL — ABNORMAL LOW (ref 3.5–5.0)
Alkaline Phosphatase: 39 U/L (ref 38–126)
BUN: 32 mg/dL — ABNORMAL HIGH (ref 6–20)
CHLORIDE: 106 mmol/L (ref 98–111)
CO2: 36 mmol/L — ABNORMAL HIGH (ref 22–32)
Calcium: 7.9 mg/dL — ABNORMAL LOW (ref 8.9–10.3)
Creatinine, Ser: 0.72 mg/dL (ref 0.61–1.24)
GFR calc non Af Amer: >60 mL/min (ref >60)
GLUCOSE: 179 mg/dL — AB (ref 70–99)
Potassium: 4.4 mmol/L (ref 3.5–5.1)
SODIUM: 147 mmol/L — AB (ref 135–145)
TOTAL PROTEIN: 6.4 g/dL — AB (ref 6.5–8.1)
Total Bilirubin: 0.9 mg/dL (ref 0.3–1.2)

## 2018-03-23 LAB — GLUCOSE, CAPILLARY
Glucose-Capillary: 154 mg/dL — ABNORMAL HIGH (ref 70–99)
Glucose-Capillary: 147 mg/dL — ABNORMAL HIGH (ref 70–99)
Glucose-Capillary: 168 mg/dL — ABNORMAL HIGH (ref 70–99)
Glucose-Capillary: 167 mg/dL — ABNORMAL HIGH (ref 70–99)
Glucose-Capillary: 159 mg/dL — ABNORMAL HIGH (ref 70–99)
Glucose-Capillary: 165 mg/dL — ABNORMAL HIGH (ref 70–99)

## 2018-03-23 LAB — BLOOD GAS, ARTERIAL
ACID-BASE EXCESS: 13.6 mmol/L — AB (ref 0.0–2.0)
Acid-Base Excess: 7.5 mmol/L — ABNORMAL HIGH (ref 0.0–2.0)
BICARBONATE: 35.6 mmol/L — AB (ref 20.0–28.0)
BICARBONATE: 43.2 mmol/L — AB (ref 20.0–28.0)
FIO2: 0.95
FIO2: 95
MECHVT: 400 mL
MECHVT: 400 mL
Mechanical Rate: 28
O2 SAT: 88.7 %
O2 Saturation: 94.1 %
PATIENT TEMPERATURE: 37
PATIENT TEMPERATURE: 37
PCO2 ART: 82 mmHg — AB (ref 32.0–48.0)
PEEP/CPAP: 10 cmH2O
PEEP: 10 cmH2O
PH ART: 7.33 — AB (ref 7.350–7.450)
PO2 ART: 74 mmHg — AB (ref 83.0–108.0)
PO2 ART: 60 mmHg — AB (ref 83.0–108.0)
RATE: 28 resp/min
pCO2 arterial: 63 mmHg — ABNORMAL HIGH (ref 32.0–48.0)
pH, Arterial: 7.36 (ref 7.350–7.450)

## 2018-03-23 LAB — CBC WITH DIFFERENTIAL/PLATELET
Abs Immature Granulocytes: 0.41 K/uL — ABNORMAL HIGH (ref 0.00–0.07)
Basophils Absolute: 0.1 K/uL (ref 0.0–0.1)
Basophils Relative: 1 %
Eosinophils Absolute: 0 K/uL (ref 0.0–0.5)
Eosinophils Relative: 0 %
HCT: 34.8 % — ABNORMAL LOW (ref 39.0–52.0)
Hemoglobin: 10.9 g/dL — ABNORMAL LOW (ref 13.0–17.0)
Immature Granulocytes: 4 %
Lymphocytes Relative: 12 %
Lymphs Abs: 1.2 K/uL (ref 0.7–4.0)
MCH: 29.8 pg (ref 26.0–34.0)
MCHC: 31.3 g/dL (ref 30.0–36.0)
MCV: 95.1 fL (ref 80.0–100.0)
Monocytes Absolute: 0.6 K/uL (ref 0.1–1.0)
Monocytes Relative: 6 %
Neutro Abs: 7.6 K/uL (ref 1.7–7.7)
Neutrophils Relative %: 77 %
Platelets: 244 K/uL (ref 150–400)
RBC: 3.66 MIL/uL — ABNORMAL LOW (ref 4.22–5.81)
RDW: 14 % (ref 11.5–15.5)
WBC: 9.9 K/uL (ref 4.0–10.5)
nRBC: 0.2 % (ref 0.0–0.2)

## 2018-03-23 LAB — TRIGLYCERIDES
TRIGLYCERIDES: 369 mg/dL — AB (ref <150)
TRIGLYCERIDES: 349 mg/dL — AB (ref <150)
TRIGLYCERIDES: 394 mg/dL — AB (ref <150)

## 2018-03-23 LAB — HEPARIN LEVEL (UNFRACTIONATED)
HEPARIN UNFRACTIONATED: 0.16 [IU]/mL — AB (ref 0.30–0.70)
Heparin Unfractionated: 0.15 IU/mL — ABNORMAL LOW (ref 0.30–0.70)
Heparin Unfractionated: 0.34 IU/mL (ref 0.30–0.70)

## 2018-03-23 LAB — MAGNESIUM
Magnesium: 2.6 mg/dL — ABNORMAL HIGH (ref 1.7–2.4)
Magnesium: 2.5 mg/dL — ABNORMAL HIGH (ref 1.7–2.4)

## 2018-03-23 LAB — PROTIME-INR
INR: 1.93
Prothrombin Time: 21.8 seconds — ABNORMAL HIGH (ref 11.4–15.2)

## 2018-03-23 LAB — PHOSPHORUS
Phosphorus: 2.6 mg/dL (ref 2.5–4.6)
Phosphorus: 3.9 mg/dL (ref 2.5–4.6)

## 2018-03-23 LAB — CALCIUM, IONIZED: CALCIUM, IONIZED, SERUM: 4.2 mg/dL — AB (ref 4.5–5.6)

## 2018-03-23 MED ORDER — POLYETHYLENE GLYCOL 3350 17 G PO PACK
17.0000 g | PACK | Freq: Every day | ORAL | Status: DC
  Administered 2018-03-23: 17 g
  Filled 2018-03-23: qty 1

## 2018-03-23 MED ORDER — FUROSEMIDE 10 MG/ML IJ SOLN
40.0000 mg | Freq: Once | INTRAMUSCULAR | Status: AC
  Administered 2018-03-23: 40 mg via INTRAVENOUS
  Filled 2018-03-23: qty 4

## 2018-03-23 MED ORDER — SENNOSIDES-DOCUSATE SODIUM 8.6-50 MG PO TABS
2.0000 | ORAL_TABLET | Freq: Two times a day (BID) | ORAL | Status: DC
  Administered 2018-03-23 (×2): 2
  Filled 2018-03-23 (×2): qty 2

## 2018-03-23 MED ORDER — STERILE WATER FOR INJECTION IV SOLN
INTRAVENOUS | Status: DC
  Administered 2018-03-23: 21:00:00 via INTRAVENOUS
  Filled 2018-03-23 (×2): qty 850

## 2018-03-23 MED ORDER — VANCOMYCIN HCL IN DEXTROSE 1-5 GM/200ML-% IV SOLN
1000.0000 mg | Freq: Three times a day (TID) | INTRAVENOUS | Status: DC
  Administered 2018-03-23: 1000 mg via INTRAVENOUS
  Filled 2018-03-23 (×3): qty 200

## 2018-03-23 MED ORDER — FUROSEMIDE 10 MG/ML IJ SOLN: 20.0000 mg | Freq: Two times a day (BID) | INTRAMUSCULAR | Status: DC

## 2018-03-23 MED ORDER — IPRATROPIUM-ALBUTEROL 0.5-2.5 (3) MG/3ML IN SOLN
3.0000 mL | Freq: Four times a day (QID) | RESPIRATORY_TRACT | Status: DC
  Administered 2018-03-23 – 2018-03-24 (×5): 3 mL via RESPIRATORY_TRACT
  Filled 2018-03-23 (×5): qty 3

## 2018-03-23 MED ORDER — SODIUM BICARBONATE 8.4 % IV SOLN
50.0000 meq | Freq: Once | INTRAVENOUS | Status: AC
  Administered 2018-03-23: 50 meq via INTRAVENOUS
  Filled 2018-03-23: qty 50

## 2018-03-23 MED ORDER — HEPARIN BOLUS VIA INFUSION
3000.0000 [IU] | Freq: Once | INTRAVENOUS | Status: AC
  Administered 2018-03-23: 3000 [IU] via INTRAVENOUS
  Filled 2018-03-23: qty 3000

## 2018-03-23 MED ORDER — FUROSEMIDE 10 MG/ML IJ SOLN
20.0000 mg | Freq: Every day | INTRAMUSCULAR | Status: DC
  Administered 2018-03-23 – 2018-03-24 (×2): 20 mg via INTRAVENOUS
  Filled 2018-03-23 (×3): qty 2

## 2018-03-23 NOTE — Progress Notes (Signed)
ANTICOAGULATION CONSULT NOTE - Initial Consult  Pharmacy Consult for heparin dosing  Indication: hypertriglyceridemia   Allergies  Allergen Reactions  . Other     No NSAIDS r/t blood thinners    Patient Measurements: Height: _0  (188 cm) Weight: 249 lb 9 oz (113.2 kg) IBW/kg (Calculated) : 82.2  Vital Signs: Temp: 99.7 F (37.6 C) (10/26 0530) Temp Source: Bladder (10/26 0400) BP: 117/69 (10/26 0530) Pulse Rate: 60 (10/26 0530)  Labs: Recent Labs    03/20/18 0717 03/20/18 1234 03/20/18 1811  03/21/18 0510 03/22/18 0351 03/22/18 1950 03/23/18 0415  HGB  --   --   --    < > 12.0* 11.1*  --  10.9*  HCT  --   --   --   --  36.9* 35.5*  --  34.8*  PLT  --   --   --   --  169 197  --  244  LABPROT  --   --   --   --  35.9* 35.2*  --  21.8*  INR  --   --   --   --  3.67 3.58  --  1.93  HEPARINUNFRC  --   --   --   --   --   --  0.18* 0.15*  CREATININE  --   --   --   --  0.88 0.72  --   --   TROPONINI 0.06* 0.03* <0.03  --   --   --   --   --    < > = values in this interval not displayed.    Estimated Creatinine Clearance: 162.6 mL/min (by C-G formula based on SCr of 0.72 mg/dL).   Medical History: Past Medical History:  Diagnosis Date  . CHF (congestive heart failure) ([place])   . Congenital bicuspid aortic valve 09/2014   - s/p AVR  . Nonischemic cardiomyopathy ([place]) -- Resolved[I42.9] 09/2014   EF by Echo 20-25% (pre-op AVR) --> Echo 10/2014 & 03/2015: EF 50-55% - also Recent Normal Diastolic parameters   . S/P AVR (aortic valve replacement) 10/2014   21 mm Carbometrics Mechanical Valve -- Epicardial PPM Leads  . Severe aortic stenosis 10/04/2014   Presented with Syncope & CHF    Medications:  Scheduled:  . artificial tears  1 application Both Eyes Q3R  . aspirin  81 mg Per Tube Daily  . buPROPion  200 mg Per Tube BID  . chlorhexidine gluconate (MEDLINE KIT)  15 mL Mouth Rinse BID  . cisatracurium  5 mg Intravenous Once  . docusate  100 mg Per Tube BID  .  famotidine  20 mg Per Tube BID  . feeding supplement (PRO-STAT SUGAR FREE 64)  60 mL Per Tube 5 X Daily  . feeding supplement (VITAL HIGH PROTEIN)  1,000 mL Per Tube Q24H  . furosemide  20 mg Intravenous Q12H  . insulin aspart  0-15 Units Subcutaneous Q4H  . mouth rinse  15 mL Mouth Rinse 10 times per day  . methylPREDNISolone (SOLU-MEDROL) injection  40 mg Intravenous BID  . multivitamin  15 mL Per Tube Daily  . OLANZapine  20 mg Per Tube QHS  . oseltamivir  75 mg Per Tube BID  . sertraline  50 mg Per Tube Daily   Infusions:  . cisatracurium (NIMBEX) infusion 3 mcg/kg/min (03/23/18 0400)  . fentaNYL infusion INTRAVENOUS 225 mcg/hr (03/23/18 0400)  . heparin 1,200 Units/hr (03/23/18 0300)  . midazolam (VERSED) infusion 1.5 mg/hr (03/23/18 0400)  .  piperacillin-tazobactam (ZOSYN)  IV 3.375 g (03/23/18 0518)  . vancomycin 200 mL/hr at 03/23/18 0400    Assessment: Pharmacy consulted for heparin drip management for 42 yo male admitted with acute respiratory failure. Patient is currently on ARDS protocol requiring cisatritrocrium infusion. Patient previously on propofol infusion and transitioned to midazolam due to hypertriglyceridemia. Patient on warfarin as an outpatient with stated goal of 2-3, however due to multiple risk factors (left ventricular dysfunction, CHF, pacemaker and AICD placement) patient's goal in house will be 2.5-3. INR remains elevated at 3.58.   Per discussion on ICU rounds, will initiate heparin 1000 units/hr with no bolus for hypertriglyceridemia and discontinue warfarin while patient is receiving heparin infusion. Goal heparin level is 0.3-0.5. Patient is to receive no boluses while INR is in therapeutic range.   Patient is getting triglyceride levels Q8hr and daily INRs.   Will coordinate with CCM when to discontinue heparin and resume warfarin.    Goal of Therapy:  INR 2.5-3 Heparin level 0.3-0.5 units/ml Monitor platelets by anticoagulation protocol: Yes    Plan:  10/26 @ 0400 HL 0.15 subtherapeutic. Will increase rate to 1350 units/hr and will recheck HL @ 1000, h/h trending down will continue to monitor. Last TG 374 continues to decline, will continue to monitor.  Tobie Lords, PharmD, BCPS Clinical Pharmacist 03/23/2018

## 2018-03-23 NOTE — Progress Notes (Signed)
[facility] for heparin dosing  Indication: hypertriglyceridemia   Allergies  Allergen Reactions  . Other     No NSAIDS r/t blood thinners    Patient Measurements: Height: _0  (188 cm) Weight: 249 lb 9 oz (113.2 kg) IBW/kg (Calculated) : 82.2  Vital Signs: Temp: 99.5 F (37.5 C) (10/26 1100) Temp Source: Bladder (10/26 1100) BP: 136/74 (10/26 1100) Pulse Rate: 81 (10/26 1100)  Labs: Recent Labs    03/20/18 1234 03/20/18 1811  03/21/18 0510 03/22/18 0351 03/22/18 1950 03/23/18 0415 03/23/18 1006  HGB  --   --    < > 12.0* 11.1*  --  10.9*  --   HCT  --   --   --  36.9* 35.5*  --  34.8*  --   PLT  --   --   --  169 197  --  244  --   LABPROT  --   --   --  35.9* 35.2*  --  21.8*  --   INR  --   --   --  3.67 3.58  --  1.93  --   HEPARINUNFRC  --   --   --   --   --  0.18* 0.15* 0.16*  CREATININE  --   --   --  0.88 0.72  --  0.72  --   TROPONINI 0.03* <0.03  --   --   --   --   --   --    < > = values in this interval not displayed.    Estimated Creatinine Clearance: 162.6 mL/min (by C-G formula based on SCr of 0.72 mg/dL).   Medical History: Past Medical History:  Diagnosis Date  . CHF (congestive heart failure) ([place])   . Congenital bicuspid aortic valve 09/2014   - s/p AVR  . Nonischemic cardiomyopathy ([place]) -- Resolved[I42.9] 09/2014   EF by Echo 20-25% (pre-op AVR) --> Echo 10/2014 & 03/2015: EF 50-55% - also Recent Normal Diastolic parameters   . S/P AVR (aortic valve replacement) 10/2014   21 mm Carbometrics Mechanical Valve -- Epicardial PPM Leads  . Severe aortic stenosis 10/04/2014   Presented with Syncope & CHF    Medications:  Scheduled:  . artificial tears  1 application Both Eyes Y6V  . aspirin  81 mg Per Tube Daily  . buPROPion  200 mg Per Tube BID  . chlorhexidine gluconate (MEDLINE KIT)  15 mL Mouth Rinse BID  . cisatracurium  5 mg Intravenous Once  . famotidine  20 mg Per Tube BID  . feeding supplement  (PRO-STAT SUGAR FREE 64)  60 mL Per Tube 5 X Daily  . feeding supplement (VITAL HIGH PROTEIN)  1,000 mL Per Tube Q24H  . furosemide  20 mg Intravenous Q12H  . furosemide  20 mg Intravenous Daily  . insulin aspart  0-15 Units Subcutaneous Q4H  . ipratropium-albuterol  3 mL Nebulization Q6H  . mouth rinse  15 mL Mouth Rinse 10 times per day  . methylPREDNISolone (SOLU-MEDROL) injection  40 mg Intravenous BID  . multivitamin  15 mL Per Tube Daily  . oseltamivir  75 mg Per Tube BID  . polyethylene glycol  17 g Per Tube Daily  . senna-docusate  2 tablet Per Tube BID   Infusions:  . cisatracurium (NIMBEX) infusion 2 mcg/kg/min (03/23/18 1100)  . fentaNYL infusion INTRAVENOUS 250 mcg/hr (03/23/18 1100)  . heparin 1,350 Units/hr (03/23/18 1135)  . midazolam (VERSED) infusion  1.5 mg/hr (03/23/18 1100)  . piperacillin-tazobactam (ZOSYN)  IV Stopped (03/23/18 0920)    Assessment: Pharmacy consulted for heparin drip management for 42 yo male admitted with acute respiratory failure. Patient is currently on ARDS protocol requiring cisatritrocrium infusion. Patient previously on propofol infusion and transitioned to midazolam due to hypertriglyceridemia. Patient on warfarin as an outpatient with stated goal of 2-3, however due to multiple risk factors (left ventricular dysfunction, CHF, pacemaker and AICD placement) patient's goal in house will be 2.5-3. INR was elevated at 3.58. Warfarin for Aortic valve replacement  Per discussion on ICU rounds, will initiate heparin 1000 units/hr with no bolus for hypertriglyceridemia and discontinue warfarin while patient is receiving heparin infusion. Goal heparin level is 0.3-0.5. Patient is to receive no boluses while INR is in therapeutic range.   Patient is getting triglyceride levels Q8hr and daily INRs.   Will coordinate with CCM when to discontinue heparin and resume warfarin.    Goal of Therapy:  INR 2.5-3 Heparin level 0.3-0.5 units/ml Monitor  platelets by anticoagulation protocol: Yes   Plan:  10/26 @ 0400 HL 0.15 subtherapeutic. Will increase rate to 1350 units/hr and will recheck HL @ 1000, h/h trending down will continue to monitor. Last TG 374 continues to decline, will continue to monitor.  10/26 1006 HL= 0.16 . INR 1.93. TG 369. Will order bolus of 3000 units and increase drip to 1650 units/hr for goal HL 0.3-0.5. Will recheck in 6 hrs.  Chinita Greenland PharmD Clinical Pharmacist 03/23/2018

## 2018-03-23 NOTE — Progress Notes (Signed)
Pharmacy Antibiotic Note  Nicholas Horton is a 42 y.o. male admitted on 03/20/2018 with pneumonia.  Pharmacy has been consulted for vancomcyin dosing for pneumonia in patient that is flu+, which places him at risk for staph/strep infection. Patient received vanc 1g IV x 1  Plan: Will continue vanc 1g IV q8h  Will draw vanc trough 10/27 @ 0300 prior to 4th dose.  Ke 0.106 T1/2 8 hrs Goal trough 15 - 20 mcg/mL  Height: 6\' 2"  (188 cm) Weight: 250 lb 10.6 oz (113.7 kg) IBW/kg (Calculated) : 82.2  Temp (24hrs), Avg:100 F (37.8 C), Min:97.3 F (36.3 C), Max:100.9 F (38.3 C)  Recent Labs  Lab 03/20/18 0235 03/20/18 0302 03/20/18 0444 03/20/18 0717 03/20/18 0915 03/21/18 0510 03/22/18 0351  WBC 9.8  --   --   --   --  10.6* 7.9  CREATININE 1.05  --   --   --   --  0.88 0.72  LATICACIDVEN  --  1.2 1.3 1.4 1.4  --   --     Estimated Creatinine Clearance: 162.9 mL/min (by C-G formula based on SCr of 0.72 mg/dL).    Allergies  Allergen Reactions  . Other     No NSAIDS r/t blood thinners    Thank you for allowing pharmacy to be a part of this patient's care.  Thomasene Ripple, PharmD, BCPS Clinical Pharmacist 03/23/2018

## 2018-03-23 NOTE — Consult Note (Signed)
Pharmacy Antibiotic Note  Nicholas Horton is a 42 y.o. male admitted on 03/20/2018 with pneumonia.  Pharmacy has been consulted for Zosyn dosing. Patient also is influenza A +. Patient is currently intubated.  Plan: Procalcitonin is trending down. Will continue Zosyn EI 3.375g IV Q8hr. Zosyn started 03/21/18   Patient is also receiving Tamiflu 75 mg VT BID.  Height: 6\' 2"  (188 cm) Weight: 249 lb 9 oz (113.2 kg) IBW/kg (Calculated) : 82.2  Temp (24hrs), Avg:100.3 F (37.9 C), Min:99.7 F (37.6 C), Max:100.9 F (38.3 C)  Recent Labs  Lab 03/20/18 0235 03/20/18 0302 03/20/18 0444 03/20/18 0717 03/20/18 0915 03/21/18 0510 03/22/18 0351 03/23/18 0415  WBC 9.8  --   --   --   --  10.6* 7.9 9.9  CREATININE 1.05  --   --   --   --  0.88 0.72 0.72  LATICACIDVEN  --  1.2 1.3 1.4 1.4  --   --   --     Estimated Creatinine Clearance: 162.6 mL/min (by C-G formula based on SCr of 0.72 mg/dL).    Allergies  Allergen Reactions  . Other     No NSAIDS r/t blood thinners    Antimicrobials this admission: Zosyn 10/24 >>  Azithromycin 10/23 >> 10/25 Ceftriaxone 10/23 >> 10/24  10/23 Vancomycin x 1   Dose adjustments this admission: N/A  Microbiology results: 10/23 BCx 2/2: no growth x 2 days   10/23 Respiratory Cx: no growth  10/23 MRSA PCR: (-)  Thank you for allowing pharmacy to be a part of this patient's care.   Mahaley Schwering A 03/23/2018 9:18 AM

## 2018-03-23 NOTE — Progress Notes (Signed)
[employer] [facility] [place] [facility]   PATIENT NAME: Nicholas Horton    MR#:  [id number]  DATE OF BIRTH:  1976-01-23  SUBJECTIVE: Currently ill, intubated, sedated.  Still on high FiO2 at 95%.  CHIEF COMPLAINT:   Chief Complaint  Patient presents with  . Shortness of Breath   REVIEW OF SYSTEMS:  Review of Systems  Unable to perform ROS: Critical illness   DRUG ALLERGIES:   Allergies  Allergen Reactions  . Other     No NSAIDS r/t blood thinners   VITALS:  Blood pressure 136/74, pulse 81, temperature 99.5 F (37.5 C), temperature source Bladder, resp. rate (!) 28, height 6\' 2"  (1.88 m), weight 113.2 kg, SpO2 93 %. PHYSICAL EXAMINATION:  Physical Exam  HENT:  Head: Normocephalic and atraumatic.  ett in place  Eyes: Pupils are equal, round, and reactive to light. Conjunctivae and EOM are normal.  Neck: Normal range of motion. Neck supple. No tracheal deviation present. No thyromegaly present.  Cardiovascular: Normal rate, regular rhythm and normal heart sounds.  Pulmonary/Chest: Effort normal and breath sounds normal. No respiratory distress. He has no wheezes. He exhibits no tenderness.  Abdominal: Soft. Bowel sounds are normal. He exhibits no distension. There is no tenderness.  Musculoskeletal: Normal range of motion.  Neurological: No cranial nerve deficit.  Sedated on vent  Skin: Skin is warm and dry. No rash noted.   LABORATORY PANEL:  Male CBC Recent Labs  Lab 03/23/18 0415  WBC 9.9  HGB 10.9*  HCT 34.8*  PLT 244   ------------------------------------------------------------------------------------------------------------------ Chemistries  Recent Labs  Lab 03/23/18 0415  NA 147*  K 4.4  CL 106  CO2 36*  GLUCOSE 179*  BUN 32*  CREATININE 0.72  CALCIUM 7.9*  MG 2.6*  AST 66*  ALT 125*  ALKPHOS 39  BILITOT 0.9   RADIOLOGY:  Dg Chest Port 1 View  Result Date: 03/23/2018 CLINICAL DATA:  Pneumonia. EXAM: PORTABLE CHEST 1  VIEW COMPARISON:  March 22, 2017 FINDINGS: The ETT is in good position. The NG tube terminates below today's film. The right IJ is stable. No pneumothorax. Stable right upper lobe infiltrate and bibasilar opacities. Possible small layering effusions. Cardiomediastinal silhouette stable. IMPRESSION: No interval change in the right upper lobe infiltrate or the bibasilar opacities. Suspect associated effusions. Support apparatus in good position. Electronically Signed   By: Gerome Sam III M.D   On: 03/23/2018 07:24   ASSESSMENT AND PLAN:  This is a 42 year old male admitted for respiratory failure.  1. Respiratory failure: Acute; with hypoxia: developing ARDS pattern. requiring full mechanical venti support - vent mgmt per PCCM, putting 95% FiO2.  Patient is on PEEP 10 2. Pneumonia: Community-acquired; Abx changed to IV Zosyn 3. Influenza A: Continue oseltamivir 4. CHF: Echo shows EF 30-35%, H/o severe aortic stenosis s/p valve replacement, patient had AICD placement, patient is on heparin drip. 5. Elevated troponins: due to supply demand ischemia 6 sepsis due to pneumonia, flu: Continue Zosyn, Zithromax Patient condition critically ill with acute respiratory failure with ARDS secondary to pneumonia and influenza. Critically sick 7. elevated LFTs: Holding Zoloft, Zyprexa.  Monitor the trend. #8 /supratherapeutic INR: INR 3.58 decreased 1.93.  Hold Coumadin, continue heparin.  High for cardiac arrest. All the records are reviewed and case discussed with Care Management/Social Worker. Management plans discussed with the patient, nursing and they are in agreement.  CODE STATUS: Full Code  TOTAL TIME TAKING CARE OF THIS PATIENT: 25 minutes.   More  than 50% of the time was spent in counseling/coordination of care: Wylene Men M.D on 03/23/2018 at 12:30 PM  Between 7am to 6pm - Pager - [phone]  After 6pm go to [email or web address] - Social research officer, government  [facility]  Office  [phone]  CC: Primary care physician; Smitty Cords, DO  Note: This dictation was prepared with Dragon dictation along with smaller phrase technology. Any transcriptional errors that result from this process are unintentional.

## 2018-03-23 NOTE — Progress Notes (Addendum)
Name: Nicholas Horton MRN: [id number] DOB: 1975/09/24     CONSULTATION DATE: 03/20/2018  Subjective & Objectives: fentanyl + Versed + Nimbex + Heparin.   PAST MEDICAL HISTORY :   has a past medical history of CHF (congestive heart failure) ([place]), Congenital bicuspid aortic valve (09/2014), Nonischemic cardiomyopathy ([place]) -- Resolved[I42.9] (09/2014), S/P AVR (aortic valve replacement) (10/2014), and Severe aortic stenosis (10/04/2014).  has a past surgical history that includes Surgery scrotal / testicular; Cardiac catheterization (N/A, 10/04/2014); Cardiac catheterization (10/04/2014); Cardiac catheterization (N/A, 11/25/2014); Multiple extractions with alveoloplasty (N/A, 11/25/2014); Aortic valve replacement (N/A, 12/01/2014); TEE without cardioversion (N/A, 12/01/2014); transthoracic echocardiogram (03/2015); and transthoracic echocardiogram (08/2017). Prior to Admission medications   Medication Sig Start Date End Date Taking? Authorizing Provider  albuterol (PROVENTIL HFA;VENTOLIN HFA) 108 (90 Base) MCG/ACT inhaler Inhale 2 puffs into the lungs every 6 (six) hours as needed for wheezing or shortness of breath. 11/23/17  Yes Loletha Grayer, MD  aspirin EC 81 MG EC tablet Take 1 tablet (81 mg total) by mouth daily. 10/07/14  Yes Emokpae, Ejiroghene E, MD  buPROPion (WELLBUTRIN SR) 200 MG 12 hr tablet Take 1 tablet (200 mg total) by mouth 2 (two) times daily. 12/24/17  Yes Karamalegos, Devonne Doughty, DO  dicyclomine (BENTYL) 10 MG capsule Take 10 mg by mouth 4 (four) times daily as needed. 11/27/17  Yes [provider]  diltiazem (CARDIZEM CD) 120 MG 24 hr capsule Take 1 capsule (120 mg total) by mouth daily. 03/04/18 06/02/18 Yes Leonie Man, MD  furosemide (LASIX) 40 MG tablet Take one tablet by mouth daily except mondays and thursdays take two tablet  and may take an extra one dose additional if needed Patient taking differently: Take 40 mg by mouth as directed. Take one tablet by mouth daily  except mondays and thursdays take two tablet  and may take an extra one dose additional if needed 03/04/18  Yes Leonie Man, MD  Glycopyrrolate-Formoterol (BEVESPI AEROSPHERE IN) Inhale 2 puffs into the lungs 2 (two) times daily.   Yes [provider]  losartan (COZAAR) 25 MG tablet Take 1 tablet (25 mg total) by mouth daily. 12/18/17  Yes Karamalegos, Devonne Doughty, DO  metFORMIN (GLUCOPHAGE) 1000 MG tablet Take 1 tablet (1,000 mg total) by mouth 2 (two) times daily. 12/18/17  Yes Karamalegos, Alexander J, DO  OLANZapine (ZYPREXA) 20 MG tablet Take 20 mg by mouth daily. 02/20/18  Yes [provider]  oseltamivir (TAMIFLU) 75 MG capsule Take 1 capsule (75 mg total) by mouth 2 (two) times daily. For 5 days 03/18/18  Yes Karamalegos, Devonne Doughty, DO  sertraline (ZOLOFT) 50 MG tablet Take 50 mg by mouth every morning. 12/13/17  Yes [provider]  warfarin (COUMADIN) 5 MG tablet Take 1.5 tablets (7.5 mg total) by mouth daily. 02/28/18  Yes Karamalegos, Devonne Doughty, DO  amoxicillin (AMOXIL) 500 MG capsule Take 2 capsules (1,000 mg total) by mouth as needed. With any  Procedures. 03/04/18   Leonie Man, MD  Blood Glucose Monitoring Suppl (ONE TOUCH ULTRA 2) w/Device KIT See admin instructions. 12/05/17   [provider]  OLANZapine (ZYPREXA) 2.5 MG tablet Take 8 tablets (20 mg total) by mouth at bedtime. Patient not taking: Reported on 03/20/2018 11/23/17 11/23/18  Loletha Grayer, MD   Allergies  Allergen Reactions  . Other     No NSAIDS r/t blood thinners    FAMILY HISTORY:  family history is not on file. SOCIAL HISTORY:  reports that he has  quit smoking. His smoking use included cigarettes. He has a 40.00 pack-year smoking history. He has quit using smokeless tobacco. He reports that he does not drink alcohol or use drugs.  REVIEW OF SYSTEMS:   Unable to obtain due to critical illness   VITAL SIGNS: Temp:  [99.5 F (37.5 C)-100.9 F (38.3 C)] 99.5 F  (37.5 C) (10/26 1100) Pulse Rate:  [60-97] 81 (10/26 1100) Resp:  [8-28] 28 (10/26 1100) BP: (105-140)/(58-86) 131/75 (10/26 1000) SpO2:  [90 %-95 %] 93 % (10/26 1100) Arterial Line BP: (109-167)/(50-82) 167/82 (10/26 1100) FiO2 (%):  [95 %-100 %] 95 % (10/26 1100) Weight:  [113.2 kg] 113.2 kg (10/26 0406)  Physical Examination: Sedated and medically paralyzed On ventilator, no distress, bilateral equal air entry with no adventitious sounds S1 & S2 are audible with no murmur Benign abdominal exam with feeble peristalsis No leg edema   ASSESSMENT / PLAN: Acute respiratory failure with ARDS.PF ratio  63% PEEP of 10  and 95% with PIP < 30. -Low-dose steroid, Nimbex, low tidal volume lung protective ventilator settings with permissive hypercapnia. -Monitor ABG, optimize vent settings to keep PIP less than 30 as tolerated.  Pneumonia was influenza A.  Improved airspace disease of left lower lobe and right middle lobe,pulmonary congestion and bilateral pleural effusion. -Tamiflu +  Zosyn + s/p Zithromax. MRSA PCR negative respiratory culture negative. -Monitor CXR + CBC + FiO2 -Optimize diuresis to improve lung compliance, maintain CVP<8 as renal function tolerates.  Elevated troponin with history of nonischemic cardiomyopathy status post AICD and status post aortic valve replacement for bicuspid aortic valve.Echo 03/20/2018 LVEF 30 to 35% nonischemic cardiomyopathy. -Optimize heparin drip -Follow with cardiology evaluation  Sepsis with procalcitonin 1.88 down from 1.95.MRSA PCR negative, blood culture and respiratory culture remains negative -Zosyn + s/p Zithromax -Procalcitonin 1.95 trended down to 1.06  Prerenal azotemia with intravascular volume depletion and secondary to catabolic effect of steroids. -Optimize volume, avoid nephrotoxins, monitor renal panel and urine output  Hypertriglyceridemia (improed) Off propofol. On Heparin gtt. -Monitor  Elevated  LFTs. -Optimize meds (Hold Zoloft and Zyprexa) and consider d/c Tamiflu -Monitor LFTs and Lipase -Consider Liver U/S if no improvement   Anemia -Keep hemoglobin more than 7 g/dL  Coagulopathy secondary to warfarin. -Currently patient on heparin drip as management for hypertriglyceridemia and anticoagulation -Monitor coags  Full code  DVT &GI prophylaxis. Continue with supportive care  Patient fianc at the bedside was updated and she agreed to the plan of care  Critical care time 40 minutes

## 2018-03-23 NOTE — Progress Notes (Signed)
Nicholas Horton for heparin dosing  Indication: hypertriglyceridemia   Allergies  Allergen Reactions  . Other     No NSAIDS r/t blood thinners    Patient Measurements: Height: '6\' 2"'  (188 cm) Weight: 249 lb 9 oz (113.2 kg) IBW/kg (Calculated) : 82.2  Vital Signs: Temp: 101.1 F (38.4 C) (10/26 1800) Temp Source: Bladder (10/26 1800) BP: 161/90 (10/26 1800) Pulse Rate: 68 (10/26 1800)  Labs: Recent Labs    03/21/18 0510 03/22/18 0351  03/23/18 0415 03/23/18 1006 03/23/18 1809  HGB 12.0* 11.1*  --  10.9*  --   --   HCT 36.9* 35.5*  --  34.8*  --   --   PLT 169 197  --  244  --   --   LABPROT 35.9* 35.2*  --  21.8*  --   --   INR 3.67 3.58  --  1.93  --   --   HEPARINUNFRC  --   --    < > 0.15* 0.16* 0.34  CREATININE 0.88 0.72  --  0.72  --   --    < > = values in this interval not displayed.    Estimated Creatinine Clearance: 162.6 mL/min (by C-G formula based on SCr of 0.72 mg/dL).   Medical History: Past Medical History:  Diagnosis Date  . CHF (congestive heart failure) ([place])   . Congenital bicuspid aortic valve 09/2014   - s/p AVR  . Nonischemic cardiomyopathy ([place]) -- Resolved[I42.9] 09/2014   EF by Echo 20-25% (pre-op AVR) --> Echo 10/2014 & 03/2015: EF 50-55% - also Recent Normal Diastolic parameters   . S/P AVR (aortic valve replacement) 10/2014   21 mm Carbometrics Mechanical Valve -- Epicardial PPM Leads  . Severe aortic stenosis 10/04/2014   Presented with Syncope & CHF    Medications:  Scheduled:  . artificial tears  1 application Both Eyes U6J  . aspirin  81 mg Per Tube Daily  . buPROPion  200 mg Per Tube BID  . chlorhexidine gluconate (MEDLINE KIT)  15 mL Mouth Rinse BID  . cisatracurium  5 mg Intravenous Once  . famotidine  20 mg Per Tube BID  . feeding supplement (PRO-STAT SUGAR FREE 64)  60 mL Per Tube 5 X Daily  . feeding supplement (VITAL HIGH PROTEIN)  1,000 mL Per Tube Q24H  . furosemide  20 mg Intravenous  Daily  . insulin aspart  0-15 Units Subcutaneous Q4H  . ipratropium-albuterol  3 mL Nebulization Q6H  . mouth rinse  15 mL Mouth Rinse 10 times per day  . methylPREDNISolone (SOLU-MEDROL) injection  40 mg Intravenous BID  . multivitamin  15 mL Per Tube Daily  . oseltamivir  75 mg Per Tube BID  . polyethylene glycol  17 g Per Tube Daily  . senna-docusate  2 tablet Per Tube BID  . sodium bicarbonate  50 mEq Intravenous Once   Infusions:  . cisatracurium (NIMBEX) infusion 3 mcg/kg/min (03/23/18 1800)  . fentaNYL infusion INTRAVENOUS 250 mcg/hr (03/23/18 1802)  . heparin 1,650 Units/hr (03/23/18 1800)  . midazolam (VERSED) infusion 1.5 mg/hr (03/23/18 1800)  . piperacillin-tazobactam (ZOSYN)  IV 12.5 mL/hr at 03/23/18 1800  .  sodium bicarbonate (isotonic) infusion in sterile water      Assessment: Pharmacy consulted for heparin drip management for 42 yo male admitted with acute respiratory failure. Patient is currently on ARDS protocol requiring cisatritrocrium infusion. Patient previously on propofol infusion and transitioned to midazolam due to hypertriglyceridemia.  Patient on warfarin as an outpatient with stated goal of 2-3, however due to multiple risk factors (left ventricular dysfunction, CHF, pacemaker and AICD placement) patient's goal in house will be 2.5-3. INR was elevated at 3.58. Warfarin for Aortic valve replacement  Per discussion on ICU rounds, will initiate heparin 1000 units/hr with no bolus for hypertriglyceridemia and discontinue warfarin while patient is receiving heparin infusion. Goal heparin level is 0.3-0.5. Patient is to receive no boluses while INR is in therapeutic range.   Patient is getting triglyceride levels Q8hr and daily INRs.   Will coordinate with CCM when to discontinue heparin and resume warfarin.    Goal of Therapy:  INR 2.5-3 Heparin level 0.3-0.5 units/ml Monitor platelets by anticoagulation protocol: Yes   Plan:  10/26 @ 0400 HL 0.15  subtherapeutic. Will increase rate to 1350 units/hr and will recheck HL @ 1000, h/h trending down will continue to monitor. Last TG 374 continues to decline, will continue to monitor.  10/26 1006 HL= 0.16 . INR 1.93. TG 369. Will order bolus of 3000 units and increase drip to 1650 units/hr for goal HL 0.3-0.5. Will recheck in 6 hrs.  10/26 1800 heparin level 0.34.  Will maintain current rate and recheck in 6 hours.  Evelena Asa, PharmD Clinical Pharmacist 03/23/2018

## 2018-03-23 NOTE — Consult Note (Signed)
MEDICATION RELATED CONSULT NOTE   Pharmacy Consult for Electrolytes  Pharmacy consulted to assist in monitoring and replacing electrolytes in this 42 y.o. male admitted on 03/20/2018 with pneumonia. Patient also has influenza A+.   Assessment/Plan:   Goals: potassium ~ 4.0 and goal magnesium ~ 2.0 K 4.4  Mag 2.6  Phos 2.6 Scr 0.72 Patient ordered  furosemide 20 mg IV BID x 2 days starting this evening and 40 mg IV x 1 today .  Patient's electrolytes are WNL. No supplementation is required at this time.   Will recheck electrolytes with am labs.   Pharmacy will continue to follow and adjust the electrolytes as needed.  Siddiq Kaluzny A, 03/23/2018,9:13 AM

## 2018-03-24 ENCOUNTER — Inpatient Hospital Stay: Payer: Medicare HMO

## 2018-03-24 DIAGNOSIS — J9601 Acute respiratory failure with hypoxia: Secondary | ICD-10-CM | POA: Diagnosis not present

## 2018-03-24 DIAGNOSIS — J811 Chronic pulmonary edema: Secondary | ICD-10-CM | POA: Diagnosis not present

## 2018-03-24 DIAGNOSIS — R918 Other nonspecific abnormal finding of lung field: Secondary | ICD-10-CM | POA: Diagnosis not present

## 2018-03-24 DIAGNOSIS — I110 Hypertensive heart disease with heart failure: Secondary | ICD-10-CM | POA: Diagnosis not present

## 2018-03-24 DIAGNOSIS — E785 Hyperlipidemia, unspecified: Secondary | ICD-10-CM | POA: Diagnosis not present

## 2018-03-24 DIAGNOSIS — D72829 Elevated white blood cell count, unspecified: Secondary | ICD-10-CM | POA: Diagnosis not present

## 2018-03-24 DIAGNOSIS — R0603 Acute respiratory distress: Secondary | ICD-10-CM | POA: Diagnosis not present

## 2018-03-24 DIAGNOSIS — I517 Cardiomegaly: Secondary | ICD-10-CM | POA: Diagnosis not present

## 2018-03-24 DIAGNOSIS — E870 Hyperosmolality and hypernatremia: Secondary | ICD-10-CM | POA: Diagnosis not present

## 2018-03-24 DIAGNOSIS — J09X2 Influenza due to identified novel influenza A virus with other respiratory manifestations: Secondary | ICD-10-CM | POA: Diagnosis not present

## 2018-03-24 DIAGNOSIS — R7989 Other specified abnormal findings of blood chemistry: Secondary | ICD-10-CM | POA: Diagnosis not present

## 2018-03-24 DIAGNOSIS — Z4659 Encounter for fitting and adjustment of other gastrointestinal appliance and device: Secondary | ICD-10-CM | POA: Diagnosis not present

## 2018-03-24 DIAGNOSIS — J9621 Acute and chronic respiratory failure with hypoxia: Secondary | ICD-10-CM | POA: Diagnosis not present

## 2018-03-24 DIAGNOSIS — G4733 Obstructive sleep apnea (adult) (pediatric): Secondary | ICD-10-CM | POA: Diagnosis not present

## 2018-03-24 DIAGNOSIS — Z87891 Personal history of nicotine dependence: Secondary | ICD-10-CM | POA: Diagnosis not present

## 2018-03-24 DIAGNOSIS — R69 Illness, unspecified: Secondary | ICD-10-CM | POA: Diagnosis not present

## 2018-03-24 DIAGNOSIS — J80 Acute respiratory distress syndrome: Secondary | ICD-10-CM | POA: Diagnosis not present

## 2018-03-24 DIAGNOSIS — J440 Chronic obstructive pulmonary disease with acute lower respiratory infection: Secondary | ICD-10-CM | POA: Diagnosis not present

## 2018-03-24 DIAGNOSIS — Z952 Presence of prosthetic heart valve: Secondary | ICD-10-CM | POA: Diagnosis not present

## 2018-03-24 DIAGNOSIS — Z950 Presence of cardiac pacemaker: Secondary | ICD-10-CM | POA: Diagnosis not present

## 2018-03-24 DIAGNOSIS — Z9281 Personal history of extracorporeal membrane oxygenation (ECMO): Secondary | ICD-10-CM | POA: Diagnosis not present

## 2018-03-24 DIAGNOSIS — K760 Fatty (change of) liver, not elsewhere classified: Secondary | ICD-10-CM | POA: Diagnosis not present

## 2018-03-24 DIAGNOSIS — K6389 Other specified diseases of intestine: Secondary | ICD-10-CM | POA: Diagnosis not present

## 2018-03-24 DIAGNOSIS — J1008 Influenza due to other identified influenza virus with other specified pneumonia: Secondary | ICD-10-CM | POA: Diagnosis not present

## 2018-03-24 DIAGNOSIS — J101 Influenza due to other identified influenza virus with other respiratory manifestations: Secondary | ICD-10-CM | POA: Diagnosis not present

## 2018-03-24 DIAGNOSIS — Z7401 Bed confinement status: Secondary | ICD-10-CM | POA: Diagnosis not present

## 2018-03-24 DIAGNOSIS — J90 Pleural effusion, not elsewhere classified: Secondary | ICD-10-CM | POA: Diagnosis not present

## 2018-03-24 DIAGNOSIS — K219 Gastro-esophageal reflux disease without esophagitis: Secondary | ICD-10-CM | POA: Diagnosis not present

## 2018-03-24 DIAGNOSIS — I428 Other cardiomyopathies: Secondary | ICD-10-CM | POA: Diagnosis not present

## 2018-03-24 DIAGNOSIS — I5022 Chronic systolic (congestive) heart failure: Secondary | ICD-10-CM | POA: Diagnosis not present

## 2018-03-24 DIAGNOSIS — J189 Pneumonia, unspecified organism: Secondary | ICD-10-CM | POA: Diagnosis not present

## 2018-03-24 DIAGNOSIS — J9690 Respiratory failure, unspecified, unspecified whether with hypoxia or hypercapnia: Secondary | ICD-10-CM | POA: Diagnosis not present

## 2018-03-24 DIAGNOSIS — R1312 Dysphagia, oropharyngeal phase: Secondary | ICD-10-CM | POA: Diagnosis not present

## 2018-03-24 DIAGNOSIS — K828 Other specified diseases of gallbladder: Secondary | ICD-10-CM | POA: Diagnosis not present

## 2018-03-24 DIAGNOSIS — J9811 Atelectasis: Secondary | ICD-10-CM | POA: Diagnosis not present

## 2018-03-24 DIAGNOSIS — Z4682 Encounter for fitting and adjustment of non-vascular catheter: Secondary | ICD-10-CM | POA: Diagnosis not present

## 2018-03-24 DIAGNOSIS — E873 Alkalosis: Secondary | ICD-10-CM | POA: Diagnosis not present

## 2018-03-24 DIAGNOSIS — Z9911 Dependence on respirator [ventilator] status: Secondary | ICD-10-CM | POA: Diagnosis not present

## 2018-03-24 DIAGNOSIS — I255 Ischemic cardiomyopathy: Secondary | ICD-10-CM | POA: Diagnosis not present

## 2018-03-24 DIAGNOSIS — I5020 Unspecified systolic (congestive) heart failure: Secondary | ICD-10-CM | POA: Diagnosis not present

## 2018-03-24 DIAGNOSIS — J111 Influenza due to unidentified influenza virus with other respiratory manifestations: Secondary | ICD-10-CM | POA: Diagnosis not present

## 2018-03-24 LAB — COMPREHENSIVE METABOLIC PANEL
ALK PHOS: 42 U/L (ref 38–126)
ALT: 389 U/L — ABNORMAL HIGH (ref 0–44)
AST: 188 U/L — ABNORMAL HIGH (ref 15–41)
Albumin: 2.9 g/dL — ABNORMAL LOW (ref 3.5–5.0)
Anion gap: 10 (ref 5–15)
BILIRUBIN TOTAL: 1 mg/dL (ref 0.3–1.2)
BUN: 34 mg/dL — ABNORMAL HIGH (ref 6–20)
CALCIUM: 8.4 mg/dL — AB (ref 8.9–10.3)
CO2: 36 mmol/L — AB (ref 22–32)
CREATININE: 0.56 mg/dL — AB (ref 0.61–1.24)
Chloride: 103 mmol/L (ref 98–111)
GFR calc non Af Amer: >60 mL/min (ref >60)
Glucose, Bld: 166 mg/dL — ABNORMAL HIGH (ref 70–99)
Potassium: 4.2 mmol/L (ref 3.5–5.1)
SODIUM: 149 mmol/L — AB (ref 135–145)
Total Protein: 6.8 g/dL (ref 6.5–8.1)

## 2018-03-24 LAB — BPAM RBC
BLOOD PRODUCT EXPIRATION DATE: 201911182359
BLOOD PRODUCT EXPIRATION DATE: 201911182359
Blood Product Expiration Date: 201911182359
Blood Product Expiration Date: 201911182359
ISSUE DATE / TIME: 201910271631
ISSUE DATE / TIME: 201910271631
ISSUE DATE / TIME: 201910271631
ISSUE DATE / TIME: 201910271631
UNIT TYPE AND RH: 600
UNIT TYPE AND RH: 600
Unit Type and Rh: 600
Unit Type and Rh: 600

## 2018-03-24 LAB — BLOOD GAS, ARTERIAL
ACID-BASE EXCESS: 11.9 mmol/L — AB (ref 0.0–2.0)
Acid-Base Excess: 15.9 mmol/L — ABNORMAL HIGH (ref 0.0–2.0)
Bicarbonate: 39.3 mmol/L — ABNORMAL HIGH (ref 20.0–28.0)
Bicarbonate: 42.7 mmol/L — ABNORMAL HIGH (ref 20.0–28.0)
FIO2: 95
FIO2: 0.95
MECHVT: 420 mL
MECHVT: 420 mL
Mechanical Rate: 30
O2 SAT: 90.3 %
O2 Saturation: 93.5 %
PATIENT TEMPERATURE: 37
PCO2 ART: 65 mmHg — AB (ref 32.0–48.0)
PEEP: 10 cmH2O
PH ART: 7.39 (ref 7.350–7.450)
PO2 ART: 60 mmHg — AB (ref 83.0–108.0)
Patient temperature: 37
RATE: 30 resp/min
RATE: 30 {breaths}/min
pCO2 arterial: 60 mmHg — ABNORMAL HIGH (ref 32.0–48.0)
pH, Arterial: 7.46 — ABNORMAL HIGH (ref 7.350–7.450)
pO2, Arterial: 65 mmHg — ABNORMAL LOW (ref 83.0–108.0)

## 2018-03-24 LAB — MAGNESIUM
MAGNESIUM: 2.7 mg/dL — AB (ref 1.7–2.4)
Magnesium: 2.5 mg/dL — ABNORMAL HIGH (ref 1.7–2.4)

## 2018-03-24 LAB — BASIC METABOLIC PANEL
Anion gap: 8 (ref 5–15)
BUN: 37 mg/dL — ABNORMAL HIGH (ref 6–20)
CALCIUM: 8.4 mg/dL — AB (ref 8.9–10.3)
CO2: 40 mmol/L — AB (ref 22–32)
CREATININE: 0.74 mg/dL (ref 0.61–1.24)
Chloride: 101 mmol/L (ref 98–111)
GFR calc Af Amer: >60 mL/min (ref >60)
GFR calc non Af Amer: >60 mL/min (ref >60)
Glucose, Bld: 248 mg/dL — ABNORMAL HIGH (ref 70–99)
POTASSIUM: 4.8 mmol/L (ref 3.5–5.1)
Sodium: 149 mmol/L — ABNORMAL HIGH (ref 135–145)

## 2018-03-24 LAB — TYPE AND SCREEN
ABO/RH(D): A NEG
Antibody Screen: NEGATIVE
UNIT DIVISION: 0
UNIT DIVISION: 0
Unit division: 0
Unit division: 0

## 2018-03-24 LAB — HEPATIC FUNCTION PANEL
ALK PHOS: 48 U/L (ref 38–126)
ALT: 564 U/L — AB (ref 0–44)
AST: 234 U/L — ABNORMAL HIGH (ref 15–41)
Albumin: 3.2 g/dL — ABNORMAL LOW (ref 3.5–5.0)
BILIRUBIN DIRECT: 0.7 mg/dL — AB (ref 0.0–0.2)
BILIRUBIN INDIRECT: 0.8 mg/dL (ref 0.3–0.9)
Total Bilirubin: 1.5 mg/dL — ABNORMAL HIGH (ref 0.3–1.2)
Total Protein: 7.2 g/dL (ref 6.5–8.1)

## 2018-03-24 LAB — PROTIME-INR
INR: 1.79
Prothrombin Time: 20.6 seconds — ABNORMAL HIGH (ref 11.4–15.2)

## 2018-03-24 LAB — ABO/RH: ABO/RH(D): A NEG

## 2018-03-24 LAB — CBC
HCT: 36.4 % — ABNORMAL LOW (ref 39.0–52.0)
Hemoglobin: 11.3 g/dL — ABNORMAL LOW (ref 13.0–17.0)
MCH: 29.9 pg (ref 26.0–34.0)
MCHC: 31 g/dL (ref 30.0–36.0)
MCV: 96.3 fL (ref 80.0–100.0)
Platelets: 273 10*3/uL (ref 150–400)
RBC: 3.78 MIL/uL — AB (ref 4.22–5.81)
RDW: 14.1 % (ref 11.5–15.5)
WBC: 12.3 10*3/uL — ABNORMAL HIGH (ref 4.0–10.5)
nRBC: 0.3 % — ABNORMAL HIGH (ref 0.0–0.2)

## 2018-03-24 LAB — GLUCOSE, CAPILLARY
Glucose-Capillary: 155 mg/dL — ABNORMAL HIGH (ref 70–99)
Glucose-Capillary: 110 mg/dL — ABNORMAL HIGH (ref 70–99)
Glucose-Capillary: 155 mg/dL — ABNORMAL HIGH (ref 70–99)
Glucose-Capillary: 249 mg/dL — ABNORMAL HIGH (ref 70–99)
Glucose-Capillary: 216 mg/dL — ABNORMAL HIGH (ref 70–99)

## 2018-03-24 LAB — PROCALCITONIN: Procalcitonin: 0.39 ng/mL

## 2018-03-24 LAB — LACTIC ACID, PLASMA
Lactic Acid, Venous: 1.1 mmol/L (ref 0.5–1.9)
Lactic Acid, Venous: 1 mmol/L (ref 0.5–1.9)

## 2018-03-24 LAB — PHOSPHORUS
PHOSPHORUS: 6.2 mg/dL — AB (ref 2.5–4.6)
Phosphorus: 3.5 mg/dL (ref 2.5–4.6)

## 2018-03-24 LAB — HEPARIN LEVEL (UNFRACTIONATED)
Heparin Unfractionated: 0.28 IU/mL — ABNORMAL LOW (ref 0.30–0.70)
Heparin Unfractionated: 0.3 [IU]/mL (ref 0.30–0.70)

## 2018-03-24 LAB — LIPASE, BLOOD: LIPASE: 78 U/L — AB (ref 11–51)

## 2018-03-24 LAB — TRIGLYCERIDES: TRIGLYCERIDES: 373 mg/dL — AB (ref <150)

## 2018-03-24 MED ORDER — LABETALOL HCL 5 MG/ML IV SOLN
10.0000 mg | Freq: Once | INTRAVENOUS | Status: AC
  Administered 2018-03-24: 10 mg via INTRAVENOUS

## 2018-03-24 MED ORDER — VANCOMYCIN HCL 10 G IV SOLR
1500.0000 mg | Freq: Once | INTRAVENOUS | Status: AC
  Administered 2018-03-24: 1500 mg via INTRAVENOUS
  Filled 2018-03-24: qty 1500

## 2018-03-24 MED ORDER — DEXTROSE 10 % IV SOLN
INTRAVENOUS | Status: DC
  Administered 2018-03-24: 14:00:00 via INTRAVENOUS

## 2018-03-24 MED ORDER — ARTIFICIAL TEARS OPHTHALMIC OINT: 1.0000 "application " | TOPICAL_OINTMENT | Freq: Three times a day (TID) | OPHTHALMIC | 0 refills | Status: AC

## 2018-03-24 MED ORDER — METHYLPREDNISOLONE SODIUM SUCC 40 MG IJ SOLR: 40.0000 mg | Freq: Two times a day (BID) | INTRAMUSCULAR | 0 refills | Status: DC

## 2018-03-24 MED ORDER — FUROSEMIDE 10 MG/ML IJ SOLN: 20.0000 mg | Freq: Every day | INTRAMUSCULAR | 0 refills | Status: DC

## 2018-03-24 MED ORDER — HEPARIN (PORCINE) IN NACL 100-0.45 UNIT/ML-% IJ SOLN: 1800.0000 [IU]/h | INTRAMUSCULAR | 0 refills | Status: DC

## 2018-03-24 MED ORDER — INSULIN REGULAR(HUMAN) IN NACL 100-0.9 UT/100ML-% IV SOLN
INTRAVENOUS | Status: DC
  Administered 2018-03-24: 2 [IU]/h via INTRAVENOUS
  Filled 2018-03-24: qty 100

## 2018-03-24 MED ORDER — STERILE WATER FOR INJECTION IV SOLN: 25.0000 mL/h | INTRAVENOUS | 0 refills | Status: DC

## 2018-03-24 MED ORDER — ACETAMINOPHEN 160 MG/5ML PO SOLN
500.0000 mg | Freq: Three times a day (TID) | ORAL | Status: DC | PRN
  Administered 2018-03-24: 500 mg
  Filled 2018-03-24 (×2): qty 20.3

## 2018-03-24 MED ORDER — SODIUM CHLORIDE 0.9 % IV SOLN: 3.0000 ug/kg/min | INTRAVENOUS | 0 refills | Status: DC

## 2018-03-24 MED ORDER — PANTOPRAZOLE SODIUM 40 MG PO PACK: 40.0000 mg | PACK | Freq: Every day | ORAL | 0 refills | Status: AC

## 2018-03-24 MED ORDER — PANTOPRAZOLE SODIUM 40 MG PO PACK
40.0000 mg | PACK | Freq: Every day | ORAL | Status: DC
  Administered 2018-03-24: 40 mg
  Filled 2018-03-24 (×2): qty 20

## 2018-03-24 MED ORDER — SENNOSIDES-DOCUSATE SODIUM 8.6-50 MG PO TABS: 2.0000 | ORAL_TABLET | Freq: Two times a day (BID) | ORAL | 0 refills | Status: AC

## 2018-03-24 MED ORDER — SODIUM CHLORIDE 0.9 % IV SOLN: 0.0000 mg/h | INTRAVENOUS | 0 refills | Status: DC

## 2018-03-24 MED ORDER — SODIUM CHLORIDE 0.9% IV SOLUTION: Freq: Once | INTRAVENOUS | Status: DC

## 2018-03-24 MED ORDER — ASPIRIN 81 MG PO CHEW: 81.0000 mg | CHEWABLE_TABLET | Freq: Every day | ORAL | 0 refills | Status: AC

## 2018-03-24 MED ORDER — LABETALOL HCL 5 MG/ML IV SOLN
INTRAVENOUS | Status: AC
  Filled 2018-03-24: qty 4

## 2018-03-24 MED ORDER — IPRATROPIUM-ALBUTEROL 0.5-2.5 (3) MG/3ML IN SOLN: 3.0000 mL | Freq: Four times a day (QID) | RESPIRATORY_TRACT | 0 refills | Status: DC

## 2018-03-24 MED ORDER — FENTANYL 2500MCG IN NS 250ML (10MCG/ML) PREMIX INFUSION: 100.0000 ug/h | INTRAVENOUS | 0 refills | Status: DC

## 2018-03-24 MED ORDER — DEXTROSE 10 % IV SOLN: 20.0000 mL/h | INTRAVENOUS | 0 refills | Status: DC

## 2018-03-24 MED ORDER — PIPERACILLIN-TAZOBACTAM 3.375 G IVPB: 3.3750 g | Freq: Three times a day (TID) | INTRAVENOUS | 0 refills | Status: DC

## 2018-03-24 MED ORDER — POLYETHYLENE GLYCOL 3350 17 G PO PACK: 17.0000 g | PACK | Freq: Every day | ORAL | 0 refills | Status: AC

## 2018-03-24 MED ORDER — INSULIN REGULAR(HUMAN) IN NACL 100-0.9 UT/100ML-% IV SOLN: 2.0000 [IU]/h | INTRAVENOUS | 0 refills | Status: DC

## 2018-03-24 NOTE — Progress Notes (Signed)
Patients significant other at bedside saying her good bye. Patient being transported by [facility].

## 2018-03-24 NOTE — Progress Notes (Signed)
[place] for heparin dosing  Indication: hypertriglyceridemia   Allergies  Allergen Reactions  . Other     No NSAIDS r/t blood thinners    Patient Measurements: Height: _0  (188 cm) Weight: 249 lb 9 oz (113.2 kg) IBW/kg (Calculated) : 82.2  Vital Signs: Temp: 98.8 F (37.1 C) (10/27 0000) Temp Source: Bladder (10/27 0000) BP: 145/79 (10/27 0000) Pulse Rate: 60 (10/27 0000)  Labs: Recent Labs    03/21/18 0510 03/22/18 0351  03/23/18 0415 03/23/18 1006 03/23/18 1809 03/23/18 2016 03/24/18 0015  HGB 12.0* 11.1*  --  10.9*  --   --   --   --   HCT 36.9* 35.5*  --  34.8*  --   --   --   --   PLT 169 197  --  244  --   --   --   --   LABPROT 35.9* 35.2*  --  21.8*  --   --   --   --   INR 3.67 3.58  --  1.93  --   --   --   --   HEPARINUNFRC  --   --    < > 0.15* 0.16* 0.34  --  0.28*  CREATININE 0.88 0.72  --  0.72  --   --  0.76  --    < > = values in this interval not displayed.    Estimated Creatinine Clearance: 162.6 mL/min (by C-G formula based on SCr of 0.76 mg/dL).   Medical History: Past Medical History:  Diagnosis Date  . CHF (congestive heart failure) ([place])   . Congenital bicuspid aortic valve 09/2014   - s/p AVR  . Nonischemic cardiomyopathy ([place]) -- Resolved[I42.9] 09/2014   EF by Echo 20-25% (pre-op AVR) --> Echo 10/2014 & 03/2015: EF 50-55% - also Recent Normal Diastolic parameters   . S/P AVR (aortic valve replacement) 10/2014   21 mm Carbometrics Mechanical Valve -- Epicardial PPM Leads  . Severe aortic stenosis 10/04/2014   Presented with Syncope & CHF    Medications:  Scheduled:  . artificial tears  1 application Both Eyes D3O  . aspirin  81 mg Per Tube Daily  . buPROPion  200 mg Per Tube BID  . chlorhexidine gluconate (MEDLINE KIT)  15 mL Mouth Rinse BID  . cisatracurium  5 mg Intravenous Once  . famotidine  20 mg Per Tube BID  . feeding supplement (PRO-STAT SUGAR FREE 64)  60 mL Per Tube 5 X Daily   . feeding supplement (VITAL HIGH PROTEIN)  1,000 mL Per Tube Q24H  . furosemide  20 mg Intravenous Daily  . insulin aspart  0-15 Units Subcutaneous Q4H  . ipratropium-albuterol  3 mL Nebulization Q6H  . mouth rinse  15 mL Mouth Rinse 10 times per day  . methylPREDNISolone (SOLU-MEDROL) injection  40 mg Intravenous BID  . multivitamin  15 mL Per Tube Daily  . oseltamivir  75 mg Per Tube BID  . polyethylene glycol  17 g Per Tube Daily  . senna-docusate  2 tablet Per Tube BID   Infusions:  . cisatracurium (NIMBEX) infusion 3.5 mcg/kg/min (03/23/18 2300)  . fentaNYL infusion INTRAVENOUS 250 mcg/hr (03/23/18 2300)  . heparin 1,650 Units/hr (03/23/18 2300)  . midazolam (VERSED) infusion 2 mg/hr (03/23/18 2300)  . piperacillin-tazobactam (ZOSYN)  IV 12.5 mL/hr at 03/23/18 2300  .  sodium bicarbonate (isotonic) infusion in sterile water 50 mL/hr at 03/23/18 2300    Assessment:  Pharmacy consulted for heparin drip management for 42 yo male admitted with acute respiratory failure. Patient is currently on ARDS protocol requiring cisatritrocrium infusion. Patient previously on propofol infusion and transitioned to midazolam due to hypertriglyceridemia. Patient on warfarin as an outpatient with stated goal of 2-3, however due to multiple risk factors (left ventricular dysfunction, CHF, pacemaker and AICD placement) patient's goal in house will be 2.5-3. INR was elevated at 3.58. Warfarin for Aortic valve replacement  Per discussion on ICU rounds, will initiate heparin 1000 units/hr with no bolus for hypertriglyceridemia and discontinue warfarin while patient is receiving heparin infusion. Goal heparin level is 0.3-0.5. Patient is to receive no boluses while INR is in therapeutic range.   Patient is getting triglyceride levels Q8hr and daily INRs.   Will coordinate with CCM when to discontinue heparin and resume warfarin.    Goal of Therapy:  INR 2.5-3 Heparin level 0.3-0.5 units/ml Monitor  platelets by anticoagulation protocol: Yes   Plan:  10/27 @ 0000 HL 0.28 subtherapeutic. Will increase rate to 1750 units/hr and will recheck HL @ 0700. CBC check w/ am labs.  Tobie Lords, PharmD, BCPS Clinical Pharmacist 03/24/2018

## 2018-03-24 NOTE — Progress Notes (Signed)
[facility] life [facility] arrived, placing patients drips onto their pump for transportation.

## 2018-03-24 NOTE — Progress Notes (Signed)
Blood picked up from blood bank (cooler) and given to Elinor Dodge RN from [place].

## 2018-03-24 NOTE — Progress Notes (Signed)
Talked with pharmacy, and dextrose 10 is compatible with bicarb. Will run d10 with bicarb in one line.

## 2018-03-24 NOTE — Progress Notes (Signed)
Attempted to transport for ct scan.Began desatting into the 70s.Returned to CCU.Upon return to CCU sats dropped further,began bagging with a peep valve to increase sats. Per Dr Horton Finer attempted recruitment  by increasing Peep to 22 then back to 15.                     Sats now mid 39s with 100% and 16 peep.For transfer to [facility]

## 2018-03-24 NOTE — Progress Notes (Signed)
Surgeon completed ECMO placement. Patient being transferred to [facility]. Dr Duanne Limerick aware. ABG ordered before transfer.

## 2018-03-24 NOTE — Discharge Summary (Signed)
Name: Nicholas Horton MRN: [id number] DOB: Jun 22, 1975     CONSULTATION DATE: 03/20/2018  HISTORY OF PRESENT ILLNESS:   42 years old gentleman with history of congenital bicuspid aortic valve status post aortic valve replacement, nonischemic cardiomyopathy.  Patient is admitted with influenza pneumonia and acute respiratory failure.  He developed ARDS with worsening PF ratio and increased requirement for FiO2 titrated up to 100% and PEEP of 16.  During the course of admission he developed hypertriglyceridemia on propofol which had been discontinued and he also started on a heparin drip for therapeutic anticoagulation status post aortic valve replacement and to help lower your triglyceride level.  Cultures remain negative and he was kept on Zosyn for empiric antimicrobial coverage. Concern for developing pancreatitis with mild elevation of lipase and transaminases with leukocytosis and fever up to one 1.5.  Medications were optimized, started on insulin to low triglyceride level and Protonix.  We were unable to obtain CT abdomen and pelvis because of desaturation requiring the patient to have a recruitment maneuver twice. Empiric vancomycin therapy was restarted and repeat cultures and procalcitonin level were ordered to rule out sepsis.  Case was discussed with Dr. Berlin Hun who kindly accepted the patient for ECMO and agreed with current management and transfer to [facility] for higher level of care. Nicholas Horton father and fianc were notified with the patient updates and he agreed to plan of management.    PAST MEDICAL HISTORY :   has a past medical history of CHF (congestive heart failure) ([place]), Congenital bicuspid aortic valve (09/2014), Nonischemic cardiomyopathy ([place]) -- Resolved[I42.9] (09/2014), S/P AVR (aortic valve replacement) (10/2014), and Severe aortic stenosis (10/04/2014).  has a past surgical history that includes Surgery scrotal / testicular; Cardiac catheterization (N/A, 10/04/2014); Cardiac  catheterization (10/04/2014); Cardiac catheterization (N/A, 11/25/2014); Multiple extractions with alveoloplasty (N/A, 11/25/2014); Aortic valve replacement (N/A, 12/01/2014); TEE without cardioversion (N/A, 12/01/2014); transthoracic echocardiogram (03/2015); and transthoracic echocardiogram (08/2017). Prior to Admission medications   Medication Sig Start Date End Date Taking? Authorizing Provider  albuterol (PROVENTIL HFA;VENTOLIN HFA) 108 (90 Base) MCG/ACT inhaler Inhale 2 puffs into the lungs every 6 (six) hours as needed for wheezing or shortness of breath. 11/23/17  Yes Loletha Grayer, MD  aspirin EC 81 MG EC tablet Take 1 tablet (81 mg total) by mouth daily. 10/07/14  Yes Emokpae, Ejiroghene E, MD  buPROPion (WELLBUTRIN SR) 200 MG 12 hr tablet Take 1 tablet (200 mg total) by mouth 2 (two) times daily. 12/24/17  Yes Karamalegos, Devonne Doughty, DO  dicyclomine (BENTYL) 10 MG capsule Take 10 mg by mouth 4 (four) times daily as needed. 11/27/17  Yes [provider]  diltiazem (CARDIZEM CD) 120 MG 24 hr capsule Take 1 capsule (120 mg total) by mouth daily. 03/04/18 06/02/18 Yes Leonie Man, MD  furosemide (LASIX) 40 MG tablet Take one tablet by mouth daily except mondays and thursdays take two tablet  and may take an extra one dose additional if needed Patient taking differently: Take 40 mg by mouth as directed. Take one tablet by mouth daily except mondays and thursdays take two tablet  and may take an extra one dose additional if needed 03/04/18  Yes Leonie Man, MD  Glycopyrrolate-Formoterol (BEVESPI AEROSPHERE IN) Inhale 2 puffs into the lungs 2 (two) times daily.   Yes [provider]  losartan (COZAAR) 25 MG tablet Take 1 tablet (25 mg total) by mouth daily. 12/18/17  Yes Karamalegos, Devonne Doughty, DO  metFORMIN (GLUCOPHAGE) 1000 MG tablet Take 1  tablet (1,000 mg total) by mouth 2 (two) times daily. 12/18/17  Yes Karamalegos, Alexander J, DO  OLANZapine (ZYPREXA) 20 MG tablet Take 20 mg  by mouth daily. 02/20/18  Yes [provider]  oseltamivir (TAMIFLU) 75 MG capsule Take 1 capsule (75 mg total) by mouth 2 (two) times daily. For 5 days 03/18/18  Yes Karamalegos, Devonne Doughty, DO  sertraline (ZOLOFT) 50 MG tablet Take 50 mg by mouth every morning. 12/13/17  Yes [provider]  warfarin (COUMADIN) 5 MG tablet Take 1.5 tablets (7.5 mg total) by mouth daily. 02/28/18  Yes Karamalegos, Devonne Doughty, DO  amoxicillin (AMOXIL) 500 MG capsule Take 2 capsules (1,000 mg total) by mouth as needed. With any  Procedures. 03/04/18   Leonie Man, MD  Blood Glucose Monitoring Suppl (ONE TOUCH ULTRA 2) w/Device KIT See admin instructions. 12/05/17   [provider]  OLANZapine (ZYPREXA) 2.5 MG tablet Take 8 tablets (20 mg total) by mouth at bedtime. Patient not taking: Reported on 03/20/2018 11/23/17 11/23/18  Loletha Grayer, MD   Allergies  Allergen Reactions  . Other     No NSAIDS r/t blood thinners    FAMILY HISTORY:  family history is not on file. SOCIAL HISTORY:  reports that he has quit smoking. His smoking use included cigarettes. He has a 40.00 pack-year smoking history. He has quit using smokeless tobacco. He reports that he does not drink alcohol or use drugs.  REVIEW OF SYSTEMS:   Unable to obtain due to critical illness   VITAL SIGNS: Temp:  [98.4 F (36.9 C)-102.1 F (38.9 C)] 99.3 F (37.4 C) (10/27 0800) Pulse Rate:  [58-103] 66 (10/27 0800) Resp:  [19-30] 30 (10/27 0800) BP: (112-165)/(65-90) 112/65 (10/27 0800) SpO2:  [90 %-96 %] 93 % (10/27 0800) Arterial Line BP: (124-233)/(58-102) 124/58 (10/27 0800) FiO2 (%):  [93 %-95 %] 95 % (10/27 0800) Weight:  [111.6 kg] 111.6 kg (10/27 0500)  Physical Examination: Sedated and medically paralyzed On ventilator, no distress, bilateral equal air entry with no adventitious sounds S1 & S2 are audible with no murmur Benign abdominal exam with feeble peristalsis No leg edema  Course of  admission: Acute respiratory failure with ARDS.PF ratio69% PEEP of 10and 95%withPIP < 23 -Low-dose steroid, Nimbex, low tidal volume lung protective ventilator settings with permissive hypercapnia. -Monitor ABG, optimize vent settings to keep PIP less than 30 as tolerated. -Case was discussed with [facility] (Dr. Abigail Miyamoto) for further management of ARDS ([place]) and they declined transfer and [facility] for possible [facility]. -Case was discussed with [facility] (Dr. Berlin Hun) he is accepted for transfer -Mr. Mckendree's father was notified, updated and agreed to the plan of care.  Pneumonia was influenza A.Improved airspace disease of left lower lobe and right middle lobe,pulmonary congestion and bilateral pleural effusion. -c/wZosyn + s/pZithromax + tamiflu. MRSA PCR negative respiratory culture negative. -Monitor CXR + CBC + FiO2 -Optimize diuresis to improve lung compliance, maintain CVP<8 as renal function tolerates.  Fever T max 101.5, cultures on 10/23 -ve, MRSA PCR -ve Procal 1.06, WBC 12.3 -Empiric Zosyn, follow with repeat cultures, Procal. And consider empiric Vac and ID consult if no improvement.  Elevated troponin with history of nonischemic cardiomyopathy status post AICD and status post aortic valve replacement for bicuspid aortic valve.Echo 03/20/2018 LVEF 30 to 35% nonischemic cardiomyopathy. -Optimizeheparin drip -Follow with cardiology evaluation  Sepsis with procalcitonin 1.88 down from 1.95.MRSA PCR negative, blood culture and respiratory culture remains negative -Zosyn +s/pZithromax -Procalcitonin1.95 trended down to 1.06  Prerenal azotemia with intravascular volume depletionand secondary to catabolic effect of steroids. -Optimize volume, avoid nephrotoxins, monitor renal panel and urine output  Combined metabolic and respiratory alkalosis -Optimize vent settings, Bicarb. Gtt, monitor ABG and renal panel.  Possible pancreatitis with  elevated LFTs (worsening), elevated serum Lipase, hypertriglyceridemia, fever 101.5, leukocytosis. On Heparin gtt. -Optimize meds (Hold Zoloft, Zyprexa and Tamiflu) -Start on Insulin gtt. -Monitor LFTs, Lipase and TG -Unable to obtain CT abd & Pel because of desaturation.   Anemia -Keep hemoglobin more than 7 g/dL  Coagulopathy secondary to warfarin. -Currently patient on heparin drip as management for hypertriglyceridemia and anticoagulation -Monitor coags  Full code  DVT &GI prophylaxis. Continue with supportive care

## 2018-03-24 NOTE — Progress Notes (Signed)
[place] for heparin dosing  Indication: hypertriglyceridemia   Allergies  Allergen Reactions  . Other     No NSAIDS r/t blood thinners    Patient Measurements: Height: '6\' 2"'  (188 cm) Weight: 246 lb 0.5 oz (111.6 kg) IBW/kg (Calculated) : 82.2  Vital Signs: Temp: 99 F (37.2 C) (10/27 0600) Temp Source: Bladder (10/27 0600) BP: 118/68 (10/27 0600) Pulse Rate: 62 (10/27 0600)  Labs: Recent Labs    03/22/18 0351  03/23/18 0415  03/23/18 1809 03/23/18 2016 03/24/18 0015 03/24/18 0420 03/24/18 0609  HGB 11.1*  --  10.9*  --   --   --   --  11.3*  --   HCT 35.5*  --  34.8*  --   --   --   --  36.4*  --   PLT 197  --  244  --   --   --   --  273  --   LABPROT 35.2*  --  21.8*  --   --   --   --  20.6*  --   INR 3.58  --  1.93  --   --   --   --  1.79  --   HEPARINUNFRC  --    < > 0.15*   < > 0.34  --  0.28*  --  0.30  CREATININE 0.72  --  0.72  --   --  0.76  --  0.56*  --    < > = values in this interval not displayed.    Estimated Creatinine Clearance: 161.6 mL/min (A) (by C-G formula based on SCr of 0.56 mg/dL (L)).   Medical History: Past Medical History:  Diagnosis Date  . CHF (congestive heart failure) ([place])   . Congenital bicuspid aortic valve 09/2014   - s/p AVR  . Nonischemic cardiomyopathy ([place]) -- Resolved[I42.9] 09/2014   EF by Echo 20-25% (pre-op AVR) --> Echo 10/2014 & 03/2015: EF 50-55% - also Recent Normal Diastolic parameters   . S/P AVR (aortic valve replacement) 10/2014   21 mm Carbometrics Mechanical Valve -- Epicardial PPM Leads  . Severe aortic stenosis 10/04/2014   Presented with Syncope & CHF    Medications:  Scheduled:  . artificial tears  1 application Both Eyes Z3A  . aspirin  81 mg Per Tube Daily  . buPROPion  200 mg Per Tube BID  . chlorhexidine gluconate (MEDLINE KIT)  15 mL Mouth Rinse BID  . cisatracurium  5 mg Intravenous Once  . famotidine  20 mg Per Tube BID  . feeding supplement  (PRO-STAT SUGAR FREE 64)  60 mL Per Tube 5 X Daily  . feeding supplement (VITAL HIGH PROTEIN)  1,000 mL Per Tube Q24H  . furosemide  20 mg Intravenous Daily  . insulin aspart  0-15 Units Subcutaneous Q4H  . ipratropium-albuterol  3 mL Nebulization Q6H  . mouth rinse  15 mL Mouth Rinse 10 times per day  . methylPREDNISolone (SOLU-MEDROL) injection  40 mg Intravenous BID  . multivitamin  15 mL Per Tube Daily  . oseltamivir  75 mg Per Tube BID  . polyethylene glycol  17 g Per Tube Daily  . senna-docusate  2 tablet Per Tube BID   Infusions:  . cisatracurium (NIMBEX) infusion 3 mcg/kg/min (03/24/18 0600)  . fentaNYL infusion INTRAVENOUS 250 mcg/hr (03/24/18 0600)  . heparin 1,750 Units/hr (03/24/18 0600)  . midazolam (VERSED) infusion 2 mg/hr (03/24/18 0600)  . piperacillin-tazobactam (ZOSYN)  IV 12.5 mL/hr at 03/24/18 0600  .  sodium bicarbonate (isotonic) infusion in sterile water 50 mL/hr at 03/24/18 0600    Assessment: Pharmacy consulted for heparin drip management for 42 yo male admitted with acute respiratory failure. Patient is currently on ARDS protocol requiring cisatritrocrium infusion. Patient previously on propofol infusion and transitioned to midazolam due to hypertriglyceridemia. Patient on warfarin as an outpatient with stated goal of 2-3, however due to multiple risk factors (left ventricular dysfunction, CHF, pacemaker and AICD placement) patient's goal in house will be 2.5-3. INR was elevated at 3.58. Warfarin for Aortic valve replacement  Per discussion on ICU rounds, will initiate heparin 1000 units/hr with no bolus for hypertriglyceridemia and discontinue warfarin while patient is receiving heparin infusion. Goal heparin level is 0.3-0.5. Patient is to receive no boluses while INR is in therapeutic range.   Patient is getting triglyceride levels Q8hr and daily INRs.   Will coordinate with CCM when to discontinue heparin and resume warfarin.    Goal of Therapy:  INR  2.5-3 Heparin level 0.3-0.5 units/ml Monitor platelets by anticoagulation protocol: Yes   Plan:  10/27 @ 0000 HL 0.28 subtherapeutic. Will increase rate to 1750 units/hr and will recheck HL @ 0700. CBC check w/ am labs.  1027 '@0609'  HL 0.30. Since barely therapeutic will slightly increase drip to 1800 units/hr. Will f/u confirmatory HL in 6 hours.  Chinita Greenland PharmD Clinical Pharmacist 03/24/2018

## 2018-03-24 NOTE — Progress Notes (Signed)
Report Given to [facility] from [facility]. [phone]. Report given to Nani Skillern RN at [place]. [phone]. Accepting physician Aubery Lapping. Patient will be transferred to Room [phone]. Per Dr Duanne Limerick he has spoken to accepting MD regarding patients saturation levels.

## 2018-03-24 NOTE — Progress Notes (Addendum)
Name: Nicholas Horton MRN: [id number] DOB: [birth year]     CONSULTATION DATE: 03/20/2018  Active & objectives: Versed + fentanyl + Nimbex + heparin + bicarb drip.  T-max 101.5  PAST MEDICAL HISTORY :   has a past medical history of CHF (congestive heart failure) ([place]), Congenital bicuspid aortic valve (09/2014), Nonischemic cardiomyopathy ([place]) -- Resolved[I42.9] (09/2014), S/P AVR (aortic valve replacement) (10/2014), and Severe aortic stenosis (10/04/2014).  has a past surgical history that includes Surgery scrotal / testicular; Cardiac catheterization (N/A, 10/04/2014); Cardiac catheterization (10/04/2014); Cardiac catheterization (N/A, 11/25/2014); Multiple extractions with alveoloplasty (N/A, 11/25/2014); Aortic valve replacement (N/A, 12/01/2014); TEE without cardioversion (N/A, 12/01/2014); transthoracic echocardiogram (03/2015); and transthoracic echocardiogram (08/2017). Prior to Admission medications   Medication Sig Start Date End Date Taking? Authorizing Provider  albuterol (PROVENTIL HFA;VENTOLIN HFA) 108 (90 Base) MCG/ACT inhaler Inhale 2 puffs into the lungs every 6 (six) hours as needed for wheezing or shortness of breath. 11/23/17  Yes Loletha Grayer, MD  aspirin EC 81 MG EC tablet Take 1 tablet (81 mg total) by mouth daily. 10/07/14  Yes Emokpae, Ejiroghene E, MD  buPROPion (WELLBUTRIN SR) 200 MG 12 hr tablet Take 1 tablet (200 mg total) by mouth 2 (two) times daily. 12/24/17  Yes Karamalegos, Devonne Doughty, DO  dicyclomine (BENTYL) 10 MG capsule Take 10 mg by mouth 4 (four) times daily as needed. 11/27/17  Yes [provider]  diltiazem (CARDIZEM CD) 120 MG 24 hr capsule Take 1 capsule (120 mg total) by mouth daily. 03/04/18 06/02/18 Yes Leonie Man, MD  furosemide (LASIX) 40 MG tablet Take one tablet by mouth daily except mondays and thursdays take two tablet  and may take an extra one dose additional if needed Patient taking differently: Take 40 mg by mouth as directed. Take one  tablet by mouth daily except mondays and thursdays take two tablet  and may take an extra one dose additional if needed 03/04/18  Yes Leonie Man, MD  Glycopyrrolate-Formoterol (BEVESPI AEROSPHERE IN) Inhale 2 puffs into the lungs 2 (two) times daily.   Yes [provider]  losartan (COZAAR) 25 MG tablet Take 1 tablet (25 mg total) by mouth daily. 12/18/17  Yes Karamalegos, Devonne Doughty, DO  metFORMIN (GLUCOPHAGE) 1000 MG tablet Take 1 tablet (1,000 mg total) by mouth 2 (two) times daily. 12/18/17  Yes Karamalegos, Alexander J, DO  OLANZapine (ZYPREXA) 20 MG tablet Take 20 mg by mouth daily. 02/20/18  Yes [provider]  oseltamivir (TAMIFLU) 75 MG capsule Take 1 capsule (75 mg total) by mouth 2 (two) times daily. For 5 days 03/18/18  Yes Karamalegos, Devonne Doughty, DO  sertraline (ZOLOFT) 50 MG tablet Take 50 mg by mouth every morning. 12/13/17  Yes [provider]  warfarin (COUMADIN) 5 MG tablet Take 1.5 tablets (7.5 mg total) by mouth daily. 02/28/18  Yes Karamalegos, Devonne Doughty, DO  amoxicillin (AMOXIL) 500 MG capsule Take 2 capsules (1,000 mg total) by mouth as needed. With any  Procedures. 03/04/18   Leonie Man, MD  Blood Glucose Monitoring Suppl (ONE TOUCH ULTRA 2) w/Device KIT See admin instructions. 12/05/17   [provider]  OLANZapine (ZYPREXA) 2.5 MG tablet Take 8 tablets (20 mg total) by mouth at bedtime. Patient not taking: Reported on 03/20/2018 11/23/17 11/23/18  Loletha Grayer, MD   Allergies  Allergen Reactions  . Other     No NSAIDS r/t blood thinners    FAMILY HISTORY:  family history is not on file. SOCIAL HISTORY:  reports that he has quit smoking. His smoking use included cigarettes. He has a 40.00 pack-year smoking history. He has quit using smokeless tobacco. He reports that he does not drink alcohol or use drugs.  REVIEW OF SYSTEMS:   Unable to obtain due to critical illness   VITAL SIGNS: Temp:  [98.4 F (36.9 C)-102.1  F (38.9 C)] 99.3 F (37.4 C) (10/27 0800) Pulse Rate:  [58-103] 66 (10/27 0800) Resp:  [19-30] 30 (10/27 0800) BP: (112-165)/(65-90) 112/65 (10/27 0800) SpO2:  [90 %-96 %] 93 % (10/27 0800) Arterial Line BP: (124-233)/(58-102) 124/58 (10/27 0800) FiO2 (%):  [93 %-95 %] 95 % (10/27 0800) Weight:  [111.6 kg] 111.6 kg (10/27 0500)  Physical Examination: Sedated and medically paralyzed On ventilator, no distress, bilateral equal air entry with no adventitious sounds S1 & S2 are audible with no murmur Benign abdominal exam with feeble peristalsis No leg edema   ASSESSMENT / PLAN: Acute respiratory failure with ARDS.PF ratio69% PEEP of 10 and 95% with PIP < 23 -Low-dose steroid, Nimbex, low tidal volume lung protective ventilator settings with permissive hypercapnia. -Monitor ABG, optimize vent settings to keep PIP less than 30 as tolerated. -Case was discussed with [facility] (Dr. Abigail Miyamoto) for further management of ARDS ([place]) and they declined transfer and advised [facility] for possible [facility]O. -Case was discussed with [facility] (Dr. Berlin Hun) he is accepted for transfer -Mr. Billy's father was notified, updated and agreed to the plan of care. -ECMO team from [facility] decided to cannulate the patient will start on the ECMO unit before transfer care because of profound hypoxemia.  Patient was successively cannulated and was started on the echo unit.  PCO2 148  Pneumonia was influenza A.Improved airspace disease of left lower lobe and right middle lobe,pulmonary congestion and bilateral pleural effusion. -c/w Zosyn + s/p Zithromax + tamiflu. MRSA PCR negative respiratory culture negative. -Monitor CXR + CBC + FiO2 -Optimize diuresis to improve lung compliance, maintain CVP<8 as renal function tolerates.  Fever T max 101.5, cultures on 10/23 -ve, MRSA PCR -ve Procal 1.06, WBC 12.3 -Empiric Zosyn, follow with repeat cultures, Procal. And consider empiric Vac and ID consult  if no improvement.  Elevated troponin with history of nonischemic cardiomyopathy status post AICD and status post aortic valve replacement for bicuspid aortic valve.Echo 03/20/2018 LVEF 30 to 35% nonischemic cardiomyopathy. -Optimizeheparin drip -Follow with cardiology evaluation  Sepsis with procalcitonin 1.88 down from 1.95.MRSA PCR negative, blood culture and respiratory culture remains negative -Zosyn + s/p Zithromax -Procalcitonin1.95 trended down to 1.06  Prerenal azotemia with intravascular volume depletionand secondary to catabolic effect of steroids. -Optimize volume, avoid nephrotoxins, monitor renal panel and urine output  Combined metabolic and respiratory alkalosis -Optimize vent settings, Bicarb. Gtt, monitor ABG and renal panel.   Possible pancreatitis with elevated LFTs (worsening), elevated serum Lipase, hypertriglyceridemia, fever 101.5, leukocytosis. On Heparin gtt. -Optimize meds (Hold Zoloft, Zyprexa and Tamiflu) -Start on Insulin gtt. -Monitor LFTs, Lipase and TG -CT abd & Pel    Anemia -Keep hemoglobin more than 7 g/dL  Coagulopathy secondary to warfarin. -Currently patient on heparin drip as management for hypertriglyceridemia and anticoagulation -Monitor coags  Full code  DVT &GI prophylaxis. Continue with supportive care  Patient fianc at the bedside was updated and she agreed to the plan of care  Critical care time 60 minutes

## 2018-03-24 NOTE — Consult Note (Addendum)
MEDICATION RELATED CONSULT NOTE   Pharmacy Consult for Electrolytes  Pharmacy consulted to assist in monitoring and replacing electrolytes in this 42 y.o. male admitted on 03/20/2018 with pneumonia. Patient also has influenza A+.   Assessment/Plan:   Goals: potassium ~ 4.0 and goal magnesium ~ 2.0 K 4.2  Mag 2.5  Phos 3.5 Scr 0.56 Patient ordered furosemide 20 mg IV daily.  Patient's electrolytes are WNL. No supplementation is required at this time.   Will f/u BMP with am labs. F/u Mag/Phos in 2 days since appears stable.  Pharmacy will continue to follow and adjust the electrolytes as needed.  Larisha Vencill A, 03/24/2018,8:08 AM

## 2018-03-24 NOTE — Progress Notes (Signed)
[facility] at bedside. Surgeon obtained consent form from Mr.Sear (Father) for ECMO procedure. [facility] care team assuming care of patient at this point.

## 2018-03-24 NOTE — Progress Notes (Signed)
[facility] in room, transferring drips over to their pumps.

## 2018-03-24 NOTE — Consult Note (Signed)
Pharmacy Antibiotic Note  Nicholas Horton is a 42 y.o. male admitted on 03/20/2018 with pneumonia.  Pharmacy has been consulted for Zosyn dosing. Patient also is influenza A +. Patient is currently intubated.  Plan: Day 4- Zosyn EI 3.375g IV Q8hr.  Zosyn started 03/21/18  Patient is also receiving Tamiflu 75 mg VT BID.   Height: 6\' 2"  (188 cm) Weight: 246 lb 0.5 oz (111.6 kg) IBW/kg (Calculated) : 82.2  Temp (24hrs), Avg:100 F (37.8 C), Min:98.4 F (36.9 C), Max:102.1 F (38.9 C)  Recent Labs  Lab 03/20/18 0235 03/20/18 0302 03/20/18 0444 03/20/18 0717 03/20/18 0915 03/21/18 0510 03/22/18 0351 03/23/18 0415 03/23/18 2016 03/24/18 0420  WBC 9.8  --   --   --   --  10.6* 7.9 9.9  --  12.3*  CREATININE 1.05  --   --   --   --  0.88 0.72 0.72 0.76 0.56*  LATICACIDVEN  --  1.2 1.3 1.4 1.4  --   --   --   --   --     Estimated Creatinine Clearance: 161.6 mL/min (A) (by C-G formula based on SCr of 0.56 mg/dL (L)).    Allergies  Allergen Reactions  . Other     No NSAIDS r/t blood thinners    Antimicrobials this admission: Zosyn 10/24 >>  Azithromycin 10/23 >> 10/25 Ceftriaxone 10/23 >> 10/24  10/23 Vancomycin x 1   Dose adjustments this admission: N/A  Microbiology results: 10/23 BCx 2/2: no growth x 2 days   10/23 Respiratory Cx: no growth  10/23 MRSA PCR: (-)  Thank you for allowing pharmacy to be a part of this patient's care.   Nicholas Horton A 03/24/2018 8:15 AM

## 2018-03-24 NOTE — Progress Notes (Signed)
[employer] - [place] at [facility]   PATIENT NAME: Ardarius Haasch    MR#:  [id number]  DATE OF BIRTH:  1975-08-18  SUBJECTIVE: Currently ill, intubated, sedated.  Still on high FiO2 at 95%.  CHIEF COMPLAINT:   Chief Complaint  Patient presents with  . Shortness of Breath   REVIEW OF SYSTEMS:  Review of Systems  Unable to perform ROS: Critical illness   DRUG ALLERGIES:   Allergies  Allergen Reactions  . Other     No NSAIDS r/t blood thinners   VITALS:  Blood pressure 137/86, pulse 83, temperature (!) 101.1 F (38.4 C), resp. rate (!) 30, height 6\' 2"  (1.88 m), weight 111.6 kg, SpO2 (!) 83 %. PHYSICAL EXAMINATION:  Physical Exam  HENT:  Head: Normocephalic and atraumatic.  ett in place  Eyes: Pupils are equal, round, and reactive to light. Conjunctivae and EOM are normal.  Neck: Normal range of motion. Neck supple. No tracheal deviation present. No thyromegaly present.  Cardiovascular: Normal rate, regular rhythm and normal heart sounds.  Pulmonary/Chest: Effort normal and breath sounds normal. No respiratory distress. He has no wheezes. He exhibits no tenderness.  Abdominal: Soft. Bowel sounds are normal. He exhibits no distension. There is no tenderness.  Musculoskeletal: Normal range of motion.  Neurological: No cranial nerve deficit.  Sedated on vent  Skin: Skin is warm and dry. No rash noted.   LABORATORY PANEL:  Male CBC Recent Labs  Lab 03/24/18 0420  WBC 12.3*  HGB 11.3*  HCT 36.4*  PLT 273   ------------------------------------------------------------------------------------------------------------------ Chemistries  Recent Labs  Lab 03/24/18 0420  NA 149*  K 4.2  CL 103  CO2 36*  GLUCOSE 166*  BUN 34*  CREATININE 0.56*  CALCIUM 8.4*  MG 2.5*  AST 188*  ALT 389*  ALKPHOS 42  BILITOT 1.0   RADIOLOGY:  Dg Chest Port 1 View  Result Date: 03/24/2018 CLINICAL DATA:  42 year old male with respiratory distress,  influenza A, ARDS. EXAM: PORTABLE CHEST 1 VIEW COMPARISON:  0527 hours today and earlier. FINDINGS: Portable AP semi upright view at 1147 hours. Stable endotracheal tube tip between the clavicles and carina. Enteric tube courses to the left abdomen, tip not included. Stable right IJ central line. Improving right lung ventilation with resolving confluent bandlike opacity in the right mid lung. Residual veiling opacity in both lungs and dense retrocardiac opacity. No pneumothorax. Stable pulmonary vascularity without overt edema. Stable cardiac size and mediastinal contours. Left chest pacemaker. Prior sternotomy. IMPRESSION: 1.  Stable lines and tubes. 2. Resolving confluent right lung airspace disease. 3. Suspect underlying bilateral pleural effusions and lower lobe collapse or consolidation. Electronically Signed   By: Odessa Fleming M.D.   On: 03/24/2018 12:24   Dg Chest Port 1 View  Result Date: 03/24/2018 CLINICAL DATA:  Acute respiratory failure. EXAM: PORTABLE CHEST 1 VIEW COMPARISON:  03/23/2018. FINDINGS: Endotracheal tube in satisfactory position. Nasogastric tube extending into the stomach. Stable epicardial pacemaker leads. Right jugular catheter tip in the superior vena cava. Stable mildly enlarged cardiac silhouette, dense left lower lobe airspace opacity and dense opacity in the inferior aspect of the right upper lobe. Mildly improved aeration at the right lung base with residual ill-defined increased density. Median sternotomy wires. Mild thoracic spine degenerative changes. IMPRESSION: 1. Mildly improved aeration at the right lung base. 2. Stable dense left lower lobe atelectasis or pneumonia. 3. Stable dense right upper lobe atelectasis or pneumonia. 4. Stable cardiomegaly. Electronically Signed   By: Viviann Spare  Azucena Kuba M.D.   On: 03/24/2018 08:43   ASSESSMENT AND PLAN:  This is a 42 year old male admitted for respiratory failure.  1. Respiratory failure: Acute; with hypoxia: developing ARDS pattern.  requiring full mechanical venti support - vent mgmt per PCCM, putting 95% FiO2.  Patient is on PEEP 10 2. Pneumonia: Community-acquired; Abx changed to IV Zosyn 3. Influenza A: Continue oseltamivir 4. CHF: Echo shows EF 30-35%, H/o severe aortic stenosis s/p valve replacement, patient had AICD placement, patient is on heparin drip. 5. Elevated troponins: due to supply demand ischemia 6 sepsis due to pneumonia, flu: Continue Zosyn, Zithromax Patient condition critically ill with acute respiratory failure with ARDS secondary to pneumonia and influenza. Critically ill, being transferred to [facility]. 7. elevated LFTs: Holding Zoloft, Zyprexa.  Monitor the trend. #8 /supratherapeutic INR: INR 3.58 decreased 1.93.  Hold Coumadin, continue heparin.   All the records are reviewed and case discussed with Care Management/Social Worker. Management plans discussed with the patient, nursing and they are in agreement.  CODE STATUS: Full Code  TOTAL TIME TAKING CARE OF THIS PATIENT: 25 minutes.   More than 50% of the time was spent in counseling/coordination of care: Wylene Men M.D on 03/24/2018 at 1:23 PM  Between 7am to 6pm - Pager - [phone]  After 6pm go to [email or web address] - Social research officer, government  [facility]  Office  [phone]  CC: Primary care physician; Smitty Cords, DO  Note: This dictation was prepared with Dragon dictation along with smaller phrase technology. Any transcriptional errors that result from this process are unintentional.

## 2018-03-24 NOTE — Progress Notes (Signed)
Per [facility], a team from [facility] will be coming to start ecmo. Transferred drips back to our ICU pumps.

## 2018-03-24 NOTE — Progress Notes (Signed)
Patient started to drop oxygen levels when nursing staff taking patient down to elevator for CT procedure. RN did not get into elevator and brought patient back into room. Dr Duanne Limerick notified and orders given to Respitory for vent changes . Patient significant other in room and Dr Duanne Limerick spoke with her. Dr Duanne Limerick aware that patients oxygen level is around 80%. MD spoke with Accepting physician at [facility] regarding his oxygen level.

## 2018-03-25 DIAGNOSIS — D72829 Elevated white blood cell count, unspecified: Secondary | ICD-10-CM | POA: Diagnosis not present

## 2018-03-25 DIAGNOSIS — G4733 Obstructive sleep apnea (adult) (pediatric): Secondary | ICD-10-CM | POA: Diagnosis not present

## 2018-03-25 DIAGNOSIS — J09X2 Influenza due to identified novel influenza A virus with other respiratory manifestations: Secondary | ICD-10-CM | POA: Diagnosis not present

## 2018-03-25 DIAGNOSIS — R69 Illness, unspecified: Secondary | ICD-10-CM | POA: Diagnosis not present

## 2018-03-25 DIAGNOSIS — J9621 Acute and chronic respiratory failure with hypoxia: Secondary | ICD-10-CM | POA: Diagnosis not present

## 2018-03-25 DIAGNOSIS — K760 Fatty (change of) liver, not elsewhere classified: Secondary | ICD-10-CM | POA: Diagnosis not present

## 2018-03-25 DIAGNOSIS — I5020 Unspecified systolic (congestive) heart failure: Secondary | ICD-10-CM | POA: Diagnosis not present

## 2018-03-25 DIAGNOSIS — Z9281 Personal history of extracorporeal membrane oxygenation (ECMO): Secondary | ICD-10-CM | POA: Diagnosis not present

## 2018-03-25 DIAGNOSIS — I110 Hypertensive heart disease with heart failure: Secondary | ICD-10-CM | POA: Diagnosis not present

## 2018-03-25 DIAGNOSIS — I255 Ischemic cardiomyopathy: Secondary | ICD-10-CM | POA: Diagnosis not present

## 2018-03-25 DIAGNOSIS — E785 Hyperlipidemia, unspecified: Secondary | ICD-10-CM | POA: Diagnosis not present

## 2018-03-25 DIAGNOSIS — R7989 Other specified abnormal findings of blood chemistry: Secondary | ICD-10-CM | POA: Diagnosis not present

## 2018-03-25 DIAGNOSIS — K219 Gastro-esophageal reflux disease without esophagitis: Secondary | ICD-10-CM | POA: Diagnosis not present

## 2018-03-25 DIAGNOSIS — K828 Other specified diseases of gallbladder: Secondary | ICD-10-CM | POA: Diagnosis not present

## 2018-03-25 LAB — BLOOD GAS, ARTERIAL
ACID-BASE EXCESS: 14.1 mmol/L — AB (ref 0.0–2.0)
Acid-Base Excess: 13 mmol/L — ABNORMAL HIGH (ref 0.0–2.0)
Acid-Base Excess: 12.6 mmol/L — ABNORMAL HIGH (ref 0.0–2.0)
Acid-Base Excess: 11 mmol/L — ABNORMAL HIGH (ref 0.0–2.0)
BICARBONATE: 42.1 mmol/L — AB (ref 20.0–28.0)
BICARBONATE: 35.8 mmol/L — AB (ref 20.0–28.0)
Bicarbonate: 44.3 mmol/L — ABNORMAL HIGH (ref 20.0–28.0)
Bicarbonate: 41.7 mmol/L — ABNORMAL HIGH (ref 20.0–28.0)
FIO2: 95
FIO2: 100
FIO2: 100
FIO2: 80
LHR: 28 {breaths}/min
MECHVT: 400 mL
MECHVT: 380 mL
MECHVT: 380 mL
O2 SAT: 99.2 %
O2 Saturation: 89 %
O2 Saturation: 85.1 %
O2 Saturation: 98.8 %
PATIENT TEMPERATURE: 37
PEEP/CPAP: 10 cmH2O
PEEP/CPAP: 20 cmH2O
PEEP/CPAP: 20 cmH2O
PEEP: 18 cmH2O
PH ART: 7.31 — AB (ref 7.350–7.450)
PH ART: 7.33 — AB (ref 7.350–7.450)
PO2 ART: 55 mmHg — AB (ref 83.0–108.0)
PO2 ART: 115 mmHg — AB (ref 83.0–108.0)
Patient temperature: 37
Patient temperature: 37
Patient temperature: 37
RATE: 30 resp/min
RATE: 30 resp/min
RATE: 30 resp/min
VT: 400 mL
pCO2 arterial: 78 mmHg (ref 32.0–48.0)
pCO2 arterial: 88 mmHg (ref 32.0–48.0)
pCO2 arterial: 79 mmHg (ref 32.0–48.0)
pCO2 arterial: 47 mmHg (ref 32.0–48.0)
pH, Arterial: 7.34 — ABNORMAL LOW (ref 7.350–7.450)
pH, Arterial: 7.49 — ABNORMAL HIGH (ref 7.350–7.450)
pO2, Arterial: 60 mmHg — ABNORMAL LOW (ref 83.0–108.0)
pO2, Arterial: 148 mmHg — ABNORMAL HIGH (ref 83.0–108.0)

## 2018-03-25 LAB — PREPARE FRESH FROZEN PLASMA
Unit division: 0
Unit division: 0

## 2018-03-25 LAB — BPAM FFP
BLOOD PRODUCT EXPIRATION DATE: 201911012359
BLOOD PRODUCT EXPIRATION DATE: 201911012359
ISSUE DATE / TIME: 201910271629
ISSUE DATE / TIME: 201910271629
UNIT TYPE AND RH: 6200
Unit Type and Rh: 6200

## 2018-03-25 LAB — CULTURE, BLOOD (ROUTINE X 2)
CULTURE: NO GROWTH
Culture: NO GROWTH
SPECIAL REQUESTS: ADEQUATE

## 2018-03-26 DIAGNOSIS — J09X2 Influenza due to identified novel influenza A virus with other respiratory manifestations: Secondary | ICD-10-CM | POA: Diagnosis not present

## 2018-03-26 DIAGNOSIS — I255 Ischemic cardiomyopathy: Secondary | ICD-10-CM | POA: Diagnosis not present

## 2018-03-26 DIAGNOSIS — G4733 Obstructive sleep apnea (adult) (pediatric): Secondary | ICD-10-CM | POA: Diagnosis not present

## 2018-03-26 DIAGNOSIS — I5020 Unspecified systolic (congestive) heart failure: Secondary | ICD-10-CM | POA: Diagnosis not present

## 2018-03-26 DIAGNOSIS — E785 Hyperlipidemia, unspecified: Secondary | ICD-10-CM | POA: Diagnosis not present

## 2018-03-26 DIAGNOSIS — R69 Illness, unspecified: Secondary | ICD-10-CM | POA: Diagnosis not present

## 2018-03-26 DIAGNOSIS — K219 Gastro-esophageal reflux disease without esophagitis: Secondary | ICD-10-CM | POA: Diagnosis not present

## 2018-03-26 DIAGNOSIS — Z9281 Personal history of extracorporeal membrane oxygenation (ECMO): Secondary | ICD-10-CM | POA: Diagnosis not present

## 2018-03-26 DIAGNOSIS — R918 Other nonspecific abnormal finding of lung field: Secondary | ICD-10-CM | POA: Diagnosis not present

## 2018-03-26 DIAGNOSIS — J9621 Acute and chronic respiratory failure with hypoxia: Secondary | ICD-10-CM | POA: Diagnosis not present

## 2018-03-26 DIAGNOSIS — D72829 Elevated white blood cell count, unspecified: Secondary | ICD-10-CM | POA: Diagnosis not present

## 2018-03-26 DIAGNOSIS — I110 Hypertensive heart disease with heart failure: Secondary | ICD-10-CM | POA: Diagnosis not present

## 2018-03-26 LAB — PREPARE RBC (CROSSMATCH)

## 2018-03-27 DIAGNOSIS — R918 Other nonspecific abnormal finding of lung field: Secondary | ICD-10-CM | POA: Diagnosis not present

## 2018-03-27 DIAGNOSIS — G4733 Obstructive sleep apnea (adult) (pediatric): Secondary | ICD-10-CM | POA: Diagnosis not present

## 2018-03-27 DIAGNOSIS — Z950 Presence of cardiac pacemaker: Secondary | ICD-10-CM | POA: Diagnosis not present

## 2018-03-27 DIAGNOSIS — E785 Hyperlipidemia, unspecified: Secondary | ICD-10-CM | POA: Diagnosis not present

## 2018-03-27 DIAGNOSIS — J09X2 Influenza due to identified novel influenza A virus with other respiratory manifestations: Secondary | ICD-10-CM | POA: Diagnosis not present

## 2018-03-27 DIAGNOSIS — I5020 Unspecified systolic (congestive) heart failure: Secondary | ICD-10-CM | POA: Diagnosis not present

## 2018-03-27 DIAGNOSIS — D72829 Elevated white blood cell count, unspecified: Secondary | ICD-10-CM | POA: Diagnosis not present

## 2018-03-27 DIAGNOSIS — J90 Pleural effusion, not elsewhere classified: Secondary | ICD-10-CM | POA: Diagnosis not present

## 2018-03-27 DIAGNOSIS — I110 Hypertensive heart disease with heart failure: Secondary | ICD-10-CM | POA: Diagnosis not present

## 2018-03-27 DIAGNOSIS — I255 Ischemic cardiomyopathy: Secondary | ICD-10-CM | POA: Diagnosis not present

## 2018-03-27 DIAGNOSIS — J9621 Acute and chronic respiratory failure with hypoxia: Secondary | ICD-10-CM | POA: Diagnosis not present

## 2018-03-27 DIAGNOSIS — Z9281 Personal history of extracorporeal membrane oxygenation (ECMO): Secondary | ICD-10-CM | POA: Diagnosis not present

## 2018-03-27 DIAGNOSIS — K219 Gastro-esophageal reflux disease without esophagitis: Secondary | ICD-10-CM | POA: Diagnosis not present

## 2018-03-27 DIAGNOSIS — J9811 Atelectasis: Secondary | ICD-10-CM | POA: Diagnosis not present

## 2018-03-27 DIAGNOSIS — R69 Illness, unspecified: Secondary | ICD-10-CM | POA: Diagnosis not present

## 2018-03-28 DIAGNOSIS — J09X2 Influenza due to identified novel influenza A virus with other respiratory manifestations: Secondary | ICD-10-CM | POA: Diagnosis not present

## 2018-03-28 DIAGNOSIS — J9621 Acute and chronic respiratory failure with hypoxia: Secondary | ICD-10-CM | POA: Diagnosis not present

## 2018-03-28 DIAGNOSIS — I5020 Unspecified systolic (congestive) heart failure: Secondary | ICD-10-CM | POA: Diagnosis not present

## 2018-03-28 DIAGNOSIS — R918 Other nonspecific abnormal finding of lung field: Secondary | ICD-10-CM | POA: Diagnosis not present

## 2018-03-28 DIAGNOSIS — J9811 Atelectasis: Secondary | ICD-10-CM | POA: Diagnosis not present

## 2018-03-28 DIAGNOSIS — E785 Hyperlipidemia, unspecified: Secondary | ICD-10-CM | POA: Diagnosis not present

## 2018-03-28 DIAGNOSIS — J90 Pleural effusion, not elsewhere classified: Secondary | ICD-10-CM | POA: Diagnosis not present

## 2018-03-28 DIAGNOSIS — K219 Gastro-esophageal reflux disease without esophagitis: Secondary | ICD-10-CM | POA: Diagnosis not present

## 2018-03-28 DIAGNOSIS — I110 Hypertensive heart disease with heart failure: Secondary | ICD-10-CM | POA: Diagnosis not present

## 2018-03-28 DIAGNOSIS — J811 Chronic pulmonary edema: Secondary | ICD-10-CM | POA: Diagnosis not present

## 2018-03-28 DIAGNOSIS — Z9281 Personal history of extracorporeal membrane oxygenation (ECMO): Secondary | ICD-10-CM | POA: Diagnosis not present

## 2018-03-28 DIAGNOSIS — G4733 Obstructive sleep apnea (adult) (pediatric): Secondary | ICD-10-CM | POA: Diagnosis not present

## 2018-03-28 DIAGNOSIS — D72829 Elevated white blood cell count, unspecified: Secondary | ICD-10-CM | POA: Diagnosis not present

## 2018-03-28 DIAGNOSIS — I255 Ischemic cardiomyopathy: Secondary | ICD-10-CM | POA: Diagnosis not present

## 2018-03-28 DIAGNOSIS — R69 Illness, unspecified: Secondary | ICD-10-CM | POA: Diagnosis not present

## 2018-03-28 LAB — CUP PACEART REMOTE DEVICE CHECK
Battery Voltage: 2.91 V
Brady Statistic AP VP Percent: 5.39 %
Brady Statistic AS VP Percent: 94.47 %
Brady Statistic AS VS Percent: 0.13 %
Brady Statistic RA Percent Paced: 5.38 %
Brady Statistic RV Percent Paced: 99.67 %
Implantable Lead Implant Date: 20160705
Implantable Lead Implant Date: 20160705
Implantable Lead Implant Date: 20160705
Implantable Lead Location: 753858
Implantable Lead Location: 753859
Implantable Lead Model: 5071
Implantable Pulse Generator Implant Date: 20160705
Lead Channel Impedance Value: 4047 Ohm
Lead Channel Impedance Value: 4047 Ohm
Lead Channel Impedance Value: 4047 Ohm
Lead Channel Impedance Value: 4047 Ohm
Lead Channel Impedance Value: 513 Ohm
Lead Channel Impedance Value: 551 Ohm
Lead Channel Pacing Threshold Amplitude: 0.875 V
Lead Channel Pacing Threshold Amplitude: 1.875 V
Lead Channel Pacing Threshold Pulse Width: 0.4 ms
Lead Channel Pacing Threshold Pulse Width: 1 ms
Lead Channel Sensing Intrinsic Amplitude: 12.25 mV
Lead Channel Sensing Intrinsic Amplitude: 12.25 mV
Lead Channel Sensing Intrinsic Amplitude: 4.75 mV
Lead Channel Sensing Intrinsic Amplitude: 4.75 mV
Lead Channel Setting Pacing Amplitude: 1.75 V
Lead Channel Setting Pacing Amplitude: 2.5 V
Lead Channel Setting Pacing Pulse Width: 1 ms
MDC IDC LEAD LOCATION: 753860
MDC IDC MSMT BATTERY REMAINING LONGEVITY: 17 mo
MDC IDC MSMT LEADCHNL LV IMPEDANCE VALUE: 361 Ohm
MDC IDC MSMT LEADCHNL RA IMPEDANCE VALUE: 855 Ohm
MDC IDC MSMT LEADCHNL RA PACING THRESHOLD PULSEWIDTH: 0.4 ms
MDC IDC MSMT LEADCHNL RV IMPEDANCE VALUE: 4047 Ohm
MDC IDC MSMT LEADCHNL RV PACING THRESHOLD AMPLITUDE: 2.5 V
MDC IDC SESS DTM: 20191008065317
MDC IDC SET LEADCHNL RV PACING AMPLITUDE: 4 V
MDC IDC SET LEADCHNL RV PACING PULSEWIDTH: 1 ms
MDC IDC SET LEADCHNL RV SENSING SENSITIVITY: 4 mV
MDC IDC STAT BRADY AP VS PERCENT: 0.01 %

## 2018-03-29 ENCOUNTER — Telehealth: Payer: Self-pay | Admitting: Family Medicine

## 2018-03-29 DIAGNOSIS — G4733 Obstructive sleep apnea (adult) (pediatric): Secondary | ICD-10-CM | POA: Diagnosis not present

## 2018-03-29 DIAGNOSIS — I5020 Unspecified systolic (congestive) heart failure: Secondary | ICD-10-CM | POA: Diagnosis not present

## 2018-03-29 DIAGNOSIS — J90 Pleural effusion, not elsewhere classified: Secondary | ICD-10-CM | POA: Diagnosis not present

## 2018-03-29 DIAGNOSIS — I255 Ischemic cardiomyopathy: Secondary | ICD-10-CM | POA: Diagnosis not present

## 2018-03-29 DIAGNOSIS — I517 Cardiomegaly: Secondary | ICD-10-CM | POA: Diagnosis not present

## 2018-03-29 DIAGNOSIS — J9811 Atelectasis: Secondary | ICD-10-CM | POA: Diagnosis not present

## 2018-03-29 DIAGNOSIS — J9621 Acute and chronic respiratory failure with hypoxia: Secondary | ICD-10-CM | POA: Diagnosis not present

## 2018-03-29 DIAGNOSIS — J09X2 Influenza due to identified novel influenza A virus with other respiratory manifestations: Secondary | ICD-10-CM | POA: Diagnosis not present

## 2018-03-29 DIAGNOSIS — D72829 Elevated white blood cell count, unspecified: Secondary | ICD-10-CM | POA: Diagnosis not present

## 2018-03-29 DIAGNOSIS — I110 Hypertensive heart disease with heart failure: Secondary | ICD-10-CM | POA: Diagnosis not present

## 2018-03-29 DIAGNOSIS — K219 Gastro-esophageal reflux disease without esophagitis: Secondary | ICD-10-CM | POA: Diagnosis not present

## 2018-03-29 DIAGNOSIS — R69 Illness, unspecified: Secondary | ICD-10-CM | POA: Diagnosis not present

## 2018-03-29 DIAGNOSIS — R1312 Dysphagia, oropharyngeal phase: Secondary | ICD-10-CM | POA: Diagnosis not present

## 2018-03-29 DIAGNOSIS — R918 Other nonspecific abnormal finding of lung field: Secondary | ICD-10-CM | POA: Diagnosis not present

## 2018-03-29 DIAGNOSIS — E785 Hyperlipidemia, unspecified: Secondary | ICD-10-CM | POA: Diagnosis not present

## 2018-03-29 DIAGNOSIS — Z9281 Personal history of extracorporeal membrane oxygenation (ECMO): Secondary | ICD-10-CM | POA: Diagnosis not present

## 2018-03-29 LAB — CULTURE, BLOOD (ROUTINE X 2)
CULTURE: NO GROWTH
CULTURE: NO GROWTH
Special Requests: ADEQUATE

## 2018-03-29 NOTE — Telephone Encounter (Signed)
Patient's friend, Nicholas Horton, who is his emergency contact and designated party release (form signed and scanned into chart), has contact me on her MyChart (she is also my patient), and asking about updates and questions regarding [name]'s illness.  I responded to her mychart and asked that she make time to speak with me to answer some questions of hers over the phone. I attempted to call earlier this week could not reach her. She requested that I call today Fri 11/1 after 5pm. I called her about 545pm and spoke with her briefly for only a minute or two, she stated she was out at the grocery store and her phone battery may not last, the call was disconnected after she asked a few questions but before I could answer,  And I could not call her back.  Here is copy of mychart message in response that I have sent her:  [name],  I will share some thoughts here on MyChart and perhaps we can try to talk again next week if you still have questions.  It is hard for me to fully explain everything during his hospitalization, as he has been very sick and has had a complicated hospital course as you know. The providers caring for him in the ICU and other doctors in the hospital should have been or should be able to explain his current situation better than me, as I am not currently involved in his hospital care.  To answer your questions that you referenced in the mychart message and that you mentioned to me today, regarding his visit with me on Monday 03/18/18, I have reviewed my documentation, and [name] was not actually complaining of a cough or shortness of breath on Monday when I saw him. He had high fever up to 103F at that time, and the rapid flu test was positive for Influenza A. His lungs on my exam were clear and did not detect any sign of focal lung abnormality or pneumonia at that time. He was treated for Influenza on that day with rx of Tamiflu.  Chest X-ray was not recommended at that time because he did  not have concerning findings on his lung exam. I distinctly advised him that if he did not improve, or if he developed signs or symptoms of pneumonia, that he would warrant a Chest X-ray and could return in follow-up as soon as possible or seek care more urgently at hospital if needed.  Typically with Influenza, a Pneumonia infection can develop during or after the course and this was discussed with him.  I understand that he unfortunately has been very sick from this illness.  Hope this is helpful in clarifying your questions. If you have more questions I can help discuss it with you as needed.  Saralyn Pilar, DO [facility] 03/29/2018, 6:00 PM

## 2018-03-30 DIAGNOSIS — J90 Pleural effusion, not elsewhere classified: Secondary | ICD-10-CM | POA: Diagnosis not present

## 2018-03-30 DIAGNOSIS — J811 Chronic pulmonary edema: Secondary | ICD-10-CM | POA: Diagnosis not present

## 2018-03-30 DIAGNOSIS — R1312 Dysphagia, oropharyngeal phase: Secondary | ICD-10-CM | POA: Diagnosis not present

## 2018-03-30 DIAGNOSIS — Z950 Presence of cardiac pacemaker: Secondary | ICD-10-CM | POA: Diagnosis not present

## 2018-03-30 DIAGNOSIS — K6389 Other specified diseases of intestine: Secondary | ICD-10-CM | POA: Diagnosis not present

## 2018-03-30 DIAGNOSIS — J80 Acute respiratory distress syndrome: Secondary | ICD-10-CM | POA: Diagnosis not present

## 2018-03-30 DIAGNOSIS — J111 Influenza due to unidentified influenza virus with other respiratory manifestations: Secondary | ICD-10-CM | POA: Diagnosis not present

## 2018-03-30 DIAGNOSIS — J9621 Acute and chronic respiratory failure with hypoxia: Secondary | ICD-10-CM | POA: Diagnosis not present

## 2018-03-30 DIAGNOSIS — R918 Other nonspecific abnormal finding of lung field: Secondary | ICD-10-CM | POA: Diagnosis not present

## 2018-03-30 MED ORDER — GENERIC EXTERNAL MEDICATION
Status: DC
Start: ? — End: 2018-03-30

## 2018-03-30 MED ORDER — OLANZAPINE 5 MG PO TABS
20.00 | ORAL_TABLET | ORAL | Status: DC
Start: 2018-04-04 — End: 2018-03-30

## 2018-03-30 MED ORDER — ALBUTEROL SULFATE HFA 108 (90 BASE) MCG/ACT IN AERS
2.00 | INHALATION_SPRAY | RESPIRATORY_TRACT | Status: DC
Start: ? — End: 2018-03-30

## 2018-03-30 MED ORDER — LOSARTAN POTASSIUM 25 MG PO TABS
25.00 | ORAL_TABLET | ORAL | Status: DC
Start: 2018-04-04 — End: 2018-03-30

## 2018-03-30 MED ORDER — BUPROPION HCL 100 MG PO TABS
100.00 | ORAL_TABLET | ORAL | Status: DC
Start: 2018-04-03 — End: 2018-03-30

## 2018-03-30 MED ORDER — GUAIFENESIN 100 MG/5ML PO SYRP
200.00 | ORAL_SOLUTION | ORAL | Status: DC
Start: ? — End: 2018-03-30

## 2018-03-30 MED ORDER — GLUCAGON HCL RDNA (DIAGNOSTIC) 1 MG IJ SOLR
1.00 | INTRAMUSCULAR | Status: DC
Start: ? — End: 2018-03-30

## 2018-03-30 MED ORDER — DILTIAZEM HCL 125 MG/25ML IV SOLN
5.00 | INTRAVENOUS | Status: DC
Start: 2018-03-30 — End: 2018-03-30

## 2018-03-30 MED ORDER — ACETAMINOPHEN 325 MG PO TABS
650.00 | ORAL_TABLET | ORAL | Status: DC
Start: ? — End: 2018-03-30

## 2018-03-30 MED ORDER — POLYETHYLENE GLYCOL 3350 17 G PO PACK
17.00 | PACK | ORAL | Status: DC
Start: 2018-03-31 — End: 2018-03-30

## 2018-03-30 MED ORDER — DEXTROSE 50 % IV SOLN
12.50 | INTRAVENOUS | Status: DC
Start: ? — End: 2018-03-30

## 2018-03-30 MED ORDER — SENNOSIDES-DOCUSATE SODIUM 8.6-50 MG PO TABS
2.00 | ORAL_TABLET | ORAL | Status: DC
Start: 2018-03-30 — End: 2018-03-30

## 2018-03-30 MED ORDER — SERTRALINE HCL 50 MG PO TABS
50.00 | ORAL_TABLET | ORAL | Status: DC
Start: 2018-04-04 — End: 2018-03-30

## 2018-03-30 MED ORDER — ATORVASTATIN CALCIUM 20 MG PO TABS
20.00 | ORAL_TABLET | ORAL | Status: DC
Start: 2018-04-04 — End: 2018-03-30

## 2018-03-30 MED ORDER — BUDESONIDE-FORMOTEROL FUMARATE 160-4.5 MCG/ACT IN AERO
2.00 | INHALATION_SPRAY | RESPIRATORY_TRACT | Status: DC
Start: 2018-04-03 — End: 2018-03-30

## 2018-03-30 MED ORDER — HEPARIN (PORCINE) IN NACL 100-0.45 UNIT/ML-% IJ SOLN
2600.00 | INTRAMUSCULAR | Status: DC
Start: ? — End: 2018-03-30

## 2018-03-30 MED ORDER — RANITIDINE HCL 150 MG PO TABS
150.00 | ORAL_TABLET | ORAL | Status: DC
Start: 2018-03-30 — End: 2018-03-30

## 2018-03-30 MED ORDER — DILTIAZEM HCL 30 MG PO TABS
30.00 | ORAL_TABLET | ORAL | Status: DC
Start: 2018-03-30 — End: 2018-03-30

## 2018-03-30 MED ORDER — DEXTROSE 10 % IV SOLN
50.00 | INTRAVENOUS | Status: DC
Start: ? — End: 2018-03-30

## 2018-03-30 MED ORDER — OSELTAMIVIR PHOSPHATE 75 MG PO CAPS
75.00 | ORAL_CAPSULE | ORAL | Status: DC
Start: 2018-03-30 — End: 2018-03-30

## 2018-03-31 DIAGNOSIS — Z4659 Encounter for fitting and adjustment of other gastrointestinal appliance and device: Secondary | ICD-10-CM | POA: Diagnosis not present

## 2018-03-31 DIAGNOSIS — R1312 Dysphagia, oropharyngeal phase: Secondary | ICD-10-CM | POA: Diagnosis not present

## 2018-04-01 DIAGNOSIS — R1312 Dysphagia, oropharyngeal phase: Secondary | ICD-10-CM | POA: Diagnosis not present

## 2018-04-02 DIAGNOSIS — R1312 Dysphagia, oropharyngeal phase: Secondary | ICD-10-CM | POA: Diagnosis not present

## 2018-04-03 DIAGNOSIS — K219 Gastro-esophageal reflux disease without esophagitis: Secondary | ICD-10-CM | POA: Diagnosis not present

## 2018-04-03 DIAGNOSIS — R1312 Dysphagia, oropharyngeal phase: Secondary | ICD-10-CM | POA: Diagnosis not present

## 2018-04-03 MED ORDER — QUETIAPINE FUMARATE 25 MG PO TABS
25.00 | ORAL_TABLET | ORAL | Status: DC
Start: ? — End: 2018-04-03

## 2018-04-03 MED ORDER — FUROSEMIDE 10 MG/ML PO SOLN
40.00 | ORAL | Status: DC
Start: 2018-04-04 — End: 2018-04-03

## 2018-04-03 MED ORDER — POLYETHYLENE GLYCOL 3350 17 G PO PACK
17.00 | PACK | ORAL | Status: DC
Start: ? — End: 2018-04-03

## 2018-04-03 MED ORDER — PANTOPRAZOLE SODIUM 40 MG PO TBEC
40.00 | DELAYED_RELEASE_TABLET | ORAL | Status: DC
Start: 2018-04-04 — End: 2018-04-03

## 2018-04-03 MED ORDER — GENERIC EXTERNAL MEDICATION
30.00 | Status: DC
Start: 2018-04-03 — End: 2018-04-03

## 2018-04-03 MED ORDER — SENNOSIDES-DOCUSATE SODIUM 8.6-50 MG PO TABS
2.00 | ORAL_TABLET | ORAL | Status: DC
Start: ? — End: 2018-04-03

## 2018-04-03 MED ORDER — GENERIC EXTERNAL MEDICATION
7.50 | Status: DC
Start: 2018-04-03 — End: 2018-04-03

## 2018-04-03 MED ORDER — MELATONIN 3 MG PO TABS
3.00 | ORAL_TABLET | ORAL | Status: DC
Start: 2018-04-03 — End: 2018-04-03

## 2018-04-08 ENCOUNTER — Ambulatory Visit (INDEPENDENT_AMBULATORY_CARE_PROVIDER_SITE_OTHER): Payer: Medicare HMO

## 2018-04-08 DIAGNOSIS — Z5181 Encounter for therapeutic drug level monitoring: Secondary | ICD-10-CM

## 2018-04-08 DIAGNOSIS — Z952 Presence of prosthetic heart valve: Secondary | ICD-10-CM

## 2018-04-08 DIAGNOSIS — E291 Testicular hypofunction: Secondary | ICD-10-CM | POA: Diagnosis not present

## 2018-04-08 LAB — POCT INR: INR: 4 — AB (ref 2.0–3.0)

## 2018-04-08 NOTE — Patient Instructions (Addendum)
Please skip coumadin tonight, then START NEW DOSAGE of 1.5 tablets daily EXCEPT 1 tablet on Mondays & Fridays.  Recheck in 2 weeks.

## 2018-04-09 DIAGNOSIS — R69 Illness, unspecified: Secondary | ICD-10-CM | POA: Diagnosis not present

## 2018-04-16 ENCOUNTER — Telehealth: Payer: Self-pay | Admitting: Cardiology

## 2018-04-16 NOTE — Telephone Encounter (Signed)
New Message   Tammy with [facility] is calling to see if patient can take a PED 5 inhibitor. Please call to discuss.

## 2018-04-16 NOTE — Telephone Encounter (Signed)
Routed to Dr. Harding to advise. 

## 2018-04-17 NOTE — Telephone Encounter (Signed)
LEFT MESSAGE - AWAITING  ANSWER FROM [place]

## 2018-04-17 NOTE — Telephone Encounter (Signed)
Should be OK - prefer short acting agent.  [name], MD

## 2018-04-18 NOTE — Telephone Encounter (Signed)
Spoke with Tammy @ [facility] in [place] she is aware of Dr.Harding's response    Marykay Lex, MD  (7:29 PM)      Should be OK - prefer short acting agent.  [name], MD     Tammy  rqst the message be faxed to [phone] to her attn (Done).

## 2018-04-22 ENCOUNTER — Ambulatory Visit (INDEPENDENT_AMBULATORY_CARE_PROVIDER_SITE_OTHER): Payer: Medicare HMO

## 2018-04-22 DIAGNOSIS — Z952 Presence of prosthetic heart valve: Secondary | ICD-10-CM | POA: Diagnosis not present

## 2018-04-22 DIAGNOSIS — Z5181 Encounter for therapeutic drug level monitoring: Secondary | ICD-10-CM

## 2018-04-22 LAB — POCT INR: INR: 2.9 (ref 2.0–3.0)

## 2018-04-22 NOTE — Patient Instructions (Signed)
Please continue dosage of 1.5 tablets daily EXCEPT 1 tablet on Mondays & Fridays.  Recheck in 3 weeks.

## 2018-04-27 DIAGNOSIS — R69 Illness, unspecified: Secondary | ICD-10-CM | POA: Diagnosis not present

## 2018-05-03 DIAGNOSIS — E139 Other specified diabetes mellitus without complications: Secondary | ICD-10-CM | POA: Diagnosis not present

## 2018-05-03 DIAGNOSIS — G4730 Sleep apnea, unspecified: Secondary | ICD-10-CM | POA: Diagnosis not present

## 2018-05-03 DIAGNOSIS — J449 Chronic obstructive pulmonary disease, unspecified: Secondary | ICD-10-CM | POA: Diagnosis not present

## 2018-05-03 DIAGNOSIS — Z7901 Long term (current) use of anticoagulants: Secondary | ICD-10-CM | POA: Diagnosis not present

## 2018-05-03 DIAGNOSIS — I5020 Unspecified systolic (congestive) heart failure: Secondary | ICD-10-CM | POA: Diagnosis not present

## 2018-05-03 DIAGNOSIS — J9621 Acute and chronic respiratory failure with hypoxia: Secondary | ICD-10-CM | POA: Diagnosis not present

## 2018-05-09 DIAGNOSIS — R69 Illness, unspecified: Secondary | ICD-10-CM | POA: Diagnosis not present

## 2018-05-15 DIAGNOSIS — R69 Illness, unspecified: Secondary | ICD-10-CM | POA: Diagnosis not present

## 2018-05-17 DIAGNOSIS — I5020 Unspecified systolic (congestive) heart failure: Secondary | ICD-10-CM | POA: Diagnosis not present

## 2018-05-21 DIAGNOSIS — G4733 Obstructive sleep apnea (adult) (pediatric): Secondary | ICD-10-CM | POA: Diagnosis not present

## 2018-05-25 DIAGNOSIS — R69 Illness, unspecified: Secondary | ICD-10-CM | POA: Diagnosis not present

## 2018-05-27 DIAGNOSIS — I5020 Unspecified systolic (congestive) heart failure: Secondary | ICD-10-CM | POA: Diagnosis not present

## 2018-05-27 DIAGNOSIS — Q231 Congenital insufficiency of aortic valve: Secondary | ICD-10-CM | POA: Diagnosis not present

## 2018-06-04 ENCOUNTER — Ambulatory Visit (INDEPENDENT_AMBULATORY_CARE_PROVIDER_SITE_OTHER): Payer: Medicare HMO

## 2018-06-04 DIAGNOSIS — I442 Atrioventricular block, complete: Secondary | ICD-10-CM | POA: Diagnosis not present

## 2018-06-05 LAB — CUP PACEART REMOTE DEVICE CHECK
Battery Remaining Longevity: 14 mo
Battery Voltage: 2.89 V
Brady Statistic AP VP Percent: 2.92 %
Brady Statistic AP VS Percent: 0.02 %
Brady Statistic AS VP Percent: 96.51 %
Brady Statistic AS VS Percent: 0.55 %
Brady Statistic RA Percent Paced: 2.92 %
Implantable Lead Implant Date: 20160705
Implantable Lead Implant Date: 20160705
Implantable Lead Implant Date: 20160705
Implantable Lead Location: 753858
Implantable Lead Location: 753859
Implantable Lead Model: 5071
Implantable Pulse Generator Implant Date: 20160705
Lead Channel Impedance Value: 4047 Ohm
Lead Channel Impedance Value: 4047 Ohm
Lead Channel Impedance Value: 4047 Ohm
Lead Channel Impedance Value: 532 Ohm
Lead Channel Pacing Threshold Amplitude: 0.875 V
Lead Channel Pacing Threshold Amplitude: 2.5 V
Lead Channel Pacing Threshold Pulse Width: 0.4 ms
Lead Channel Pacing Threshold Pulse Width: 1 ms
Lead Channel Sensing Intrinsic Amplitude: 18.625 mV
Lead Channel Sensing Intrinsic Amplitude: 18.625 mV
Lead Channel Sensing Intrinsic Amplitude: 4.25 mV
Lead Channel Setting Pacing Amplitude: 2.5 V
Lead Channel Setting Pacing Amplitude: 4 V
Lead Channel Setting Pacing Pulse Width: 1 ms
Lead Channel Setting Sensing Sensitivity: 4 mV
MDC IDC LEAD LOCATION: 753860
MDC IDC MSMT LEADCHNL LV IMPEDANCE VALUE: 342 Ohm
MDC IDC MSMT LEADCHNL LV IMPEDANCE VALUE: 4047 Ohm
MDC IDC MSMT LEADCHNL LV IMPEDANCE VALUE: 4047 Ohm
MDC IDC MSMT LEADCHNL LV PACING THRESHOLD AMPLITUDE: 2 V
MDC IDC MSMT LEADCHNL RA IMPEDANCE VALUE: 817 Ohm
MDC IDC MSMT LEADCHNL RA PACING THRESHOLD PULSEWIDTH: 0.4 ms
MDC IDC MSMT LEADCHNL RA SENSING INTR AMPL: 4.25 mV
MDC IDC MSMT LEADCHNL RV IMPEDANCE VALUE: 494 Ohm
MDC IDC SESS DTM: 20200108081726
MDC IDC SET LEADCHNL LV PACING PULSEWIDTH: 1 ms
MDC IDC SET LEADCHNL RA PACING AMPLITUDE: 1.75 V
MDC IDC STAT BRADY RV PERCENT PACED: 99.23 %

## 2018-06-06 NOTE — Progress Notes (Signed)
Remote pacemaker transmission.   

## 2018-06-10 DIAGNOSIS — Z950 Presence of cardiac pacemaker: Secondary | ICD-10-CM | POA: Diagnosis not present

## 2018-06-10 DIAGNOSIS — J9621 Acute and chronic respiratory failure with hypoxia: Secondary | ICD-10-CM | POA: Diagnosis not present

## 2018-06-10 DIAGNOSIS — Z87891 Personal history of nicotine dependence: Secondary | ICD-10-CM | POA: Diagnosis not present

## 2018-06-10 DIAGNOSIS — J449 Chronic obstructive pulmonary disease, unspecified: Secondary | ICD-10-CM | POA: Diagnosis not present

## 2018-06-10 DIAGNOSIS — I5022 Chronic systolic (congestive) heart failure: Secondary | ICD-10-CM | POA: Diagnosis not present

## 2018-06-10 DIAGNOSIS — G4733 Obstructive sleep apnea (adult) (pediatric): Secondary | ICD-10-CM | POA: Diagnosis not present

## 2018-06-10 DIAGNOSIS — Z45018 Encounter for adjustment and management of other part of cardiac pacemaker: Secondary | ICD-10-CM | POA: Diagnosis not present

## 2018-06-10 DIAGNOSIS — Z952 Presence of prosthetic heart valve: Secondary | ICD-10-CM | POA: Diagnosis not present

## 2018-06-13 DIAGNOSIS — R69 Illness, unspecified: Secondary | ICD-10-CM | POA: Diagnosis not present

## 2018-06-21 DIAGNOSIS — G4733 Obstructive sleep apnea (adult) (pediatric): Secondary | ICD-10-CM | POA: Diagnosis not present

## 2018-06-24 ENCOUNTER — Other Ambulatory Visit: Payer: Self-pay | Admitting: Family Medicine

## 2018-06-24 DIAGNOSIS — R69 Illness, unspecified: Secondary | ICD-10-CM | POA: Diagnosis not present

## 2018-06-24 DIAGNOSIS — E1169 Type 2 diabetes mellitus with other specified complication: Secondary | ICD-10-CM

## 2018-07-10 DIAGNOSIS — Z7901 Long term (current) use of anticoagulants: Secondary | ICD-10-CM | POA: Diagnosis not present

## 2018-07-10 DIAGNOSIS — I350 Nonrheumatic aortic (valve) stenosis: Secondary | ICD-10-CM | POA: Diagnosis not present

## 2018-07-22 DIAGNOSIS — G4733 Obstructive sleep apnea (adult) (pediatric): Secondary | ICD-10-CM | POA: Diagnosis not present

## 2018-07-25 DIAGNOSIS — Z7901 Long term (current) use of anticoagulants: Secondary | ICD-10-CM | POA: Diagnosis not present

## 2018-07-25 DIAGNOSIS — I480 Paroxysmal atrial fibrillation: Secondary | ICD-10-CM | POA: Diagnosis not present

## 2018-08-09 DIAGNOSIS — R69 Illness, unspecified: Secondary | ICD-10-CM | POA: Diagnosis not present

## 2018-08-16 ENCOUNTER — Other Ambulatory Visit: Payer: Self-pay | Admitting: Family Medicine

## 2018-08-16 DIAGNOSIS — I350 Nonrheumatic aortic (valve) stenosis: Secondary | ICD-10-CM

## 2018-08-16 DIAGNOSIS — Z7901 Long term (current) use of anticoagulants: Secondary | ICD-10-CM

## 2018-08-19 ENCOUNTER — Ambulatory Visit: Payer: Medicare HMO | Admitting: Family Medicine

## 2018-08-20 DIAGNOSIS — G4733 Obstructive sleep apnea (adult) (pediatric): Secondary | ICD-10-CM | POA: Diagnosis not present

## 2018-08-21 DIAGNOSIS — E139 Other specified diabetes mellitus without complications: Secondary | ICD-10-CM | POA: Diagnosis not present

## 2018-08-23 DIAGNOSIS — Z7901 Long term (current) use of anticoagulants: Secondary | ICD-10-CM | POA: Diagnosis not present

## 2018-08-23 DIAGNOSIS — I5020 Unspecified systolic (congestive) heart failure: Secondary | ICD-10-CM | POA: Diagnosis not present

## 2018-08-23 DIAGNOSIS — Q231 Congenital insufficiency of aortic valve: Secondary | ICD-10-CM | POA: Diagnosis not present

## 2018-09-02 ENCOUNTER — Other Ambulatory Visit: Payer: Self-pay | Admitting: Family Medicine

## 2018-09-02 DIAGNOSIS — I350 Nonrheumatic aortic (valve) stenosis: Secondary | ICD-10-CM

## 2018-09-02 DIAGNOSIS — Z7901 Long term (current) use of anticoagulants: Secondary | ICD-10-CM

## 2018-09-03 DIAGNOSIS — Q231 Congenital insufficiency of aortic valve: Secondary | ICD-10-CM | POA: Diagnosis not present

## 2018-09-03 DIAGNOSIS — I5020 Unspecified systolic (congestive) heart failure: Secondary | ICD-10-CM | POA: Diagnosis not present

## 2018-09-03 DIAGNOSIS — Z7901 Long term (current) use of anticoagulants: Secondary | ICD-10-CM | POA: Diagnosis not present

## 2018-09-04 DIAGNOSIS — R69 Illness, unspecified: Secondary | ICD-10-CM | POA: Diagnosis not present

## 2018-09-04 DIAGNOSIS — F411 Generalized anxiety disorder: Secondary | ICD-10-CM | POA: Diagnosis not present

## 2018-09-06 DIAGNOSIS — G4733 Obstructive sleep apnea (adult) (pediatric): Secondary | ICD-10-CM | POA: Diagnosis not present

## 2018-09-09 DIAGNOSIS — J449 Chronic obstructive pulmonary disease, unspecified: Secondary | ICD-10-CM | POA: Diagnosis not present

## 2018-09-11 DIAGNOSIS — Z45018 Encounter for adjustment and management of other part of cardiac pacemaker: Secondary | ICD-10-CM | POA: Diagnosis not present

## 2018-09-11 DIAGNOSIS — I5022 Chronic systolic (congestive) heart failure: Secondary | ICD-10-CM | POA: Diagnosis not present

## 2018-09-11 DIAGNOSIS — Z950 Presence of cardiac pacemaker: Secondary | ICD-10-CM | POA: Diagnosis not present

## 2018-09-19 DIAGNOSIS — Z952 Presence of prosthetic heart valve: Secondary | ICD-10-CM | POA: Diagnosis not present

## 2018-09-19 DIAGNOSIS — J9611 Chronic respiratory failure with hypoxia: Secondary | ICD-10-CM | POA: Diagnosis not present

## 2018-09-19 DIAGNOSIS — J9612 Chronic respiratory failure with hypercapnia: Secondary | ICD-10-CM | POA: Diagnosis not present

## 2018-09-20 DIAGNOSIS — G4733 Obstructive sleep apnea (adult) (pediatric): Secondary | ICD-10-CM | POA: Diagnosis not present

## 2018-10-09 DIAGNOSIS — E291 Testicular hypofunction: Secondary | ICD-10-CM | POA: Diagnosis not present

## 2018-10-09 DIAGNOSIS — N529 Male erectile dysfunction, unspecified: Secondary | ICD-10-CM | POA: Diagnosis not present

## 2018-10-10 DIAGNOSIS — Z952 Presence of prosthetic heart valve: Secondary | ICD-10-CM | POA: Diagnosis not present

## 2018-10-15 ENCOUNTER — Other Ambulatory Visit: Payer: Self-pay | Admitting: Family Medicine

## 2018-10-15 DIAGNOSIS — I350 Nonrheumatic aortic (valve) stenosis: Secondary | ICD-10-CM

## 2018-10-15 DIAGNOSIS — Z7901 Long term (current) use of anticoagulants: Secondary | ICD-10-CM

## 2018-10-18 DIAGNOSIS — I5022 Chronic systolic (congestive) heart failure: Secondary | ICD-10-CM | POA: Diagnosis not present

## 2018-10-18 DIAGNOSIS — Z950 Presence of cardiac pacemaker: Secondary | ICD-10-CM | POA: Diagnosis not present

## 2018-10-18 DIAGNOSIS — Z7901 Long term (current) use of anticoagulants: Secondary | ICD-10-CM | POA: Diagnosis not present

## 2018-10-18 DIAGNOSIS — G4730 Sleep apnea, unspecified: Secondary | ICD-10-CM | POA: Diagnosis not present

## 2018-10-20 DIAGNOSIS — G4733 Obstructive sleep apnea (adult) (pediatric): Secondary | ICD-10-CM | POA: Diagnosis not present

## 2018-10-31 ENCOUNTER — Other Ambulatory Visit: Payer: Self-pay | Admitting: Family Medicine

## 2018-10-31 DIAGNOSIS — Z0000 Encounter for general adult medical examination without abnormal findings: Secondary | ICD-10-CM | POA: Diagnosis not present

## 2018-10-31 DIAGNOSIS — Z7901 Long term (current) use of anticoagulants: Secondary | ICD-10-CM

## 2018-10-31 DIAGNOSIS — E139 Other specified diabetes mellitus without complications: Secondary | ICD-10-CM | POA: Diagnosis not present

## 2018-10-31 DIAGNOSIS — I350 Nonrheumatic aortic (valve) stenosis: Secondary | ICD-10-CM

## 2018-11-18 ENCOUNTER — Other Ambulatory Visit: Payer: Self-pay | Admitting: Family Medicine

## 2018-11-18 DIAGNOSIS — I350 Nonrheumatic aortic (valve) stenosis: Secondary | ICD-10-CM

## 2018-11-18 DIAGNOSIS — Z7901 Long term (current) use of anticoagulants: Secondary | ICD-10-CM

## 2018-11-20 DIAGNOSIS — F411 Generalized anxiety disorder: Secondary | ICD-10-CM | POA: Diagnosis not present

## 2018-11-20 DIAGNOSIS — G4733 Obstructive sleep apnea (adult) (pediatric): Secondary | ICD-10-CM | POA: Diagnosis not present

## 2018-11-20 DIAGNOSIS — F333 Major depressive disorder, recurrent, severe with psychotic symptoms: Secondary | ICD-10-CM | POA: Diagnosis not present

## 2018-11-20 DIAGNOSIS — R69 Illness, unspecified: Secondary | ICD-10-CM | POA: Diagnosis not present

## 2018-11-21 DIAGNOSIS — Z952 Presence of prosthetic heart valve: Secondary | ICD-10-CM | POA: Diagnosis not present

## 2018-11-22 DIAGNOSIS — E782 Mixed hyperlipidemia: Secondary | ICD-10-CM | POA: Diagnosis not present

## 2018-11-26 ENCOUNTER — Other Ambulatory Visit: Payer: Self-pay | Admitting: Family Medicine

## 2018-11-26 DIAGNOSIS — I350 Nonrheumatic aortic (valve) stenosis: Secondary | ICD-10-CM

## 2018-11-26 DIAGNOSIS — Z7901 Long term (current) use of anticoagulants: Secondary | ICD-10-CM

## 2018-12-19 DIAGNOSIS — Z952 Presence of prosthetic heart valve: Secondary | ICD-10-CM | POA: Diagnosis not present

## 2018-12-20 DIAGNOSIS — G4733 Obstructive sleep apnea (adult) (pediatric): Secondary | ICD-10-CM | POA: Diagnosis not present

## 2018-12-27 ENCOUNTER — Other Ambulatory Visit: Payer: Self-pay | Admitting: Family Medicine

## 2018-12-27 DIAGNOSIS — E1169 Type 2 diabetes mellitus with other specified complication: Secondary | ICD-10-CM

## 2018-12-31 ENCOUNTER — Other Ambulatory Visit: Payer: Self-pay | Admitting: Family Medicine

## 2018-12-31 DIAGNOSIS — E1169 Type 2 diabetes mellitus with other specified complication: Secondary | ICD-10-CM

## 2019-01-01 DIAGNOSIS — Z79899 Other long term (current) drug therapy: Secondary | ICD-10-CM | POA: Diagnosis not present

## 2019-01-01 DIAGNOSIS — R69 Illness, unspecified: Secondary | ICD-10-CM | POA: Diagnosis not present

## 2019-01-01 NOTE — Telephone Encounter (Signed)
If you get a chance, can you contact this patient's [facility] pharmacy and notify them to remove our name from his rx - we keep receiving automatic refill requests and he changed doctors back in 03/2018.  His new PCP is Dr Leonides Cave ([place])  Nobie Putnam, [facility] 01/01/2019, 8:20 AM

## 2019-01-01 NOTE — Telephone Encounter (Signed)
Pharmacy notified for changing PCP.

## 2019-01-16 DIAGNOSIS — Z952 Presence of prosthetic heart valve: Secondary | ICD-10-CM | POA: Diagnosis not present

## 2019-01-20 DIAGNOSIS — G4733 Obstructive sleep apnea (adult) (pediatric): Secondary | ICD-10-CM | POA: Diagnosis not present

## 2019-01-22 DIAGNOSIS — R69 Illness, unspecified: Secondary | ICD-10-CM | POA: Diagnosis not present

## 2019-01-23 ENCOUNTER — Other Ambulatory Visit: Payer: Self-pay | Admitting: Cardiology

## 2019-01-23 DIAGNOSIS — J449 Chronic obstructive pulmonary disease, unspecified: Secondary | ICD-10-CM | POA: Diagnosis not present

## 2019-01-23 DIAGNOSIS — G4733 Obstructive sleep apnea (adult) (pediatric): Secondary | ICD-10-CM | POA: Diagnosis not present

## 2019-01-29 DIAGNOSIS — Z79899 Other long term (current) drug therapy: Secondary | ICD-10-CM | POA: Diagnosis not present

## 2019-01-29 DIAGNOSIS — F333 Major depressive disorder, recurrent, severe with psychotic symptoms: Secondary | ICD-10-CM | POA: Diagnosis not present

## 2019-01-29 DIAGNOSIS — R69 Illness, unspecified: Secondary | ICD-10-CM | POA: Diagnosis not present

## 2019-01-31 DIAGNOSIS — E782 Mixed hyperlipidemia: Secondary | ICD-10-CM | POA: Diagnosis not present

## 2019-02-12 DIAGNOSIS — R69 Illness, unspecified: Secondary | ICD-10-CM | POA: Diagnosis not present

## 2019-02-12 DIAGNOSIS — Z79899 Other long term (current) drug therapy: Secondary | ICD-10-CM | POA: Diagnosis not present

## 2019-02-13 DIAGNOSIS — Z952 Presence of prosthetic heart valve: Secondary | ICD-10-CM | POA: Diagnosis not present

## 2019-02-14 DIAGNOSIS — I5022 Chronic systolic (congestive) heart failure: Secondary | ICD-10-CM | POA: Diagnosis not present

## 2019-02-14 DIAGNOSIS — Z45018 Encounter for adjustment and management of other part of cardiac pacemaker: Secondary | ICD-10-CM | POA: Diagnosis not present

## 2019-02-14 DIAGNOSIS — Z950 Presence of cardiac pacemaker: Secondary | ICD-10-CM | POA: Diagnosis not present

## 2019-02-20 DIAGNOSIS — G4733 Obstructive sleep apnea (adult) (pediatric): Secondary | ICD-10-CM | POA: Diagnosis not present

## 2019-03-13 DIAGNOSIS — Z952 Presence of prosthetic heart valve: Secondary | ICD-10-CM | POA: Diagnosis not present

## 2019-03-22 DIAGNOSIS — G4733 Obstructive sleep apnea (adult) (pediatric): Secondary | ICD-10-CM | POA: Diagnosis not present

## 2019-03-26 DIAGNOSIS — E291 Testicular hypofunction: Secondary | ICD-10-CM | POA: Diagnosis not present

## 2019-04-01 DIAGNOSIS — E291 Testicular hypofunction: Secondary | ICD-10-CM | POA: Diagnosis not present

## 2019-04-01 DIAGNOSIS — N529 Male erectile dysfunction, unspecified: Secondary | ICD-10-CM | POA: Diagnosis not present

## 2019-04-03 DIAGNOSIS — Z87891 Personal history of nicotine dependence: Secondary | ICD-10-CM | POA: Diagnosis not present

## 2019-04-03 DIAGNOSIS — I359 Nonrheumatic aortic valve disorder, unspecified: Secondary | ICD-10-CM | POA: Diagnosis not present

## 2019-04-03 DIAGNOSIS — M2241 Chondromalacia patellae, right knee: Secondary | ICD-10-CM | POA: Diagnosis not present

## 2019-04-03 DIAGNOSIS — M2242 Chondromalacia patellae, left knee: Secondary | ICD-10-CM | POA: Diagnosis not present

## 2019-04-08 DIAGNOSIS — M1990 Unspecified osteoarthritis, unspecified site: Secondary | ICD-10-CM | POA: Diagnosis not present

## 2019-04-08 DIAGNOSIS — I499 Cardiac arrhythmia, unspecified: Secondary | ICD-10-CM | POA: Diagnosis not present

## 2019-04-08 DIAGNOSIS — E785 Hyperlipidemia, unspecified: Secondary | ICD-10-CM | POA: Diagnosis not present

## 2019-04-08 DIAGNOSIS — R918 Other nonspecific abnormal finding of lung field: Secondary | ICD-10-CM | POA: Diagnosis not present

## 2019-04-08 DIAGNOSIS — J441 Chronic obstructive pulmonary disease with (acute) exacerbation: Secondary | ICD-10-CM | POA: Diagnosis not present

## 2019-04-08 DIAGNOSIS — R0902 Hypoxemia: Secondary | ICD-10-CM | POA: Diagnosis not present

## 2019-04-08 DIAGNOSIS — R05 Cough: Secondary | ICD-10-CM | POA: Diagnosis not present

## 2019-04-08 DIAGNOSIS — R079 Chest pain, unspecified: Secondary | ICD-10-CM | POA: Diagnosis not present

## 2019-04-08 DIAGNOSIS — I130 Hypertensive heart and chronic kidney disease with heart failure and stage 1 through stage 4 chronic kidney disease, or unspecified chronic kidney disease: Secondary | ICD-10-CM | POA: Diagnosis not present

## 2019-04-08 DIAGNOSIS — R0602 Shortness of breath: Secondary | ICD-10-CM | POA: Diagnosis not present

## 2019-04-08 DIAGNOSIS — Z20828 Contact with and (suspected) exposure to other viral communicable diseases: Secondary | ICD-10-CM | POA: Diagnosis not present

## 2019-04-08 DIAGNOSIS — R0789 Other chest pain: Secondary | ICD-10-CM | POA: Diagnosis not present

## 2019-04-08 DIAGNOSIS — N189 Chronic kidney disease, unspecified: Secondary | ICD-10-CM | POA: Diagnosis not present

## 2019-04-08 DIAGNOSIS — I4891 Unspecified atrial fibrillation: Secondary | ICD-10-CM | POA: Diagnosis not present

## 2019-04-11 DIAGNOSIS — M2242 Chondromalacia patellae, left knee: Secondary | ICD-10-CM | POA: Diagnosis not present

## 2019-04-14 DIAGNOSIS — J441 Chronic obstructive pulmonary disease with (acute) exacerbation: Secondary | ICD-10-CM | POA: Diagnosis not present

## 2019-04-14 DIAGNOSIS — Z87891 Personal history of nicotine dependence: Secondary | ICD-10-CM | POA: Diagnosis not present

## 2019-04-16 DIAGNOSIS — M2242 Chondromalacia patellae, left knee: Secondary | ICD-10-CM | POA: Diagnosis not present

## 2019-04-16 DIAGNOSIS — Z45018 Encounter for adjustment and management of other part of cardiac pacemaker: Secondary | ICD-10-CM | POA: Diagnosis not present

## 2019-04-16 DIAGNOSIS — M25562 Pain in left knee: Secondary | ICD-10-CM | POA: Diagnosis not present

## 2019-04-16 DIAGNOSIS — I5022 Chronic systolic (congestive) heart failure: Secondary | ICD-10-CM | POA: Diagnosis not present

## 2019-04-16 DIAGNOSIS — Z950 Presence of cardiac pacemaker: Secondary | ICD-10-CM | POA: Diagnosis not present

## 2019-04-16 DIAGNOSIS — M25561 Pain in right knee: Secondary | ICD-10-CM | POA: Diagnosis not present

## 2019-04-18 DIAGNOSIS — Q231 Congenital insufficiency of aortic valve: Secondary | ICD-10-CM | POA: Diagnosis not present

## 2019-04-22 DIAGNOSIS — G4733 Obstructive sleep apnea (adult) (pediatric): Secondary | ICD-10-CM | POA: Diagnosis not present

## 2019-04-30 DIAGNOSIS — R69 Illness, unspecified: Secondary | ICD-10-CM | POA: Diagnosis not present

## 2019-04-30 DIAGNOSIS — M2242 Chondromalacia patellae, left knee: Secondary | ICD-10-CM | POA: Diagnosis not present

## 2019-04-30 DIAGNOSIS — M25561 Pain in right knee: Secondary | ICD-10-CM | POA: Diagnosis not present

## 2019-04-30 DIAGNOSIS — M25562 Pain in left knee: Secondary | ICD-10-CM | POA: Diagnosis not present

## 2019-05-22 DIAGNOSIS — G4733 Obstructive sleep apnea (adult) (pediatric): Secondary | ICD-10-CM | POA: Diagnosis not present

## 2019-05-26 DIAGNOSIS — Q231 Congenital insufficiency of aortic valve: Secondary | ICD-10-CM | POA: Diagnosis not present

## 2019-06-17 DIAGNOSIS — I5022 Chronic systolic (congestive) heart failure: Secondary | ICD-10-CM | POA: Diagnosis not present

## 2019-06-17 DIAGNOSIS — Z950 Presence of cardiac pacemaker: Secondary | ICD-10-CM | POA: Diagnosis not present

## 2019-06-17 DIAGNOSIS — Z45018 Encounter for adjustment and management of other part of cardiac pacemaker: Secondary | ICD-10-CM | POA: Diagnosis not present

## 2019-07-03 DIAGNOSIS — E113393 Type 2 diabetes mellitus with moderate nonproliferative diabetic retinopathy without macular edema, bilateral: Secondary | ICD-10-CM | POA: Diagnosis not present

## 2019-07-14 DIAGNOSIS — Q231 Congenital insufficiency of aortic valve: Secondary | ICD-10-CM | POA: Diagnosis not present

## 2019-07-14 DIAGNOSIS — J9611 Chronic respiratory failure with hypoxia: Secondary | ICD-10-CM | POA: Diagnosis not present

## 2019-07-14 DIAGNOSIS — R69 Illness, unspecified: Secondary | ICD-10-CM | POA: Diagnosis not present

## 2019-07-14 DIAGNOSIS — E139 Other specified diabetes mellitus without complications: Secondary | ICD-10-CM | POA: Diagnosis not present

## 2019-07-14 DIAGNOSIS — Z87891 Personal history of nicotine dependence: Secondary | ICD-10-CM | POA: Diagnosis not present

## 2019-07-14 DIAGNOSIS — K580 Irritable bowel syndrome with diarrhea: Secondary | ICD-10-CM | POA: Diagnosis not present

## 2019-07-14 DIAGNOSIS — I5022 Chronic systolic (congestive) heart failure: Secondary | ICD-10-CM | POA: Diagnosis not present

## 2019-07-14 DIAGNOSIS — J9612 Chronic respiratory failure with hypercapnia: Secondary | ICD-10-CM | POA: Diagnosis not present

## 2019-07-16 DIAGNOSIS — Z5181 Encounter for therapeutic drug level monitoring: Secondary | ICD-10-CM | POA: Diagnosis not present

## 2019-07-16 DIAGNOSIS — Z79899 Other long term (current) drug therapy: Secondary | ICD-10-CM | POA: Diagnosis not present

## 2019-07-16 DIAGNOSIS — R69 Illness, unspecified: Secondary | ICD-10-CM | POA: Diagnosis not present

## 2019-07-21 DIAGNOSIS — Z13220 Encounter for screening for lipoid disorders: Secondary | ICD-10-CM | POA: Diagnosis not present

## 2019-07-21 DIAGNOSIS — J9612 Chronic respiratory failure with hypercapnia: Secondary | ICD-10-CM | POA: Diagnosis not present

## 2019-07-21 DIAGNOSIS — J9611 Chronic respiratory failure with hypoxia: Secondary | ICD-10-CM | POA: Diagnosis not present

## 2019-07-21 DIAGNOSIS — E139 Other specified diabetes mellitus without complications: Secondary | ICD-10-CM | POA: Diagnosis not present

## 2019-07-22 DIAGNOSIS — Z45018 Encounter for adjustment and management of other part of cardiac pacemaker: Secondary | ICD-10-CM | POA: Diagnosis not present

## 2019-07-22 DIAGNOSIS — Z950 Presence of cardiac pacemaker: Secondary | ICD-10-CM | POA: Diagnosis not present

## 2019-07-22 DIAGNOSIS — I5022 Chronic systolic (congestive) heart failure: Secondary | ICD-10-CM | POA: Diagnosis not present

## 2019-07-23 DIAGNOSIS — I359 Nonrheumatic aortic valve disorder, unspecified: Secondary | ICD-10-CM | POA: Diagnosis not present

## 2019-07-23 DIAGNOSIS — Q231 Congenital insufficiency of aortic valve: Secondary | ICD-10-CM | POA: Diagnosis not present

## 2019-07-23 DIAGNOSIS — I5022 Chronic systolic (congestive) heart failure: Secondary | ICD-10-CM | POA: Diagnosis not present

## 2019-07-23 DIAGNOSIS — Z7901 Long term (current) use of anticoagulants: Secondary | ICD-10-CM | POA: Diagnosis not present

## 2019-07-23 DIAGNOSIS — J449 Chronic obstructive pulmonary disease, unspecified: Secondary | ICD-10-CM | POA: Diagnosis not present

## 2019-07-23 DIAGNOSIS — Z950 Presence of cardiac pacemaker: Secondary | ICD-10-CM | POA: Diagnosis not present

## 2019-08-11 DIAGNOSIS — Q231 Congenital insufficiency of aortic valve: Secondary | ICD-10-CM | POA: Diagnosis not present

## 2019-08-13 DIAGNOSIS — I5022 Chronic systolic (congestive) heart failure: Secondary | ICD-10-CM | POA: Diagnosis not present

## 2019-08-13 DIAGNOSIS — Q231 Congenital insufficiency of aortic valve: Secondary | ICD-10-CM | POA: Diagnosis not present

## 2019-08-13 DIAGNOSIS — I442 Atrioventricular block, complete: Secondary | ICD-10-CM | POA: Diagnosis not present

## 2019-09-02 DIAGNOSIS — I5022 Chronic systolic (congestive) heart failure: Secondary | ICD-10-CM | POA: Diagnosis not present

## 2019-09-02 DIAGNOSIS — I442 Atrioventricular block, complete: Secondary | ICD-10-CM | POA: Diagnosis not present

## 2019-09-19 DIAGNOSIS — J9612 Chronic respiratory failure with hypercapnia: Secondary | ICD-10-CM | POA: Diagnosis not present

## 2019-09-19 DIAGNOSIS — J9611 Chronic respiratory failure with hypoxia: Secondary | ICD-10-CM | POA: Diagnosis not present

## 2019-09-22 DIAGNOSIS — Q231 Congenital insufficiency of aortic valve: Secondary | ICD-10-CM | POA: Diagnosis not present

## 2019-10-01 DIAGNOSIS — I5022 Chronic systolic (congestive) heart failure: Secondary | ICD-10-CM | POA: Diagnosis not present

## 2019-10-01 DIAGNOSIS — Z950 Presence of cardiac pacemaker: Secondary | ICD-10-CM | POA: Diagnosis not present

## 2019-10-01 DIAGNOSIS — Z45018 Encounter for adjustment and management of other part of cardiac pacemaker: Secondary | ICD-10-CM | POA: Diagnosis not present

## 2019-10-07 DIAGNOSIS — G4733 Obstructive sleep apnea (adult) (pediatric): Secondary | ICD-10-CM | POA: Diagnosis not present

## 2019-10-10 DIAGNOSIS — R69 Illness, unspecified: Secondary | ICD-10-CM | POA: Diagnosis not present

## 2019-10-20 DIAGNOSIS — Z79899 Other long term (current) drug therapy: Secondary | ICD-10-CM | POA: Diagnosis not present

## 2019-10-20 DIAGNOSIS — R69 Illness, unspecified: Secondary | ICD-10-CM | POA: Diagnosis not present

## 2019-10-20 DIAGNOSIS — G4733 Obstructive sleep apnea (adult) (pediatric): Secondary | ICD-10-CM | POA: Diagnosis not present

## 2019-10-28 DIAGNOSIS — Q231 Congenital insufficiency of aortic valve: Secondary | ICD-10-CM | POA: Diagnosis not present

## 2019-11-11 DIAGNOSIS — I359 Nonrheumatic aortic valve disorder, unspecified: Secondary | ICD-10-CM | POA: Diagnosis not present

## 2019-11-11 DIAGNOSIS — R0600 Dyspnea, unspecified: Secondary | ICD-10-CM | POA: Diagnosis not present

## 2019-11-11 DIAGNOSIS — G4733 Obstructive sleep apnea (adult) (pediatric): Secondary | ICD-10-CM | POA: Diagnosis not present

## 2019-11-19 DIAGNOSIS — R69 Illness, unspecified: Secondary | ICD-10-CM | POA: Diagnosis not present

## 2019-11-19 DIAGNOSIS — Z79899 Other long term (current) drug therapy: Secondary | ICD-10-CM | POA: Diagnosis not present

## 2019-11-20 DIAGNOSIS — G4733 Obstructive sleep apnea (adult) (pediatric): Secondary | ICD-10-CM | POA: Diagnosis not present

## 2019-11-24 DIAGNOSIS — G4733 Obstructive sleep apnea (adult) (pediatric): Secondary | ICD-10-CM | POA: Diagnosis not present

## 2019-11-24 DIAGNOSIS — I5022 Chronic systolic (congestive) heart failure: Secondary | ICD-10-CM | POA: Diagnosis not present

## 2019-11-24 DIAGNOSIS — R0600 Dyspnea, unspecified: Secondary | ICD-10-CM | POA: Diagnosis not present

## 2019-11-24 DIAGNOSIS — Z950 Presence of cardiac pacemaker: Secondary | ICD-10-CM | POA: Diagnosis not present

## 2019-11-24 DIAGNOSIS — I359 Nonrheumatic aortic valve disorder, unspecified: Secondary | ICD-10-CM | POA: Diagnosis not present

## 2019-11-27 DIAGNOSIS — I5022 Chronic systolic (congestive) heart failure: Secondary | ICD-10-CM | POA: Diagnosis not present

## 2019-11-27 DIAGNOSIS — E11649 Type 2 diabetes mellitus with hypoglycemia without coma: Secondary | ICD-10-CM | POA: Diagnosis not present

## 2019-11-27 DIAGNOSIS — E876 Hypokalemia: Secondary | ICD-10-CM | POA: Diagnosis not present

## 2019-11-27 DIAGNOSIS — R0602 Shortness of breath: Secondary | ICD-10-CM | POA: Diagnosis not present

## 2019-11-27 DIAGNOSIS — J449 Chronic obstructive pulmonary disease, unspecified: Secondary | ICD-10-CM | POA: Diagnosis not present

## 2019-11-27 DIAGNOSIS — R918 Other nonspecific abnormal finding of lung field: Secondary | ICD-10-CM | POA: Diagnosis not present

## 2019-11-27 DIAGNOSIS — R0789 Other chest pain: Secondary | ICD-10-CM | POA: Diagnosis not present

## 2019-11-27 DIAGNOSIS — K388 Other specified diseases of appendix: Secondary | ICD-10-CM | POA: Diagnosis not present

## 2019-11-27 DIAGNOSIS — R197 Diarrhea, unspecified: Secondary | ICD-10-CM | POA: Diagnosis not present

## 2019-11-27 DIAGNOSIS — R609 Edema, unspecified: Secondary | ICD-10-CM | POA: Diagnosis not present

## 2019-11-27 DIAGNOSIS — Z7901 Long term (current) use of anticoagulants: Secondary | ICD-10-CM | POA: Diagnosis not present

## 2019-11-27 DIAGNOSIS — R531 Weakness: Secondary | ICD-10-CM | POA: Diagnosis not present

## 2019-11-27 DIAGNOSIS — R1900 Intra-abdominal and pelvic swelling, mass and lump, unspecified site: Secondary | ICD-10-CM | POA: Diagnosis not present

## 2019-11-27 DIAGNOSIS — D7389 Other diseases of spleen: Secondary | ICD-10-CM | POA: Diagnosis not present

## 2019-11-28 DIAGNOSIS — R609 Edema, unspecified: Secondary | ICD-10-CM | POA: Diagnosis not present

## 2019-12-02 DIAGNOSIS — B349 Viral infection, unspecified: Secondary | ICD-10-CM | POA: Diagnosis not present

## 2019-12-02 DIAGNOSIS — Z87891 Personal history of nicotine dependence: Secondary | ICD-10-CM | POA: Diagnosis not present

## 2019-12-02 DIAGNOSIS — J22 Unspecified acute lower respiratory infection: Secondary | ICD-10-CM | POA: Diagnosis not present

## 2019-12-02 DIAGNOSIS — Q231 Congenital insufficiency of aortic valve: Secondary | ICD-10-CM | POA: Diagnosis not present

## 2019-12-03 DIAGNOSIS — R69 Illness, unspecified: Secondary | ICD-10-CM | POA: Diagnosis not present

## 2019-12-03 DIAGNOSIS — Z79899 Other long term (current) drug therapy: Secondary | ICD-10-CM | POA: Diagnosis not present

## 2019-12-18 DIAGNOSIS — Z950 Presence of cardiac pacemaker: Secondary | ICD-10-CM | POA: Diagnosis not present

## 2019-12-18 DIAGNOSIS — I429 Cardiomyopathy, unspecified: Secondary | ICD-10-CM | POA: Diagnosis not present

## 2019-12-18 DIAGNOSIS — Q231 Congenital insufficiency of aortic valve: Secondary | ICD-10-CM | POA: Diagnosis not present

## 2019-12-18 DIAGNOSIS — I5022 Chronic systolic (congestive) heart failure: Secondary | ICD-10-CM | POA: Diagnosis not present

## 2019-12-20 DIAGNOSIS — G4733 Obstructive sleep apnea (adult) (pediatric): Secondary | ICD-10-CM | POA: Diagnosis not present

## 2019-12-22 DIAGNOSIS — E291 Testicular hypofunction: Secondary | ICD-10-CM | POA: Diagnosis not present

## 2019-12-22 DIAGNOSIS — N529 Male erectile dysfunction, unspecified: Secondary | ICD-10-CM | POA: Diagnosis not present

## 2019-12-23 DIAGNOSIS — E119 Type 2 diabetes mellitus without complications: Secondary | ICD-10-CM | POA: Diagnosis not present

## 2019-12-23 DIAGNOSIS — Z950 Presence of cardiac pacemaker: Secondary | ICD-10-CM | POA: Diagnosis not present

## 2019-12-23 DIAGNOSIS — I2729 Other secondary pulmonary hypertension: Secondary | ICD-10-CM | POA: Diagnosis not present

## 2019-12-23 DIAGNOSIS — I442 Atrioventricular block, complete: Secondary | ICD-10-CM | POA: Diagnosis not present

## 2019-12-23 DIAGNOSIS — J449 Chronic obstructive pulmonary disease, unspecified: Secondary | ICD-10-CM | POA: Diagnosis not present

## 2019-12-23 DIAGNOSIS — I2510 Atherosclerotic heart disease of native coronary artery without angina pectoris: Secondary | ICD-10-CM | POA: Diagnosis not present

## 2019-12-23 DIAGNOSIS — I429 Cardiomyopathy, unspecified: Secondary | ICD-10-CM | POA: Diagnosis not present

## 2019-12-23 DIAGNOSIS — I5022 Chronic systolic (congestive) heart failure: Secondary | ICD-10-CM | POA: Diagnosis not present

## 2019-12-23 DIAGNOSIS — Z87891 Personal history of nicotine dependence: Secondary | ICD-10-CM | POA: Diagnosis not present

## 2019-12-23 DIAGNOSIS — E785 Hyperlipidemia, unspecified: Secondary | ICD-10-CM | POA: Diagnosis not present

## 2019-12-23 DIAGNOSIS — I10 Essential (primary) hypertension: Secondary | ICD-10-CM | POA: Diagnosis not present

## 2019-12-23 DIAGNOSIS — Q231 Congenital insufficiency of aortic valve: Secondary | ICD-10-CM | POA: Diagnosis not present

## 2019-12-23 DIAGNOSIS — I420 Dilated cardiomyopathy: Secondary | ICD-10-CM | POA: Diagnosis not present

## 2019-12-23 DIAGNOSIS — I272 Pulmonary hypertension, unspecified: Secondary | ICD-10-CM | POA: Diagnosis not present

## 2019-12-31 DIAGNOSIS — I5022 Chronic systolic (congestive) heart failure: Secondary | ICD-10-CM | POA: Diagnosis not present

## 2019-12-31 DIAGNOSIS — Z45018 Encounter for adjustment and management of other part of cardiac pacemaker: Secondary | ICD-10-CM | POA: Diagnosis not present

## 2019-12-31 DIAGNOSIS — Z950 Presence of cardiac pacemaker: Secondary | ICD-10-CM | POA: Diagnosis not present

## 2020-01-07 DIAGNOSIS — R69 Illness, unspecified: Secondary | ICD-10-CM | POA: Diagnosis not present

## 2020-01-07 DIAGNOSIS — F4312 Post-traumatic stress disorder, chronic: Secondary | ICD-10-CM | POA: Diagnosis not present

## 2020-01-14 DIAGNOSIS — G4733 Obstructive sleep apnea (adult) (pediatric): Secondary | ICD-10-CM | POA: Diagnosis not present

## 2020-01-21 DIAGNOSIS — E669 Obesity, unspecified: Secondary | ICD-10-CM | POA: Diagnosis not present

## 2020-01-21 DIAGNOSIS — I5022 Chronic systolic (congestive) heart failure: Secondary | ICD-10-CM | POA: Diagnosis not present

## 2020-01-21 DIAGNOSIS — G4733 Obstructive sleep apnea (adult) (pediatric): Secondary | ICD-10-CM | POA: Diagnosis not present

## 2020-01-21 DIAGNOSIS — J449 Chronic obstructive pulmonary disease, unspecified: Secondary | ICD-10-CM | POA: Diagnosis not present

## 2020-02-04 DIAGNOSIS — F4312 Post-traumatic stress disorder, chronic: Secondary | ICD-10-CM | POA: Diagnosis not present

## 2020-02-04 DIAGNOSIS — R69 Illness, unspecified: Secondary | ICD-10-CM | POA: Diagnosis not present

## 2020-02-17 DIAGNOSIS — Q231 Congenital insufficiency of aortic valve: Secondary | ICD-10-CM | POA: Diagnosis not present

## 2020-03-24 DIAGNOSIS — Z950 Presence of cardiac pacemaker: Secondary | ICD-10-CM | POA: Diagnosis not present

## 2020-03-24 DIAGNOSIS — R0600 Dyspnea, unspecified: Secondary | ICD-10-CM | POA: Diagnosis not present

## 2020-03-24 DIAGNOSIS — Z7901 Long term (current) use of anticoagulants: Secondary | ICD-10-CM | POA: Diagnosis not present

## 2020-03-24 DIAGNOSIS — I5022 Chronic systolic (congestive) heart failure: Secondary | ICD-10-CM | POA: Diagnosis not present

## 2020-03-24 DIAGNOSIS — I442 Atrioventricular block, complete: Secondary | ICD-10-CM | POA: Diagnosis not present

## 2020-03-29 DIAGNOSIS — I359 Nonrheumatic aortic valve disorder, unspecified: Secondary | ICD-10-CM | POA: Diagnosis not present

## 2020-03-29 DIAGNOSIS — Q231 Congenital insufficiency of aortic valve: Secondary | ICD-10-CM | POA: Diagnosis not present

## 2020-03-30 DIAGNOSIS — Z1159 Encounter for screening for other viral diseases: Secondary | ICD-10-CM | POA: Diagnosis not present

## 2020-03-30 DIAGNOSIS — E139 Other specified diabetes mellitus without complications: Secondary | ICD-10-CM | POA: Diagnosis not present

## 2020-03-30 DIAGNOSIS — E119 Type 2 diabetes mellitus without complications: Secondary | ICD-10-CM | POA: Diagnosis not present

## 2020-03-30 DIAGNOSIS — I5022 Chronic systolic (congestive) heart failure: Secondary | ICD-10-CM | POA: Diagnosis not present

## 2020-03-30 DIAGNOSIS — Z7901 Long term (current) use of anticoagulants: Secondary | ICD-10-CM | POA: Diagnosis not present

## 2020-03-30 DIAGNOSIS — E785 Hyperlipidemia, unspecified: Secondary | ICD-10-CM | POA: Diagnosis not present

## 2020-03-30 DIAGNOSIS — E291 Testicular hypofunction: Secondary | ICD-10-CM | POA: Diagnosis not present

## 2020-03-30 DIAGNOSIS — J449 Chronic obstructive pulmonary disease, unspecified: Secondary | ICD-10-CM | POA: Diagnosis not present

## 2020-03-30 DIAGNOSIS — G4733 Obstructive sleep apnea (adult) (pediatric): Secondary | ICD-10-CM | POA: Diagnosis not present

## 2020-03-30 DIAGNOSIS — Z0000 Encounter for general adult medical examination without abnormal findings: Secondary | ICD-10-CM | POA: Diagnosis not present

## 2020-03-30 DIAGNOSIS — R69 Illness, unspecified: Secondary | ICD-10-CM | POA: Diagnosis not present

## 2020-04-02 DIAGNOSIS — I5022 Chronic systolic (congestive) heart failure: Secondary | ICD-10-CM | POA: Diagnosis not present

## 2020-04-02 DIAGNOSIS — Z45018 Encounter for adjustment and management of other part of cardiac pacemaker: Secondary | ICD-10-CM | POA: Diagnosis not present

## 2020-04-02 DIAGNOSIS — Z950 Presence of cardiac pacemaker: Secondary | ICD-10-CM | POA: Diagnosis not present

## 2020-04-15 DIAGNOSIS — N159 Renal tubulo-interstitial disease, unspecified: Secondary | ICD-10-CM | POA: Diagnosis not present

## 2020-04-15 DIAGNOSIS — S39012A Strain of muscle, fascia and tendon of lower back, initial encounter: Secondary | ICD-10-CM | POA: Diagnosis not present

## 2020-04-16 DIAGNOSIS — G4733 Obstructive sleep apnea (adult) (pediatric): Secondary | ICD-10-CM | POA: Diagnosis not present

## 2020-04-27 DIAGNOSIS — J22 Unspecified acute lower respiratory infection: Secondary | ICD-10-CM | POA: Diagnosis not present

## 2020-04-27 DIAGNOSIS — J441 Chronic obstructive pulmonary disease with (acute) exacerbation: Secondary | ICD-10-CM | POA: Diagnosis not present

## 2020-05-05 DIAGNOSIS — R69 Illness, unspecified: Secondary | ICD-10-CM | POA: Diagnosis not present

## 2020-05-05 DIAGNOSIS — F4312 Post-traumatic stress disorder, chronic: Secondary | ICD-10-CM | POA: Diagnosis not present

## 2020-05-05 DIAGNOSIS — Q231 Congenital insufficiency of aortic valve: Secondary | ICD-10-CM | POA: Diagnosis not present

## 2020-06-01 DIAGNOSIS — J449 Chronic obstructive pulmonary disease, unspecified: Secondary | ICD-10-CM | POA: Diagnosis not present

## 2020-06-08 DIAGNOSIS — I359 Nonrheumatic aortic valve disorder, unspecified: Secondary | ICD-10-CM | POA: Diagnosis not present

## 2020-06-20 DIAGNOSIS — M79675 Pain in left toe(s): Secondary | ICD-10-CM | POA: Diagnosis not present

## 2020-06-25 DIAGNOSIS — E291 Testicular hypofunction: Secondary | ICD-10-CM | POA: Diagnosis not present

## 2020-06-25 DIAGNOSIS — I5022 Chronic systolic (congestive) heart failure: Secondary | ICD-10-CM | POA: Diagnosis not present

## 2020-07-02 DIAGNOSIS — J449 Chronic obstructive pulmonary disease, unspecified: Secondary | ICD-10-CM | POA: Diagnosis not present

## 2020-07-02 DIAGNOSIS — I5022 Chronic systolic (congestive) heart failure: Secondary | ICD-10-CM | POA: Diagnosis not present

## 2020-07-02 DIAGNOSIS — Z950 Presence of cardiac pacemaker: Secondary | ICD-10-CM | POA: Diagnosis not present

## 2020-07-02 DIAGNOSIS — Z45018 Encounter for adjustment and management of other part of cardiac pacemaker: Secondary | ICD-10-CM | POA: Diagnosis not present

## 2020-07-09 DIAGNOSIS — I5022 Chronic systolic (congestive) heart failure: Secondary | ICD-10-CM | POA: Diagnosis not present

## 2020-07-10 DIAGNOSIS — J3489 Other specified disorders of nose and nasal sinuses: Secondary | ICD-10-CM | POA: Diagnosis not present

## 2020-07-10 DIAGNOSIS — J029 Acute pharyngitis, unspecified: Secondary | ICD-10-CM | POA: Diagnosis not present

## 2020-07-10 DIAGNOSIS — R059 Cough, unspecified: Secondary | ICD-10-CM | POA: Diagnosis not present

## 2020-07-10 DIAGNOSIS — R918 Other nonspecific abnormal finding of lung field: Secondary | ICD-10-CM | POA: Diagnosis not present

## 2020-07-10 DIAGNOSIS — R069 Unspecified abnormalities of breathing: Secondary | ICD-10-CM | POA: Diagnosis not present

## 2020-07-13 DIAGNOSIS — R059 Cough, unspecified: Secondary | ICD-10-CM | POA: Diagnosis not present

## 2020-07-13 DIAGNOSIS — J449 Chronic obstructive pulmonary disease, unspecified: Secondary | ICD-10-CM | POA: Diagnosis not present

## 2020-07-21 DIAGNOSIS — Q231 Congenital insufficiency of aortic valve: Secondary | ICD-10-CM | POA: Diagnosis not present

## 2020-07-23 DIAGNOSIS — Q231 Congenital insufficiency of aortic valve: Secondary | ICD-10-CM | POA: Diagnosis not present

## 2020-07-23 DIAGNOSIS — Z950 Presence of cardiac pacemaker: Secondary | ICD-10-CM | POA: Diagnosis not present

## 2020-07-23 DIAGNOSIS — I5022 Chronic systolic (congestive) heart failure: Secondary | ICD-10-CM | POA: Diagnosis not present

## 2020-07-28 DIAGNOSIS — R69 Illness, unspecified: Secondary | ICD-10-CM | POA: Diagnosis not present

## 2020-07-30 DIAGNOSIS — J449 Chronic obstructive pulmonary disease, unspecified: Secondary | ICD-10-CM | POA: Diagnosis not present

## 2020-08-06 DIAGNOSIS — G4733 Obstructive sleep apnea (adult) (pediatric): Secondary | ICD-10-CM | POA: Diagnosis not present

## 2020-08-09 DIAGNOSIS — J449 Chronic obstructive pulmonary disease, unspecified: Secondary | ICD-10-CM | POA: Diagnosis not present

## 2020-08-09 DIAGNOSIS — R0600 Dyspnea, unspecified: Secondary | ICD-10-CM | POA: Diagnosis not present

## 2020-08-16 DIAGNOSIS — E291 Testicular hypofunction: Secondary | ICD-10-CM | POA: Diagnosis not present

## 2020-08-16 DIAGNOSIS — Z125 Encounter for screening for malignant neoplasm of prostate: Secondary | ICD-10-CM | POA: Diagnosis not present

## 2020-08-16 DIAGNOSIS — N529 Male erectile dysfunction, unspecified: Secondary | ICD-10-CM | POA: Diagnosis not present

## 2020-08-25 DIAGNOSIS — R69 Illness, unspecified: Secondary | ICD-10-CM | POA: Diagnosis not present

## 2020-12-08 ENCOUNTER — Other Ambulatory Visit: Payer: Self-pay

## 2020-12-08 ENCOUNTER — Encounter: Payer: Medicare HMO | Attending: Cardiology | Admitting: *Deleted

## 2020-12-08 DIAGNOSIS — I5022 Chronic systolic (congestive) heart failure: Secondary | ICD-10-CM

## 2020-12-08 NOTE — Progress Notes (Signed)
Initial telephone orientation completed. Diagnosis can be found in [facility] 6/21. EP orientation scheduled for Thursday 7/21 at 2pm.

## 2021-01-03 ENCOUNTER — Ambulatory Visit: Payer: Medicare HMO

## 2021-01-13 ENCOUNTER — Encounter: Payer: Medicare HMO | Attending: Cardiology

## 2021-01-13 ENCOUNTER — Other Ambulatory Visit: Payer: Self-pay

## 2021-01-13 VITALS — Ht 72.5 in | Wt 239.2 lb

## 2021-01-13 DIAGNOSIS — Z79890 Hormone replacement therapy (postmenopausal): Secondary | ICD-10-CM | POA: Insufficient documentation

## 2021-01-13 DIAGNOSIS — Z79899 Other long term (current) drug therapy: Secondary | ICD-10-CM | POA: Diagnosis not present

## 2021-01-13 DIAGNOSIS — Z7982 Long term (current) use of aspirin: Secondary | ICD-10-CM | POA: Diagnosis not present

## 2021-01-13 DIAGNOSIS — Z7901 Long term (current) use of anticoagulants: Secondary | ICD-10-CM | POA: Insufficient documentation

## 2021-01-13 DIAGNOSIS — I5022 Chronic systolic (congestive) heart failure: Secondary | ICD-10-CM | POA: Diagnosis present

## 2021-01-13 DIAGNOSIS — Z794 Long term (current) use of insulin: Secondary | ICD-10-CM | POA: Diagnosis not present

## 2021-01-13 NOTE — Patient Instructions (Signed)
Patient Instructions  Patient Details  Name: Nicholas Horton MRN: [id number] Date of Birth: 01-26-1976 Referring Provider:  Gae Gallop, *  Below are your personal goals for exercise, nutrition, and risk factors. Our goal is to help you stay on track towards obtaining and maintaining these goals. We will be discussing your progress on these goals with you throughout the program.  Initial Exercise Prescription:  Initial Exercise Prescription - 01/13/21 1500       Date of Initial Exercise RX and Referring Provider   Date 01/13/21    Referring Provider Margaretha Glassing      Treadmill   MPH 2.2    Grade 1.5    Minutes 15    METs 3.2      REL-XR   Level 3    Speed 50    Minutes 15    METs 3.2      T5 Nustep   Level 2    SPM 80    Minutes 15    METs 3.2      Prescription Details   Frequency (times per week) 3    Duration Progress to 30 minutes of continuous aerobic without signs/symptoms of physical distress      Intensity   THRR 40-80% of Max Heartrate 116-156    Ratings of Perceived Exertion 11-13    Perceived Dyspnea 0-4      Resistance Training   Training Prescription Yes    Weight 3 lb    Reps 10-15             Exercise Goals: Frequency: Be able to perform aerobic exercise two to three times per week in program working toward 2-5 days per week of home exercise.  Intensity: Work with a perceived exertion of 11 (fairly light) - 15 (hard) while following your exercise prescription.  We will make changes to your prescription with you as you progress through the program.   Duration: Be able to do 30 to 45 minutes of continuous aerobic exercise in addition to a 5 minute warm-up and a 5 minute cool-down routine.   Nutrition Goals: Your personal nutrition goals will be established when you do your nutrition analysis with the dietician.  The following are general nutrition guidelines to follow: Cholesterol < 200mg /day Sodium < 1500mg /day Fiber: Men under 50  yrs - 38 grams per day  Personal Goals:  Personal Goals and Risk Factors at Admission - 01/13/21 1559       Core Components/Risk Factors/Patient Goals on Admission    Weight Management Yes;Weight Loss    Intervention Weight Management: Develop a combined nutrition and exercise program designed to reach desired caloric intake, while maintaining appropriate intake of nutrient and fiber, sodium and fats, and appropriate energy expenditure required for the weight goal.;Weight Management: Provide education and appropriate resources to help participant work on and attain dietary goals.;Weight Management/Obesity: Establish reasonable short term and long term weight goals.    Admit Weight 239 lb 3.2 oz (108.5 kg)    Goal Weight: Short Term 225 lb (102.1 kg)    Goal Weight: Long Term 200 lb (90.7 kg)    Expected Outcomes Short Term: Continue to assess and modify interventions until short term weight is achieved;Long Term: Adherence to nutrition and physical activity/exercise program aimed toward attainment of established weight goal;Weight Loss: Understanding of general recommendations for a balanced deficit meal plan, which promotes 1-2 lb weight loss per week and includes a negative energy balance of [phone] kcal/d;Understanding of distribution of calorie intake throughout  the day with the consumption of 4-5 meals/snacks;Understanding recommendations for meals to include 15-35% energy as protein, 25-35% energy from fat, 35-60% energy from carbohydrates, less than 200mg  of dietary cholesterol, 20-35 gm of total fiber daily    Improve shortness of breath with ADL's Yes    Intervention Provide education, individualized exercise plan and daily activity instruction to help decrease symptoms of SOB with activities of daily living.    Expected Outcomes Short Term: Improve cardiorespiratory fitness to achieve a reduction of symptoms when performing ADLs;Long Term: Be able to perform more ADLs without symptoms or  delay the onset of symptoms    Diabetes Yes    Intervention Provide education about signs/symptoms and action to take for hypo/hyperglycemia.;Provide education about proper nutrition, including hydration, and aerobic/resistive exercise prescription along with prescribed medications to achieve blood glucose in normal ranges: Fasting glucose 65-99 mg/dL    Expected Outcomes Short Term: Participant verbalizes understanding of the signs/symptoms and immediate care of hyper/hypoglycemia, proper foot care and importance of medication, aerobic/resistive exercise and nutrition plan for blood glucose control.;Long Term: Attainment of HbA1C < 7%.    Heart Failure Yes    Intervention Provide a combined exercise and nutrition program that is supplemented with education, support and counseling about heart failure. Directed toward relieving symptoms such as shortness of breath, decreased exercise tolerance, and extremity edema.    Expected Outcomes Improve functional capacity of life;Short term: Attendance in program 2-3 days a week with increased exercise capacity. Reported lower sodium intake. Reported increased fruit and vegetable intake. Reports medication compliance.;Short term: Daily weights obtained and reported for increase. Utilizing diuretic protocols set by physician.;Long term: Adoption of self-care skills and reduction of barriers for early signs and symptoms recognition and intervention leading to self-care maintenance.             Tobacco Use Initial Evaluation: Social History   Tobacco Use  Smoking Status Former   Packs/day: 2.00   Years: 20.00   Pack years: 40.00   Types: Cigarettes  Smokeless Tobacco Former  Tobacco Comments   quit 10/04/14    Exercise Goals and Review:  Exercise Goals     Row Name 01/13/21 1551             Exercise Goals   Increase Physical Activity Yes       Intervention Provide advice, education, support and counseling about physical activity/exercise  needs.;Develop an individualized exercise prescription for aerobic and resistive training based on initial evaluation findings, risk stratification, comorbidities and participant's personal goals.       Expected Outcomes Short Term: Attend rehab on a regular basis to increase amount of physical activity.;Long Term: Add in home exercise to make exercise part of routine and to increase amount of physical activity.;Long Term: Exercising regularly at least 3-5 days a week.       Increase Strength and Stamina Yes       Intervention Provide advice, education, support and counseling about physical activity/exercise needs.;Develop an individualized exercise prescription for aerobic and resistive training based on initial evaluation findings, risk stratification, comorbidities and participant's personal goals.       Expected Outcomes Short Term: Increase workloads from initial exercise prescription for resistance, speed, and METs.;Short Term: Perform resistance training exercises routinely during rehab and add in resistance training at home;Long Term: Improve cardiorespiratory fitness, muscular endurance and strength as measured by increased METs and functional capacity (01/15/21)       Able to understand and use rate of perceived exertion (  RPE) scale Yes       Intervention Provide education and explanation on how to use RPE scale       Expected Outcomes Short Term: Able to use RPE daily in rehab to express subjective intensity level;Long Term:  Able to use RPE to guide intensity level when exercising independently       Able to understand and use Dyspnea scale Yes       Intervention Provide education and explanation on how to use Dyspnea scale       Expected Outcomes Short Term: Able to use Dyspnea scale daily in rehab to express subjective sense of shortness of breath during exertion;Long Term: Able to use Dyspnea scale to guide intensity level when exercising independently       Knowledge and understanding of  Target Heart Rate Range (THRR) Yes       Intervention Provide education and explanation of THRR including how the numbers were predicted and where they are located for reference       Expected Outcomes Short Term: Able to state/look up THRR;Short Term: Able to use daily as guideline for intensity in rehab;Long Term: Able to use THRR to govern intensity when exercising independently       Able to check pulse independently Yes       Intervention Provide education and demonstration on how to check pulse in carotid and radial arteries.;Review the importance of being able to check your own pulse for safety during independent exercise       Expected Outcomes Short Term: Able to explain why pulse checking is important during independent exercise;Long Term: Able to check pulse independently and accurately       Understanding of Exercise Prescription Yes                Copy of goals given to participant.

## 2021-01-13 NOTE — Progress Notes (Signed)
Cardiac Individual Treatment Plan  Patient Details  Name: Nicholas Horton MRN: [id number] Date of Birth: [birth year] Referring Provider:   Flowsheet Row Cardiac Rehab from 01/13/2021 in [facility] Cardiac and Pulmonary Rehab  Referring Provider Margaretha Glassing       Initial Encounter Date:  Flowsheet Row Cardiac Rehab from 01/13/2021 in [facility] Cardiac and Pulmonary Rehab  Date 01/13/21       Visit Diagnosis: Heart failure, chronic systolic (HCC)  Patient's Home Medications on Admission:  Current Outpatient Medications:    albuterol (VENTOLIN HFA) 108 (90 Base) MCG/ACT inhaler, Inhale into the lungs., Disp: , Rfl:    ARIPiprazole (ABILIFY) 5 MG tablet, Take by mouth., Disp: , Rfl:    artificial tears (LACRILUBE) OINT ophthalmic ointment, Place 1 application into both eyes every 8 (eight) hours. (Patient not taking: Reported on 12/08/2020), Disp: 1 Tube, Rfl: 0   aspirin 81 MG chewable tablet, Place 1 tablet (81 mg total) into feeding tube daily. (Patient not taking: Reported on 12/08/2020), Disp: 1 tablet, Rfl: 0   buPROPion (WELLBUTRIN SR) 200 MG 12 hr tablet, Take 2 tablets by mouth daily., Disp: , Rfl:    cisatracurium 200 mg in sodium chloride 0.9 % 180 mL, Inject 339.6-1,132 mcg/min into the vein continuous., Disp: 180 mL, Rfl: 0   cyclobenzaprine (FLEXERIL) 5 MG tablet, One tab @ HS for back strain/spasm., Disp: , Rfl:    dapagliflozin propanediol (FARXIGA) 10 MG TABS tablet, Take 1 tablet by mouth daily., Disp: , Rfl:    dextrose 10 % infusion, Inject 20 mL/hr into the vein continuous., Disp: 500 mL, Rfl: 0   diltiazem (CARDIZEM CD) 120 MG 24 hr capsule, Take 1 capsule (120 mg total) by mouth daily. OFFICE VISIT NEEDED (Patient not taking: Reported on 12/08/2020), Disp: 45 capsule, Rfl: 0   escitalopram (LEXAPRO) 10 MG tablet, Take by mouth., Disp: , Rfl:    fentaNYL 10 mcg/ml SOLN infusion, Inject 100-300 mcg/hr into the vein continuous., Disp: 250 mL, Rfl: 0   Fluticasone Propionate, Inhal,  (FLOVENT DISKUS) 100 MCG/BLIST AEPB, Inhale into the lungs., Disp: , Rfl:    furosemide (LASIX) 10 MG/ML injection, Inject 2 mLs (20 mg total) into the vein daily., Disp: 4 mL, Rfl: 0   glucose blood (ONETOUCH ULTRA) test strip, USE AS INSTRUCTED UP TO 3 TIMES DAILY., Disp: , Rfl:    Glycopyrrolate-Formoterol (BEVESPI AEROSPHERE) 9-4.8 MCG/ACT AERO, INHALE 2 PUFFS INTO THE LUNGS TWICE A DAY, Disp: , Rfl:    heparin 100-0.45 UNIT/ML-% infusion, Inject 1,800 Units/hr into the vein continuous., Disp: 250 mL, Rfl: 0   insulin regular, human (MYXREDLIN) 100 units/ 100 mL infusion, Inject 2 Units/hr into the vein continuous. (Patient not taking: Reported on 12/08/2020), Disp: 100 mL, Rfl: 0   ipratropium-albuterol (DUONEB) 0.5-2.5 (3) MG/3ML SOLN, Take 3 mLs by nebulization every 6 (six) hours. (Patient not taking: Reported on 12/08/2020), Disp: 360 mL, Rfl: 0   methylPREDNISolone sodium succinate (SOLU-MEDROL) 40 mg/mL injection, Inject 1 mL (40 mg total) into the vein 2 (two) times daily. (Patient not taking: Reported on 12/08/2020), Disp: 1 each, Rfl: 0   metoprolol succinate (TOPROL-XL) 25 MG 24 hr tablet, Take 1 tablet by mouth daily., Disp: , Rfl:    midazolam 50 mg in sodium chloride 0.9 % 40 mL, Inject 0-8 mg/hr into the vein continuous., Disp: 40 mL, Rfl: 0   pantoprazole sodium (PROTONIX) 40 mg/20 mL PACK, Place 20 mLs (40 mg total) into feeding tube daily. (Patient not taking: Reported on 12/08/2020), Disp:  30 each, Rfl: 0   piperacillin-tazobactam (ZOSYN) 3.375 GM/50ML IVPB, Inject 50 mLs (3.375 g total) into the vein every 8 (eight) hours., Disp: 50 mL, Rfl: 0   polyethylene glycol (MIRALAX / GLYCOLAX) packet, Place 17 g into feeding tube daily. (Patient not taking: Reported on 12/08/2020), Disp: 14 each, Rfl: 0   potassium chloride (KLOR-CON) 10 MEQ tablet, Take by mouth., Disp: , Rfl:    sacubitril-valsartan (ENTRESTO) 24-26 MG, Take 1 tablet by mouth 2 (two) times daily., Disp: , Rfl:     senna-docusate (SENOKOT-S) 8.6-50 MG tablet, Place 2 tablets into feeding tube 2 (two) times daily. (Patient not taking: Reported on 12/08/2020), Disp: 2 tablet, Rfl: 0   sodium bicarbonate 150 mEq in sterile water 850 mL, Inject 25 mL/hr into the vein continuous., Disp: 850 mL, Rfl: 0   spironolactone (ALDACTONE) 25 MG tablet, Take 1 tablet by mouth daily., Disp: , Rfl:    tadalafil (CIALIS) 20 MG tablet, Take by mouth., Disp: , Rfl:    testosterone cypionate (DEPOTESTOSTERONE CYPIONATE) 200 MG/ML injection, Inject into the muscle., Disp: , Rfl:    torsemide (DEMADEX) 20 MG tablet, , Disp: , Rfl:    warfarin (COUMADIN) 5 MG tablet, , Disp: , Rfl:   Past Medical History: Past Medical History:  Diagnosis Date   CHF (congestive heart failure) (HCC)    Congenital bicuspid aortic valve 09/2014   - s/p AVR   Nonischemic cardiomyopathy (HCC) -- Resolved[I42.9] 09/2014   EF by Echo 20-25% (pre-op AVR) --> Echo 10/2014 & 03/2015: EF 50-55% - also Recent Normal Diastolic parameters    S/P AVR (aortic valve replacement) 10/2014   21 mm Carbometrics Mechanical Valve -- Epicardial PPM Leads   Severe aortic stenosis 10/04/2014   Presented with Syncope & CHF    Tobacco Use: Social History   Tobacco Use  Smoking Status Former   Packs/day: 2.00   Years: 20.00   Pack years: 40.00   Types: Cigarettes  Smokeless Tobacco Former  Tobacco Comments   quit 10/04/14    Labs: Recent Review Flowsheet Data     Labs for ITP Cardiac and Pulmonary Rehab Latest Ref Rng & Units 03/24/2018 03/24/2018 03/24/2018 03/24/2018 03/24/2018   Cholestrol 0 - 200 mg/dL - - - - -   LDLCALC 0 - 99 mg/dL - - - - -   HDL >60 mg/dL - - - - -   Trlycerides <150 mg/dL 454(U) - - - -   Hemoglobin A1c <5.7 % of total Hgb - - - - -   PHART 7.350 - 7.450 - 7.46(H) 7.31(L) 7.33(L) 7.49(H)   PCO2ART 32.0 - 48.0 mmHg - 60(H) 88(HH) 79(HH) 47   HCO3 20.0 - 28.0 mmol/L - 42.7(H) 44.3(H) 41.7(H) 35.8(H)   TCO2 0 - 100 mmol/L - - - - -    ACIDBASEDEF 0.0 - 2.0 mmol/L - - - - -   O2SAT % - 93.5 85.1 99.2 98.8        Exercise Target Goals: Exercise Program Goal: Individual exercise prescription set using results from initial 6 min walk test and THRR while considering  patient's activity barriers and safety.   Exercise Prescription Goal: Initial exercise prescription builds to 30-45 minutes a day of aerobic activity, 2-3 days per week.  Home exercise guidelines will be given to patient during program as part of exercise prescription that the participant will acknowledge.   Education: Aerobic Exercise: - Group verbal and visual presentation on the components of exercise prescription. Introduces F.I.T.T  principle from ACSM for exercise prescriptions.  Reviews F.I.T.T. principles of aerobic exercise including progression. Written material given at graduation.   Education: Resistance Exercise: - Group verbal and visual presentation on the components of exercise prescription. Introduces F.I.T.T principle from ACSM for exercise prescriptions  Reviews F.I.T.T. principles of resistance exercise including progression. Written material given at graduation.    Education: Exercise & Equipment Safety: - Individual verbal instruction and demonstration of equipment use and safety with use of the equipment. Flowsheet Row Cardiac Rehab from 01/13/2021 in [facility] Cardiac and Pulmonary Rehab  Date 01/13/21  Educator AS  Instruction Review Code 1- Verbalizes Understanding       Education: Exercise Physiology & General Exercise Guidelines: - Group verbal and written instruction with models to review the exercise physiology of the cardiovascular system and associated critical values. Provides general exercise guidelines with specific guidelines to those with heart or lung disease.    Education: Flexibility, Balance, Mind/Body Relaxation: - Group verbal and visual presentation with interactive activity on the components of exercise  prescription. Introduces F.I.T.T principle from ACSM for exercise prescriptions. Reviews F.I.T.T. principles of flexibility and balance exercise training including progression. Also discusses the mind body connection.  Reviews various relaxation techniques to help reduce and manage stress (i.e. Deep breathing, progressive muscle relaxation, and visualization). Balance handout provided to take home. Written material given at graduation.   Activity Barriers & Risk Stratification:  Activity Barriers & Cardiac Risk Stratification - 12/08/20 1533       Activity Barriers & Cardiac Risk Stratification   Activity Barriers None    Cardiac Risk Stratification High             6 Minute Walk:  6 Minute Walk     Row Name 01/13/21 1544         6 Minute Walk   Phase Initial     Distance 1180 feet     Walk Time 6 minutes     # of Rest Breaks 0     MPH 2.2     METS 3.68     RPE 9     Perceived Dyspnea  1     VO2 Peak 12.9     Symptoms No     Resting HR 77 bpm     Resting BP 102/58     Resting Oxygen Saturation  94 %     Exercise Oxygen Saturation  during 6 min walk 94 %     Max Ex. HR 88 bpm     Max Ex. BP 118/72     2 Minute Post BP 102/66              Oxygen Initial Assessment:   Oxygen Re-Evaluation:   Oxygen Discharge (Final Oxygen Re-Evaluation):   Initial Exercise Prescription:  Initial Exercise Prescription - 01/13/21 1500       Date of Initial Exercise RX and Referring Provider   Date 01/13/21    Referring Provider Margaretha Glassing      Treadmill   MPH 2.2    Grade 1.5    Minutes 15    METs 3.2      REL-XR   Level 3    Speed 50    Minutes 15    METs 3.2      T5 Nustep   Level 2    SPM 80    Minutes 15    METs 3.2      Prescription Details   Frequency (times per week) 3  Duration Progress to 30 minutes of continuous aerobic without signs/symptoms of physical distress      Intensity   THRR 40-80% of Max Heartrate 116-156    Ratings of Perceived  Exertion 11-13    Perceived Dyspnea 0-4      Resistance Training   Training Prescription Yes    Weight 3 lb    Reps 10-15             Perform Capillary Blood Glucose checks as needed.  Exercise Prescription Changes:   Exercise Prescription Changes     Row Name 01/13/21 1500             Response to Exercise   Blood Pressure (Admit) 102/58       Blood Pressure (Exercise) 118/72       Blood Pressure (Exit) 102/66       Heart Rate (Admit) 77 bpm       Heart Rate (Exercise) 88 bpm       Heart Rate (Exit) 83 bpm       Oxygen Saturation (Admit) 94 %       Oxygen Saturation (Exercise) 94 %       Rating of Perceived Exertion (Exercise) 9       Perceived Dyspnea (Exercise) 1       Symptoms none                Exercise Comments:   Exercise Goals and Review:   Exercise Goals     Row Name 01/13/21 1551             Exercise Goals   Increase Physical Activity Yes       Intervention Provide advice, education, support and counseling about physical activity/exercise needs.;Develop an individualized exercise prescription for aerobic and resistive training based on initial evaluation findings, risk stratification, comorbidities and participant's personal goals.       Expected Outcomes Short Term: Attend rehab on a regular basis to increase amount of physical activity.;Long Term: Add in home exercise to make exercise part of routine and to increase amount of physical activity.;Long Term: Exercising regularly at least 3-5 days a week.       Increase Strength and Stamina Yes       Intervention Provide advice, education, support and counseling about physical activity/exercise needs.;Develop an individualized exercise prescription for aerobic and resistive training based on initial evaluation findings, risk stratification, comorbidities and participant's personal goals.       Expected Outcomes Short Term: Increase workloads from initial exercise prescription for resistance,  speed, and METs.;Short Term: Perform resistance training exercises routinely during rehab and add in resistance training at home;Long Term: Improve cardiorespiratory fitness, muscular endurance and strength as measured by increased METs and functional capacity ( )       Able to understand and use rate of perceived exertion (RPE) scale Yes       Intervention Provide education and explanation on how to use RPE scale       Expected Outcomes Short Term: Able to use RPE daily in rehab to express subjective intensity level;Long Term:  Able to use RPE to guide intensity level when exercising independently       Able to understand and use Dyspnea scale Yes       Intervention Provide education and explanation on how to use Dyspnea scale       Expected Outcomes Short Term: Able to use Dyspnea scale daily in rehab to express subjective sense of shortness of breath during  exertion;Long Term: Able to use Dyspnea scale to guide intensity level when exercising independently       Knowledge and understanding of Target Heart Rate Range (THRR) Yes       Intervention Provide education and explanation of THRR including how the numbers were predicted and where they are located for reference       Expected Outcomes Short Term: Able to state/look up THRR;Short Term: Able to use daily as guideline for intensity in rehab;Long Term: Able to use THRR to govern intensity when exercising independently       Able to check pulse independently Yes       Intervention Provide education and demonstration on how to check pulse in carotid and radial arteries.;Review the importance of being able to check your own pulse for safety during independent exercise       Expected Outcomes Short Term: Able to explain why pulse checking is important during independent exercise;Long Term: Able to check pulse independently and accurately       Understanding of Exercise Prescription Yes                Exercise Goals Re-Evaluation  :   Discharge Exercise Prescription (Final Exercise Prescription Changes):  Exercise Prescription Changes - 01/13/21 1500       Response to Exercise   Blood Pressure (Admit) 102/58    Blood Pressure (Exercise) 118/72    Blood Pressure (Exit) 102/66    Heart Rate (Admit) 77 bpm    Heart Rate (Exercise) 88 bpm    Heart Rate (Exit) 83 bpm    Oxygen Saturation (Admit) 94 %    Oxygen Saturation (Exercise) 94 %    Rating of Perceived Exertion (Exercise) 9    Perceived Dyspnea (Exercise) 1    Symptoms none             Nutrition:  Target Goals: Understanding of nutrition guidelines, daily intake of sodium 1500mg , cholesterol 200mg , calories 30% from fat and 7% or less from saturated fats, daily to have 5 or more servings of fruits and vegetables.  Education: All About Nutrition: -Group instruction provided by verbal, written material, interactive activities, discussions, models, and posters to present general guidelines for heart healthy nutrition including fat, fiber, MyPlate, the role of sodium in heart healthy nutrition, utilization of the nutrition label, and utilization of this knowledge for meal planning. Follow up email sent as well. Written material given at graduation.   Biometrics:  Pre Biometrics - 01/13/21 1551       Pre Biometrics   Height 6' 0.5" (1.842 m)    Weight 239 lb 3.2 oz (108.5 kg)    BMI (Calculated) 31.98    Single Leg Stand 30 seconds              Nutrition Therapy Plan and Nutrition Goals:   Nutrition Assessments:  MEDIFICTS Score Key: ?70 Need to make dietary changes  40-70 Heart Healthy Diet ? 40 Therapeutic Level Cholesterol Diet  Flowsheet Row Cardiac Rehab [facility]13/[facility] Cardiac and Pulmonary Rehab  Picture Your Plate Total Score on Admission 62      Picture Your Plate Scores: <40<40 Unhealthy dietary pattern with much room for improvement. 41-50 Dietary pattern unlikely to meet recommendations for good health and room  for improvement. 51-60 More healthful dietary pattern, with some room for improvement.  >60 Healthy dietary pattern, although there may be some specific behaviors that could be improved.    Nutrition Goals Re-Evaluation:   Nutrition  Goals Discharge (Final Nutrition Goals Re-Evaluation):   Psychosocial: Target Goals: Acknowledge presence or absence of significant depression and/or stress, maximize coping skills, provide positive support system. Participant is able to verbalize types and ability to use techniques and skills needed for reducing stress and depression.   Education: Stress, Anxiety, and Depression - Group verbal and visual presentation to define topics covered.  Reviews how body is impacted by stress, anxiety, and depression.  Also discusses healthy ways to reduce stress and to treat/manage anxiety and depression.  Written material given at graduation.   Education: Sleep Hygiene -Provides group verbal and written instruction about how sleep can affect your health.  Define sleep hygiene, discuss sleep cycles and impact of sleep habits. Review good sleep hygiene tips.    Initial Review & Psychosocial Screening:  Initial Psych Review & Screening - 12/08/20 1534       Initial Review   Current issues with Current Sleep Concerns;History of Depression;Current Depression;Current Stress Concerns;Current Psychotropic Meds    Source of Stress Concerns Unable to participate in former interests or hobbies;Unable to perform yard/household activities      Family Dynamics   Good Support System? Yes   fiance     Barriers   Psychosocial barriers to participate in program There are no identifiable barriers or psychosocial needs.      Screening Interventions   Interventions Encouraged to exercise;Provide feedback about the scores to participant;To provide support and resources with identified psychosocial needs    Expected Outcomes Short Term goal: Utilizing psychosocial counselor, staff  and physician to assist with identification of specific Stressors or current issues interfering with healing process. Setting desired goal for each stressor or current issue identified.;Long Term Goal: Stressors or current issues are controlled or eliminated.;Short Term goal: Identification and review with participant of any Quality of Life or Depression concerns found by scoring the questionnaire.;Long Term goal: The participant improves quality of Life and PHQ9 Scores as seen by post scores and/or verbalization of changes             Quality of Life Scores:   Quality of Life - 01/13/21 1554       Quality of Life   Select Quality of Life      Quality of Life Scores   Health/Function Pre 9.63 %    Socioeconomic Pre 17.43 %    Psych/Spiritual Pre 9.57 %    Family Pre 15.6 %    GLOBAL Pre 12.1 %            Scores of 19 and below usually indicate a poorer quality of life in these areas.  A difference of  2-3 points is a clinically meaningful difference.  A difference of 2-3 points in the total score of the Quality of Life Index has been associated with significant improvement in overall quality of life, self-image, physical symptoms, and general health in studies assessing change in quality of life.  PHQ-9: Recent Review Flowsheet Data     Depression screen [facility] 2/9 01/13/2021 02/18/2018 12/19/2017   Decreased Interest 2 2 2    Down, Depressed, Hopeless 3  2 2    PHQ - 2 Score 5 4 4    Altered sleeping 2 2 3    Tired, decreased energy 2 2 3    Change in appetite 1 3 2    Feeling bad or failure about yourself  3 2 2    Trouble concentrating 0 2 3   Moving slowly or fidgety/restless 1 2 2    Suicidal thoughts 3  0 1   PHQ-9 Score 17 17 20    Difficult doing work/chores Somewhat difficult Extremely dIfficult Somewhat difficult      Interpretation of Total Score  Total Score Depression Severity:  1-4 = Minimal depression, 5-9 = Mild depression, 10-14 = Moderate depression, 15-19 =  Moderately severe depression, 20-27 = Severe depression   Psychosocial Evaluation and Intervention:  Psychosocial Evaluation - 12/08/20 1549       Psychosocial Evaluation & Interventions   Interventions Encouraged to exercise with the program and follow exercise prescription;Stress management education;Relaxation education    Comments Adreyan is coming to cardiac rehab for Systolic HF. He has had issues with his heart since 2016 when he got an AVR. Since then, he has gotten a pacemaker, developed COPD, is being worked up for a possible ICD, and is also a diabetic. He is on treatment for his depression and reports it works "most of the time" and his fiance helps keep his mood more level by being attentive to his mood and talking things through. His biggest goal during this program is to lose weight as he feels this will really help his heart. His main motivation for maintaining a heart healthy lifestyle is his 17 year old daughter. He states his sleep is very irregular, most nights going to bed very late and sleeping late. He tries to do things around the house even though he doesn't want to and he notices his shortness of breath which leads to chest discomfort happens during a lot of physical activity. He has been trying to walk more but the summer heat has made that difficult.    Expected Outcomes Short: attend cardiac rehab for education and exercise. Long: develop and maintain positive self care habits.    Continue Psychosocial Services  Follow up required by staff             Psychosocial Re-Evaluation:   Psychosocial Discharge (Final Psychosocial Re-Evaluation):   Vocational Rehabilitation: Provide vocational rehab assistance to qualifying candidates.   Vocational Rehab Evaluation & Intervention:  Vocational Rehab - 12/08/20 1543       Initial Vocational Rehab Evaluation & Intervention   Assessment shows need for Vocational Rehabilitation No              Education: Education Goals: Education classes will be provided on a variety of topics geared toward better understanding of heart health and risk factor modification. Participant will state understanding/return demonstration of topics presented as noted by education test scores.  Learning Barriers/Preferences:  Learning Barriers/Preferences - 12/08/20 1542       Learning Barriers/Preferences   Learning Barriers None    Learning Preferences None             General Cardiac Education Topics:  AED/CPR: - Group verbal and written instruction with the use of models to demonstrate the basic use of the AED with the basic ABC's of resuscitation.   Anatomy and Cardiac Procedures: - Group verbal and visual presentation and models provide information about basic cardiac anatomy and function. Reviews the testing methods done to diagnose heart disease and the outcomes of the test results. Describes the treatment choices: Medical Management, Angioplasty, or Coronary Bypass Surgery for treating various heart conditions including Myocardial Infarction, Angina, Valve Disease, and Cardiac Arrhythmias.  Written material given at graduation.   Medication Safety: - Group verbal and visual instruction to review commonly prescribed medications for heart and lung disease. Reviews the medication, class of the drug, and side effects. Includes the steps  to properly store meds and maintain the prescription regimen.  Written material given at graduation.   Intimacy: - Group verbal instruction through game format to discuss how heart and lung disease can affect sexual intimacy. Written material given at graduation..   Know Your Numbers and Heart Failure: - Group verbal and visual instruction to discuss disease risk factors for cardiac and pulmonary disease and treatment options.  Reviews associated critical values for Overweight/Obesity, Hypertension, Cholesterol, and Diabetes.  Discusses basics of heart  failure: signs/symptoms and treatments.  Introduces Heart Failure Zone chart for action plan for heart failure.  Written material given at graduation.   Infection Prevention: - Provides verbal and written material to individual with discussion of infection control including proper hand washing and proper equipment cleaning during exercise session. Flowsheet Row Cardiac Rehab from 01/13/2021 in [facility] Cardiac and Pulmonary Rehab  Date 01/13/21  Educator AS  Instruction Review Code 1- Verbalizes Understanding       Falls Prevention: - Provides verbal and written material to individual with discussion of falls prevention and safety. Flowsheet Row Cardiac Rehab from 01/13/2021 in [facility] Cardiac and Pulmonary Rehab  Date 01/13/21  Educator AS  Instruction Review Code 1- Verbalizes Understanding       Other: -Provides group and verbal instruction on various topics (see comments)   Knowledge Questionnaire Score:   Core Components/Risk Factors/Patient Goals at Admission:  Personal Goals and Risk Factors at Admission - 01/13/21 1559       Core Components/Risk Factors/Patient Goals on Admission    Weight Management Yes;Weight Loss    Intervention Weight Management: Develop a combined nutrition and exercise program designed to reach desired caloric intake, while maintaining appropriate intake of nutrient and fiber, sodium and fats, and appropriate energy expenditure required for the weight goal.;Weight Management: Provide education and appropriate resources to help participant work on and attain dietary goals.;Weight Management/Obesity: Establish reasonable short term and long term weight goals.    Admit Weight 239 lb 3.2 oz (108.5 kg)    Goal Weight: Short Term 225 lb (102.1 kg)    Goal Weight: Long Term 200 lb (90.7 kg)    Expected Outcomes Short Term: Continue to assess and modify interventions until short term weight is achieved;Long Term: Adherence to nutrition and physical  activity/exercise program aimed toward attainment of established weight goal;Weight Loss: Understanding of general recommendations for a balanced deficit meal plan, which promotes 1-2 lb weight loss per week and includes a negative energy balance of [phone] kcal/d;Understanding of distribution of calorie intake throughout the day with the consumption of 4-5 meals/snacks;Understanding recommendations for meals to include 15-35% energy as protein, 25-35% energy from fat, 35-60% energy from carbohydrates, less than 200mg  of dietary cholesterol, 20-35 gm of total fiber daily    Improve shortness of breath with ADL's Yes    Intervention Provide education, individualized exercise plan and daily activity instruction to help decrease symptoms of SOB with activities of daily living.    Expected Outcomes Short Term: Improve cardiorespiratory fitness to achieve a reduction of symptoms when performing ADLs;Long Term: Be able to perform more ADLs without symptoms or delay the onset of symptoms    Diabetes Yes    Intervention Provide education about signs/symptoms and action to take for hypo/hyperglycemia.;Provide education about proper nutrition, including hydration, and aerobic/resistive exercise prescription along with prescribed medications to achieve blood glucose in normal ranges: Fasting glucose 65-99 mg/dL    Expected Outcomes Short Term: Participant verbalizes understanding of the signs/symptoms and immediate care of  hyper/hypoglycemia, proper foot care and importance of medication, aerobic/resistive exercise and nutrition plan for blood glucose control.;Long Term: Attainment of HbA1C < 7%.    Heart Failure Yes    Intervention Provide a combined exercise and nutrition program that is supplemented with education, support and counseling about heart failure. Directed toward relieving symptoms such as shortness of breath, decreased exercise tolerance, and extremity edema.    Expected Outcomes Improve functional  capacity of life;Short term: Attendance in program 2-3 days a week with increased exercise capacity. Reported lower sodium intake. Reported increased fruit and vegetable intake. Reports medication compliance.;Short term: Daily weights obtained and reported for increase. Utilizing diuretic protocols set by physician.;Long term: Adoption of self-care skills and reduction of barriers for early signs and symptoms recognition and intervention leading to self-care maintenance.             Education:Diabetes - Individual verbal and written instruction to review signs/symptoms of diabetes, desired ranges of glucose level fasting, after meals and with exercise. Acknowledge that pre and post exercise glucose checks will be done for 3 sessions at entry of program. Flowsheet Row Cardiac Rehab from 01/13/2021 in [facility]  Date 01/13/21  Educator AS  Instruction Review Code 1- Verbalizes Understanding       Core Components/Risk Factors/Patient Goals Review:    Core Components/Risk Factors/Patient Goals at Discharge (Final Review):    ITP Comments:  ITP Comments     Row Name 12/08/20 1555           ITP Comments Initial telephone orientation completed. Diagnosis can be found in [facility] 6/21. EP orientation scheduled for Thursday 7/21 at 2pm.                Comments: initial ITP

## 2021-01-17 ENCOUNTER — Other Ambulatory Visit: Payer: Self-pay

## 2021-01-17 ENCOUNTER — Emergency Department
Admission: EM | Admit: 2021-01-17 | Discharge: 2021-01-17 | Disposition: A | Discharge status: Home / Self Care | Payer: Medicare HMO | Attending: Student in an Organized Health Care Education/Training Program | Admitting: Student in an Organized Health Care Education/Training Program

## 2021-01-17 ENCOUNTER — Encounter: Payer: Self-pay | Admitting: Psychiatry

## 2021-01-17 ENCOUNTER — Inpatient Hospital Stay
Admission: AD | Admit: 2021-01-17 | Discharge: 2021-01-21 | DRG: 885 | Disposition: A | Discharge status: Home / Self Care | Payer: Medicare HMO | Source: Intra-hospital | Attending: Psychiatry | Admitting: Psychiatry

## 2021-01-17 DIAGNOSIS — E1169 Type 2 diabetes mellitus with other specified complication: Secondary | ICD-10-CM | POA: Diagnosis present

## 2021-01-17 DIAGNOSIS — K30 Functional dyspepsia: Secondary | ICD-10-CM | POA: Diagnosis present

## 2021-01-17 DIAGNOSIS — R45851 Suicidal ideations: Secondary | ICD-10-CM | POA: Diagnosis present

## 2021-01-17 DIAGNOSIS — I110 Hypertensive heart disease with heart failure: Secondary | ICD-10-CM | POA: Diagnosis present

## 2021-01-17 DIAGNOSIS — F419 Anxiety disorder, unspecified: Secondary | ICD-10-CM | POA: Diagnosis present

## 2021-01-17 DIAGNOSIS — I442 Atrioventricular block, complete: Secondary | ICD-10-CM | POA: Diagnosis present

## 2021-01-17 DIAGNOSIS — Z62810 Personal history of physical and sexual abuse in childhood: Secondary | ICD-10-CM | POA: Diagnosis present

## 2021-01-17 DIAGNOSIS — Z7951 Long term (current) use of inhaled steroids: Secondary | ICD-10-CM

## 2021-01-17 DIAGNOSIS — K219 Gastro-esophageal reflux disease without esophagitis: Secondary | ICD-10-CM | POA: Diagnosis present

## 2021-01-17 DIAGNOSIS — E119 Type 2 diabetes mellitus without complications: Secondary | ICD-10-CM | POA: Diagnosis not present

## 2021-01-17 DIAGNOSIS — I5032 Chronic diastolic (congestive) heart failure: Secondary | ICD-10-CM | POA: Diagnosis present

## 2021-01-17 DIAGNOSIS — Z888 Allergy status to other drugs, medicaments and biological substances status: Secondary | ICD-10-CM

## 2021-01-17 DIAGNOSIS — K5900 Constipation, unspecified: Secondary | ICD-10-CM | POA: Diagnosis present

## 2021-01-17 DIAGNOSIS — J4540 Moderate persistent asthma, uncomplicated: Secondary | ICD-10-CM | POA: Diagnosis present

## 2021-01-17 DIAGNOSIS — Z955 Presence of coronary angioplasty implant and graft: Secondary | ICD-10-CM | POA: Insufficient documentation

## 2021-01-17 DIAGNOSIS — Z87891 Personal history of nicotine dependence: Secondary | ICD-10-CM

## 2021-01-17 DIAGNOSIS — G4700 Insomnia, unspecified: Secondary | ICD-10-CM | POA: Diagnosis present

## 2021-01-17 DIAGNOSIS — Z952 Presence of prosthetic heart valve: Secondary | ICD-10-CM

## 2021-01-17 DIAGNOSIS — E785 Hyperlipidemia, unspecified: Secondary | ICD-10-CM | POA: Diagnosis present

## 2021-01-17 DIAGNOSIS — N529 Male erectile dysfunction, unspecified: Secondary | ICD-10-CM | POA: Diagnosis present

## 2021-01-17 DIAGNOSIS — Z79899 Other long term (current) drug therapy: Secondary | ICD-10-CM

## 2021-01-17 DIAGNOSIS — Z950 Presence of cardiac pacemaker: Secondary | ICD-10-CM | POA: Diagnosis not present

## 2021-01-17 DIAGNOSIS — Z7901 Long term (current) use of anticoagulants: Secondary | ICD-10-CM | POA: Insufficient documentation

## 2021-01-17 DIAGNOSIS — Z794 Long term (current) use of insulin: Secondary | ICD-10-CM | POA: Diagnosis not present

## 2021-01-17 DIAGNOSIS — F332 Major depressive disorder, recurrent severe without psychotic features: Secondary | ICD-10-CM | POA: Insufficient documentation

## 2021-01-17 DIAGNOSIS — Z7982 Long term (current) use of aspirin: Secondary | ICD-10-CM | POA: Insufficient documentation

## 2021-01-17 DIAGNOSIS — Z046 Encounter for general psychiatric examination, requested by authority: Secondary | ICD-10-CM | POA: Diagnosis present

## 2021-01-17 DIAGNOSIS — Z20822 Contact with and (suspected) exposure to covid-19: Secondary | ICD-10-CM | POA: Diagnosis not present

## 2021-01-17 DIAGNOSIS — R6889 Other general symptoms and signs: Secondary | ICD-10-CM | POA: Diagnosis present

## 2021-01-17 DIAGNOSIS — Z7984 Long term (current) use of oral hypoglycemic drugs: Secondary | ICD-10-CM | POA: Diagnosis not present

## 2021-01-17 LAB — URINE DRUG SCREEN, QUALITATIVE (ARMC ONLY)
Amphetamines, Ur Screen: NOT DETECTED
Barbiturates, Ur Screen: NOT DETECTED
Benzodiazepine, Ur Scrn: NOT DETECTED
Cannabinoid 50 Ng, Ur ~~LOC~~: NOT DETECTED
Cocaine Metabolite,Ur ~~LOC~~: NOT DETECTED
MDMA (Ecstasy)Ur Screen: NOT DETECTED
Methadone Scn, Ur: NOT DETECTED
Opiate, Ur Screen: NOT DETECTED
Phencyclidine (PCP) Ur S: NOT DETECTED
Tricyclic, Ur Screen: NOT DETECTED

## 2021-01-17 LAB — SALICYLATE LEVEL: Salicylate Lvl: <7 mg/dL — ABNORMAL LOW (ref 7.0–30.0)

## 2021-01-17 LAB — CBC
HCT: 46.1 % (ref 39.0–52.0)
Hemoglobin: 15.5 g/dL (ref 13.0–17.0)
MCH: 31.6 pg (ref 26.0–34.0)
MCHC: 33.6 g/dL (ref 30.0–36.0)
MCV: 93.9 fL (ref 80.0–100.0)
Platelets: 220 10*3/uL (ref 150–400)
RBC: 4.91 MIL/uL (ref 4.22–5.81)
RDW: 13.2 % (ref 11.5–15.5)
WBC: 11.5 10*3/uL — ABNORMAL HIGH (ref 4.0–10.5)
nRBC: 0 % (ref 0.0–0.2)

## 2021-01-17 LAB — COMPREHENSIVE METABOLIC PANEL
ALT: 22 U/L (ref 0–44)
AST: 24 U/L (ref 15–41)
Albumin: 4.9 g/dL (ref 3.5–5.0)
Alkaline Phosphatase: 83 U/L (ref 38–126)
Anion gap: 9 (ref 5–15)
BUN: 18 mg/dL (ref 6–20)
CO2: 33 mmol/L — ABNORMAL HIGH (ref 22–32)
Calcium: 9.8 mg/dL (ref 8.9–10.3)
Chloride: 97 mmol/L — ABNORMAL LOW (ref 98–111)
Creatinine, Ser: 1.11 mg/dL (ref 0.61–1.24)
GFR, Estimated: >60 mL/min (ref >60)
Glucose, Bld: 89 mg/dL (ref 70–99)
Potassium: 3.8 mmol/L (ref 3.5–5.1)
Sodium: 139 mmol/L (ref 135–145)
Total Bilirubin: 1.7 mg/dL — ABNORMAL HIGH (ref 0.3–1.2)
Total Protein: 8.1 g/dL (ref 6.5–8.1)

## 2021-01-17 LAB — RESP PANEL BY RT-PCR (FLU A&B, COVID) ARPGX2
Influenza A by PCR: NEGATIVE
Influenza B by PCR: NEGATIVE
SARS Coronavirus 2 by RT PCR: NEGATIVE

## 2021-01-17 LAB — ACETAMINOPHEN LEVEL: Acetaminophen (Tylenol), Serum: <10 ug/mL — ABNORMAL LOW (ref 10–30)

## 2021-01-17 LAB — ETHANOL: Alcohol, Ethyl (B): <10 mg/dL (ref <10)

## 2021-01-17 MED ORDER — ARFORMOTEROL TARTRATE 15 MCG/2ML IN NEBU
15.0000 ug | INHALATION_SOLUTION | Freq: Two times a day (BID) | RESPIRATORY_TRACT | Status: DC
  Administered 2021-01-19 – 2021-01-21 (×2): 15 ug via RESPIRATORY_TRACT
  Filled 2021-01-17 (×9): qty 2

## 2021-01-17 MED ORDER — SENNOSIDES-DOCUSATE SODIUM 8.6-50 MG PO TABS
2.0000 | ORAL_TABLET | Freq: Two times a day (BID) | ORAL | Status: DC
  Filled 2021-01-17 (×2): qty 2

## 2021-01-17 MED ORDER — DAPAGLIFLOZIN PROPANEDIOL 5 MG PO TABS
10.0000 mg | ORAL_TABLET | Freq: Every day | ORAL | Status: DC
  Administered 2021-01-18 – 2021-01-21 (×4): 10 mg via ORAL
  Filled 2021-01-17 (×5): qty 2

## 2021-01-17 MED ORDER — DILTIAZEM HCL ER COATED BEADS 120 MG PO CP24
120.0000 mg | ORAL_CAPSULE | Freq: Every day | ORAL | Status: DC
  Administered 2021-01-18 – 2021-01-21 (×4): 120 mg via ORAL
  Filled 2021-01-17 (×4): qty 1

## 2021-01-17 MED ORDER — IPRATROPIUM-ALBUTEROL 0.5-2.5 (3) MG/3ML IN SOLN: 3.0000 mL | Freq: Four times a day (QID) | RESPIRATORY_TRACT | Status: DC

## 2021-01-17 MED ORDER — POLYETHYLENE GLYCOL 3350 17 G PO PACK
17.0000 g | PACK | Freq: Every day | ORAL | Status: DC
  Filled 2021-01-17 (×2): qty 1

## 2021-01-17 MED ORDER — MAGNESIUM HYDROXIDE 400 MG/5ML PO SUSP: 30.0000 mL | Freq: Every day | ORAL | Status: DC | PRN

## 2021-01-17 MED ORDER — PANTOPRAZOLE SODIUM 40 MG PO PACK
40.0000 mg | PACK | Freq: Every day | ORAL | Status: DC
  Filled 2021-01-17: qty 20

## 2021-01-17 MED ORDER — HYDROXYZINE HCL 50 MG PO TABS
50.0000 mg | ORAL_TABLET | Freq: Four times a day (QID) | ORAL | Status: DC | PRN
  Administered 2021-01-17 – 2021-01-20 (×5): 50 mg via ORAL
  Filled 2021-01-17 (×5): qty 1

## 2021-01-17 MED ORDER — ACETAMINOPHEN 325 MG PO TABS
650.0000 mg | ORAL_TABLET | Freq: Four times a day (QID) | ORAL | Status: DC | PRN
  Administered 2021-01-20: 650 mg via ORAL
  Filled 2021-01-17: qty 2

## 2021-01-17 MED ORDER — UMECLIDINIUM BROMIDE 62.5 MCG/INH IN AEPB
1.0000 | INHALATION_SPRAY | Freq: Every day | RESPIRATORY_TRACT | Status: DC
  Administered 2021-01-19 – 2021-01-21 (×3): 1 via RESPIRATORY_TRACT
  Filled 2021-01-17: qty 7

## 2021-01-17 MED ORDER — IPRATROPIUM-ALBUTEROL 20-100 MCG/ACT IN AERS
1.0000 | INHALATION_SPRAY | Freq: Four times a day (QID) | RESPIRATORY_TRACT | Status: DC
  Administered 2021-01-18 – 2021-01-21 (×8): 1 via RESPIRATORY_TRACT
  Filled 2021-01-17: qty 4

## 2021-01-17 MED ORDER — ALUM & MAG HYDROXIDE-SIMETH 200-200-20 MG/5ML PO SUSP: 30.0000 mL | ORAL | Status: DC | PRN

## 2021-01-17 MED ORDER — BUPROPION HCL ER (SR) 150 MG PO TB12
400.0000 mg | ORAL_TABLET | Freq: Every day | ORAL | Status: DC
  Administered 2021-01-18: 400 mg via ORAL
  Filled 2021-01-17: qty 1

## 2021-01-17 MED ORDER — TRAZODONE HCL 100 MG PO TABS
100.0000 mg | ORAL_TABLET | Freq: Every evening | ORAL | Status: DC | PRN
  Administered 2021-01-17 – 2021-01-20 (×4): 100 mg via ORAL
  Filled 2021-01-17 (×4): qty 1

## 2021-01-17 MED ORDER — FUROSEMIDE 10 MG/ML IJ SOLN
20.0000 mg | Freq: Every day | INTRAMUSCULAR | Status: DC
  Filled 2021-01-17: qty 2

## 2021-01-17 MED ORDER — SPIRONOLACTONE 25 MG PO TABS
25.0000 mg | ORAL_TABLET | Freq: Every day | ORAL | Status: DC
  Administered 2021-01-18 – 2021-01-21 (×4): 25 mg via ORAL
  Filled 2021-01-17 (×4): qty 1

## 2021-01-17 MED ORDER — ARTIFICIAL TEARS OPHTHALMIC OINT
1.0000 "application " | TOPICAL_OINTMENT | Freq: Three times a day (TID) | OPHTHALMIC | Status: DC
  Administered 2021-01-19: 1 via OPHTHALMIC
  Filled 2021-01-17: qty 1

## 2021-01-17 MED ORDER — ASPIRIN 81 MG PO CHEW
81.0000 mg | CHEWABLE_TABLET | Freq: Every day | ORAL | Status: DC
  Administered 2021-01-18: 81 mg
  Filled 2021-01-17: qty 1

## 2021-01-17 NOTE — BH Assessment (Signed)
Comprehensive Clinical Assessment (CCA) Note  01/17/2021 Nicholas Horton [id number]  Casimer Bilis, 45 year old male who presents to [facility] ED involuntarily for treatment. Per triage note, Pt arrived to ed via pov with friend. Seen [facility] today and sent to ed due to SI. Pt told [facility] that he was going to cut open incision from recent heart surgery to bleed out. Pt calm and cooperative in triage. Pt reports SI thought this past Thursday. Denies active SI thoughts at this time. NAD noted at this time.    During TTS assessment pt presents alert and oriented x 4, anxious but cooperative, and mood-congruent with affect. The pt does not appear to be responding to internal or external stimuli. Neither is the pt presenting with any delusional thinking. Pt verified the information provided to triage RN.   Pt identifies his main complaint to be that he is depressed, and these feelings have worsened over the past week. Patient reports he is having suicidal thoughts with a plan to tear open his suture where he has a pacemaker. Patient reports he is currently engaged; however, he and his fiance are going through a situation which is the recent cause of his anxiety and negative thoughts. Patient states he is not sleeping or eating. Pt denies using any illicit substances and alcohol. Patient reports he is compliant with his medications and takes them as prescribed. Pt reports INPT hx years ago and going to [facility] where he receives his psych meds. Pt denies HI. Patient reports having auditory and visual hallucinations. " I hear voices but I can't make out what they are saying."    Per Dr. Toni Amend pt is recommended for inpatient psychiatric admission.    Chief Complaint:  Chief Complaint  Patient presents with   Psychiatric Evaluation   Visit Diagnosis: Severe recurrent major depression without psychotic features    CCA Screening, Triage and Referral (STR)  Patient Reported Information How did  you hear about [place]? Self  Referral name: No data recorded Referral phone number: No data recorded  Whom do you see for routine medical problems? No data recorded Practice/Facility Name: No data recorded Practice/Facility Phone Number: No data recorded Name of Contact: No data recorded Contact Number: No data recorded Contact Fax Number: No data recorded Prescriber Name: No data recorded Prescriber Address (if known): No data recorded  What Is the Reason for Your Visit/Call Today? Patient came to the ED for having feelings of depression and anxiety.  How Long Has This Been Causing You Problems? <Week  What Do You Feel Would Help You the Most Today? Treatment for Depression or other mood problem   Have You Recently Been in Any Inpatient Treatment (Hospital/Detox/Crisis Center/28-Day Program)? No data recorded Name/Location of Program/Hospital:No data recorded How Long Were You There? No data recorded When Were You Discharged? No data recorded  Have You Ever Received Services From [facility] Before? No data recorded Who Do You See at [facility]? No data recorded  Have You Recently Had Any Thoughts About Hurting Yourself? Yes  Are You Planning to Commit Suicide/Harm Yourself At This time? No   Have you Recently Had Thoughts About Hurting Someone Karolee Ohs? No  Explanation: No data recorded  Have You Used Any Alcohol or Drugs in the Past 24 Hours? No  How Long Ago Did You Use Drugs or Alcohol? No data recorded What Did You Use and How Much? No data recorded  Do You Currently Have a Therapist/Psychiatrist? No  Name of Therapist/Psychiatrist:  No data recorded  Have You Been Recently Discharged From Any Office Practice or Programs? No  Explanation of Discharge From Practice/Program: No data recorded    CCA Screening Triage Referral Assessment Type of Contact: Face-to-Face  Is this Initial or Reassessment? No data recorded Date Telepsych consult ordered in [facility]:  No data  recorded Time Telepsych consult ordered in [facility]:  No data recorded  Patient Reported Information Reviewed? No data recorded Patient Left Without Being Seen? No data recorded Reason for Not Completing Assessment: No data recorded  Collateral Involvement: None provided   Does Patient Have a Court Appointed Legal Guardian? No data recorded Name and Contact of Legal Guardian: No data recorded If Minor and Not Living with Parent(s), Who has Custody? n/a  Is CPS involved or ever been involved? Never  Is APS involved or ever been involved? Never   Patient Determined To Be At Risk for Harm To Self or Others Based on Review of Patient Reported Information or Presenting Complaint? No  Method: No data recorded Availability of Means: No data recorded Intent: No data recorded Notification Required: No data recorded Additional Information for Danger to Others Potential: No data recorded Additional Comments for Danger to Others Potential: No data recorded Are There Guns or Other Weapons in Your Home? No data recorded Types of Guns/Weapons: No data recorded Are These Weapons Safely Secured?                            No data recorded Who Could Verify You Are Able To Have These Secured: No data recorded Do You Have any Outstanding Charges, Pending Court Dates, Parole/Probation? No data recorded Contacted To Inform of Risk of Harm To Self or Others: No data recorded  Location of Assessment: [facility] ED   Does Patient Present under Involuntary Commitment? Yes  IVC Papers Initial File Date: 01/17/21   [place] of Residence: [place]   Patient Currently Receiving the Following Services: Medication Management   Determination of Need: Urgent (48 hours)   Options For Referral: Inpatient Hospitalization; Medication Management     CCA Biopsychosocial Intake/Chief Complaint:  No data recorded Current Symptoms/Problems: No data recorded  Patient Reported Schizophrenia/Schizoaffective  Diagnosis in Past: No   Strengths: Communication  Preferences: No data recorded Abilities: No data recorded  Type of Services Patient Feels are Needed: No data recorded  Initial Clinical Notes/Concerns: No data recorded  Mental Health Symptoms Depression:   Change in energy/activity; Sleep (too much or little); Increase/decrease in appetite   Duration of Depressive symptoms:  Less than two weeks   Mania:   None   Anxiety:    Sleep; Difficulty concentrating   Psychosis:   None   Duration of Psychotic symptoms: No data recorded  Trauma:   None   Obsessions:   None   Compulsions:   None   Inattention:   None   Hyperactivity/Impulsivity:   None   Oppositional/Defiant Behaviors:   None   Emotional Irregularity:   Chronic feelings of emptiness; Intense/unstable relationships; Potentially harmful impulsivity   Other Mood/Personality Symptoms:  No data recorded   Mental Status Exam Appearance and self-care  Stature:   Tall   Weight:   Average weight   Clothing:   Casual   Grooming:   Normal   Cosmetic use:   None   Posture/gait:   Slumped   Motor activity:   Not Remarkable   Sensorium  Attention:   Normal   Concentration:  Anxiety interferes   Orientation:   X5   Recall/memory:   Normal   Affect and Mood  Affect:   Depressed; Anxious   Mood:   Depressed; Anxious; Worthless; Hopeless   Relating  Eye contact:   Fleeting   Facial expression:   Depressed; Sad   Attitude toward examiner:   Cooperative   Thought and Language  Speech flow:  Clear and Coherent   Thought content:   Appropriate to Mood and Circumstances   Preoccupation:   None   Hallucinations:   Auditory; Visual   Organization:  No data recorded  [employer] of Knowledge:   Average   Intelligence:   Average   Abstraction:   Functional   Judgement:   Poor   Reality Testing:   Distorted   Insight:   Lacking   Decision  Making:   Impulsive   Social Functioning  Social Maturity:   Impulsive   Social Judgement:   Victimized   Stress  Stressors:   Family conflict   Coping Ability:   Overwhelmed   Skill Deficits:   Decision making; Interpersonal   Supports:   Support needed     Religion:    Leisure/Recreation:    Exercise/Diet: Exercise/Diet Do You Follow a Special Diet?: No Do You Have Any Trouble Sleeping?: Yes Explanation of Sleeping Difficulties: Patient reports having poor sleep   CCA Employment/Education Employment/Work Situation:    Education:     CCA Family/Childhood History Family and Relationship History: Family history Marital status:  (Engaged)  Childhood History:     Child/Adolescent Assessment:     CCA Substance Use Alcohol/Drug Use: Alcohol / Drug Use Pain Medications: See PTA Prescriptions: See PTA Over the Counter: See PTA History of alcohol / drug use?: No history of alcohol / drug abuse                         ASAM's:  Six Dimensions of Multidimensional Assessment  Dimension 1:  Acute Intoxication and/or Withdrawal Potential:      Dimension 2:  Biomedical Conditions and Complications:      Dimension 3:  Emotional, Behavioral, or Cognitive Conditions and Complications:     Dimension 4:  Readiness to Change:     Dimension 5:  Relapse, Continued use, or Continued Problem Potential:     Dimension 6:  Recovery/Living Environment:     ASAM Severity Score:    ASAM Recommended Level of Treatment:     Substance use Disorder (SUD)    Recommendations for Services/Supports/Treatments:    DSM5 Diagnoses: Patient Active Problem List   Diagnosis Date Noted   Severe recurrent major depression without psychotic features (HCC) 01/17/2021   Acute respiratory failure with hypoxemia (HCC) 03/20/2018   ADHD (attention deficit hyperactivity disorder) 05/14/2017   Type 2 diabetes mellitus with other specified complication (HCC) 05/14/2017    Schizoaffective disorder (HCC) 05/14/2017   Moderate persistent asthma 01/12/2017   OCD (obsessive compulsive disorder) 01/12/2017   Obstructive sleep apnea 01/12/2017   Major depressive disorder, recurrent (HCC) 09/29/2016   Warfarin anticoagulation 09/29/2016   CHF (congestive heart failure) (HCC) 09/29/2016   Dyslipidemia 07/13/2016   Obesity (BMI 30.0-34.9) 07/07/2016   Heart palpitations 07/07/2015   Chronic diastolic heart failure (HCC) 04/14/2015   Encounter for therapeutic drug monitoring 12/16/2014   S/P AVR 12/01/2014   Complete heart block (HCC) 11/23/2014   Syncope 11/21/2014   Paradentosis 11/21/2014   Cardiac pacemaker in situ 11/12/2014  Tobacco use disorder 10/07/2014   History of non-ST elevation myocardial infarction (NSTEMI) 10/04/2014   Dyspnea on exertion 10/04/2014   Moderate to severe AS with bicuspid valve. Mean 33, peak 61 mmHg. 10/04/2014    Patient Centered Plan: Patient is on the following Treatment Plan(s):  Anxiety and Depression   Referrals to Alternative Service(s): Referred to Alternative Service(s):   Place:   Date:   Time:    Referred to Alternative Service(s):   Place:   Date:   Time:    Referred to Alternative Service(s):   Place:   Date:   Time:    Referred to Alternative Service(s):   Place:   Date:   Time:     Declin Rajan Dierdre Searles, Counselor, LCAS-A

## 2021-01-17 NOTE — BH Assessment (Signed)
Patient is to be admitted to [facility] BMU tonight 01/17/21 after 7:30pm pending negative Covid test by Dr. Toni Amend.  Attending Physician will be Dr. Neale Burly.   Patient has been assigned to room 320, by [facility] Charge Nurse, Maryelizabeth Kaufmann.     ER staff is aware of the admission: Inetta Fermo, ER Secretary   Dr. Roxan Hockey, ER MD  [name], Patient's Nurse  Sue Lush, Patient Access.

## 2021-01-17 NOTE — Plan of Care (Signed)
Patient new to the unit tonight, hasn't had time to progress  Problem: Education: Goal: Knowledge of [facility] Education information/materials will improve Outcome: Not Progressing Goal: Emotional status will improve Outcome: Not Progressing Goal: Mental status will improve Outcome: Not Progressing Goal: Verbalization of understanding the information provided will improve Outcome: Not Progressing   Problem: Safety: Goal: Periods of time without injury will increase Outcome: Not Progressing   Problem: Education: Goal: Ability to make informed decisions regarding treatment will improve Outcome: Not Progressing   Problem: Medication: Goal: Compliance with prescribed medication regimen will improve Outcome: Not Progressing   

## 2021-01-17 NOTE — ED Triage Notes (Addendum)
Pt arrived to ed via pov with friend. Seen [facility] today and sent to ed due to SI. Pt told [facility] that he was going to cut open incision from recent heart surgery to bleed out. Pt calm and cooperative in triage. Pt reports SI thought this past Thursday. Denies active SI thoughts at this time. NAD noted at this time

## 2021-01-17 NOTE — Progress Notes (Signed)
Patient admitted from [facility] - ED, report received from [facility], RN. Patient calm and cooperative during assessment. Pt endorses anxiety 10/10 and depression 8/10. Pt stated that this started because he made a romantic pass at his Kathrin Penner daughter on Thursday. Realizing it was a mistake and was going to cause problems with his Fiance patient became anxious, depressed and suicidal. Pt stated his plan was to open his incision from where he recently had a pacemaker put in and bleed out. Patient endorses passive SI but verbally contracts for safety with this Clinical research associate. Patient oriented to the unit and his room. Pt given snack. Pt compliant with medication administration per MD orders. Pt skin assessment completed with Guido Sander, RN. Pt has a scar on his chest from recent surgery to put a pacemaker in. No other abnormalities found. No contraband found on patient or in his belongings. Patient given education, support, and encouragement to be active in his treatment plan. Pt being monitored Q 15 minutes for safety per unit protocol. Pt remains safe on the unit.

## 2021-01-17 NOTE — BH Assessment (Signed)
Patient is to be admitted to [facility] by Dr. Toni Amend.  Attending Physician will be Dr. Neale Burly.   Patient has been assigned to room 320, by [facility] Charge Nurse [facility].   Intake Paper Work has been signed and placed on patient chart.  ER staff is aware of the admission: Bonita Quin, ER Secretary   Dr. Katrinka Blazing, ER MD  Lacinda Axon, Patient's Nurse  Rosey Bath, Patient Access.

## 2021-01-17 NOTE — ED Notes (Signed)
IVC, consult complete. Patient being moved to BMU after 7:30pm room #320

## 2021-01-17 NOTE — Consult Note (Signed)
[facility]   Reason for Consult: Consult for 45 year old man came to the emergency room voicing suicidal ideation and depression Referring Physician: Roxan Hockeyobinson Patient Identification: Nicholas BilisRichard Haymaker MRN:  [id number] Principal Diagnosis: Severe recurrent major depression without psychotic features (HCC) Diagnosis:  Principal Problem:   Severe recurrent major depression without psychotic features (HCC) Active Problems:   Complete heart block (HCC)   Chronic diastolic heart failure (HCC)   Cardiac pacemaker in situ   Total Time spent with patient: 1 hour  Subjective:   Nicholas Horton is a 45 y.o. male patient admitted with "I am thinking about killing myself".  HPI: Patient seen chart reviewed.  45 year old man presents with complaints of severe depression.  He reports that he had had chronic depression and had been feeling down a little worse than usual but last Thursday he had a situation arise with his behavior regarding his fiance and her family and ever since then has had suicidal thoughts.  It has caused a possibly permanent rift between him and his fiance.  He describes poor sleep and poor appetite anxious thoughts and thoughts of worthlessness and hopelessness.  Specific plan was to tear open the suture area where he had recently had a pacemaker implanted.  Patient says he is compliant with his prescribed antidepressant medication.  Denies alcohol or drug use.  Presents as cooperative.  Says that sometimes he will "see shadows" or hear sounds that he cannot make out but does not have any other psychotic symptoms.  Past Psychiatric History: Denies past suicide attempts but has had previous hospitalizations years ago.  Currently going to [facility] but just for medication not seeing a therapist.  Medication bupropion and Abilify.  Risk to Self:   Risk to Others:   Prior Inpatient Therapy:   Prior Outpatient Therapy:    Past Medical History:  Past Medical  History:  Diagnosis Date   CHF (congestive heart failure) (HCC)    Congenital bicuspid aortic valve 09/2014   - s/p AVR   Nonischemic cardiomyopathy (HCC) -- Resolved[I42.9] 09/2014   EF by Echo 20-25% (pre-op AVR) --> Echo 10/2014 & 03/2015: EF 50-55% - also Recent Normal Diastolic parameters    S/P AVR (aortic valve replacement) 10/2014   21 mm Carbometrics Mechanical Valve -- Epicardial PPM Leads   Severe aortic stenosis 10/04/2014   Presented with Syncope & CHF    Past Surgical History:  Procedure Laterality Date   AORTIC VALVE REPLACEMENT N/A 12/01/2014   Procedure: AORTIC VALVE REPLACEMENT (AVR);  Surgeon: Kerin PernaPeter Van Trigt, MD;  [facility];  Service: Open Heart Surgery;  Laterality: N/A;   CARDIAC CATHETERIZATION N/A 10/04/2014   Procedure: Left Heart Cath and Coronary Angiography;  Surgeon: Marykay Lexavid W Harding, MD;  [facility];  Service: Cardiovascular;  Laterality: N/A;   CARDIAC CATHETERIZATION  10/04/2014   Procedure: Temporary Pacemaker;  Surgeon: Marykay Lexavid W Harding, MD;  [facility];  Service: Cardiovascular;;   CARDIAC CATHETERIZATION N/A 11/25/2014   Procedure: Temporary Pacemaker;  Surgeon: Marinus MawGregg W Taylor, MD;  [facility];  Service: Cardiovascular;  Laterality: N/A;   MULTIPLE EXTRACTIONS WITH ALVEOLOPLASTY N/A 11/25/2014   Procedure: Extraction of tooth #'s 1,2,3,4,5,6,7,9, 10, 11, 12,13,14,16, 19, 20, 21, 22, 23, 24, 25, 26, 27, 28, 29, 30, 31 with alveoloplasty;  Surgeon: Charlynne Panderonald F Kulinski, DDS;  Location: [facility] OR;  Service: Oral Surgery;  Laterality: N/A;   SURGERY SCROTAL / TESTICULAR     for undescended testicle  as a child   TEE WITHOUT CARDIOVERSION N/A 12/01/2014   Procedure: TRANSESOPHAGEAL ECHOCARDIOGRAM (TEE);  Surgeon: Kerin Perna, MD;  Location[facility];  Service: Open Heart Surgery;  Laterality: N/A;   TRANSTHORACIC ECHOCARDIOGRAM  03/2015   Mildly dilated LV. EF 50 at 55%. Normal diastolic parameters. Mild to moderate  stenosis of prosthetic aortic valve. Mild to moderate regurgitation   TRANSTHORACIC ECHOCARDIOGRAM  08/2017   Over-read - EF ~50% with PPM dyssynchrony.  Mild AS.  mean gradient 24 mmHg.    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  Tobacco Use   Smoking status: Former    Packs/day: 2.00    Years: 20.00    Pack years: 40.00    Types: Cigarettes   Smokeless tobacco: Former   Tobacco comments:    quit 10/04/14  Substance and Sexual Activity   Alcohol use: No    Alcohol/week: 0.0 standard drinks    Comment: rarely   Drug use: No   Sexual activity: Yes  Other Topics Concern   Not on file  Social History Narrative   Lives in [place], [place] with wife and daughter.    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    Social Determinants of Health   Financial Resource Strain: Not on file  Food Insecurity: Not on file  Transportation Needs: Not on file  Physical Activity: Not on file  Stress: Not on file  Social Connections: Not on file   Additional Social History:    Allergies:   Allergies  Allergen Reactions   Other     No NSAIDS r/t blood thinners    Labs:  Results for orders placed or performed during the hospital encounter of 01/17/21 (from the past 48 hour(s))  Comprehensive metabolic panel     Status: Abnormal   Collection Time: 01/17/21  2:07 PM  Result Value Ref  Range   Sodium 139 135 - 145 mmol/L   Potassium 3.8 3.5 - 5.1 mmol/L   Chloride 97 (L) 98 - 111 mmol/L   CO2 33 (H) 22 - 32 mmol/L   Glucose, Bld 89 70 - 99 mg/dL    Comment: Glucose reference range applies only to samples taken after fasting for at least 8 hours.   BUN 18 6 - 20 mg/dL   Creatinine, Ser 2.69 0.61 - 1.24 mg/dL   Calcium 9.8 8.9 - 48.5 mg/dL   Total Protein 8.1 6.5 - 8.1 g/dL   Albumin 4.9 3.5 - 5.0 g/dL   AST 24 15 - 41 U/L   ALT 22 0 - 44 U/L   Alkaline Phosphatase 83 38 - 126 U/L   Total Bilirubin 1.7 (H) 0.3 - 1.2 mg/dL   GFR, Estimated >46 >27 mL/min    Comment: (NOTE) Calculated  using the CKD-EPI Creatinine Equation (2021)    Anion gap 9 5 - 15    Comment: Performed at [facility], [address] [place], [place] [address]  Ethanol     Status: None   Collection Time: 01/17/21  2:07 PM  Result Value Ref Range   Alcohol, Ethyl (B) <10 <10 mg/dL    Comment: (NOTE) Lowest detectable limit for serum alcohol is 10 mg/dL.  For medical purposes only. Performed at [facility], [address] [place], [place] [address]   Salicylate level     Status: Abnormal   Collection Time: 01/17/21  2:07 PM  Result Value Ref Range   Salicylate Lvl <7.0 (L) 7.0 - 30.0 mg/dL    Comment: Performed at [facility], [address] [place], [place] [address]  Acetaminophen level     Status: Abnormal   Collection Time: 01/17/21  2:07 PM  Result Value Ref Range   Acetaminophen (Tylenol), Serum <10 (L) 10 - 30 ug/mL    Comment: (NOTE) Therapeutic concentrations vary significantly. A range of 10-30 ug/mL  may be an effective concentration for many patients. However, some  are best treated at concentrations outside of this range. Acetaminophen concentrations >150 ug/mL at 4 hours after ingestion  and >50 ug/mL at 12 hours after ingestion are often associated with  toxic reactions.  Performed at [facility], [address]  [place], [place] [address]   cbc     Status: Abnormal   Collection Time: 01/17/21  2:07 PM  Result Value Ref Range   WBC 11.5 (H) 4.0 - 10.5 K/uL   RBC 4.91 4.22 - 5.81 MIL/uL   Hemoglobin 15.5 13.0 - 17.0 g/dL   HCT 19.3 79.0 - 24.0 %   MCV 93.9 80.0 - 100.0 fL   MCH 31.6 26.0 - 34.0 pg   MCHC 33.6 30.0 - 36.0 g/dL   RDW 97.3 53.2 - 99.2 %   Platelets 220 150 - 400 K/uL   nRBC 0.0 0.0 - 0.2 %    Comment: Performed at [facility], [address], [place], [place] [address]  Urine Drug Screen, Qualitative     Status: None   Collection Time: 01/17/21  2:07 PM  Result Value Ref Range   Tricyclic, Ur Screen NONE DETECTED NONE DETECTED   Amphetamines, Ur Screen NONE DETECTED NONE DETECTED   MDMA (Ecstasy)Ur Screen NONE DETECTED NONE DETECTED   Cocaine Metabolite,Ur [place] NONE DETECTED NONE DETECTED   Opiate, Ur Screen NONE DETECTED NONE DETECTED   Phencyclidine (PCP) Ur S NONE DETECTED NONE DETECTED   Cannabinoid 50 Ng, Ur [place] NONE DETECTED NONE DETECTED   Barbiturates, Ur Screen NONE DETECTED NONE DETECTED   Benzodiazepine, Ur Scrn NONE DETECTED NONE DETECTED   Methadone Scn, Ur NONE DETECTED NONE DETECTED    Comment: (NOTE) Tricyclics + metabolites, urine    Cutoff 1000 ng/mL Amphetamines + metabolites, urine  Cutoff 1000 ng/mL MDMA (Ecstasy), urine              Cutoff 500 ng/mL Cocaine Metabolite, urine          Cutoff 300 ng/mL Opiate + metabolites, urine        Cutoff 300 ng/mL Phencyclidine (PCP), urine         Cutoff 25 ng/mL Cannabinoid, urine                 Cutoff 50 ng/mL Barbiturates + metabolites, urine  Cutoff 200 ng/mL Benzodiazepine, urine  Cutoff 200 ng/mL Methadone, urine                   Cutoff 300 ng/mL  The urine drug screen provides only a preliminary, unconfirmed analytical test result and should not be used for non-medical purposes. Clinical consideration and professional judgment should be applied to any positive drug screen result due to  possible interfering substances. A more specific alternate chemical method must be used in order to obtain a confirmed analytical result. Gas chromatography / mass spectrometry (GC/MS) is the preferred confirm atory method. Performed at [facility], [address] [place], [place] [address]     No current facility-administered medications for this encounter.   Current Outpatient Medications  Medication Sig Dispense Refill   albuterol (VENTOLIN HFA) 108 (90 Base) MCG/ACT inhaler Inhale into the lungs.     ARIPiprazole (ABILIFY) 5 MG tablet Take by mouth.     artificial tears (LACRILUBE) OINT ophthalmic ointment Place 1 application into both eyes every 8 (eight) hours. (Patient not taking: Reported on 12/08/2020) 1 Tube 0   aspirin 81 MG chewable tablet Place 1 tablet (81 mg total) into feeding tube daily. (Patient not taking: Reported on 12/08/2020) 1 tablet 0   buPROPion (WELLBUTRIN SR) 200 MG 12 hr tablet Take 2 tablets by mouth daily.     cisatracurium 200 mg in sodium chloride 0.9 % 180 mL Inject 339.6-1,132 mcg/min into the vein continuous. 180 mL 0   cyclobenzaprine (FLEXERIL) 5 MG tablet One tab @ HS for back strain/spasm.     dapagliflozin propanediol (FARXIGA) 10 MG TABS tablet Take 1 tablet by mouth daily.     dextrose 10 % infusion Inject 20 mL/hr into the vein continuous. 500 mL 0   diltiazem (CARDIZEM CD) 120 MG 24 hr capsule Take 1 capsule (120 mg total) by mouth daily. OFFICE VISIT NEEDED (Patient not taking: Reported on 12/08/2020) 45 capsule 0   escitalopram (LEXAPRO) 10 MG tablet Take by mouth.     fentaNYL 10 mcg/ml SOLN infusion Inject 100-300 mcg/hr into the vein continuous. 250 mL 0   Fluticasone Propionate, Inhal, (FLOVENT DISKUS) 100 MCG/BLIST AEPB Inhale into the lungs.     furosemide (LASIX) 10 MG/ML injection Inject 2 mLs (20 mg total) into the vein daily. 4 mL 0   glucose blood (ONETOUCH ULTRA) test strip USE AS INSTRUCTED UP TO 3 TIMES DAILY.      Glycopyrrolate-Formoterol (BEVESPI AEROSPHERE) 9-4.8 MCG/ACT AERO INHALE 2 PUFFS INTO THE LUNGS TWICE A DAY     heparin 100-0.45 UNIT/ML-% infusion Inject 1,800 Units/hr into the vein continuous. 250 mL 0   insulin regular, human (MYXREDLIN) 100 units/ 100 mL infusion Inject 2 Units/hr into the vein continuous. (Patient not taking: Reported on 12/08/2020) 100 mL 0   ipratropium-albuterol (DUONEB) 0.5-2.5 (3) MG/3ML SOLN Take 3 mLs by nebulization every 6 (six) hours. (Patient not taking: Reported on 12/08/2020) 360 mL 0   methylPREDNISolone sodium succinate (SOLU-MEDROL) 40 mg/mL injection Inject 1 mL (40 mg total) into the vein 2 (two) times daily. (Patient not taking: Reported on 12/08/2020) 1 each 0   metoprolol succinate (TOPROL-XL) 25 MG 24 hr tablet Take 1 tablet by mouth daily.     midazolam 50 mg in sodium chloride 0.9 % 40 mL Inject 0-8 mg/hr into the vein continuous. 40 mL 0   pantoprazole sodium (PROTONIX) 40 mg/20 mL PACK Place 20 mLs (40 mg total) into feeding tube daily. (Patient not taking: Reported on 12/08/2020) 30 each  0   piperacillin-tazobactam (ZOSYN) 3.375 GM/50ML IVPB Inject 50 mLs (3.375 g total) into the vein every 8 (eight) hours. 50 mL 0   polyethylene glycol (MIRALAX / GLYCOLAX) packet Place 17 g into feeding tube daily. (Patient not taking: Reported on 12/08/2020) 14 each 0   potassium chloride (KLOR-CON) 10 MEQ tablet Take by mouth.     sacubitril-valsartan (ENTRESTO) 24-26 MG Take 1 tablet by mouth 2 (two) times daily.     senna-docusate (SENOKOT-S) 8.6-50 MG tablet Place 2 tablets into feeding tube 2 (two) times daily. (Patient not taking: Reported on 12/08/2020) 2 tablet 0   sodium bicarbonate 150 mEq in sterile water 850 mL Inject 25 mL/hr into the vein continuous. 850 mL 0   spironolactone (ALDACTONE) 25 MG tablet Take 1 tablet by mouth daily.     tadalafil (CIALIS) 20 MG tablet Take by mouth.     testosterone cypionate (DEPOTESTOSTERONE CYPIONATE) 200 MG/ML injection  Inject into the muscle.     torsemide (DEMADEX) 20 MG tablet      warfarin (COUMADIN) 5 MG tablet       Musculoskeletal: Strength & Muscle Tone: within normal limits Gait & Station: normal Patient leans: N/A            Psychiatric Specialty Exam:  Presentation  General Appearance:  No data recorded Eye Contact: No data recorded Speech: No data recorded Speech Volume: No data recorded Handedness: No data recorded  Mood and Affect  Mood: No data recorded Affect: No data recorded  Thought Process  Thought Processes: No data recorded Descriptions of Associations:No data recorded Orientation:No data recorded Thought Content:No data recorded History of Schizophrenia/Schizoaffective disorder:No data recorded Duration of Psychotic Symptoms:No data recorded Hallucinations:No data recorded Ideas of Reference:No data recorded Suicidal Thoughts:No data recorded Homicidal Thoughts:No data recorded  Sensorium  Memory: No data recorded Judgment: No data recorded Insight: No data recorded  Executive Functions  Concentration: No data recorded Attention Span: No data recorded Recall: No data recorded Fund of Knowledge: No data recorded Language: No data recorded  Psychomotor Activity  Psychomotor Activity: No data recorded  Assets  Assets: No data recorded  Sleep  Sleep: No data recorded  Physical Exam: Physical Exam Vitals and nursing note reviewed.  Constitutional:      Appearance: Normal appearance.  HENT:     Head: Normocephalic and atraumatic.     Mouth/Throat:     Pharynx: Oropharynx is clear.  Eyes:     Pupils: Pupils are equal, round, and reactive to light.  Cardiovascular:     Rate and Rhythm: Normal rate and regular rhythm.  Pulmonary:     Effort: Pulmonary effort is normal.     Breath sounds: Normal breath sounds.  Abdominal:     General: Abdomen is flat.     Palpations: Abdomen is soft.  Musculoskeletal:         General: Normal range of motion.  Skin:    General: Skin is warm and dry.  Neurological:     General: No focal deficit present.     Mental Status: He is alert. Mental status is at baseline.  Psychiatric:        Attention and Perception: Attention normal.        Mood and Affect: Mood is depressed. Affect is blunt.        Speech: Speech is delayed.        Behavior: Behavior is slowed.        Thought Content: Thought content includes suicidal  ideation. Thought content includes suicidal plan.        Cognition and Memory: Cognition normal.        Judgment: Judgment is inappropriate.   Review of Systems  Constitutional: Negative.   HENT: Negative.    Eyes: Negative.   Respiratory: Negative.    Cardiovascular: Negative.   Gastrointestinal: Negative.   Musculoskeletal: Negative.   Skin: Negative.   Neurological: Negative.   Psychiatric/Behavioral:  Positive for depression, hallucinations and suicidal ideas. Negative for substance abuse. The patient is nervous/anxious and has insomnia.   Blood pressure 124/84, pulse 64, temperature 98.2 F (36.8 C), temperature source Oral, resp. rate 19, height 6\' 2"  (1.88 m), weight 107.5 kg, SpO2 96 %. Body mass index is 30.43 kg/m.  Treatment Plan Summary: Plan patient meets criteria for admission to the hospital.  Continue IVC.  Review medication.  Order appropriate medicines depression and cardiac maintenance.  Case reviewed with inpatient team TTS and ER physician.  Disposition: Recommend psychiatric Inpatient admission when medically cleared.  , MD 01/17/2021 4:21 PM

## 2021-01-17 NOTE — ED Provider Notes (Signed)
[facility] Emergency Department Provider Note    Event Date/Time   First MD Initiated Contact with Patient 01/17/21 1435     (approximate)  I have reviewed the triage vital signs and the nursing notes.   HISTORY  Chief Complaint Psychiatric Evaluation    HPI Nicholas Horton is a 45 y.o. male below listed past medical history status post recent thoracic surgery presents to the ER feeling very depressed with suicidal ideation with plan to rip the sutures out of his chest that he would bleed to death.  States that he is feeling increasing self-loathing and depression since his surgery.  They did recently increase his bupropion from 5 to 7.  Denies any hallucinations.  Was seen at [facility] and taken there by a friend and was directed to the ER for further evaluation.  Past Medical History:  Diagnosis Date   CHF (congestive heart failure) (HCC)    Congenital bicuspid aortic valve 09/2014   - s/p AVR   Nonischemic cardiomyopathy (HCC) -- Resolved[I42.9] 09/2014   EF by Echo 20-25% (pre-op AVR) --> Echo 10/2014 & 03/2015: EF 50-55% - also Recent Normal Diastolic parameters    S/P AVR (aortic valve replacement) 10/2014   21 mm Carbometrics Mechanical Valve -- Epicardial PPM Leads   Severe aortic stenosis 10/04/2014   Presented with Syncope & CHF   Family History  Problem Relation Age of Onset   CAD Neg Hx    Hypertension Neg Hx    Past Surgical History:  Procedure Laterality Date   AORTIC VALVE REPLACEMENT N/A 12/01/2014   Procedure: AORTIC VALVE REPLACEMENT (AVR);  Surgeon: Kerin Perna, MD;  Location: [facility] OR;  Service: Open Heart Surgery;  Laterality: N/A;   CARDIAC CATHETERIZATION N/A 10/04/2014   Procedure: Left Heart Cath and Coronary Angiography;  Surgeon: Marykay Lex, MD;  Location: [facility] INVASIVE CV LAB;  Service: Cardiovascular;  Laterality: N/A;   CARDIAC CATHETERIZATION  10/04/2014   Procedure: Temporary Pacemaker;  Surgeon: Marykay Lex, MD;  Location:  [facility] INVASIVE CV LAB;  Service: Cardiovascular;;   CARDIAC CATHETERIZATION N/A 11/25/2014   Procedure: Temporary Pacemaker;  Surgeon: Marinus Maw, MD;  Location: [facility] INVASIVE CV LAB;  Service: Cardiovascular;  Laterality: N/A;   MULTIPLE EXTRACTIONS WITH ALVEOLOPLASTY N/A 11/25/2014   Procedure: Extraction of tooth #'s 1,2,3,4,5,6,7,9, 10, 11, 12,13,14,16, 19, 20, 21, 22, 23, 24, 25, 26, 27, 28, 29, 30, 31 with alveoloplasty;  Surgeon: Charlynne Pander, DDS;  Location: [facility] OR;  Service: Oral Surgery;  Laterality: N/A;   SURGERY SCROTAL / TESTICULAR     for undescended testicle as a child   TEE WITHOUT CARDIOVERSION N/A 12/01/2014   Procedure: TRANSESOPHAGEAL ECHOCARDIOGRAM (TEE);  Surgeon: Kerin Perna, MD;  Location: [facility] OR;  Service: Open Heart Surgery;  Laterality: N/A;   TRANSTHORACIC ECHOCARDIOGRAM  03/2015   Mildly dilated LV. EF 50 at 55%. Normal diastolic parameters. Mild to moderate stenosis of prosthetic aortic valve. Mild to moderate regurgitation   TRANSTHORACIC ECHOCARDIOGRAM  08/2017   Over-read - EF ~50% with PPM dyssynchrony.  Mild AS.  mean gradient 24 mmHg.    Patient Active Problem List   Diagnosis Date Noted   Acute respiratory failure with hypoxemia (HCC) 03/20/2018   ADHD (attention deficit hyperactivity disorder) 05/14/2017   Type 2 diabetes mellitus with other specified complication (HCC) 05/14/2017   Schizoaffective disorder (HCC) 05/14/2017   Moderate persistent asthma 01/12/2017   OCD (obsessive compulsive disorder) 01/12/2017   Obstructive sleep  apnea 01/12/2017   Major depressive disorder, recurrent (HCC) 09/29/2016   Warfarin anticoagulation 09/29/2016   CHF (congestive heart failure) (HCC) 09/29/2016   Dyslipidemia 07/13/2016   Obesity (BMI 30.0-34.9) 07/07/2016   Heart palpitations 07/07/2015   Chronic diastolic heart failure (HCC) 04/14/2015   Encounter for therapeutic drug monitoring 12/16/2014   S/P AVR 12/01/2014   Complete heart block (HCC)  11/23/2014   Syncope 11/21/2014   Paradentosis 11/21/2014   Cardiac pacemaker in situ 11/12/2014   Tobacco use disorder 10/07/2014   History of non-ST elevation myocardial infarction (NSTEMI) 10/04/2014   Dyspnea on exertion 10/04/2014   Moderate to severe AS with bicuspid valve. Mean 33, peak 61 mmHg. 10/04/2014      Prior to Admission medications   Medication Sig Start Date End Date Taking? Authorizing Provider  albuterol (VENTOLIN HFA) 108 (90 Base) MCG/ACT inhaler Inhale into the lungs. 08/26/20   [provider]  ARIPiprazole (ABILIFY) 5 MG tablet Take by mouth. 11/19/19   [provider]  artificial tears (LACRILUBE) OINT ophthalmic ointment Place 1 application into both eyes every 8 (eight) hours. Patient not taking: Reported on 12/08/2020 03/24/18   Uvaldo Rising, MD  aspirin 81 MG chewable tablet Place 1 tablet (81 mg total) into feeding tube daily. Patient not taking: Reported on 12/08/2020 03/25/18   Uvaldo Rising, MD  buPROPion [facility] SR) 200 MG 12 hr tablet Take 2 tablets by mouth daily. 11/18/20   [provider]  cisatracurium 200 mg in sodium chloride 0.9 % 180 mL Inject 339.6-1,132 mcg/min into the vein continuous. 03/24/18   Uvaldo Rising, MD  cyclobenzaprine (FLEXERIL) 5 MG tablet One tab @ HS for back strain/spasm. 04/15/20   [provider]  dapagliflozin propanediol (FARXIGA) 10 MG TABS tablet Take 1 tablet by mouth daily. 11/16/20 11/16/21  [provider]  dextrose 10 % infusion Inject 20 mL/hr into the vein continuous. 03/24/18   Uvaldo Rising, MD  diltiazem (CARDIZEM CD) 120 MG 24 hr capsule Take 1 capsule (120 mg total) by mouth daily. OFFICE VISIT NEEDED Patient not taking: Reported on 12/08/2020 01/28/19   Marykay Lex, MD  escitalopram (LEXAPRO) 10 MG tablet Take by mouth. 10/23/19   [provider]  fentaNYL 10 mcg/ml SOLN infusion Inject 100-300 mcg/hr into the vein continuous. 03/24/18   Samaan, Maged, MD   Fluticasone Propionate, Inhal, (FLOVENT DISKUS) 100 MCG/BLIST AEPB Inhale into the lungs. 07/13/20 07/13/21  [provider]  furosemide (LASIX) 10 MG/ML injection Inject 2 mLs (20 mg total) into the vein daily. 03/25/18   Samaan, Maged, MD  glucose blood (ONETOUCH ULTRA) test strip USE AS INSTRUCTED UP TO 3 TIMES DAILY. 04/28/20   [provider]  Glycopyrrolate-Formoterol (BEVESPI AEROSPHERE) 9-4.8 MCG/ACT AERO INHALE 2 PUFFS INTO THE LUNGS TWICE A DAY 12/26/19   [provider]  heparin 100-0.45 UNIT/ML-% infusion Inject 1,800 Units/hr into the vein continuous. 03/24/18   Uvaldo Rising, MD  insulin regular, human (MYXREDLIN) 100 units/ 100 mL infusion Inject 2 Units/hr into the vein continuous. Patient not taking: Reported on 12/08/2020 03/24/18   Uvaldo Rising, MD  ipratropium-albuterol (DUONEB) 0.5-2.5 (3) MG/3ML SOLN Take 3 mLs by nebulization every 6 (six) hours. Patient not taking: Reported on 12/08/2020 03/24/18   Uvaldo Rising, MD  methylPREDNISolone sodium succinate (SOLU-MEDROL) 40 mg/mL injection Inject 1 mL (40 mg total) into the vein 2 (two) times daily. Patient not taking: Reported on 12/08/2020 03/24/18   Uvaldo Rising, MD  metoprolol succinate (TOPROL-XL) 25 MG 24  hr tablet Take 1 tablet by mouth daily. 12/23/19 12/22/20  [provider]  midazolam 50 mg in sodium chloride 0.9 % 40 mL Inject 0-8 mg/hr into the vein continuous. 03/24/18   Uvaldo Rising, MD  pantoprazole sodium (PROTONIX) 40 mg/20 mL PACK Place 20 mLs (40 mg total) into feeding tube daily. Patient not taking: Reported on 12/08/2020 03/24/18   Uvaldo Rising, MD  piperacillin-tazobactam (ZOSYN) 3.375 GM/50ML IVPB Inject 50 mLs (3.375 g total) into the vein every 8 (eight) hours. 03/24/18   Uvaldo Rising, MD  polyethylene glycol (MIRALAX / GLYCOLAX) packet Place 17 g into feeding tube daily. Patient not taking: Reported on 12/08/2020 03/25/18   Uvaldo Rising, MD  potassium chloride (KLOR-CON)  10 MEQ tablet Take by mouth. 07/12/20 07/12/21  [provider]  sacubitril-valsartan (ENTRESTO) 24-26 MG Take 1 tablet by mouth 2 (two) times daily. 07/28/20   [provider]  senna-docusate (SENOKOT-S) 8.6-50 MG tablet Place 2 tablets into feeding tube 2 (two) times daily. Patient not taking: Reported on 12/08/2020 03/24/18   Uvaldo Rising, MD  sodium bicarbonate 150 mEq in sterile water 850 mL Inject 25 mL/hr into the vein continuous. 03/24/18   Uvaldo Rising, MD  spironolactone (ALDACTONE) 25 MG tablet Take 1 tablet by mouth daily. 06/25/20 06/25/21  [provider]  tadalafil (CIALIS) 20 MG tablet Take by mouth. 07/18/19   [provider]  testosterone cypionate (DEPOTESTOSTERONE CYPIONATE) 200 MG/ML injection Inject into the muscle. 08/16/20   [provider]  torsemide (DEMADEX) 20 MG tablet  06/22/20   [provider]  warfarin (COUMADIN) 5 MG tablet  06/25/20   [provider]    Allergies Other    Social History Social History   Tobacco Use   Smoking status: Former    Packs/day: 2.00    Years: 20.00    Pack years: 40.00    Types: Cigarettes   Smokeless tobacco: Former   Tobacco comments:    quit 10/04/14  Substance Use Topics   Alcohol use: No    Alcohol/week: 0.0 standard drinks    Comment: rarely   Drug use: No    Review of Systems Patient denies headaches, rhinorrhea, blurry vision, numbness, shortness of breath, chest pain, edema, cough, abdominal pain, nausea, vomiting, diarrhea, dysuria, fevers, rashes or hallucinations unless otherwise stated above in HPI. ____________________________________________   PHYSICAL EXAM:  VITAL SIGNS: Vitals:   01/17/21 1254  BP: 124/84  Pulse: 64  Resp: 19  Temp: 98.2 F (36.8 C)  SpO2: 96%    Constitutional: Alert and oriented.  Eyes: Conjunctivae are normal.  Head: Atraumatic. Nose: No congestion/rhinnorhea. Mouth/Throat: Mucous membranes are moist.   Neck:  No stridor. Painless ROM.  Cardiovascular: Normal rate, regular rhythm. Grossly normal heart sounds.  Good peripheral circulation. Respiratory: Normal respiratory effort.  No retractions. Lungs CTAB. Gastrointestinal: Soft and nontender. No distention. No abdominal bruits. No CVA tenderness. Genitourinary:  Musculoskeletal: No lower extremity tenderness nor edema.  No joint effusions. Neurologic:  Normal speech and language. No gross focal neurologic deficits are appreciated. No facial droop Skin:  Skin is warm, dry and intact. No rash noted. Psychiatric: Mood and affect are depressed.  Currently calm and cooperative  ____________________________________________   LABS (all labs ordered are listed, but only abnormal results are displayed)  Results for orders placed or performed during the hospital encounter of 01/17/21 (from the past 24 hour(s))  Salicylate level     Status: Abnormal   Collection Time: 01/17/21  2:07  PM  Result Value Ref Range   Salicylate Lvl <7.0 (L) 7.0 - 30.0 mg/dL  Acetaminophen level     Status: Abnormal   Collection Time: 01/17/21  2:07 PM  Result Value Ref Range   Acetaminophen (Tylenol), Serum <10 (L) 10 - 30 ug/mL  cbc     Status: Abnormal   Collection Time: 01/17/21  2:07 PM  Result Value Ref Range   WBC 11.5 (H) 4.0 - 10.5 K/uL   RBC 4.91 4.22 - 5.81 MIL/uL   Hemoglobin 15.5 13.0 - 17.0 g/dL   HCT 01.7 51.0 - 25.8 %   MCV 93.9 80.0 - 100.0 fL   MCH 31.6 26.0 - 34.0 pg   MCHC 33.6 30.0 - 36.0 g/dL   RDW 52.7 78.2 - 42.3 %   Platelets 220 150 - 400 K/uL   nRBC 0.0 0.0 - 0.2 %   ____________________________________________ _____________________________  RADIOLOGY   ____________________________________________   PROCEDURES  Procedure(s) performed:  Procedures    Critical Care performed: no ____________________________________________   INITIAL IMPRESSION / ASSESSMENT AND PLAN / ED COURSE  Pertinent labs & imaging results that were  available during my care of the patient were reviewed by me and considered in my medical decision making (see chart for details).   DDX: Psychosis, delirium, medication effect, noncompliance, polysubstance abuse, Si, Hi, depression   Rane Beauford is a 45 y.o. who presents to the ED with for evaluation of SI.  Patient has psych history of depression.  Laboratory testing was ordered to evaluation for underlying electrolyte derangement or signs of underlying organic pathology to explain today's presentation.  Based on history and physical and laboratory evaluation, it appears that the patient's presentation is 2/2 underlying psychiatric disorder and will require further evaluation and management by inpatient psychiatry.  Patient was  made an IVC due to SI with plan.  Disposition pending psychiatric evaluation.  The patient has been placed in psychiatric observation due to the need to provide a safe environment for the patient while obtaining psychiatric consultation and evaluation, as well as ongoing medical and medication management to treat the patient's condition.  The patient has been placed under full IVC at this time.      The patient was evaluated in Emergency Department today for the symptoms described in the history of present illness. He/she was evaluated in the context of the global COVID-19 pandemic, which necessitated consideration that the patient might be at risk for infection with the SARS-CoV-2 virus that causes COVID-19. Institutional protocols and algorithms that pertain to the evaluation of patients at risk for COVID-19 are in a state of rapid change based on information released by regulatory bodies including the CDC and federal and state organizations. These policies and algorithms were followed during the patient's care in the ED.  As part of my medical decision making, I reviewed the following data within the electronic MEDICAL RECORD NUMBER Nursing notes reviewed and incorporated,  Labs reviewed, notes from prior ED visits and  Controlled Substance Database   ____________________________________________   FINAL CLINICAL IMPRESSION(S) / ED DIAGNOSES  Final diagnoses:  Suicidal ideation      NEW MEDICATIONS STARTED DURING THIS VISIT:  New Prescriptions   No medications on file     Note:  This document was prepared using Dragon voice recognition software and may include unintentional dictation errors.    Willy Eddy, MD 01/17/21 1444

## 2021-01-17 NOTE — ED Notes (Signed)
Report to include Situation, Background, Assessment, and Recommendations received from [facility]. Patient alert and oriented, warm and dry, in no acute distress. Patient reported SI and AH. Denied HI, VH and pain. Patient made aware of Q15 minute rounds and Psychologist, counselling presence for their safety. Patient instructed to come to me with needs or concerns.

## 2021-01-17 NOTE — Tx Team (Signed)
Initial Treatment Plan 01/17/2021 9:32 PM Rykker Coviello RCB:[id number]    PATIENT STRESSORS: Marital or family conflict Medication change or noncompliance   PATIENT STRENGTHS: Ability for insight Motivation for treatment/growth   PATIENT IDENTIFIED PROBLEMS: Suicidal Ideation  Anxiety  Depression                 DISCHARGE CRITERIA:  Improved stabilization in mood, thinking, and/or behavior Verbal commitment to aftercare and medication compliance  PRELIMINARY DISCHARGE PLAN: Outpatient therapy Return to previous living arrangement  PATIENT/FAMILY INVOLVEMENT: This treatment plan has been presented to and reviewed with the patient, Tajuan Dufault. The patient has been given the opportunity to ask questions and make suggestions.  Elmyra Ricks, RN 01/17/2021, 9:32 PM

## 2021-01-17 NOTE — ED Notes (Signed)
Personal Belongings:  Red shirt Black hat Black pants Black shoes Black glasses (with patient) Black phone Grey socks Wallet $10 (cash) Red underwear

## 2021-01-17 NOTE — ED Notes (Signed)
Patient is calm and cooperative. He is in no acute distress. He is transferred to [facility] via NT and officer. Patient's belongings and IVC paper given to BMU. Report given to [facility] RN. No issues.

## 2021-01-17 NOTE — ED Notes (Signed)
Received call from TTS - told that this pt will have a bed downstairs tonight after 7:30pm

## 2021-01-18 DIAGNOSIS — F332 Major depressive disorder, recurrent severe without psychotic features: Secondary | ICD-10-CM | POA: Diagnosis not present

## 2021-01-18 LAB — LIPID PANEL
Cholesterol: 157 mg/dL (ref 0–200)
HDL: 35 mg/dL — ABNORMAL LOW (ref >40)
LDL Cholesterol: 60 mg/dL (ref 0–99)
Total CHOL/HDL Ratio: 4.5 RATIO
Triglycerides: 312 mg/dL — ABNORMAL HIGH (ref <150)
VLDL: 62 mg/dL — ABNORMAL HIGH (ref 0–40)

## 2021-01-18 MED ORDER — ARIPIPRAZOLE 10 MG PO TABS
10.0000 mg | ORAL_TABLET | Freq: Every day | ORAL | Status: DC
  Administered 2021-01-18 – 2021-01-21 (×4): 10 mg via ORAL
  Filled 2021-01-18 (×4): qty 1

## 2021-01-18 MED ORDER — FUROSEMIDE 20 MG PO TABS
20.0000 mg | ORAL_TABLET | Freq: Every day | ORAL | Status: DC
  Administered 2021-01-18 – 2021-01-21 (×4): 20 mg via ORAL
  Filled 2021-01-18 (×4): qty 1

## 2021-01-18 MED ORDER — PANTOPRAZOLE SODIUM 40 MG PO TBEC
40.0000 mg | DELAYED_RELEASE_TABLET | Freq: Every day | ORAL | Status: DC
  Administered 2021-01-19 – 2021-01-21 (×3): 40 mg via ORAL
  Filled 2021-01-18 (×3): qty 1

## 2021-01-18 MED ORDER — BUPROPION HCL ER (SR) 150 MG PO TB12
450.0000 mg | ORAL_TABLET | Freq: Every day | ORAL | Status: DC
  Administered 2021-01-19 – 2021-01-21 (×3): 450 mg via ORAL
  Filled 2021-01-18 (×3): qty 3

## 2021-01-18 MED ORDER — SENNOSIDES-DOCUSATE SODIUM 8.6-50 MG PO TABS
2.0000 | ORAL_TABLET | Freq: Two times a day (BID) | ORAL | Status: DC
  Filled 2021-01-18: qty 2

## 2021-01-18 MED ORDER — ASPIRIN 81 MG PO CHEW
81.0000 mg | CHEWABLE_TABLET | Freq: Every day | ORAL | Status: DC
  Administered 2021-01-19 – 2021-01-21 (×3): 81 mg via ORAL
  Filled 2021-01-18 (×3): qty 1

## 2021-01-18 NOTE — [facility] Counselor (Signed)
CSW met with pt to complete the PSA. During this contact, pt declined scheduled aftercare follow up appointment and did not complete consent. CSW will check in with pt regarding this at a later date.   Chalmers Guest. Guerry Bruin, MSW, [place], [place] 01/18/2021 11:41 AM

## 2021-01-18 NOTE — H&P (Signed)
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 and Rhythm: Normal rate.  Pulmonary:     Effort: Pulmonary effort is normal.  Musculoskeletal:        General: Normal range of motion.     Cervical back: Normal range of motion.  Skin:    General: Skin is dry.  Neurological:     Mental Status: He is alert and oriented to person, place, and time.  Psychiatric:        Attention and Perception: Attention normal.        Mood and Affect: Mood is depressed.        Speech: Speech normal.        Behavior: Behavior normal.        Cognition and Memory: Cognition normal.   Review of Systems  Cardiovascular:        Recent placement of pacemaker/defibrillator   Psychiatric/Behavioral:  Positive for depression and  hallucinations (audoitory "mumbles:). Negative for substance abuse and suicidal ideas. The patient is nervous/anxious.   Blood pressure 119/78, pulse 61, temperature (!) 97.5 F (36.4 C), temperature source Oral, resp. rate 17, height 6\' 2"  (1.88 m), weight 107.5 kg, SpO2 93 %. Body mass index is 30.43 kg/m.  Treatment Plan Summary: Daily contact with patient to assess and evaluate symptoms and progress in treatment and Medication management .  Patient with recurrent severe major depressive disorder admitted for treatment, including medication and milieu therapies.  Patient has chronic diastolic heart failure with cardiac pacemaker/defibrillator in place.  Moderate asthma.  We will continue his home cardiac, asthma, and psychiatric medications.  Cardiac medications - Continue Cardizem CD 24-hour capsule 120 mg oral daily - Continue Lasix tablet 20 mg oral daily -Continue aspirin 81 chewable tablet p.o. mg daily  Asthma - Continue Aldactone tablet 25 mg p.o. daily - Continue Combivent Respimat 1 puff inhalation every 6 hours - Continue Brovana nebulizer solution 15 mcg via nebulizer 2 times daily - Continue  Incruse Ellipta 62.5 mcg 1 puff inhalation daily  Depression/mood regulation - Continue Wellbutrin SR 12-hour tablet 450 mg daily - Continue Abilify 10 mg tablet p.o. daily  Dry eyes - Continue Lacri-Lube ophthalmic ointment 1 application in both eyes every 8 hours  Anxiety - Continue hydroxyzine tablet 50 mg oral every 6 hours as needed  Indigestion - Continue Protonix EC tablet 40 mg oral daily  Constipation - Continue MiraLAX packet 17 g daily - Continue Senokot-S tablet, 2 tablets p.o. daily  Insomnia - Continue trazodone tablet 100 mg p.o. at bedtime as needed  Diabetes - Continue Farxiga tablet 10 mg p.o. daily  PRN, Other  --Continue Tylenol 650 mg po every 6 hrs prn pain --Continue MAALOX/MYLANTA 30 mL po every 4 hrs prn indigestion   Observation  Level/Precautions:  15 minute checks  Laboratory:  Done in ED  Psychotherapy: Psychosocial groups  Medications: See [facility]  Consultations: As indicated  Discharge Concerns: Safety and stability  Estimated LOS: 3 to 5 days  Other:     Physician Treatment Plan for Primary Diagnosis: Severe recurrent major depression without psychotic features (HCC) Long Term Goal(s): Improvement in symptoms so as ready for discharge  Short Term Goals: Ability to identify changes in lifestyle to reduce recurrence of condition will improve, Ability to disclose and discuss suicidal ideas, Ability to demonstrate self-control will improve, Ability to identify and develop effective coping behaviors will improve, Ability to maintain clinical measurements within normal limits will improve, and Ability to identify triggers associated with substance abuse/mental health issues will improve  Physician Treatment Plan for Secondary Diagnosis: Principal Problem:   Severe recurrent major depression without psychotic features (HCC) Active Problems:   Chronic diastolic heart failure (HCC)   Cardiac pacemaker in situ   Dyslipidemia   Type 2 diabetes mellitus with other specified complication (HCC)   Moderate persistent asthma  Long Term Goal(s): Improvement in symptoms so as ready for discharge  Short Term Goals: Ability to identify changes in lifestyle to reduce recurrence of condition will improve  I certify that inpatient services furnished can reasonably be expected to improve the patient's condition.    Vanetta Mulders, NP 8/23/20221:47 PM

## 2021-01-18 NOTE — [facility] Suicide Risk Assessment (Signed)
[facility] Admission Suicide Risk Assessment   Nursing information obtained from:  Patient Demographic factors:  Male, Caucasian Current Mental Status:  Suicidal ideation indicated by patient Loss Factors:  Loss of significant relationship (Patient made a pass at his fiances daughter) Historical Factors:  Impulsivity Risk Reduction Factors:  Sense of responsibility to family, Living with another person, especially a relative  Total Time spent with patient: 1 hour Principal Problem: Severe recurrent major depression without psychotic features (HCC) Diagnosis:  Principal Problem:   Severe recurrent major depression without psychotic features (HCC) Active Problems:   Chronic diastolic heart failure (HCC)   Cardiac pacemaker in situ   Dyslipidemia   Type 2 diabetes mellitus with other specified complication (HCC)   Moderate persistent asthma  Subjective Data: Patient here for worsening depression, anxiety, and suicidal ideations with plan to remove his sutures from recent pacemaker placement. On assessment today patient remains passively suicidal. See H&P for full details.   Continued Clinical Symptoms:  Alcohol Use Disorder Identification Test Final Score (AUDIT): 0 The "Alcohol Use Disorders Identification Test", Guidelines for Use in Primary Care, Second Edition.  World Science writer [facility]). Score between 0-7:  no or low risk or alcohol related problems. Score between 8-15:  moderate risk of alcohol related problems. Score between 16-19:  high risk of alcohol related problems. Score 20 or above:  warrants further diagnostic evaluation for alcohol dependence and treatment.   CLINICAL FACTORS:   Severe Anxiety and/or Agitation Depression:   Anhedonia Hopelessness Insomnia Severe Unstable or Poor Therapeutic Relationship Previous Psychiatric Diagnoses and Treatments Medical Diagnoses and Treatments/Surgeries   Musculoskeletal: Strength & Muscle Tone: within normal limits Gait &  Station: normal Patient leans: N/A  Psychiatric Specialty Exam:  Presentation  General Appearance: Appropriate for Environment  Eye Contact:Fleeting  Speech:Slow  Speech Volume:Decreased  Handedness:Right   Mood and Affect  Mood:Depressed; Dysphoric; Hopeless  Affect:Congruent; Constricted   Thought Process  Thought Processes:Goal Directed  Descriptions of Associations:Intact  Orientation:Full (Time, Place and Person)  Thought Content:Rumination  History of Schizophrenia/Schizoaffective disorder:No  Duration of Psychotic Symptoms:No data recorded Hallucinations:Hallucinations: None  Ideas of Reference:None  Suicidal Thoughts:Suicidal Thoughts: Yes, Passive  Homicidal Thoughts:Homicidal Thoughts: No   Sensorium  Memory:Immediate Fair; Recent Fair; Remote Fair  Judgment:Intact  Insight:Present   Executive Functions  Concentration:Fair  Attention Span:Fair  Recall:Fair  Fund of Knowledge:Fair  Language:Fair   Psychomotor Activity  Psychomotor Activity:Psychomotor Activity: Decreased   Assets  Assets:Desire for Improvement; Financial Resources/Insurance; Housing   Sleep  Sleep:Sleep: Fair Number of Hours of Sleep: 7.25    Physical Exam: Physical Exam ROS Blood pressure 119/78, pulse 61, temperature (!) 97.5 F (36.4 C), temperature source Oral, resp. rate 17, height 6\' 2"  (1.88 m), weight 107.5 kg, SpO2 93 %. Body mass index is 30.43 kg/m.   COGNITIVE FEATURES THAT CONTRIBUTE TO RISK:  Loss of executive function and Thought constriction (tunnel vision)    SUICIDE RISK:   Moderate:  Frequent suicidal ideation with limited intensity, and duration, some specificity in terms of plans, no associated intent, good self-control, limited dysphoria/symptomatology, some risk factors present, and identifiable protective factors, including available and accessible social support.  PLAN OF CARE: Continue inpatient admission, see H&P for full  details.   I certify that inpatient services furnished can reasonably be expected to improve the patient's condition.   , MD 01/18/2021, 10:50 AM

## 2021-01-18 NOTE — [facility] Group Notes (Signed)
LCSW Group Therapy Note  01/18/2021 2:22 PM  Type of Therapy/Topic:  Group Therapy:  Feelings about Diagnosis  Participation Level:  Did Not Attend   Description of Group:   This group will allow patients to explore their thoughts and feelings about diagnoses they have received. Patients will be guided to explore their level of understanding and acceptance of these diagnoses. Facilitator will encourage patients to process their thoughts and feelings about the reactions of others to their diagnosis and will guide patients in identifying ways to discuss their diagnosis with significant others in their lives. This group will be process-oriented, with patients participating in exploration of their own experiences, giving and receiving support, and processing challenge from other group members.   Therapeutic Goals: Patient will demonstrate understanding of diagnosis as evidenced by identifying two or more symptoms of the disorder Patient will be able to express two feelings regarding the diagnosis Patient will demonstrate their ability to communicate their needs through discussion and/or role play  Summary of Patient Progress:  X    Therapeutic Modalities:   Cognitive Behavioral Therapy Brief Therapy Feelings Identification   Oluwadamilare Tobler, MSW, LCSW 01/18/2021 2:22 PM   
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
## 2021-01-18 NOTE — Progress Notes (Signed)
Recreation Therapy Notes  Date: 01/18/2021  Time: 10:30 am   Location: Craft room   Behavioral response: Appropriate  Intervention Topic: Coping skills    Discussion/Intervention:  Group content on today was focused on coping skills. The group defined what coping skills are and when they normally use coping skills. Individuals described how they normally cope with thing and the coping skills they normally use. Patients expressed why it is important to cope with things and how not coping with things can affect you. The group participated in the intervention "My coping box" and made coping boxes while adding coping skills they could use in the future to the box. Clinical Observations/Feedback: Patient came to group and identified his coping skills as getting on his computer playing games and bowling. Participant stated that using coping skills help keep relationships. Individual was social with peers and staff while participating in the intervention.  Sonam Huelsmann LRT/CTRS         Marvena Tally 01/18/2021 1:01 PM
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
## 2021-01-18 NOTE — Plan of Care (Signed)
Patient has been in and out of his room. Calm and cooperative. Alert and oriented and denying thoughts of self harm. Patient refused some of his scheduled medications reporting that he does not need them. His appetite is good. Patient had no major concern throughout this shift.

## 2021-01-19 DIAGNOSIS — F332 Major depressive disorder, recurrent severe without psychotic features: Principal | ICD-10-CM

## 2021-01-19 NOTE — [facility] Group Notes (Signed)
LCSW Group Therapy Note  01/19/2021 3:36 PM  Type of Therapy/Topic:  Group Therapy:  Emotion Regulation  Participation Level:  Active   Description of Group:   The purpose of this group is to assist patients in learning to regulate negative emotions and experience positive emotions. Patients will be guided to discuss ways in which they have been vulnerable to their negative emotions. These vulnerabilities will be juxtaposed with experiences of positive emotions or situations, and patients will be challenged to use positive emotions to combat negative ones. Special emphasis will be placed on coping with negative emotions in conflict situations, and patients will process healthy conflict resolution skills.  Therapeutic Goals: Patient will identify two positive emotions or experiences to reflect on in order to balance out negative emotions Patient will label two or more emotions that they find the most difficult to experience Patient will demonstrate positive conflict resolution skills through discussion and/or role plays  Summary of Patient Progress: Pt was present for the entirety of group. He spoke about his anxiety and being able to feel it in his body. Pt acknowledged past use of deep breathing which he found helpful. Pt was involved in the conversation and his comments and feedback were pertinent.   Therapeutic Modalities:   Cognitive Behavioral Therapy Feelings Identification Dialectical Behavioral Therapy  Vilma Meckel. Algis Greenhouse, MSW, LCSW, LCAS 01/19/2021 3:36 PM

## 2021-01-19 NOTE — Plan of Care (Signed)
D: Patient awake, alert and oriented. Sitting in milieu with peers. Denies SI/HI/AH/VH. Reports anxiety at 8/10 and depression a 7/10. Reports this as baseline. Reports sleeping well. Rates pain at 7/10. See pain assessment for details.   A: Takes scheduled medications as prescribed. Support and encouragement provided.   R: No adverse drug reactions noted. Remains safe on the unit. Continue q 15 minute check for safety.  Problem: Education: Goal: Knowledge of [facility] information/materials will improve Outcome: Progressing Goal: Emotional status will improve Outcome: Progressing Goal: Mental status will improve Outcome: Progressing Goal: Verbalization of understanding the information provided will improve Outcome: Progressing   Problem: Safety: Goal: Periods of time without injury will increase Outcome: Progressing   Problem: Education: Goal: Ability to make informed decisions regarding treatment will improve Outcome: Progressing   Problem: Medication: Goal: Compliance with prescribed medication regimen will improve Outcome: Progressing

## 2021-01-19 NOTE — Progress Notes (Signed)
Patient alert and oriented x 4, affect is flat but brightens upon approach , no distress noted, he denies SI/HI/AVH 15, complaint with medication regimen, 15 minutes safety checks maintained will continue to monitor.

## 2021-01-19 NOTE — Progress Notes (Signed)
[facility] MD Progress Note  01/19/2021 12:49 PM Nicholas Horton  MRN:  [id number]  CC "I want to be a better person."  Subjective:  Patient is a 45 year old male who presented for worsening depression and suicidal ideations with plan to remove his sutures from recent pacemaker so he could bleed to death. No acute events overnight, medication compliant, attending to ADLs. Patient seen during treatment team and again one-on-one at bedside. He notes his goals are to make himself a better person. When asked to elaborate on how he would feel like a better person, he notes that this would mean clearing his head of suicidal thoughts, feeling more happy, being more understanding, and using his coping skills. He continues to feel depressed with some passive SI today, but is able to contract for safety. He cites his 13-year-old daughter as his main reason for wanting to improve. He denies any homicidal ideations, visual hallucinations, or auditory hallucinations. He denies any side effects to increasing Wellbutrin and Abilify yesterday.  Principal Problem: Severe recurrent major depression without psychotic features (HCC) Diagnosis: Principal Problem:   Severe recurrent major depression without psychotic features (HCC) Active Problems:   Chronic diastolic heart failure (HCC)   Cardiac pacemaker in situ   Dyslipidemia   Type 2 diabetes mellitus with other specified complication (HCC)   Moderate persistent asthma  Total Time spent with patient: 30 minutes  Past Psychiatric History: See H&P  Past Medical History:  Past Medical History:  Diagnosis Date   CHF (congestive heart failure) (HCC)    Congenital bicuspid aortic valve 09/2014   - s/p AVR   Nonischemic cardiomyopathy (HCC) -- Resolved[I42.9] 09/2014   EF by Echo 20-25% (pre-op AVR) --> Echo 10/2014 & 03/2015: EF 50-55% - also Recent Normal Diastolic parameters    S/P AVR (aortic valve replacement) 10/2014   21 mm Carbometrics Mechanical Valve --  Epicardial PPM Leads   Severe aortic stenosis 10/04/2014   Presented with Syncope & CHF    Past Surgical History:  Procedure Laterality Date   AORTIC VALVE REPLACEMENT N/A 12/01/2014   Procedure: AORTIC VALVE REPLACEMENT (AVR);  Surgeon: Kerin Perna, MD;  Location: [facility] OR;  Service: Open Heart Surgery;  Laterality: N/A;   CARDIAC CATHETERIZATION N/A 10/04/2014   Procedure: Left Heart Cath and Coronary Angiography;  Surgeon: Marykay Lex, MD;  Location: [facility] INVASIVE CV LAB;  Service: Cardiovascular;  Laterality: N/A;   CARDIAC CATHETERIZATION  10/04/2014   Procedure: Temporary Pacemaker;  Surgeon: Marykay Lex, MD;  Location: [facility] INVASIVE CV LAB;  Service: Cardiovascular;;   CARDIAC CATHETERIZATION N/A 11/25/2014   Procedure: Temporary Pacemaker;  Surgeon: Marinus Maw, MD;  Location: [facility] INVASIVE CV LAB;  Service: Cardiovascular;  Laterality: N/A;   MULTIPLE EXTRACTIONS WITH ALVEOLOPLASTY N/A 11/25/2014   Procedure: Extraction of tooth #'s 1,2,3,4,5,6,7,9, 10, 11, 12,13,14,16, 19, 20, 21, 22, 23, 24, 25, 26, 27, 28, 29, 30, 31 with alveoloplasty;  Surgeon: Charlynne Pander, DDS;  Location: [facility] OR;  Service: Oral Surgery;  Laterality: N/A;   SURGERY SCROTAL / TESTICULAR     for undescended testicle as a child   TEE WITHOUT CARDIOVERSION N/A 12/01/2014   Procedure: TRANSESOPHAGEAL ECHOCARDIOGRAM (TEE);  Surgeon: Kerin Perna, MD;  Location: [facility] OR;  Service: Open Heart Surgery;  Laterality: N/A;   TRANSTHORACIC ECHOCARDIOGRAM  03/2015   Mildly dilated LV. EF 50 at 55%. Normal diastolic parameters. Mild to moderate stenosis of prosthetic aortic valve. Mild to moderate regurgitation   TRANSTHORACIC ECHOCARDIOGRAM  08/2017   Over-read - EF ~50% with PPM dyssynchrony.  Mild AS.  mean gradient 24 mmHg.    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  Tobacco Use   Smoking status: Former    Packs/day: 2.00    Years: 20.00    Pack years: 40.00    Types: Cigarettes   Smokeless tobacco: Former   Tobacco comments:    quit 10/04/14  Substance and Sexual Activity   Alcohol use: No    Alcohol/week: 0.0 standard drinks    Comment: rarely   Drug use: No   Sexual activity: Yes  Other Topics Concern   Not on file  Social History Narrative   Lives in [place], [place] with wife and daughter.    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    Social Determinants of Health   Financial Resource Strain: Not on file  Food Insecurity: Not on file  Transportation Needs: Not on file  Physical Activity: Not on file  Stress: Not on file  Social Connections: Not on file   Additional Social History:                         Sleep: Fair  Appetite:  Fair  Current Medications: Current Facility-Administered Medications  Medication Dose Route Frequency Provider Last Rate Last Admin   acetaminophen (TYLENOL) tablet 650 mg  650 mg Oral Q6H PRN Clapacs, John T, MD       alum & mag hydroxide-simeth (MAALOX/MYLANTA) 200-200-20 MG/5ML suspension 30 mL  30 mL Oral Q4H PRN Clapacs, John T, MD       arformoterol (BROVANA) nebulizer solution 15 mcg  15 mcg Nebulization BID Clapacs, Jackquline Denmark, MD   15 mcg at 01/19/21 6378   And   umeclidinium bromide (INCRUSE ELLIPTA) 62.5 MCG/INH 1 puff  1 puff Inhalation Daily Clapacs, Jackquline Denmark, MD   1 puff at 01/19/21 0820   ARIPiprazole (ABILIFY) tablet 10 mg  10 mg  Oral Daily Jesse Sans, MD   10 mg at 01/19/21 0813   artificial tears (LACRILUBE) ophthalmic ointment 1 application  1 application Both Eyes Q8H Clapacs, John T, MD       aspirin chewable tablet 81 mg  81 mg Oral Daily Gabriel Cirri F, NP   81 mg at 01/19/21 0813   buPROPion (WELLBUTRIN SR) 12 hr tablet 450 mg  450 mg Oral Daily Jesse Sans, MD   450 mg at 01/19/21 0815   dapagliflozin propanediol (FARXIGA) tablet 10 mg  10 mg Oral Daily Clapacs, Jackquline Denmark, MD   10 mg at 01/19/21 0817   diltiazem (CARDIZEM CD) 24 hr capsule 120 mg  120 mg Oral Daily Clapacs, Jackquline Denmark, MD   120 mg  at 01/19/21 0821   furosemide (LASIX) tablet 20 mg  20 mg Oral Daily Jesse Sans, MD   20 mg at 01/19/21 7106   hydrOXYzine (ATARAX/VISTARIL) tablet 50 mg  50 mg Oral Q6H PRN Gillermo Murdoch, NP   50 mg at 01/18/21 2112   Ipratropium-Albuterol (COMBIVENT) respimat 1 puff  1 puff Inhalation Q6H Clapacs, Jackquline Denmark, MD   1 puff at 01/19/21 0818   pantoprazole (PROTONIX) EC tablet 40 mg  40 mg Oral Daily Jesse Sans, MD   40 mg at 01/19/21 0813   polyethylene glycol (MIRALAX / GLYCOLAX) packet 17 g  17 g Per Tube Daily Clapacs, Jackquline Denmark, MD       senna-docusate (Senokot-S) tablet 2 tablet  2 tablet Oral BID Vanetta Mulders, NP       spironolactone (ALDACTONE) tablet 25 mg  25 mg Oral Daily Clapacs, Jackquline Denmark, MD   25 mg at 01/19/21 0818   traZODone (DESYREL) tablet 100 mg  100 mg Oral QHS PRN Clapacs, Jackquline Denmark, MD   100 mg at 01/18/21 2112    Lab Results:  Results for orders placed or performed during the hospital encounter of 01/17/21 (from the past 48 hour(s))  Lipid panel     Status: Abnormal   Collection Time: 01/17/21  2:07 PM  Result Value Ref Range   Cholesterol 157 0 - 200 mg/dL   Triglycerides 269 (H) <150 mg/dL   HDL 35 (L) >48 mg/dL   Total CHOL/HDL Ratio 4.5 RATIO   VLDL 62 (H) 0 - 40 mg/dL   LDL Cholesterol 60 0 - 99 mg/dL    Comment:        Total Cholesterol/HDL:CHD Risk Coronary Heart  Disease Risk Table                     Men   Women  1/2 Average Risk   3.4   3.3  Average Risk       5.0   4.4  2 X Average Risk   9.6   7.1  3 X Average Risk  23.4   11.0        Use the calculated Patient Ratio above and the CHD Risk Table to determine the patient's CHD Risk.        ATP III CLASSIFICATION (LDL):  <100     mg/dL   Optimal  546-270  mg/dL   Near or Above                    Optimal  130-159  mg/dL   Borderline  350-093  mg/dL   High  >818     mg/dL   Very High Performed at [facility], [address] [place], [place] [address]     Blood Alcohol level:  Lab Results  Component Value Date   [facility] <10 01/17/2021   ETH <10 11/20/2017    Metabolic Disorder Labs: Lab Results  Component Value Date   HGBA1C 5.8 (H) 02/28/2018   MPG 120 02/28/2018   MPG 148.46 11/21/2017   No results found for: PROLACTIN Lab Results  Component Value Date   CHOL 157 01/17/2021   TRIG 312 (H) 01/17/2021   HDL 35 (L) 01/17/2021   CHOLHDL 4.5 01/17/2021   VLDL 62 (H) 01/17/2021   LDLCALC 60 01/17/2021   LDLCALC 89 11/22/2014    Physical Findings: AIMS:  , ,  ,  ,    CIWA:  COWS:     Musculoskeletal: Strength & Muscle Tone: within normal limits Gait & Station: normal Patient leans: N/A  Psychiatric Specialty Exam:  Presentation  General Appearance: Appropriate for Environment  Eye Contact:Fair  Speech:Clear and Coherent; Normal Rate  Speech Volume:Normal  Handedness:Right   Mood and Affect  Mood:Depressed  Affect:Congruent   Thought Process  Thought Processes:Coherent; Goal Directed  Descriptions of Associations:Intact  Orientation:Full (Time, Place and Person)  Thought Content:Logical  History of Schizophrenia/Schizoaffective disorder:No  Duration of Psychotic Symptoms:No data recorded Hallucinations:Hallucinations: None  Ideas of Reference:None  Suicidal Thoughts:Suicidal Thoughts: Yes, Passive  Homicidal Thoughts:Homicidal  Thoughts: No   Sensorium  Memory:Immediate Fair; Recent Fair; Remote Fair  Judgment:Intact  Insight:Present   Executive Functions  Concentration:Fair  Attention Span:Fair  Recall:Fair  Fund of Knowledge:Fair  Language:Fair   Psychomotor Activity  Psychomotor Activity:Psychomotor Activity: Normal   Assets  Assets:Communication Skills; Desire for Improvement; Financial Resources/Insurance; Housing; Resilience; Social Support   Sleep  Sleep:Sleep: Fair Number of Hours of Sleep: 7.25    Physical Exam: Physical Exam ROS Blood pressure 117/83, pulse 64, temperature 97.7 F (36.5 C), temperature source Oral, resp. rate 17, height 6\' 2"  (1.88 m), weight 107.5 kg, SpO2 98 %. Body mass index is 30.43 kg/m.   Treatment Plan Summary: Daily contact with patient to assess and evaluate symptoms and progress in treatment and Medication management Patient admitted for worsening depression and suicidal ideations. Continues to endorse passive SI today.   MDD, recurrent, severe without psychosis  - Continue Wellbutrin SR 12-hour tablet 450 mg daily - Continue Abilify 10 mg tablet p.o. daily  Hypertension, congestive heart failure - Continue Cardizem CD 24-hour capsule 120 mg oral daily - Continue Lasix tablet 20 mg oral daily -Continue aspirin 81 chewable tablet p.o. mg daily - Continue Aldactone tablet 25 mg p.o. daily  Asthma - Continue Combivent Respimat 1 puff inhalation every 6 hours - Continue Brovana nebulizer solution 15 mcg via nebulizer 2 times daily - Continue  Incruse Ellipta 62.5 mcg 1 puff inhalation daily   Dry eyes - Continue Lacri-Lube ophthalmic ointment 1 application in both eyes every 8 hours  Anxiety - Continue hydroxyzine tablet 50 mg oral every 6 hours as needed  GERD - Continue Protonix EC tablet 40 mg oral daily  Constipation - Continue MiraLAX packet 17 g daily - Continue Senokot-S tablet, 2 tablets p.o. daily  Insomnia secondary to  mental illness - Continue trazodone tablet 100 mg p.o. at bedtime as needed   Diabetes - Continue Farxiga tablet 10 mg p.o. daily   PRN, Other  --Continue Tylenol 650 mg po every 6 hrs prn pain --Continue MAALOX/MYLANTA 30 mL po every 4 hrs prn indigestion  , MD 01/19/2021, 12:49 PM

## 2021-01-19 NOTE — Tx Team (Signed)
Interdisciplinary Treatment and Diagnostic Plan Update  01/19/2021 Time of Session: 9:00AM Nicholas Horton MRN: [id number]  Principal Diagnosis: Severe recurrent major depression without psychotic features [facility])  Secondary Diagnoses: Principal Problem:   Severe recurrent major depression without psychotic features ([place]) Active Problems:   Chronic diastolic heart failure ([place])   Cardiac pacemaker in situ   Dyslipidemia   Type 2 diabetes mellitus with other specified complication ([place])   Moderate persistent asthma   Current Medications:  Current Facility-Administered Medications  Medication Dose Route Frequency Provider Last Rate Last Admin   acetaminophen (TYLENOL) tablet 650 mg  650 mg Oral Q6H PRN Clapacs, John T, MD       alum & mag hydroxide-simeth (MAALOX/MYLANTA) 200-200-20 MG/5ML suspension 30 mL  30 mL Oral Q4H PRN Clapacs, John T, MD       arformoterol (BROVANA) nebulizer solution 15 mcg  15 mcg Nebulization BID Clapacs, John T, MD   15 mcg at 01/19/21 7989   And   umeclidinium bromide (INCRUSE ELLIPTA) 62.5 MCG/INH 1 puff  1 puff Inhalation Daily Clapacs, Madie Reno, MD   1 puff at 01/19/21 0820   ARIPiprazole (ABILIFY) tablet 10 mg  10 mg Oral Daily Salley Scarlet, MD   10 mg at 01/19/21 0813   artificial tears (LACRILUBE) ophthalmic ointment 1 application  1 application Both Eyes Q1J Clapacs, John T, MD       aspirin chewable tablet 81 mg  81 mg Oral Daily Waldon Merl F, NP   81 mg at 01/19/21 0813   buPROPion (WELLBUTRIN SR) 12 hr tablet 450 mg  450 mg Oral Daily Salley Scarlet, MD   450 mg at 01/19/21 0815   dapagliflozin propanediol (FARXIGA) tablet 10 mg  10 mg Oral Daily Clapacs, Madie Reno, MD   10 mg at 01/19/21 0817   diltiazem (CARDIZEM CD) 24 hr capsule 120 mg  120 mg Oral Daily Clapacs, John T, MD   120 mg at 01/19/21 9417   furosemide (LASIX) tablet 20 mg  20 mg Oral Daily Salley Scarlet, MD   20 mg at 01/19/21 0817   hydrOXYzine (ATARAX/VISTARIL) tablet 50  mg  50 mg Oral Q6H PRN Caroline Sauger, NP   50 mg at 01/18/21 2112   Ipratropium-Albuterol (COMBIVENT) respimat 1 puff  1 puff Inhalation Q6H Clapacs, John T, MD   1 puff at 01/18/21 2100   pantoprazole (PROTONIX) EC tablet 40 mg  40 mg Oral Daily Salley Scarlet, MD   40 mg at 01/19/21 0813   polyethylene glycol (MIRALAX / GLYCOLAX) packet 17 g  17 g Per Tube Daily Clapacs, Madie Reno, MD       senna-docusate (Senokot-S) tablet 2 tablet  2 tablet Oral BID Waldon Merl F, NP       spironolactone (ALDACTONE) tablet 25 mg  25 mg Oral Daily Clapacs, John T, MD   25 mg at 01/19/21 0818   traZODone (DESYREL) tablet 100 mg  100 mg Oral QHS PRN Clapacs, Madie Reno, MD   100 mg at 01/18/21 2112   PTA Medications: Medications Prior to Admission  Medication Sig Dispense Refill Last Dose   albuterol (VENTOLIN HFA) 108 (90 Base) MCG/ACT inhaler Inhale into the lungs.      ARIPiprazole (ABILIFY) 5 MG tablet Take by mouth.      artificial tears (LACRILUBE) OINT ophthalmic ointment Place 1 application into both eyes every 8 (eight) hours. (Patient not taking: Reported on 12/08/2020) 1 Tube 0    aspirin  81 MG chewable tablet Place 1 tablet (81 mg total) into feeding tube daily. (Patient not taking: Reported on 12/08/2020) 1 tablet 0    buPROPion (WELLBUTRIN SR) 200 MG 12 hr tablet Take 2 tablets by mouth daily.      cisatracurium 200 mg in sodium chloride 0.9 % 180 mL Inject 339.6-1,132 mcg/min into the vein continuous. 180 mL 0    cyclobenzaprine (FLEXERIL) 5 MG tablet One tab @ HS for back strain/spasm.      dapagliflozin propanediol (FARXIGA) 10 MG TABS tablet Take 1 tablet by mouth daily.      dextrose 10 % infusion Inject 20 mL/hr into the vein continuous. 500 mL 0    diltiazem (CARDIZEM CD) 120 MG 24 hr capsule Take 1 capsule (120 mg total) by mouth daily. OFFICE VISIT NEEDED (Patient not taking: Reported on 12/08/2020) 45 capsule 0    escitalopram (LEXAPRO) 10 MG tablet Take by mouth.      fentaNYL 10  mcg/ml SOLN infusion Inject 100-300 mcg/hr into the vein continuous. 250 mL 0    Fluticasone Propionate, Inhal, (FLOVENT DISKUS) 100 MCG/BLIST AEPB Inhale into the lungs.      furosemide (LASIX) 10 MG/ML injection Inject 2 mLs (20 mg total) into the vein daily. 4 mL 0    glucose blood (ONETOUCH ULTRA) test strip USE AS INSTRUCTED UP TO 3 TIMES DAILY.      Glycopyrrolate-Formoterol (BEVESPI AEROSPHERE) 9-4.8 MCG/ACT AERO INHALE 2 PUFFS INTO THE LUNGS TWICE A DAY      heparin 100-0.45 UNIT/ML-% infusion Inject 1,800 Units/hr into the vein continuous. 250 mL 0    insulin regular, human (MYXREDLIN) 100 units/ 100 mL infusion Inject 2 Units/hr into the vein continuous. (Patient not taking: Reported on 12/08/2020) 100 mL 0    ipratropium-albuterol (DUONEB) 0.5-2.5 (3) MG/3ML SOLN Take 3 mLs by nebulization every 6 (six) hours. (Patient not taking: Reported on 12/08/2020) 360 mL 0    methylPREDNISolone sodium succinate (SOLU-MEDROL) 40 mg/mL injection Inject 1 mL (40 mg total) into the vein 2 (two) times daily. (Patient not taking: Reported on 12/08/2020) 1 each 0    metoprolol succinate (TOPROL-XL) 25 MG 24 hr tablet Take 1 tablet by mouth daily.      midazolam 50 mg in sodium chloride 0.9 % 40 mL Inject 0-8 mg/hr into the vein continuous. 40 mL 0    pantoprazole sodium (PROTONIX) 40 mg/20 mL PACK Place 20 mLs (40 mg total) into feeding tube daily. (Patient not taking: Reported on 12/08/2020) 30 each 0    piperacillin-tazobactam (ZOSYN) 3.375 GM/50ML IVPB Inject 50 mLs (3.375 g total) into the vein every 8 (eight) hours. 50 mL 0    polyethylene glycol (MIRALAX / GLYCOLAX) packet Place 17 g into feeding tube daily. (Patient not taking: Reported on 12/08/2020) 14 each 0    potassium chloride (KLOR-CON) 10 MEQ tablet Take by mouth.      sacubitril-valsartan (ENTRESTO) 24-26 MG Take 1 tablet by mouth 2 (two) times daily.      senna-docusate (SENOKOT-S) 8.6-50 MG tablet Place 2 tablets into feeding tube 2 (two)  times daily. (Patient not taking: Reported on 12/08/2020) 2 tablet 0    sodium bicarbonate 150 mEq in sterile water 850 mL Inject 25 mL/hr into the vein continuous. 850 mL 0    spironolactone (ALDACTONE) 25 MG tablet Take 1 tablet by mouth daily.      tadalafil (CIALIS) 20 MG tablet Take by mouth.      testosterone cypionate (DEPOTESTOSTERONE CYPIONATE)  200 MG/ML injection Inject into the muscle.      torsemide (DEMADEX) 20 MG tablet       warfarin (COUMADIN) 5 MG tablet        Patient Stressors: Marital or family conflict Medication change or noncompliance  Patient Strengths: Ability for insight Motivation for treatment/growth  Treatment Modalities: Medication Management, Group therapy, Case management,  1 to 1 session with clinician, Psychoeducation, Recreational therapy.   Physician Treatment Plan for Primary Diagnosis: Severe recurrent major depression without psychotic features ([place]) Long Term Goal(s): Improvement in symptoms so as ready for discharge   Short Term Goals: Ability to identify changes in lifestyle to reduce recurrence of condition will improve Ability to disclose and discuss suicidal ideas Ability to demonstrate self-control will improve Ability to identify and develop effective coping behaviors will improve Ability to maintain clinical measurements within normal limits will improve Ability to identify triggers associated with substance abuse/mental health issues will improve  Medication Management: Evaluate patient's response, side effects, and tolerance of medication regimen.  Therapeutic Interventions: 1 to 1 sessions, Unit Group sessions and Medication administration.  Evaluation of Outcomes: Not Met  Physician Treatment Plan for Secondary Diagnosis: Principal Problem:   Severe recurrent major depression without psychotic features ([place]) Active Problems:   Chronic diastolic heart failure (H[facility])   Cardiac pacemaker in situ   Dyslipidemia   Type 2 diabetes  mellitus with other specified complication ([facility])   Moderate persistent asthma  Long Term Goal(s): Improvement in symptoms so as ready for discharge   Short Term Goals: Ability to identify changes in lifestyle to reduce recurrence of condition will improve Ability to disclose and discuss suicidal ideas Ability to demonstrate self-control will improve Ability to identify and develop effective coping behaviors will improve Ability to maintain clinical measurements within normal limits will improve Ability to identify triggers associated with substance abuse/mental health issues will improve     Medication Management: Evaluate patient's response, side effects, and tolerance of medication regimen.  Therapeutic Interventions: 1 to 1 sessions, Unit Group sessions and Medication administration.  Evaluation of Outcomes: Not Met   RN Treatment Plan for Primary Diagnosis: Severe recurrent major depression without psychotic features ([place]) Long Term Goal(s): Knowledge of disease and therapeutic regimen to maintain health will improve  Short Term Goals: Ability to remain free from injury will improve, Ability to verbalize frustration and anger appropriately will improve, Ability to demonstrate self-control, Ability to participate in decision making will improve, Ability to verbalize feelings will improve, Ability to disclose and discuss suicidal ideas, and Ability to identify and develop effective coping behaviors will improve  Medication Management: RN will administer medications as ordered by provider, will assess and evaluate patient's response and provide education to patient for prescribed medication. RN will report any adverse and/or side effects to prescribing provider.  Therapeutic Interventions: 1 on 1 counseling sessions, Psychoeducation, Medication administration, Evaluate responses to treatment, Monitor vital signs and CBGs as ordered, Perform/monitor CIWA, COWS, AIMS and Fall Risk screenings  as ordered, Perform wound care treatments as ordered.  Evaluation of Outcomes: Not Met   LCSW Treatment Plan for Primary Diagnosis: Severe recurrent major depression without psychotic features ([place]) Long Term Goal(s): Safe transition to appropriate next level of care at discharge, Engage patient in therapeutic group addressing interpersonal concerns.  Short Term Goals: Engage patient in aftercare planning with referrals and resources, Increase social support, Increase ability to appropriately verbalize feelings, Increase emotional regulation, Facilitate acceptance of mental health diagnosis and concerns, Facilitate patient progression  through stages of change regarding substance use diagnoses and concerns, Identify triggers associated with mental health/substance abuse issues, and Increase skills for wellness and recovery  Therapeutic Interventions: Assess for all discharge needs, 1 to 1 time with Social worker, Explore available resources and support systems, Assess for adequacy in community support network, Educate family and significant other(s) on suicide prevention, Complete Psychosocial Assessment, Interpersonal group therapy.  Evaluation of Outcomes: Not Met   Progress in Treatment: Attending groups: No. Participating in groups: No. Taking medication as prescribed: Yes. Toleration medication: Yes. Family/Significant other contact made: Yes, individual(s) contacted:  pt's ex-fiance Patient understands diagnosis: Yes. Discussing patient identified problems/goals with staff: Yes. Medical problems stabilized or resolved: Yes. Denies suicidal/homicidal ideation: Yes. Issues/concerns per patient self-inventory: No. Other: None  New problem(s) identified: No, Describe:  None  New Short Term/Long Term Goal(s): detox, elimination of symptoms of psychosis, medication management for mood stabilization; elimination of SI thoughts; development of comprehensive mental wellness/sobriety  plan.  Patient Goals:  "to make myself a better person for my family..be more happy, and understanding, and use more coping skills."  Discharge Plan or Barriers: CSW will assist pt with appropriate discharge plan  Reason for Continuation of Hospitalization: Depression Medication stabilization  Estimated Length of Stay: 1-7 days  Attendees: Patient: Nicholas Horton 01/19/2021 10:00 AM  Physician: Salley Scarlet, MD  01/19/2021 10:00 AM  Nursing: Abigail Miyamoto 01/19/2021 10:00 AM  [place]: 01/19/2021 10:00 AM  Social Worker: Annaliah Rivenbark [place], MSW, [place]  01/19/2021 10:00 AM  Recreational Therapist:  01/19/2021 10:00 AM  Other: Michell Heinrich, MSW, LCSW, LCAS 01/19/2021 10:00 AM  Other:  01/19/2021 10:00 AM  Other: 01/19/2021 10:00 AM    Scribe for Treatment Team: Bentli Llorente A [place], [place] 01/19/2021 10:00 AM

## 2021-01-20 DIAGNOSIS — F332 Major depressive disorder, recurrent severe without psychotic features: Secondary | ICD-10-CM | POA: Diagnosis not present

## 2021-01-20 NOTE — Plan of Care (Signed)
D: Patient alert and oriented to person, place, time and situation. Denies SI/HI/AH/VH. Reports anxiety and depression. See pain assessment for details. Minimal interaction with peers but is present on milieu.   A: Takes all medications as prescribed. Supported emotionally on milieu.   R: No adverse reactions to medications. Will continue q15 minute safety checks Problem: Education: Goal: Knowledge of [facility] Education information/materials will improve Outcome: Progressing Goal: Emotional status will improve Outcome: Progressing Goal: Mental status will improve Outcome: Progressing Goal: Verbalization of understanding the information provided will improve Outcome: Progressing   Problem: Safety: Goal: Periods of time without injury will increase Outcome: Progressing   Problem: Education: Goal: Ability to make informed decisions regarding treatment will improve Outcome: Progressing   Problem: Medication: Goal: Compliance with prescribed medication regimen will improve Outcome: Progressing

## 2021-01-20 NOTE — Progress Notes (Signed)
Patient alert and oriented x 4 affect is blunted thoughts are organized he denies SI/HI/AVH, he was complaint with medication no distress noted.

## 2021-01-20 NOTE — BHH Group Notes (Signed)
LCSW Group Therapy Note  01/20/2021 3:37 PM  Type of Therapy/Topic:  Group Therapy:  Balance in Life  Participation Level:  Active  Description of Group:    This group will address the concept of balance and how it feels and looks when one is unbalanced. Patients will be encouraged to process areas in their lives that are out of balance and identify reasons for remaining unbalanced. Facilitators will guide patients in utilizing problem-solving interventions to address and correct the stressor making their life unbalanced. Understanding and applying boundaries will be explored and addressed for obtaining and maintaining a balanced life. Patients will be encouraged to explore ways to assertively make their unbalanced needs known to significant others in their lives, using other group members and facilitator for support and feedback.  Therapeutic Goals: Patient will identify two or more emotions or situations they have that consume much of in their lives. Patient will identify signs/triggers that life has become out of balance:  Patient will identify two ways to set boundaries in order to achieve balance in their lives:  Patient will demonstrate ability to communicate their needs through discussion and/or role plays  Summary of Patient Progress: Patient was present and active in group.  Patient was attentive to others.  Patient shared that balance to him means "happiness".  He indicated that when he is feeling unbalanced he feels "anger".  He was able to identify the following triggers "death, specifically for me the death of my child at birth". He shared that he thinks of happier times and repeats to himself that it was not his fault the child died as a way to increase coping.  He identified that taking walks in another coping skill that he utilizes.    Therapeutic Modalities:   Cognitive Behavioral Therapy Solution-Focused Therapy Assertiveness Training  Penni Homans MSW, [place] 01/20/2021  3:37 PM

## 2021-01-20 NOTE — Progress Notes (Signed)
Recreation Therapy Notes  INPATIENT RECREATION TR PLAN  Patient Details Name: Nicholas Horton MRN: [id number] DOB: 1975/07/10 Today's Date: 01/20/2021  Rec Therapy Plan Is patient appropriate for Therapeutic Recreation?: Yes Treatment times per week: at least 3 Estimated Length of Stay: 5-7 days TR Treatment/Interventions: Community reintegration  Discharge Criteria Pt will be discharged from therapy if:: Discharged Treatment plan/goals/alternatives discussed and agreed upon by:: Patient/family  Discharge Summary     Cherrill Scrima 01/20/2021, 1:27 PM

## 2021-01-20 NOTE — Progress Notes (Signed)
Recreation Therapy Notes  Date: 01/13/2021  Time: 9:40 am  Location: Courtyard   Behavioral response: Appropriate   Intervention Topic: Animal Assisted Therapy   Discussion/Intervention:  Animal Assisted Therapy took place today during group.  Animal Assisted Therapy is the planned inclusion of an animal in a patient's treatment plan. The patients were able to engage in therapy with an animal during group. Participants were educated on what a service dog is and the different between a support dog and a service dog. Patient were informed on how animal needs are similar to people needs. Individuals were enlightened on the process to get a service animal or support animal. Patients got the opportunity to pet the animal and were offered emotional support from the animal and staff.  Clinical Observations/Feedback:  Patient came to group and was on topic and was focused on what peers and staff had to say. Participant shared their experiences and history with animals. Individual was social with peers, staff and animal while participating in group.  Birt Reinoso LRT/CTRS          Triston Lisanti 01/20/2021 12:15 PM

## 2021-01-20 NOTE — BHH Counselor (Signed)
CSW spoke with pt about contacting care manager from his insurance company. He agreed.   CSW contacted Chelsie regarding aftercare. She was informed that pt's estimated discharge date is tomorrow and that he plans to follow up with outpatient care. She was informed that he is interested in therapy and medication management. CSW asked for listing of providers which accept insurance. She agreed to send this to CSW. Chelsie asked to speak to pt. CSW agreed and informed her that resource list had been received. No other concerns expressed. Contact ended without incident. CSW facilitated connecting Chelsie with pt.   CSW gave pt resource list for review. He stated that Chelsie wanted her contact information to be given to him at discharge. CSW agreed. Pt and CSW discussed aftercare. He stated that he would like to keep it in the [place] area and would like the first available. CSW agreed. No other concerns expressed. Contact ended without incident.   Vilma Meckel. Algis Greenhouse, MSW, LCSW, LCAS 01/20/2021 4:12 PM

## 2021-01-20 NOTE — Progress Notes (Signed)
Recreation Therapy Notes  INPATIENT RECREATION THERAPY ASSESSMENT  Patient Details Name: Nicholas Horton MRN: [id number] DOB: 27-Apr-1976 Today's Date: 01/20/2021       Information Obtained From: Patient  Able to Participate in Assessment/Interview: Yes  Patient Presentation: Responsive  Reason for Admission (Per Patient): Active Symptoms, Suicidal Ideation  Patient Stressors: Relationship  Coping Skills:   Isolation, Avoidance, Other (Comment) (computer games)  Leisure Interests (2+):  Games - Clinical cytogeneticist games Administrator, Civil [employer])  Frequency of Recreation/Participation: Monthly  Awareness of Community Resources:  Yes  Community Resources:  Allegra Grana, Other (Comment) ([facility])  Current Use: No  If no, Barriers?: Attitudinal  Expressed Interest in [employer] Information: Yes  County of Residence:  [place]  Patient Main Form of Transportation: Car  Patient Strengths:  N/A  Patient Identified Areas of Improvement:  Be a better person  Patient Goal for Hospitalization:  Feel better  Current SI (including self-harm):  No  Current HI:  No  Current AVH: No  Staff Intervention Plan: Group Attendance, Collaborate with Interdisciplinary Treatment Team  Consent to Intern Participation: N/A  Rubel Heckard 01/20/2021, 1:25 PM
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
## 2021-01-21 DIAGNOSIS — F332 Major depressive disorder, recurrent severe without psychotic features: Secondary | ICD-10-CM | POA: Diagnosis not present

## 2021-01-21 MED ORDER — SPIRONOLACTONE 25 MG PO TABS: 25.0000 mg | ORAL_TABLET | Freq: Every day | ORAL | 1 refills | Status: AC

## 2021-01-21 MED ORDER — ARIPIPRAZOLE 10 MG PO TABS: 10.0000 mg | ORAL_TABLET | Freq: Every day | ORAL | 1 refills | Status: AC

## 2021-01-21 MED ORDER — FUROSEMIDE 20 MG PO TABS: 20.0000 mg | ORAL_TABLET | Freq: Every day | ORAL | 1 refills | Status: AC

## 2021-01-21 MED ORDER — DILTIAZEM HCL ER COATED BEADS 120 MG PO CP24: 120.0000 mg | ORAL_CAPSULE | Freq: Every day | ORAL | 1 refills | Status: AC

## 2021-01-21 MED ORDER — BUPROPION HCL ER (SR) 150 MG PO TB12: 450.0000 mg | ORAL_TABLET | Freq: Every day | ORAL | 1 refills | Status: AC

## 2021-01-21 NOTE — Plan of Care (Signed)
  Problem: Coping Skills Goal: STG - Patient will identify 3 positive coping skills strategies to use post d/c within 5 recreation therapy group sessions Description: STG - Patient will identify 3 positive coping skills strategies to use post d/c within 5 recreation therapy group sessions Outcome: Completed/Met

## 2021-01-21 NOTE — Progress Notes (Signed)
Recreation Therapy Notes  Date: 01/21/2021  Time: 9:30 am   Location: Court [place]  Behavioral response: Appropriate  Intervention Topic: Leisure    Discussion/Intervention:  Group content today was focused on leisure. The group defined what leisure is and some positive leisure activities they participate in. Individuals identified the difference between good and bad leisure. Participants expressed how they feel after participating in the leisure of their choice. The group discussed how they go about picking a leisure activity and if others are involved in their leisure activities. The patient stated how many leisure activities they have to choose from and reasons why it is important to have leisure time. Individuals participated in the intervention "Exploration of Leisure" where they had a chance to identify new leisure activities as well as benefits of leisure. Clinical Observations/Feedback: Patient came to group and was social with peers and staff while actively participating in leisure.  Herschell Virani LRT/CTRS         Nabiha Planck 01/21/2021 11:27 AM

## 2021-01-21 NOTE — Progress Notes (Signed)
Patient alert and oriented x 4 affect is blunted thoughts are organized and coherent, he was noted in milieu but minimal interaction with peers and staff. Patient currently denies  SI/HI/AVH, he was compliant with medication regimen, no distress noted will continue to monitor closely.

## 2021-01-21 NOTE — Progress Notes (Signed)
Patient ID: Nicholas Horton, male   DOB: 07/03/1975, 45 y.o.   MRN: [id number]   Discharge instructions reviewed with patient and patient verbalized understandings. Patient has all belongings at d c'd home with family. Denies SI/HI/AH/VH at discharge. No apparent distress.

## 2021-01-21 NOTE — BHH Counselor (Signed)
CSW met with pt briefly to discuss discharge/aftercare plans. He stated that he has a ride home and clothes to wear. CSW informed him that calls would be made to providers to find him an appointment. He agreed. CSW shared an authorization was needed in case they requested/needed any information. Pt agreed and authorization was signed. No other concerns expressed. Contact ended without incident.   CSW contacted the list provided by the [facility] Case Manager, [employer]. See results below:  [facility] contacted at 9:10 AM. They do not provide psychiatric services  [place], Dr. Venancio Poisson at 9:15 AM. Voicemail left. Informed at 9:36 AM that they are not taking any new pts.  [facility] left. Informed at 11:47 AM that they are not taking any new referrals at the moment.   [place] at 9:27 AM. Call unable to be completed at that time.   [place]. At 9:29 AM. Call unable to be completed at that time.   [facility] at 9:36 AM. Informed they only provide [place].   Dr. Lestine Box at 9:42 AM: Voicemail left.  [place] and [place] at 9:45 AM. Was informed that they does not provide psych services.  Baird Kay, MSW at 9:52 AM. Mailbox was full and unable to leave message.  [facility] at 9:54 AM. Does not accept [place] at 9:55 AM. Does not accept [place] at 9:56 AM. Was informed that pt would need to call the first week in September to be setup with appointment to see PCP who would do referral to counselor there. Also confirmed that they do psychiatric medication management.   Milo Stogner, MSW at 10:00 AM. Was informed provider is retired and has been for years.  Girtha Rm, MSW at 10:17 AM. Voicemail  left.  Ander Slade, PA-C at 10:21 AM. Informed they do not provide services to people who are directly out of the the hospital.   CSW followed up with pt regarding the information for [facility]. He agreed to contact them. No other concerns expressed. Contact ended without incident.   CSW contacted Toula Moos ([place] [phone]) to inform of aftercare plans. [place] Crane was undated and requested pt be given her information. No other concerns expressed. Contact ended without incident. CSW attached [facility] contact information to pt discharge paperwork.  Chalmers Guest. Guerry Bruin, MSW, [place], [place] 01/21/2021 12:02 PM

## 2021-01-21 NOTE — Progress Notes (Signed)
Recreation Therapy Notes  INPATIENT RECREATION TR PLAN  Patient Details Name: Lovelace Cerveny MRN: [id number] DOB: 1975/07/09 Today's Date: 01/21/2021  Rec Therapy Plan Is patient appropriate for Therapeutic Recreation?: Yes Treatment times per week: at least 3 Estimated Length of Stay: 5-7 days TR Treatment/Interventions: Community reintegration  Discharge Criteria Pt will be discharged from therapy if:: Discharged Treatment plan/goals/alternatives discussed and agreed upon by:: Patient/family  Discharge Summary Short term goals set: Patient will identify 3 positive coping skills strategies to use post d/c within 5 recreation therapy group sessions Short term goals met: Complete Progress toward goals comments: Groups attended Which groups?: Leisure education, AAA/T, Coping skills Reason goals not met: N/A Therapeutic equipment acquired: N/A Reason patient discharged from therapy: Discharge from hospital Pt/family agrees with progress & goals achieved: Yes Date patient discharged from therapy: 01/21/21   Maddyx Wieck 01/21/2021, 11:42 AM
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
## 2021-01-21 NOTE — Discharge Summary (Signed)
Physician Discharge Summary Note  Patient:  Nicholas Horton is an 45 y.o., male MRN:  [id number] DOB:  1976/02/28 Patient phone:  [phone] (home)  Patient address:   [address] [name] [id number],  Total Time spent with patient: 35 minutes- 25 minutes face-to-face contact with patient, 10 minutes documentation, coordination of care, scripts   Date of Admission:  01/17/2021 Date of Discharge: 01/21/2021  Reason for Admission:  Worsening depression and suicidal ideations  Principal Problem: Severe recurrent major depression without psychotic features [facility](HCC) Discharge Diagnoses: Principal Problem:   Severe recurrent major depression without psychotic features (HCC) Active Problems:   Chronic diastolic heart failure (HCC)   Cardiac pacemaker in situ   Dyslipidemia   Type 2 diabetes mellitus with other specified complication (HCC)   Moderate persistent asthma   Past Psychiatric History: : Patient denies previous suicide attempts.  States that he has been psychiatrically hospitalized before many years ago.  [facility] psychiatrist for medication management [facility].  Has seen a therapist in the past but does not have current connection.  Past Medical History:  Past Medical History:  Diagnosis Date   CHF (congestive heart failure) (HCC)    Congenital bicuspid aortic valve 09/2014   - s/p AVR   Nonischemic cardiomyopathy (HCC) -- Resolved[I42.9] 09/2014   EF by Echo 20-25% (pre-op AVR) --> Echo 10/2014 & 03/2015: EF 50-55% - also Recent Normal Diastolic parameters    S/P AVR (aortic valve replacement) 10/2014   21 mm Carbometrics Mechanical Valve -- Epicardial PPM Leads   Severe aortic stenosis 10/04/2014   Presented with Syncope & CHF    Past Surgical History:  Procedure Laterality Date   AORTIC VALVE REPLACEMENT N/A 12/01/2014   Procedure: AORTIC VALVE REPLACEMENT (AVR);  Surgeon: Kerin PernaPeter Van Trigt, MD;  [facility];  Service: Open Heart Surgery;  Laterality:  N/A;   CARDIAC CATHETERIZATION N/A 10/04/2014   Procedure: Left Heart Cath and Coronary Angiography;  Surgeon: Marykay Lexavid W Harding, MD;  [facility];  Service: Cardiovascular;  Laterality: N/A;   CARDIAC CATHETERIZATION  10/04/2014   Procedure: Temporary Pacemaker;  Surgeon: Marykay Lexavid W Harding, MD;  [facility];  Service: Cardiovascular;;   CARDIAC CATHETERIZATION N/A 11/25/2014   Procedure: Temporary Pacemaker;  Surgeon: Marinus MawGregg W Taylor, MD;  [facility];  Service: Cardiovascular;  Laterality: N/A;   MULTIPLE EXTRACTIONS WITH ALVEOLOPLASTY N/A 11/25/2014   Procedure: Extraction of tooth #'s 1,2,3,4,5,6,7,9, 10, 11, 12,13,14,16, 19, 20, 21, 22, 23, 24, 25, 26, 27, 28, 29, 30, 31 with alveoloplasty;  Surgeon: Charlynne Panderonald F Kulinski, DDS;  Location: [facility] OR;  Service: Oral Surgery;  Laterality: N/A;   SURGERY SCROTAL / TESTICULAR     for undescended testicle as a child   TEE WITHOUT CARDIOVERSION N/A 12/01/2014   Procedure: TRANSESOPHAGEAL ECHOCARDIOGRAM (TEE);  Surgeon: Kerin PernaPeter Van Trigt, MD;  [facility];  Service: Open Heart Surgery;  Laterality: N/A;   TRANSTHORACIC ECHOCARDIOGRAM  03/2015   Mildly dilated LV. EF 50 at 55%. Normal diastolic parameters. Mild to moderate stenosis of prosthetic aortic valve. Mild to moderate regurgitation   TRANSTHORACIC ECHOCARDIOGRAM  08/2017   Over-read - EF ~50% with PPM dyssynchrony.  Mild AS.  mean gradient 24 mmHg.    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  Tobacco Use   Smoking status: Former    Packs/day: 2.00    Years: 20.00    Pack years: 40.00    Types: Cigarettes   Smokeless tobacco: Former   Tobacco comments:    quit 10/04/14  Substance and Sexual Activity   Alcohol use: No    Alcohol/week: 0.0 standard drinks    Comment: rarely   Drug use: No   Sexual activity: Yes  Other Topics Concern   Not on file  Social History Narrative   Lives in [place], [place] with wife and daughter.    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    Social Determinants of Health   Financial Resource Strain: Not on file  Food Insecurity: Not on file  Transportation Needs: Not on file  Physical Activity: Not on file  Stress: Not on file  Social Connections: Not on file    Hospital Course:  Patient admitted for worsening depression and suicidal ideations. While here his Wellbutrin was increased to 450 mg daily, and Abilify increased to 10 mg daily. He attended groups, and interacted appropriately with staff and peers. Mood gradually improved, and he denies any suicidal ideations, homicidal ideations, visual hallucinations, or auditory hallucinations. He plans to return home with his daughter, and continue outpatient mental health treatment.   Physical Findings: AIMS: Facial and Oral Movements Muscles of Facial Expression: None, normal Lips and Perioral Area: None, normal Jaw: None, normal Tongue: None, normal,Extremity Movements Upper (arms, wrists, hands, fingers): None, normal Lower (legs, knees, ankles, toes): None, normal, Trunk Movements Neck, shoulders, hips: None, normal, Overall Severity Severity of abnormal movements (highest score from questions above): None, normal Incapacitation due to abnormal movements: None, normal Patient's awareness of abnormal movements (rate only patient's report): No  Awareness, Dental Status Current problems with teeth and/or dentures?: No Does patient usually wear dentures?: No  CIWA:    COWS:     Musculoskeletal: Strength & Muscle Tone: within normal limits Gait & Station: normal Patient leans: N/A   Psychiatric Specialty Exam:  Presentation  General Appearance: Appropriate for Environment; Casual  Eye Contact:Good  Speech:Clear and Coherent; Normal Rate  Speech Volume:Normal  Handedness:Right   Mood and Affect  Mood:Euphoric  Affect:Congruent   Thought Process  Thought Processes:Coherent; Goal Directed  Descriptions of Associations:Intact  Orientation:Full (Time, Place and Person)  Thought Content:Logical  History of Schizophrenia/Schizoaffective disorder:No  Duration of Psychotic Symptoms:No data recorded Hallucinations:Hallucinations: None  Ideas of Reference:None  Suicidal Thoughts:Suicidal Thoughts: No  Homicidal Thoughts:Homicidal Thoughts: No   Sensorium  Memory:Immediate Good; Recent Good; Remote Good  Judgment:Fair  Insight:Good   Executive Functions  Concentration:Good  Attention Span:Good  Recall:Good  Fund of Knowledge:Good  Language:Good   Psychomotor Activity  Psychomotor Activity:Psychomotor Activity: Normal   Assets  Assets:Communication Skills; Desire for Improvement; Financial Resources/Insurance; Housing; Intimacy; Resilience; Social Support; Talents/Skills; Transportation   Sleep  Sleep:Sleep: Good Number of Hours of Sleep: 7.5    Physical  Exam: Physical Exam Vitals and nursing note reviewed.  Constitutional:      Appearance: Normal appearance.  HENT:     Head: Normocephalic and atraumatic.     Right Ear: External ear normal.     Left Ear: External ear normal.     Nose: Nose normal.     Mouth/Throat:     Mouth: Mucous membranes are dry.  Eyes:     Extraocular Movements: Extraocular movements intact.     Pupils: Pupils are equal, round, and reactive to light.   Cardiovascular:     Rate and Rhythm: Normal rate.     Pulses: Normal pulses.  Pulmonary:     Effort: Pulmonary effort is normal.     Breath sounds: Normal breath sounds.  Abdominal:     General: Abdomen is flat.     Palpations: Abdomen is soft.  Musculoskeletal:        General: No swelling. Normal range of motion.     Cervical back: Normal range of motion and neck supple.  Skin:    General: Skin is warm and dry.  Neurological:     General: No focal deficit present.     Mental Status: He is alert and oriented to person, place, and time.  Psychiatric:        Mood and Affect: Mood normal.        Behavior: Behavior normal.        Thought Content: Thought content normal.        Judgment: Judgment normal.   Review of Systems  Constitutional: Negative.   HENT: Negative.    Eyes: Negative.   Respiratory: Negative.    Cardiovascular: Negative.   Gastrointestinal: Negative.   Genitourinary: Negative.   Musculoskeletal: Negative.   Skin: Negative.   Neurological: Negative.   Endo/Heme/Allergies:  Positive for environmental allergies. Does not bruise/bleed easily.  Psychiatric/Behavioral:  Negative for hallucinations, memory loss, substance abuse and suicidal ideas. The patient is not nervous/anxious and does not have insomnia.   Blood pressure 130/78, pulse (!) 59, temperature (!) 97.5 F (36.4 C), temperature source Oral, resp. rate 18, height 6\' 2"  (1.88 m), weight 107.5 kg, SpO2 95 %. Body mass index is 30.43 kg/m.   Social History   Tobacco Use  Smoking Status Former   Packs/day: 2.00   Years: 20.00   Pack years: 40.00   Types: Cigarettes  Smokeless Tobacco Former  Tobacco Comments   quit 10/04/14   Tobacco Cessation:  N/A, patient does not currently use tobacco products   Blood Alcohol level:  Lab Results  Component Value Date   ETH <10 01/17/2021   ETH <10 11/20/2017    Metabolic Disorder Labs:  Lab Results  Component Value Date   HGBA1C 5.8 (H) 02/28/2018    MPG 120 02/28/2018   MPG 148.46 11/21/2017   No results found for: PROLACTIN Lab Results  Component Value Date   CHOL 157 01/17/2021   TRIG 312 (H) 01/17/2021   HDL 35 (L) 01/17/2021   CHOLHDL 4.5 01/17/2021   VLDL 62 (H) 01/17/2021   LDLCALC 60 01/17/2021   LDLCALC 89 11/22/2014    See Psychiatric Specialty Exam and Suicide Risk Assessment completed by Attending Physician prior to discharge.  Discharge destination:  Home  Is patient on multiple antipsychotic therapies at discharge:  No   Has Patient had three or more failed trials of antipsychotic monotherapy by history:  No  Recommended Plan for Multiple Antipsychotic Therapies: NA  Discharge Instructions  Diet - low sodium heart healthy   Complete by: As directed    Increase activity slowly   Complete by: As directed       Allergies as of 01/21/2021       Reactions   Other    No NSAIDS r/t blood thinners        Medication List     STOP taking these medications    cisatracurium 200 mg in sodium chloride 0.9 % 180 mL   cyclobenzaprine 5 MG tablet Commonly known as: FLEXERIL   dextrose 10 % infusion   Entresto 24-26 MG Generic drug: sacubitril-valsartan   escitalopram 10 MG tablet Commonly known as: LEXAPRO   fentaNYL 10 mcg/ml Soln infusion   Flovent Diskus 100 MCG/BLIST Aepb Generic drug: Fluticasone Propionate (Inhal)   furosemide 10 MG/ML injection Commonly known as: LASIX Replaced by: furosemide 20 MG tablet   heparin 100-0.45 UNIT/ML-% infusion   Insulin Regular(Human) in NaCl 100-0.9 UT/100ML-% Soln infusion Commonly known as: MYXREDLIN   ipratropium-albuterol 0.5-2.5 (3) MG/3ML Soln Commonly known as: DUONEB   methylPREDNISolone sodium succinate 40 mg/mL injection Commonly known as: SOLU-MEDROL   metoprolol succinate 25 MG 24 hr tablet Commonly known as: TOPROL-XL   midazolam 50 mg in sodium chloride 0.9 % 40 mL   OneTouch Ultra test strip Generic drug: glucose  blood   piperacillin-tazobactam 3.375 GM/50ML IVPB Commonly known as: ZOSYN   potassium chloride 10 MEQ tablet Commonly known as: KLOR-CON   sodium bicarbonate 150 mEq in sterile water 850 mL   testosterone cypionate 200 MG/ML injection Commonly known as: DEPOTESTOSTERONE CYPIONATE   torsemide 20 MG tablet Commonly known as: DEMADEX   warfarin 5 MG tablet Commonly known as: COUMADIN       TAKE these medications      Indication  albuterol 108 (90 Base) MCG/ACT inhaler Commonly known as: VENTOLIN HFA Inhale into the lungs.  Indication: Asthma   ARIPiprazole 10 MG tablet Commonly known as: ABILIFY Take 1 tablet (10 mg total) by mouth daily. Start taking on: January 22, 2021 What changed:  medication strength how much to take when to take this  Indication: Major Depressive Disorder   artificial tears Oint ophthalmic ointment Commonly known as: LACRILUBE Place 1 application into both eyes every 8 (eight) hours.  Indication: dry eyes   aspirin 81 MG chewable tablet Place 1 tablet (81 mg total) into feeding tube daily.  Indication: Disease involving Lipid Deposits in the Arteries   Bevespi Aerosphere 9-4.8 MCG/ACT Aero Generic drug: Glycopyrrolate-Formoterol INHALE 2 PUFFS INTO THE LUNGS TWICE A DAY  Indication: Chronic Obstructive Lung Disease   buPROPion 150 MG 12 hr tablet Commonly known as: WELLBUTRIN SR Take 3 tablets (450 mg total) by mouth daily. Start taking on: January 22, 2021 What changed:  medication strength how much to take  Indication: Major Depressive Disorder   dapagliflozin propanediol 10 MG Tabs tablet Commonly known as: FARXIGA Take 1 tablet by mouth daily.  Indication: Cardiac Failure   diltiazem 120 MG 24 hr capsule Commonly known as: CARDIZEM CD Take 1 capsule (120 mg total) by mouth daily. OFFICE VISIT NEEDED  Indication: High Blood Pressure Disorder   furosemide 20 MG tablet Commonly known as: LASIX Take 1 tablet (20 mg total)  by mouth daily. Start taking on: January 22, 2021 Replaces: furosemide 10 MG/ML injection  Indication: Cardiac Failure, High Blood Pressure Disorder   pantoprazole sodium 40 mg Pack Commonly known as: PROTONIX Place 20 mLs (40 mg total) into feeding  tube daily.  Indication: Gastroesophageal Reflux Disease   polyethylene glycol 17 g packet Commonly known as: MIRALAX / GLYCOLAX Place 17 g into feeding tube daily.  Indication: Constipation   senna-docusate 8.6-50 MG tablet Commonly known as: Senokot-S Place 2 tablets into feeding tube 2 (two) times daily.  Indication: Constipation   spironolactone 25 MG tablet Commonly known as: ALDACTONE Take 1 tablet (25 mg total) by mouth daily.  Indication: High Blood Pressure Disorder   tadalafil 20 MG tablet Commonly known as: CIALIS Take by mouth.  Indication: Erectile Dysfunction         Follow-up recommendations:  Activity:  as tolerated Diet:  low sodium heart healhty diet  Comments:  30-day scripts with 1 refill sent to [facility] in [place] per patient request.   Signed: Jesse Sans, MD 01/21/2021, 9:16 AM

## 2021-01-24 ENCOUNTER — Other Ambulatory Visit: Payer: Self-pay

## 2021-01-24 DIAGNOSIS — Z79890 Hormone replacement therapy (postmenopausal): Secondary | ICD-10-CM | POA: Diagnosis not present

## 2021-01-24 DIAGNOSIS — Z7982 Long term (current) use of aspirin: Secondary | ICD-10-CM | POA: Diagnosis not present

## 2021-01-24 DIAGNOSIS — I5022 Chronic systolic (congestive) heart failure: Secondary | ICD-10-CM | POA: Diagnosis not present

## 2021-01-24 DIAGNOSIS — Z794 Long term (current) use of insulin: Secondary | ICD-10-CM | POA: Diagnosis not present

## 2021-01-24 DIAGNOSIS — Z79899 Other long term (current) drug therapy: Secondary | ICD-10-CM | POA: Diagnosis not present

## 2021-01-24 DIAGNOSIS — Z7901 Long term (current) use of anticoagulants: Secondary | ICD-10-CM | POA: Diagnosis not present

## 2021-01-24 LAB — GLUCOSE, CAPILLARY
Glucose-Capillary: 94 mg/dL (ref 70–99)
Glucose-Capillary: 113 mg/dL — ABNORMAL HIGH (ref 70–99)

## 2021-01-24 NOTE — Progress Notes (Signed)
Daily Session Note  Patient Details  Name: Nicholas Horton MRN: [id number] Date of Birth: 01/03/1976 Referring Provider:   Flowsheet Row Cardiac Rehab from 01/13/2021 in [facility] Cardiac and Pul[facility] Rehab  Referring Provider Newman Pies       Encounter Date: 01/24/2021  Check In:  Session Check In - 01/24/21 1616       Check-In   Supervising physician immediately available to respond to emergencies See telemetry face sheet for immediately available ER MD    Location [facility]-Cardiac & Pulmonary Rehab    Staff Present Birdie Sons, MPA, Nino Glow, MS, ASCM CEP, Exercise Physiologist;Joseph Tessie Fass, Cristopher Estimable, RN BSN    Virtual Visit No    Medication changes reported     No    Fall or balance concerns reported    No    Warm-up and Cool-down Performed on first and last piece of equipment    Resistance Training Performed Yes    VAD Patient? No    PAD/SET Patient? No      Pain Assessment   Currently in Pain? No/denies                Social History   Tobacco Use  Smoking Status Former   Packs/day: 2.00   Years: 20.00   Pack years: 40.00   Types: Cigarettes  Smokeless Tobacco Former  Tobacco Comments   quit 10/04/14    Goals Met:  Independence with exercise equipment Exercise tolerated well No report of concerns or symptoms today Strength training completed today  Goals Unmet:  Not Applicable  Comments: First full day of exercise!  Patient was oriented to gym and equipment including functions, settings, policies, and procedures.  Patient's individual exercise prescription and treatment plan were reviewed.  All starting workloads were established based on the results of the 6 minute walk test done at initial orientation visit.  The plan for exercise progression was also introduced and progression will be customized based on patient's performance and goals.    Dr. Emily Filbert is Medical Director for [facility].  Dr. Ottie Glazier is Medical Director for [facility] Pulmonary Rehabilitation.

## 2021-01-26 ENCOUNTER — Other Ambulatory Visit: Payer: Self-pay

## 2021-01-26 DIAGNOSIS — I5022 Chronic systolic (congestive) heart failure: Secondary | ICD-10-CM

## 2021-01-26 LAB — GLUCOSE, CAPILLARY
Glucose-Capillary: 96 mg/dL (ref 70–99)
Glucose-Capillary: 83 mg/dL (ref 70–99)

## 2021-01-26 NOTE — Progress Notes (Signed)
Daily Session Note  Patient Details  Name: Nicholas Horton MRN: [id number] Date of Birth: 06/06/1975 Referring Provider:   Flowsheet Row Cardiac Rehab from 01/13/2021 in [facility] Cardiac and Pulmonary Rehab  Referring Provider Newman Pies       Encounter Date: 01/26/2021  Check In:  Session Check In - 01/26/21 1030       Check-In   Supervising physician immediately available to respond to emergencies See telemetry face sheet for immediately available ER MD    Location [facility]Card[facility] Rehab    Staff Present Birdie Sons, MPA, Elveria Rising, BA, ACSM CEP, Exercise Physiologist;Joseph Tessie Fass, [place]    Virtual Visit No    Medication changes reported     No    Fall or balance concerns reported    No    Warm-up and Cool-down Performed on first and last piece of equipment    Resistance Training Performed Yes    VAD Patient? No    PAD/SET Patient? No      Pain Assessment   Currently in Pain? No/denies                Social History   Tobacco Use  Smoking Status Former   Packs/day: 2.00   Years: 20.00   Pack years: 40.00   Types: Cigarettes  Smokeless Tobacco Former  Tobacco Comments   quit 10/04/14    Goals Met:  Independence with exercise equipment Exercise tolerated well No report of concerns or symptoms today Strength training completed today  Goals Unmet:  Not Applicable  Comments: Pt able to follow exercise prescription today without complaint.  Will continue to monitor for progression.    Dr. Emily Filbert is Medical Director for [facility].  Dr. Ottie Glazier is Medical Director for [facility] Pulmonary Rehabilitation.

## 2021-01-27 ENCOUNTER — Other Ambulatory Visit: Payer: Self-pay

## 2021-01-27 ENCOUNTER — Encounter: Payer: Medicare HMO | Attending: Cardiology | Admitting: *Deleted

## 2021-01-27 DIAGNOSIS — I5022 Chronic systolic (congestive) heart failure: Secondary | ICD-10-CM | POA: Insufficient documentation

## 2021-01-27 DIAGNOSIS — Z79899 Other long term (current) drug therapy: Secondary | ICD-10-CM | POA: Diagnosis not present

## 2021-01-27 DIAGNOSIS — Z87891 Personal history of nicotine dependence: Secondary | ICD-10-CM | POA: Diagnosis not present

## 2021-01-27 LAB — GLUCOSE, CAPILLARY
Glucose-Capillary: 66 mg/dL — ABNORMAL LOW (ref 70–99)
Glucose-Capillary: 72 mg/dL (ref 70–99)
Glucose-Capillary: 87 mg/dL (ref 70–99)

## 2021-01-27 NOTE — Progress Notes (Signed)
Incomplete Session Note  Patient Details  Name: Nicholas Horton MRN: [id number] Date of Birth: [birth year] Referring Provider:   Flowsheet Row Cardiac Rehab from 01/13/2021 in [facility] Cardiac and Pulmonary Rehab  Referring Provider Margaretha Glassing       Encounter Date: 01/27/2021   Patient unable to exercise today due to asymptomatic low blood glucose level (72). Patient given 15g of glucose and apple sauce. Recheck 87. He was provided education by Efraim Kaufmann, RD, to eat when he gets home and to eat (carbs, fat, & high protein) prior to coming in to exercise. Patient stated understanding with no further questions.   Dr. Bethann Punches is Medical Director for [facility] Cardiac Rehabilitation.  Dr. Vida Rigger is Medical Director for [facility] Pulmonary Rehabilitation.

## 2021-01-28 ENCOUNTER — Encounter: Payer: Medicare HMO | Admitting: *Deleted

## 2021-01-28 ENCOUNTER — Other Ambulatory Visit: Payer: Self-pay

## 2021-01-28 DIAGNOSIS — I5022 Chronic systolic (congestive) heart failure: Secondary | ICD-10-CM

## 2021-01-28 LAB — GLUCOSE, CAPILLARY
Glucose-Capillary: 140 mg/dL — ABNORMAL HIGH (ref 70–99)
Glucose-Capillary: 127 mg/dL — ABNORMAL HIGH (ref 70–99)

## 2021-01-28 NOTE — Progress Notes (Signed)
Daily Session Note  Patient Details  Name: Nicholas Horton MRN: [id number] Date of Birth: Aug 09, 1975 Referring Provider:   Flowsheet Row Cardiac Rehab from 01/13/2021 in [facility] Cardiac and Pulmonary Rehab  Referring Provider Newman Pies       Encounter Date: 01/28/2021  Check In:  Session Check In - 01/28/21 1107       Check-In   Supervising physician immediately available to respond to emergencies See telemetry face sheet for immediately available ER MD    Location [facility]-Cardiac & Pulmonary Rehab    Staff Present Renita Papa, RN BSN;Joseph [place], RCP,RRT,BSRT;Jessica [place], [place], RCEP, CCRP, CCET    Virtual Visit No    Medication changes reported     No    Fall or balance concerns reported    No    Warm-up and Cool-down Performed on first and last piece of equipment    Resistance Training Performed Yes    VAD Patient? No    PAD/SET Patient? No      Pain Assessment   Currently in Pain? No/denies                Social History   Tobacco Use  Smoking Status Former   Packs/day: 2.00   Years: 20.00   Pack years: 40.00   Types: Cigarettes  Smokeless Tobacco Former  Tobacco Comments   quit 10/04/14    Goals Met:  Independence with exercise equipment Exercise tolerated well No report of concerns or symptoms today Strength training completed today  Goals Unmet:  Not Applicable  Comments: Pt able to follow exercise prescription today without complaint.  Will continue to monitor for progression.    Dr. Emily Filbert is Medical Director for [facility].  Dr. Ottie Glazier is Medical Director for [facility] Pulmonary Rehabilitation.

## 2021-02-02 ENCOUNTER — Encounter: Payer: Medicare HMO | Admitting: *Deleted

## 2021-02-02 ENCOUNTER — Other Ambulatory Visit: Payer: Self-pay

## 2021-02-02 ENCOUNTER — Encounter: Payer: Self-pay | Admitting: *Deleted

## 2021-02-02 DIAGNOSIS — I5022 Chronic systolic (congestive) heart failure: Secondary | ICD-10-CM | POA: Diagnosis not present

## 2021-02-02 NOTE — Progress Notes (Signed)
Daily Session Note  Patient Details  Name: Nicholas Horton MRN: [id number] Date of Birth: 08-03-1975 Referring Provider:   Flowsheet Row Cardiac Rehab from 01/13/2021 in [facility] Cardiac and Pulmonary Rehab  Referring Provider Newman Pies       Encounter Date: 02/02/2021  Check In:  Session Check In - 02/02/21 1113       Check-In   Supervising physician immediately available to respond to emergencies See telemetry face sheet for immediately available ER MD    Location [facility]-Cardiac & Pulmonary Rehab    Staff Present Renita Papa, RN BSN;Joseph [place], RCP,RRT,BSRT;Jessica [place], [place], RCEP, CCRP, CCET    Virtual Visit No    Medication changes reported     No    Fall or balance concerns reported    No    Warm-up and Cool-down Performed on first and last piece of equipment    Resistance Training Performed Yes    VAD Patient? No    PAD/SET Patient? No      Pain Assessment   Currently in Pain? No/denies                Social History   Tobacco Use  Smoking Status Former   Packs/day: 2.00   Years: 20.00   Pack years: 40.00   Types: Cigarettes  Smokeless Tobacco Former  Tobacco Comments   quit 10/04/14    Goals Met:  Independence with exercise equipment Exercise tolerated well No report of concerns or symptoms today Strength training completed today  Goals Unmet:  Not Applicable  Comments: Pt able to follow exercise prescription today without complaint.  Will continue to monitor for progression.    Dr. Emily Filbert is Medical Director for [facility].  Dr. Ottie Glazier is Medical Director for [facility] Pulmonary Rehabilitation.

## 2021-02-02 NOTE — Progress Notes (Signed)
Cardiac Individual Treatment Plan  Patient Details  Name: Nicholas Horton MRN: [id number] Date of Birth: 10-Jul-1975 Referring Provider:   Flowsheet Row Cardiac Rehab from 01/13/2021 in [facility] Cardiac and Pulmonary Rehab  Referring Provider Newman Pies       Initial Encounter Date:  Flowsheet Row Cardiac Rehab from 01/13/2021 in [facility] Cardiac and Pulmonary Rehab  Date 01/13/21       Visit Diagnosis: Heart failure, chronic systolic ([place])  Patient's Home Medications on Admission:  Current Outpatient Medications:    albuterol (VENTOLIN HFA) 108 (90 Base) MCG/ACT inhaler, Inhale into the lungs., Disp: , Rfl:    ARIPiprazole (ABILIFY) 10 MG tablet, Take 1 tablet (10 mg total) by mouth daily., Disp: 30 tablet, Rfl: 1   artificial tears (LACRILUBE) OINT ophthalmic ointment, Place 1 application into both eyes every 8 (eight) hours. (Patient not taking: Reported on 12/08/2020), Disp: 1 Tube, Rfl: 0   aspirin 81 MG chewable tablet, Place 1 tablet (81 mg total) into feeding tube daily. (Patient not taking: Reported on 12/08/2020), Disp: 1 tablet, Rfl: 0   buPROPion (WELLBUTRIN SR) 150 MG 12 hr tablet, Take 3 tablets (450 mg total) by mouth daily., Disp: 90 tablet, Rfl: 1   dapagliflozin propanediol (FARXIGA) 10 MG TABS tablet, Take 1 tablet by mouth daily., Disp: , Rfl:    diltiazem (CARDIZEM CD) 120 MG 24 hr capsule, Take 1 capsule (120 mg total) by mouth daily. OFFICE VISIT NEEDED, Disp: 30 capsule, Rfl: 1   furosemide (LASIX) 20 MG tablet, Take 1 tablet (20 mg total) by mouth daily., Disp: 30 tablet, Rfl: 1   Glycopyrrolate-Formoterol (BEVESPI AEROSPHERE) 9-4.8 MCG/ACT AERO, INHALE 2 PUFFS INTO THE LUNGS TWICE A DAY, Disp: , Rfl:    pantoprazole sodium (PROTONIX) 40 mg/20 mL PACK, Place 20 mLs (40 mg total) into feeding tube daily. (Patient not taking: Reported on 12/08/2020), Disp: 30 each, Rfl: 0   polyethylene glycol (MIRALAX / GLYCOLAX) packet, Place 17 g into feeding tube daily. (Patient not taking:  Reported on 12/08/2020), Disp: 14 each, Rfl: 0   senna-docusate (SENOKOT-S) 8.6-50 MG tablet, Place 2 tablets into feeding tube 2 (two) times daily. (Patient not taking: Reported on 12/08/2020), Disp: 2 tablet, Rfl: 0   spironolactone (ALDACTONE) 25 MG tablet, Take 1 tablet (25 mg total) by mouth daily., Disp: 30 tablet, Rfl: 1   tadalafil (CIALIS) 20 MG tablet, Take by mouth., Disp: , Rfl:   Past Medical History: Past Medical History:  Diagnosis Date   CHF (congestive heart failure) ([place])    Congenital bicuspid aortic valve 09/2014   - s/p AVR   Nonischemic cardiomyopathy ([facility]) -- Resolved[I42.9] 09/2014   EF by Echo 20-25% (pre-op AVR) --> Echo 10/2014 & 03/2015: EF 50-55% - also Recent Normal Diastolic parameters    S/P AVR (aortic valve replacement) 10/2014   21 mm Carbometrics Mechanical Valve -- Epicardial PPM Leads   Severe aortic stenosis 10/04/2014   Presented with Syncope & CHF    Tobacco Use: Social History   Tobacco Use  Smoking Status Former   Packs/day: 2.00   Years: 20.00   Pack years: 40.00   Types: Cigarettes  Smokeless Tobacco Former  Tobacco Comments   quit 10/04/14    Labs: Recent Review Flowsheet Data     Labs for ITP Cardiac and Pulmonary Rehab Latest Ref Rng & Units 03/24/2018 03/24/2018 03/24/2018 03/24/2018 01/17/2021   Cholestrol 0 - 200 mg/dL - - - - 157   LDLCALC 0 - 99 mg/dL - - - -  60   HDL >40 mg/dL - - - - 42(A)   Trlycerides <150 mg/dL - - - - 768(T)   Hemoglobin A1c <5.7 % of total Hgb - - - - -   PHART 7.350 - 7.450 7.46(H) 7.31(L) 7.33(L) 7.49(H) -   PCO2ART 32.0 - 48.0 mmHg 60(H) 88(HH) 79(HH) 47 -   HCO3 20.0 - 28.0 mmol/L 42.7(H) 44.3(H) 41.7(H) 35.8(H) -   TCO2 0 - 100 mmol/L - - - - -   ACIDBASEDEF 0.0 - 2.0 mmol/L - - - - -   O2SAT % 93.5 85.1 99.2 98.8 -        Exercise Target Goals: Exercise Program Goal: Individual exercise prescription set using results from initial 6 min walk test and THRR while considering  patient's activity  barriers and safety.   Exercise Prescription Goal: Initial exercise prescription builds to 30-45 minutes a day of aerobic activity, 2-3 days per week.  Home exercise guidelines will be given to patient during program as part of exercise prescription that the participant will acknowledge.   Education: Aerobic Exercise: - Group verbal and visual presentation on the components of exercise prescription. Introduces F.I.T.T principle from ACSM for exercise prescriptions.  Reviews F.I.T.T. principles of aerobic exercise including progression. Written material given at graduation.   Education: Resistance Exercise: - Group verbal and visual presentation on the components of exercise prescription. Introduces F.I.T.T principle from ACSM for exercise prescriptions  Reviews F.I.T.T. principles of resistance exercise including progression. Written material given at graduation. Flowsheet Row Cardiac Rehab from 01/26/2021 in [facility] Cardiac and Pulmonary Rehab  Date 01/26/21  Educator [facility]  Instruction Review Code 1- Verbalizes Understanding        Education: Exercise & Equipment Safety: - Individual verbal instruction and demonstration of equipment use and safety with use of the equipment. Flowsheet Row Cardiac Rehab from 01/26/2021 in [facility] Cardiac and Pulmonary Rehab  Date 01/13/21  Educator AS  Instruction Review Code 1- Verbalizes Understanding       Education: Exercise Physiology & General Exercise Guidelines: - Group verbal and written instruction with models to review the exercise physiology of the cardiovascular system and associated critical values. Provides general exercise guidelines with specific guidelines to those with heart or lung disease.    Education: Flexibility, Balance, Mind/Body Relaxation: - Group verbal and visual presentation with interactive activity on the components of exercise prescription. Introduces F.I.T.T principle from ACSM for exercise prescriptions. Reviews F.I.T.T.  principles of flexibility and balance exercise training including progression. Also discusses the mind body connection.  Reviews various relaxation techniques to help reduce and manage stress (i.e. Deep breathing, progressive muscle relaxation, and visualization). Balance handout provided to take home. Written material given at graduation.   Activity Barriers & Risk Stratification:  Activity Barriers & Cardiac Risk Stratification - 12/08/20 1533       Activity Barriers & Cardiac Risk Stratification   Activity Barriers None    Cardiac Risk Stratification High             6 Minute Walk:  6 Minute Walk     Row Name 01/13/21 1544         6 Minute Walk   Phase Initial     Distance 1180 feet     Walk Time 6 minutes     # of Rest Breaks 0     MPH 2.2     METS 3.68     RPE 9     Perceived Dyspnea  1  VO2 Peak 12.9     Symptoms No     Resting HR 77 bpm     Resting BP 102/58     Resting Oxygen Saturation  94 %     Exercise Oxygen Saturation  during 6 min walk 94 %     Max Ex. HR 88 bpm     Max Ex. BP 118/72     2 Minute Post BP 102/66              Oxygen Initial Assessment:   Oxygen Re-Evaluation:   Oxygen Discharge (Final Oxygen Re-Evaluation):   Initial Exercise Prescription:  Initial Exercise Prescription - 01/13/21 1500       Date of Initial Exercise RX and Referring Provider   Date 01/13/21    Referring Provider Margaretha Glassing      Treadmill   MPH 2.2    Grade 1.5    Minutes 15    METs 3.2      REL-XR   Level 3    Speed 50    Minutes 15    METs 3.2      T5 Nustep   Level 2    SPM 80    Minutes 15    METs 3.2      Prescription Details   Frequency (times per week) 3    Duration Progress to 30 minutes of continuous aerobic without signs/symptoms of physical distress      Intensity   THRR 40-80% of Max Heartrate 116-156    Ratings of Perceived Exertion 11-13    Perceived Dyspnea 0-4      Resistance Training   Training Prescription Yes     Weight 3 lb    Reps 10-15             Perform Capillary Blood Glucose checks as needed.  Exercise Prescription Changes:   Exercise Prescription Changes     Row Name 01/13/21 1500             Response to Exercise   Blood Pressure (Admit) 102/58       Blood Pressure (Exercise) 118/72       Blood Pressure (Exit) 102/66       Heart Rate (Admit) 77 bpm       Heart Rate (Exercise) 88 bpm       Heart Rate (Exit) 83 bpm       Oxygen Saturation (Admit) 94 %       Oxygen Saturation (Exercise) 94 %       Rating of Perceived Exertion (Exercise) 9       Perceived Dyspnea (Exercise) 1       Symptoms none                Exercise Comments:   Exercise Comments     Row Name 01/24/21 1617           Exercise Comments First full day of exercise!  Patient was oriented to gym and equipment including functions, settings, policies, and procedures.  Patient's individual exercise prescription and treatment plan were reviewed.  All starting workloads were established based on the results of the 6 minute walk test done at initial orientation visit.  The plan for exercise progression was also introduced and progression will be customized based on patient's performance and goals.                Exercise Goals and Review:   Exercise Goals     Row Name 01/13/21 [phone]  Exercise Goals   Increase Physical Activity Yes       Intervention Provide advice, education, support and counseling about physical activity/exercise needs.;Develop an individualized exercise prescription for aerobic and resistive training based on initial evaluation findings, risk stratification, comorbidities and participant's personal goals.       Expected Outcomes Short Term: Attend rehab on a regular basis to increase amount of physical activity.;Long Term: Add in home exercise to make exercise part of routine and to increase amount of physical activity.;Long Term: Exercising regularly at least 3-5  days a week.       Increase Strength and Stamina Yes       Intervention Provide advice, education, support and counseling about physical activity/exercise needs.;Develop an individualized exercise prescription for aerobic and resistive training based on initial evaluation findings, risk stratification, comorbidities and participant's personal goals.       Expected Outcomes Short Term: Increase workloads from initial exercise prescription for resistance, speed, and METs.;Short Term: Perform resistance training exercises routinely during rehab and add in resistance training at home;Long Term: Improve cardiorespiratory fitness, muscular endurance and strength as measured by increased METs and functional capacity ( )       Able to understand and use rate of perceived exertion (RPE) scale Yes       Intervention Provide education and explanation on how to use RPE scale       Expected Outcomes Short Term: Able to use RPE daily in rehab to express subjective intensity level;Long Term:  Able to use RPE to guide intensity level when exercising independently       Able to understand and use Dyspnea scale Yes       Intervention Provide education and explanation on how to use Dyspnea scale       Expected Outcomes Short Term: Able to use Dyspnea scale daily in rehab to express subjective sense of shortness of breath during exertion;Long Term: Able to use Dyspnea scale to guide intensity level when exercising independently       Knowledge and understanding of Target Heart Rate Range (THRR) Yes       Intervention Provide education and explanation of THRR including how the numbers were predicted and where they are located for reference       Expected Outcomes Short Term: Able to state/look up THRR;Short Term: Able to use daily as guideline for intensity in rehab;Long Term: Able to use THRR to govern intensity when exercising independently       Able to check pulse independently Yes       Intervention Provide  education and demonstration on how to check pulse in carotid and radial arteries.;Review the importance of being able to check your own pulse for safety during independent exercise       Expected Outcomes Short Term: Able to explain why pulse checking is important during independent exercise;Long Term: Able to check pulse independently and accurately       Understanding of Exercise Prescription Yes                Exercise Goals Re-Evaluation :  Exercise Goals Re-Evaluation     Row Name 01/24/21 1617             Exercise Goal Re-Evaluation   Exercise Goals Review Increase Physical Activity;Able to understand and use rate of perceived exertion (RPE) scale;Knowledge and understanding of Target Heart Rate Range (THRR);Understanding of Exercise Prescription;Increase Strength and Stamina;Able to understand and use Dyspnea scale;Able to check pulse independently  Comments Reviewed RPE and dyspnea scales, THR and program prescription with pt today.  Pt voiced understanding and was given a copy of goals to take home.       Expected Outcomes Short: Use RPE daily to regulate intensity. Long: Follow program prescription in THR.                Discharge Exercise Prescription (Final Exercise Prescription Changes):  Exercise Prescription Changes - 01/13/21 1500       Response to Exercise   Blood Pressure (Admit) 102/58    Blood Pressure (Exercise) 118/72    Blood Pressure (Exit) 102/66    Heart Rate (Admit) 77 bpm    Heart Rate (Exercise) 88 bpm    Heart Rate (Exit) 83 bpm    Oxygen Saturation (Admit) 94 %    Oxygen Saturation (Exercise) 94 %    Rating of Perceived Exertion (Exercise) 9    Perceived Dyspnea (Exercise) 1    Symptoms none             Nutrition:  Target Goals: Understanding of nutrition guidelines, daily intake of sodium 1500mg , cholesterol 200mg , calories 30% from fat and 7% or less from saturated fats, daily to have 5 or more servings of fruits and  vegetables.  Education: All About Nutrition: -Group instruction provided by verbal, written material, interactive activities, discussions, models, and posters to present general guidelines for heart healthy nutrition including fat, fiber, MyPlate, the role of sodium in heart healthy nutrition, utilization of the nutrition label, and utilization of this knowledge for meal planning. Follow up email sent as well. Written material given at graduation.   Biometrics:  Pre Biometrics - 01/13/21 1551       Pre Biometrics   Height 6' 0.5" (1.842 m)    Weight 239 lb 3.2 oz (108.5 kg)    BMI (Calculated) 31.98    Single Leg Stand 30 seconds              Nutrition Therapy Plan and Nutrition Goals:   Nutrition Assessments:  MEDIFICTS Score Key: ?70 Need to make dietary changes  40-70 Heart Healthy Diet ? 40 Therapeutic Level Cholesterol Diet  Flowsheet Row Cardiac Rehab from 01/13/2021 in [facility] Cardiac and Pulmonary Rehab  Picture Your Plate Total Score on Admission 62      Picture Your Plate Scores: <21 Unhealthy dietary pattern with much room for improvement. 41-50 Dietary pattern unlikely to meet recommendations for good health and room for improvement. 51-60 More healthful dietary pattern, with some room for improvement.  >60 Healthy dietary pattern, although there may be some specific behaviors that could be improved.    Nutrition Goals Re-Evaluation:   Nutrition Goals Discharge (Final Nutrition Goals Re-Evaluation):   Psychosocial: Target Goals: Acknowledge presence or absence of significant depression and/or stress, maximize coping skills, provide positive support system. Participant is able to verbalize types and ability to use techniques and skills needed for reducing stress and depression.   Education: Stress, Anxiety, and Depression - Group verbal and visual presentation to define topics covered.  Reviews how body is impacted by stress, anxiety, and depression.   Also discusses healthy ways to reduce stress and to treat/manage anxiety and depression.  Written material given at graduation.   Education: Sleep Hygiene -Provides group verbal and written instruction about how sleep can affect your health.  Define sleep hygiene, discuss sleep cycles and impact of sleep habits. Review good sleep hygiene tips.    Initial Review & Psychosocial Screening:  Initial  Psych Review & Screening - 12/08/20 1534       Initial Review   Current issues with Current Sleep Concerns;History of Depression;Current Depression;Current Stress Concerns;Current Psychotropic Meds    Source of Stress Concerns Unable to participate in former interests or hobbies;Unable to perform yard/household activities      Family Dynamics   Good Support System? Yes   fiance     Barriers   Psychosocial barriers to participate in program There are no identifiable barriers or psychosocial needs.      Screening Interventions   Interventions Encouraged to exercise;Provide feedback about the scores to participant;To provide support and resources with identified psychosocial needs    Expected Outcomes Short Term goal: Utilizing psychosocial counselor, staff and physician to assist with identification of specific Stressors or current issues interfering with healing process. Setting desired goal for each stressor or current issue identified.;Long Term Goal: Stressors or current issues are controlled or eliminated.;Short Term goal: Identification and review with participant of any Quality of Life or Depression concerns found by scoring the questionnaire.;Long Term goal: The participant improves quality of Life and PHQ9 Scores as seen by post scores and/or verbalization of changes             Quality of Life Scores:   Quality of Life - 01/13/21 1554       Quality of Life   Select Quality of Life      Quality of Life Scores   Health/Function Pre 9.63 %    Socioeconomic Pre 17.43 %     Psych/Spiritual Pre 9.57 %    Family Pre 15.6 %    GLOBAL Pre 12.1 %            Scores of 19 and below usually indicate a poorer quality of life in these areas.  A difference of  2-3 points is a clinically meaningful difference.  A difference of 2-3 points in the total score of the Quality of Life Index has been associated with significant improvement in overall quality of life, self-image, physical symptoms, and general health in studies assessing change in quality of life.  PHQ-9: Recent Review Flowsheet Data     Depression screen [facility] 2/9 01/13/2021 02/18/2018 12/19/2017   Decreased Interest 2 2 2    Down, Depressed, Hopeless 3  2 2    PHQ - 2 Score 5 4 4    Altered sleeping 2 2 3    Tired, decreased energy 2 2 3    Change in appetite 1 3 2    Feeling bad or failure about yourself  3 2 2    Trouble concentrating 0 2 3   Moving slowly or fidgety/restless 1 2 2    Suicidal thoughts 3  0 1   PHQ-9 Score 17 17 20    Difficult doing work/chores Somewhat difficult Extremely dIfficult Somewhat difficult      Interpretation of Total Score  Total Score Depression Severity:  1-4 = Minimal depression, 5-9 = Mild depression, 10-14 = Moderate depression, 15-19 = Moderately severe depression, 20-27 = Severe depression   Psychosocial Evaluation and Intervention:  Psychosocial Evaluation - 12/08/20 1549       Psychosocial Evaluation & Interventions   Interventions Encouraged to exercise with the program and follow exercise prescription;Stress management education;Relaxation education    Comments Okechukwu is coming to cardiac rehab for Systolic HF. He has had issues with his heart since 2016 when he got an AVR. Since then, he has gotten a pacemaker, developed COPD, is being worked up for a possible ICD,  and is also a diabetic. He is on treatment for his depression and reports it works "most of the time" and his fiance helps keep his mood more level by being attentive to his mood and talking things  through. His biggest goal during this program is to lose weight as he feels this will really help his heart. His main motivation for maintaining a heart healthy lifestyle is his 44 year old daughter. He states his sleep is very irregular, most nights going to bed very late and sleeping late. He tries to do things around the house even though he doesn't want to and he notices his shortness of breath which leads to chest discomfort happens during a lot of physical activity. He has been trying to walk more but the summer heat has made that difficult.    Expected Outcomes Short: attend cardiac rehab for education and exercise. Long: develop and maintain positive self care habits.    Continue Psychosocial Services  Follow up required by staff             Psychosocial Re-Evaluation:   Psychosocial Discharge (Final Psychosocial Re-Evaluation):   Vocational Rehabilitation: Provide vocational rehab assistance to qualifying candidates.   Vocational Rehab Evaluation & Intervention:  Vocational Rehab - 12/08/20 1543       Initial Vocational Rehab Evaluation & Intervention   Assessment shows need for Vocational Rehabilitation No             Education: Education Goals: Education classes will be provided on a variety of topics geared toward better understanding of heart health and risk factor modification. Participant will state understanding/return demonstration of topics presented as noted by education test scores.  Learning Barriers/Preferences:  Learning Barriers/Preferences - 12/08/20 1542       Learning Barriers/Preferences   Learning Barriers None    Learning Preferences None             General Cardiac Education Topics:  AED/CPR: - Group verbal and written instruction with the use of models to demonstrate the basic use of the AED with the basic ABC's of resuscitation.   Anatomy and Cardiac Procedures: - Group verbal and visual presentation and models provide  information about basic cardiac anatomy and function. Reviews the testing methods done to diagnose heart disease and the outcomes of the test results. Describes the treatment choices: Medical Management, Angioplasty, or Coronary Bypass Surgery for treating various heart conditions including Myocardial Infarction, Angina, Valve Disease, and Cardiac Arrhythmias.  Written material given at graduation. Flowsheet Row Cardiac Rehab [facility]  Date 01/26/21  Educator [facility]  Instruction Review Code 1- Verbalizes Understanding       Medication Safety: - Group verbal and visual instruction to review commonly prescribed medications for heart and lung disease. Reviews the medication, class of the drug, and side effects. Includes the steps to properly store meds and maintain the prescription regimen.  Written material given at graduation.   Intimacy: - Group verbal instruction through game format to discuss how heart and lung disease can affect sexual intimacy. Written material given at graduation..   Know Your Numbers and Heart Failure: - Group verbal and visual instruction to discuss disease risk factors for cardiac and pulmonary disease and treatment options.  Reviews associated critical values for Overweight/Obesity, Hypertension, Cholesterol, and Diabetes.  Discusses basics of heart failure: signs/symptoms and treatments.  Introduces Heart Failure Zone chart for action plan for heart failure.  Written material given at graduation.   Infection  Prevention: - Provides verbal and written material to individual with discussion of infection control including proper hand washing and proper equipment cleaning during exercise session. Flowsheet Row Cardiac Rehab from 01/26/2021 in [facility] Cardiac and Pulmonary Rehab  Date 01/13/21  Educator AS  Instruction Review Code 1- Verbalizes Understanding       Falls Prevention: - Provides verbal and written material to  individual with discussion of falls prevention and safety. Flowsheet Row Cardiac Rehab from 01/26/2021 in [facility] Cardiac and Pulmonary Rehab  Date 01/13/21  Educator AS  Instruction Review Code 1- Verbalizes Understanding       Other: -Provides group and verbal instruction on various topics (see comments)   Knowledge Questionnaire Score:   Core Components/Risk Factors/Patient Goals at Admission:  Personal Goals and Risk Factors at Admission - 01/13/21 1559       Core Components/Risk Factors/Patient Goals on Admission    Weight Management Yes;Weight Loss    Intervention Weight Management: Develop a combined nutrition and exercise program designed to reach desired caloric intake, while maintaining appropriate intake of nutrient and fiber, sodium and fats, and appropriate energy expenditure required for the weight goal.;Weight Management: Provide education and appropriate resources to help participant work on and attain dietary goals.;Weight Management/Obesity: Establish reasonable short term and long term weight goals.    Admit Weight 239 lb 3.2 oz (108.5 kg)    Goal Weight: Short Term 225 lb (102.1 kg)    Goal Weight: Long Term 200 lb (90.7 kg)    Expected Outcomes Short Term: Continue to assess and modify interventions until short term weight is achieved;Long Term: Adherence to nutrition and physical activity/exercise program aimed toward attainment of established weight goal;Weight Loss: Understanding of general recommendations for a balanced deficit meal plan, which promotes 1-2 lb weight loss per week and includes a negative energy balance of [phone] kcal/d;Understanding of distribution of calorie intake throughout the day with the consumption of 4-5 meals/snacks;Understanding recommendations for meals to include 15-35% energy as protein, 25-35% energy from fat, 35-60% energy from carbohydrates, less than 200mg  of dietary cholesterol, 20-35 gm of total fiber daily    Improve shortness of  breath with ADL's Yes    Intervention Provide education, individualized exercise plan and daily activity instruction to help decrease symptoms of SOB with activities of daily living.    Expected Outcomes Short Term: Improve cardiorespiratory fitness to achieve a reduction of symptoms when performing ADLs;Long Term: Be able to perform more ADLs without symptoms or delay the onset of symptoms    Diabetes Yes    Intervention Provide education about signs/symptoms and action to take for hypo/hyperglycemia.;Provide education about proper nutrition, including hydration, and aerobic/resistive exercise prescription along with prescribed medications to achieve blood glucose in normal ranges: Fasting glucose 65-99 mg/dL    Expected Outcomes Short Term: Participant verbalizes understanding of the signs/symptoms and immediate care of hyper/hypoglycemia, proper foot care and importance of medication, aerobic/resistive exercise and nutrition plan for blood glucose control.;Long Term: Attainment of HbA1C < 7%.    Heart Failure Yes    Intervention Provide a combined exercise and nutrition program that is supplemented with education, support and counseling about heart failure. Directed toward relieving symptoms such as shortness of breath, decreased exercise tolerance, and extremity edema.    Expected Outcomes Improve functional capacity of life;Short term: Attendance in program 2-3 days a week with increased exercise capacity. Reported lower sodium intake. Reported increased fruit and vegetable intake. Reports medication compliance.;Short term: Daily weights obtained and reported for increase.  Utilizing diuretic protocols set by physician.;Long term: Adoption of self-care skills and reduction of barriers for early signs and symptoms recognition and intervention leading to self-care maintenance.             Education:Diabetes - Individual verbal and written instruction to review signs/symptoms of diabetes, desired  ranges of glucose level fasting, after meals and with exercise. Acknowledge that pre and post exercise glucose checks will be done for 3 sessions at entry of program. Flowsheet Row Cardiac Rehab from 01/26/2021 in [facility] Cardiac [facility]hab  Date 01/13/21  Educator AS  Instruction Review Code 1- Verbalizes Understanding       Core Components/Risk Factors/Patient Goals Review:    Core Components/Risk Factors/Patient Goals at Discharge (Final Review):    ITP Comments:  ITP Comments     Row Name 12/08/20 1555 01/13/21 1605 01/24/21 1617 02/02/21 0616     ITP Comments Initial telephone orientation completed. Diagnosis can be found in [facility] 6/21. EP orientation scheduled for Thursday 7/21 at 2pm. Completed and gym orientation. Initial ITP created and sent for review to CR/PR Medical Director: First full day of exercise!  Patient was oriented to gym and equipment including functions, settings, policies, and procedures.  Patient's individual exercise prescription and treatment plan were reviewed.  All starting workloads were established based on the results of the 6 minute walk test done at initial orientation visit.  The plan for exercise progression was also introduced and progression will be customized based on patient's performance and goals. 30 Day review completed. Medical Director ITP review done, changes made as directed, and signed approval by Medical Director.             Comments:

## 2021-02-04 ENCOUNTER — Encounter: Payer: Medicare HMO | Admitting: *Deleted

## 2021-02-04 ENCOUNTER — Other Ambulatory Visit: Payer: Self-pay

## 2021-02-04 DIAGNOSIS — I5022 Chronic systolic (congestive) heart failure: Secondary | ICD-10-CM

## 2021-02-04 NOTE — Progress Notes (Signed)
Daily Session Note  Patient Details  Name: Nicholas Horton MRN: [id number] Date of Birth: 1975/08/22 Referring Provider:   Flowsheet Row Cardiac Rehab from 01/13/2021 in [facility] Cardiac and Pulmonary Rehab  Referring Provider Newman Pies       Encounter Date: 02/04/2021  Check In:  Session Check In - 02/04/21 1117       Check-In   Location [facility]-Cardiac & Pulmonary Rehab    Staff Present Renita Papa, RN BSN;Joseph Emmett, RCP,RRT,BSRT;Jessica Lindstrom, [place], RCEP, CCRP, CCET    Virtual Visit No    Medication changes reported     No    Fall or balance concerns reported    No    Warm-up and Cool-down Performed on first and last piece of equipment    Resistance Training Performed Yes    VAD Patient? No    PAD/SET Patient? No      Pain Assessment   Currently in Pain? No/denies                Social History   Tobacco Use  Smoking Status Former   Packs/day: 2.00   Years: 20.00   Pack years: 40.00   Types: Cigarettes  Smokeless Tobacco Former  Tobacco Comments   quit 10/04/14    Goals Met:  Independence with exercise equipment Exercise tolerated well No report of concerns or symptoms today Strength training completed today  Goals Unmet:  Not Applicable  Comments: Pt able to follow exercise prescription today without complaint.  Will continue to monitor for progression.    Dr. Emily Filbert is Medical Director for [place].  Dr. Ottie Glazier is Medical Director for [place].

## 2021-02-07 ENCOUNTER — Telehealth: Payer: Self-pay

## 2021-02-07 NOTE — Telephone Encounter (Signed)
[name] called to say Nicholas Horton will miss class today due to cold symptoms.  We recommended he test for Covid and be  before returning.

## 2021-02-14 ENCOUNTER — Other Ambulatory Visit: Payer: Self-pay

## 2021-02-14 ENCOUNTER — Encounter: Payer: Medicare HMO | Admitting: *Deleted

## 2021-02-14 DIAGNOSIS — I5022 Chronic systolic (congestive) heart failure: Secondary | ICD-10-CM

## 2021-02-14 NOTE — Progress Notes (Signed)
Daily Session Note  Patient Details  Name: Nicholas Horton MRN: [id number] Date of Birth: February 23, 1976 Referring Provider:   Flowsheet Row Cardiac Rehab from 01/13/2021 in [facility] Cardiac and Pulmonary Rehab  Referring Provider Newman Pies       Encounter Date: 02/14/2021  Check In:  Session Check In - 02/14/21 1109       Check-In   Supervising physician immediately available to respond to emergencies See telemetry face sheet for immediately available ER MD    Location [facility]-Cardiac & Pulmonary Rehab    Staff Present Renita Papa, RN Moises Blood, BS, ACSM CEP, Exercise Physiologist;Jessica [place], MA, RCEP, CCRP, CCET;Amanda Sommer, [place], ACSM CEP, Exercise Physiologist    Virtual Visit No    Medication changes reported     No    Fall or balance concerns reported    No    Warm-up and Cool-down Performed on first and last piece of equipment    Resistance Training Performed Yes    VAD Patient? No    PAD/SET Patient? No      Pain Assessment   Currently in Pain? No/denies                Social History   Tobacco Use  Smoking Status Former   Packs/day: 2.00   Years: 20.00   Pack years: 40.00   Types: Cigarettes  Smokeless Tobacco Former  Tobacco Comments   quit 10/04/14    Goals Met:  Independence with exercise equipment Exercise tolerated well No report of concerns or symptoms today Strength training completed today  Goals Unmet:  Not Applicable  Comments: Pt able to follow exercise prescription today without complaint.  Will continue to monitor for progression.    Dr. Emily Filbert is Medical Director for [place].  Dr. Ottie Glazier is Medical Director for [facility].

## 2021-02-16 ENCOUNTER — Other Ambulatory Visit: Payer: Self-pay

## 2021-02-16 DIAGNOSIS — I5022 Chronic systolic (congestive) heart failure: Secondary | ICD-10-CM | POA: Diagnosis not present

## 2021-02-16 NOTE — Progress Notes (Signed)
Daily Session Note  Patient Details  Name: Nicholas Horton MRN: [id number] Date of Birth: 1975-08-19 Referring Provider:   Flowsheet Row Cardiac Rehab from 01/13/2021 in [facility] Cardiac and Pulmonary Rehab  Referring Provider Newman Pies       Encounter Date: 02/16/2021  Check In:  Session Check In - 02/16/21 1112       Check-In   Supervising physician immediately available to respond to emergencies See telemetry face sheet for immediately available ER MD    Location [facility]-Cardiac & Pulmonary Rehab    Staff Present Birdie Sons, MPA, Elveria Rising, BA, ACSM CEP, Exercise Physiologist;Joseph Tessie Fass, [place]    Virtual Visit No    Medication changes reported     No    Fall or balance concerns reported    No    Warm-up and Cool-down Performed on first and last piece of equipment    Resistance Training Performed Yes    VAD Patient? No    PAD/SET Patient? No      Pain Assessment   Currently in Pain? No/denies                Social History   Tobacco Use  Smoking Status Former   Packs/day: 2.00   Years: 20.00   Pack years: 40.00   Types: Cigarettes  Smokeless Tobacco Former  Tobacco Comments   quit 10/04/14    Goals Met:  Independence with exercise equipment Exercise tolerated well No report of concerns or symptoms today Strength training completed today  Goals Unmet:  Not Applicable  Comments: Pt able to follow exercise prescription today without complaint.  Will continue to monitor for progression.    Dr. Emily Filbert is Medical Director for [place].  Dr. Ottie Glazier is Medical Director for [facility].

## 2021-02-18 ENCOUNTER — Other Ambulatory Visit: Payer: Self-pay

## 2021-02-18 DIAGNOSIS — I5022 Chronic systolic (congestive) heart failure: Secondary | ICD-10-CM

## 2021-02-18 NOTE — Progress Notes (Signed)
Daily Session Note  Patient Details  Name: Nicholas Horton MRN: [id number] Date of Birth: 07/25/1975 Referring Provider:   Flowsheet Row Cardiac Rehab from 01/13/2021 in [facility] Cardiac and Pulmonary Rehab  Referring Provider Newman Pies       Encounter Date: 02/18/2021  Check In:  Session Check In - 02/18/21 1119       Check-In   Supervising physician immediately available to respond to emergencies See telemetry face sheet for immediately available ER MD    Location [facility]-Cardiac & Pulmonary Rehab    Staff Present Hope Budds, RDN, LDN;Jessica Luan Pulling, MA, RCEP, CCRP, CCET;Rubye Strohmeyer, RN, BSN    Virtual Visit No    Medication changes reported     No    Fall or balance concerns reported    No    Warm-up and Cool-down Performed on first and last piece of equipment    Resistance Training Performed Yes    VAD Patient? No    PAD/SET Patient? No      Pain Assessment   Currently in Pain? No/denies                Social History   Tobacco Use  Smoking Status Former   Packs/day: 2.00   Years: 20.00   Pack years: 40.00   Types: Cigarettes  Smokeless Tobacco Former  Tobacco Comments   quit 10/04/14    Goals Met:  Independence with exercise equipment Exercise tolerated well No report of concerns or symptoms today Strength training completed today  Goals Unmet:  Not Applicable  Comments: Pt able to follow exercise prescription today without complaint.  Will continue to monitor for progression.   Dr. Emily Filbert is Medical Director for [place].  Dr. Ottie Glazier is Medical Director for [facility].

## 2021-02-21 ENCOUNTER — Other Ambulatory Visit: Payer: Self-pay

## 2021-02-21 ENCOUNTER — Encounter: Payer: Medicare HMO | Admitting: *Deleted

## 2021-02-21 DIAGNOSIS — I5022 Chronic systolic (congestive) heart failure: Secondary | ICD-10-CM

## 2021-02-21 NOTE — Progress Notes (Signed)
Daily Session Note  Patient Details  Name: Nicholas Horton MRN: [id number] Date of Birth: 02/17/1976 Referring Provider:   Flowsheet Row Cardiac Rehab from 01/13/2021 in [facility] Cardiac and Pulmonary Rehab  Referring Provider [place]       Encounter Date: 02/21/2021  Check In:  Session Check In - 02/21/21 1129       Check-In   Supervising physician immediately available to respond to emergencies See telemetry face sheet for immediately available ER MD    Staff Present  Jo , RN, BSN, MA;Kelly Bollinger, MPA, RN;Kelly Hayes, BS, ACSM CEP, Exercise Physiologist    Virtual Visit No    Medication changes reported     No    Tobacco Cessation No Change    Warm-up and Cool-down Performed on first and last piece of equipment    Resistance Training Performed Yes    PAD/SET Patient? No                Social History   Tobacco Use  Smoking Status Former   Packs/day: 2.00   Years: 20.00   Pack years: 40.00   Types: Cigarettes  Smokeless Tobacco Former  Tobacco Comments   quit 10/04/14    Goals Met:  Independence with exercise equipment Exercise tolerated well No report of concerns or symptoms today  Goals Unmet:  Not Applicable  Comments Pt able to follow exercise prescription today without complaint.  Will continue to monitor for progression.    Dr. Mark Miller is Medical Director for [facility].  Dr. Fuad Aleskerov is Medical Director for [facility]. 

## 2021-02-28 ENCOUNTER — Encounter: Payer: Medicare HMO | Attending: Cardiology

## 2021-02-28 ENCOUNTER — Other Ambulatory Visit: Payer: Self-pay

## 2021-02-28 DIAGNOSIS — I5022 Chronic systolic (congestive) heart failure: Secondary | ICD-10-CM | POA: Insufficient documentation

## 2021-02-28 NOTE — Progress Notes (Signed)
Daily Session Note  Patient Details  Name: Uno Esau MRN: [id number] Date of Birth: September 23, 1975 Referring Provider:   Flowsheet Row Cardiac Rehab from 01/13/2021 in [facility] Cardiac and Pulmonary Rehab  Referring Provider Newman Pies       Encounter Date: 02/28/2021  Check In:  Session Check In - 02/28/21 1106       Check-In   Supervising physician immediately available to respond to emergencies See telemetry face sheet for immediately available ER MD    Location [facility]-Cardiac & Pulmonary Rehab    Staff Present Birdie Sons, MPA, RN;Jessica [place], MA, RCEP, CCRP, CCET;Amanda Sommer, BA, ACSM CEP, Exercise Physiologist;Wade Sigala Condon, BS, ACSM CEP, Exercise Physiologist    Virtual Visit No    Medication changes reported     No    Fall or balance concerns reported    No    Tobacco Cessation No Change    Warm-up and Cool-down Performed on first and last piece of equipment    Resistance Training Performed Yes    VAD Patient? No    PAD/SET Patient? No      Pain Assessment   Currently in Pain? No/denies                Social History   Tobacco Use  Smoking Status Former   Packs/day: 2.00   Years: 20.00   Pack years: 40.00   Types: Cigarettes  Smokeless Tobacco Former  Tobacco Comments   quit 10/04/14    Goals Met:  Independence with exercise equipment Exercise tolerated well No report of concerns or symptoms today Strength training completed today  Goals Unmet:  Not Applicable  Comments: Pt able to follow exercise prescription today without complaint.  Will continue to monitor for progression.    Dr. Emily Filbert is Medical Director for [place].  Dr. Ottie Glazier is Medical Director for [facility].

## 2021-03-02 ENCOUNTER — Encounter: Payer: Self-pay | Admitting: *Deleted

## 2021-03-02 DIAGNOSIS — I5022 Chronic systolic (congestive) heart failure: Secondary | ICD-10-CM

## 2021-03-02 NOTE — Progress Notes (Signed)
Cardiac Individual Treatment Plan  Patient Details  Name: Nicholas Horton MRN: [id number] Date of Birth: 10-Jul-1975 Referring Provider:   Flowsheet Row Cardiac Rehab from 01/13/2021 in [facility] Cardiac and Pulmonary Rehab  Referring Provider Newman Pies       Initial Encounter Date:  Flowsheet Row Cardiac Rehab from 01/13/2021 in [facility] Cardiac and Pulmonary Rehab  Date 01/13/21       Visit Diagnosis: Heart failure, chronic systolic ([place])  Patient's Home Medications on Admission:  Current Outpatient Medications:    albuterol (VENTOLIN HFA) 108 (90 Base) MCG/ACT inhaler, Inhale into the lungs., Disp: , Rfl:    ARIPiprazole (ABILIFY) 10 MG tablet, Take 1 tablet (10 mg total) by mouth daily., Disp: 30 tablet, Rfl: 1   artificial tears (LACRILUBE) OINT ophthalmic ointment, Place 1 application into both eyes every 8 (eight) hours. (Patient not taking: Reported on 12/08/2020), Disp: 1 Tube, Rfl: 0   aspirin 81 MG chewable tablet, Place 1 tablet (81 mg total) into feeding tube daily. (Patient not taking: Reported on 12/08/2020), Disp: 1 tablet, Rfl: 0   buPROPion (WELLBUTRIN SR) 150 MG 12 hr tablet, Take 3 tablets (450 mg total) by mouth daily., Disp: 90 tablet, Rfl: 1   dapagliflozin propanediol (FARXIGA) 10 MG TABS tablet, Take 1 tablet by mouth daily., Disp: , Rfl:    diltiazem (CARDIZEM CD) 120 MG 24 hr capsule, Take 1 capsule (120 mg total) by mouth daily. OFFICE VISIT NEEDED, Disp: 30 capsule, Rfl: 1   furosemide (LASIX) 20 MG tablet, Take 1 tablet (20 mg total) by mouth daily., Disp: 30 tablet, Rfl: 1   Glycopyrrolate-Formoterol (BEVESPI AEROSPHERE) 9-4.8 MCG/ACT AERO, INHALE 2 PUFFS INTO THE LUNGS TWICE A DAY, Disp: , Rfl:    pantoprazole sodium (PROTONIX) 40 mg/20 mL PACK, Place 20 mLs (40 mg total) into feeding tube daily. (Patient not taking: Reported on 12/08/2020), Disp: 30 each, Rfl: 0   polyethylene glycol (MIRALAX / GLYCOLAX) packet, Place 17 g into feeding tube daily. (Patient not taking:  Reported on 12/08/2020), Disp: 14 each, Rfl: 0   senna-docusate (SENOKOT-S) 8.6-50 MG tablet, Place 2 tablets into feeding tube 2 (two) times daily. (Patient not taking: Reported on 12/08/2020), Disp: 2 tablet, Rfl: 0   spironolactone (ALDACTONE) 25 MG tablet, Take 1 tablet (25 mg total) by mouth daily., Disp: 30 tablet, Rfl: 1   tadalafil (CIALIS) 20 MG tablet, Take by mouth., Disp: , Rfl:   Past Medical History: Past Medical History:  Diagnosis Date   CHF (congestive heart failure) ([place])    Congenital bicuspid aortic valve 09/2014   - s/p AVR   Nonischemic cardiomyopathy ([facility]) -- Resolved[I42.9] 09/2014   EF by Echo 20-25% (pre-op AVR) --> Echo 10/2014 & 03/2015: EF 50-55% - also Recent Normal Diastolic parameters    S/P AVR (aortic valve replacement) 10/2014   21 mm Carbometrics Mechanical Valve -- Epicardial PPM Leads   Severe aortic stenosis 10/04/2014   Presented with Syncope & CHF    Tobacco Use: Social History   Tobacco Use  Smoking Status Former   Packs/day: 2.00   Years: 20.00   Pack years: 40.00   Types: Cigarettes  Smokeless Tobacco Former  Tobacco Comments   quit 10/04/14    Labs: Recent Review Flowsheet Data     Labs for ITP Cardiac and Pulmonary Rehab Latest Ref Rng & Units 03/24/2018 03/24/2018 03/24/2018 03/24/2018 01/17/2021   Cholestrol 0 - 200 mg/dL - - - - 157   LDLCALC 0 - 99 mg/dL - - - -  60   HDL >40 mg/dL - - - - 35(L)   Trlycerides <150 mg/dL - - - - 312(H)   Hemoglobin A1c <5.7 % of total Hgb - - - - -   PHART 7.350 - 7.450 7.46(H) 7.31(L) 7.33(L) 7.49(H) -   PCO2ART 32.0 - 48.0 mmHg 60(H) 88(HH) 79(HH) 47 -   HCO3 20.0 - 28.0 mmol/L 42.7(H) 44.3(H) 41.7(H) 35.8(H) -   TCO2 0 - 100 mmol/L - - - - -   ACIDBASEDEF 0.0 - 2.0 mmol/L - - - - -   O2SAT % 93.5 85.1 99.2 98.8 -        Exercise Target Goals: Exercise Program Goal: Individual exercise prescription set using results from initial 6 min walk test and THRR while considering  patient's activity  barriers and safety.   Exercise Prescription Goal: Initial exercise prescription builds to 30-45 minutes a day of aerobic activity, 2-3 days per week.  Home exercise guidelines will be given to patient during program as part of exercise prescription that the participant will acknowledge.   Education: Aerobic Exercise: - Group verbal and visual presentation on the components of exercise prescription. Introduces F.I.T.T principle from ACSM for exercise prescriptions.  Reviews F.I.T.T. principles of aerobic exercise including progression. Written material given at graduation.   Education: Resistance Exercise: - Group verbal and visual presentation on the components of exercise prescription. Introduces F.I.T.T principle from ACSM for exercise prescriptions  Reviews F.I.T.T. principles of resistance exercise including progression. Written material given at graduation. Flowsheet Row Cardiac Rehab from 02/16/2021 in [facility] Cardiac and Pulmonary Rehab  Date 01/26/21  Educator [facility]  Instruction Review Code 1- Verbalizes Understanding        Education: Exercise & Equipment Safety: - Individual verbal instruction and demonstration of equipment use and safety with use of the equipment. Flowsheet Row Cardiac Rehab from 02/16/2021 in [facility] Cardiac and Pulmonary Rehab  Date 01/13/21  Educator [facility]  Instruction Review Code 1- Verbalizes Understanding       Education: Exercise Physiology & General Exercise Guidelines: - Group verbal and written instruction with models to review the exercise physiology of the cardiovascular system and associated critical values. Provides general exercise guidelines with specific guidelines to those with heart or lung disease.    Education: Flexibility, Balance, Mind/Body Relaxation: - Group verbal and visual presentation with interactive activity on the components of exercise prescription. Introduces F.I.T.T principle from ACSM for exercise prescriptions. Reviews F.I.T.T.  principles of flexibility and balance exercise training including progression. Also discusses the mind body connection.  Reviews various relaxation techniques to help reduce and manage stress (i.e. Deep breathing, progressive muscle relaxation, and visualization). Balance handout provided to take home. Written material given at graduation.   Activity Barriers & Risk Stratification:  Activity Barriers & Cardiac Risk Stratification - 12/08/20 1533       Activity Barriers & Cardiac Risk Stratification   Activity Barriers None    Cardiac Risk Stratification High             6 Minute Walk:  6 Minute Walk     Row Name 01/13/21 1544         6 Minute Walk   Phase Initial     Distance 1180 feet     Walk Time 6 minutes     # of Rest Breaks 0     MPH 2.2     METS 3.68     RPE 9     Perceived Dyspnea  1  VO2 Peak 12.9     Symptoms No     Resting HR 77 bpm     Resting BP 102/58     Resting Oxygen Saturation  94 %     Exercise Oxygen Saturation  during 6 min walk 94 %     Max Ex. HR 88 bpm     Max Ex. BP 118/72     2 Minute Post BP 102/66              Oxygen Initial Assessment:   Oxygen Re-Evaluation:   Oxygen Discharge (Final Oxygen Re-Evaluation):   Initial Exercise Prescription:  Initial Exercise Prescription - 01/13/21 1500       Date of Initial Exercise RX and Referring Provider   Date 01/13/21    Referring Provider Newman Pies      Treadmill   MPH 2.2    Grade 1.5    Minutes 15    METs 3.2      REL-XR   Level 3    Speed 50    Minutes 15    METs 3.2      T5 Nustep   Level 2    SPM 80    Minutes 15    METs 3.2      Prescription Details   Frequency (times per week) 3    Duration Progress to 30 minutes of continuous aerobic without signs/symptoms of physical distress      Intensity   THRR 40-80% of Max Heartrate 116-156    Ratings of Perceived Exertion 11-13    Perceived Dyspnea 0-4      Resistance Training   Training Prescription Yes     Weight 3 lb    Reps 10-15             Perform Capillary Blood Glucose checks as needed.  Exercise Prescription Changes:   Exercise Prescription Changes     Row Name 01/13/21 1500 02/07/21 1500 02/21/21 0800         Response to Exercise   Blood Pressure (Admit) 102/58 110/60 150/72     Blood Pressure (Exercise) 118/72 136/64 --     Blood Pressure (Exit) 102/66 104/60 118/68     Heart Rate (Admit) 77 bpm 58 bpm 66 bpm     Heart Rate (Exercise) 88 bpm 103 bpm 114 bpm     Heart Rate (Exit) 83 bpm 72 bpm 83 bpm     Oxygen Saturation (Admit) 94 % -- --     Oxygen Saturation (Exercise) 94 % -- --     Rating of Perceived Exertion (Exercise) _0 Perceived Dyspnea (Exercise) 1 -- --     Symptoms none none none     Duration -- Progress to 30 minutes of  aerobic without signs/symptoms of physical distress Progress to 30 minutes of  aerobic without signs/symptoms of physical distress     Intensity -- THRR unchanged THRR unchanged           Progression   Progression -- Continue to progress workloads to maintain intensity without signs/symptoms of physical distress. Continue to progress workloads to maintain intensity without signs/symptoms of physical distress.     Average METs -- 2.88 3.41           Resistance Training   Training Prescription -- Yes Yes     Weight -- 3 lb 3 lb     Reps -- 10-15 10-15  Interval Training   Interval Training -- No No           Treadmill   MPH -- 2.2 3     Grade -- 1.5 2     Minutes -- 15 15     METs -- 3.14 4.12           NuStep   Level -- 4 --     Minutes -- 15 --     METs -- 3.2 --           REL-XR   Level -- 3 3.5     Minutes -- 15 15     METs -- 2.3 2.7              Exercise Comments:   Exercise Comments     Row Name 01/24/21 1617           Exercise Comments First full day of exercise!  Patient was oriented to gym and equipment including functions, settings, policies, and procedures.   Patient's individual exercise prescription and treatment plan were reviewed.  All starting workloads were established based on the results of the 6 minute walk test done at initial orientation visit.  The plan for exercise progression was also introduced and progression will be customized based on patient's performance and goals.                Exercise Goals and Review:   Exercise Goals     Row Name 01/13/21 1551             Exercise Goals   Increase Physical Activity Yes       Intervention Provide advice, education, support and counseling about physical activity/exercise needs.;Develop an individualized exercise prescription for aerobic and resistive training based on initial evaluation findings, risk stratification, comorbidities and participant's personal goals.       Expected Outcomes Short Term: Attend rehab on a regular basis to increase amount of physical activity.;Long Term: Add in home exercise to make exercise part of routine and to increase amount of physical activity.;Long Term: Exercising regularly at least 3-5 days a week.       Increase Strength and Stamina Yes       Intervention Provide advice, education, support and counseling about physical activity/exercise needs.;Develop an individualized exercise prescription for aerobic and resistive training based on initial evaluation findings, risk stratification, comorbidities and participant's personal goals.       Expected Outcomes Short Term: Increase workloads from initial exercise prescription for resistance, speed, and METs.;Short Term: Perform resistance training exercises routinely during rehab and add in resistance training at home;Long Term: Improve cardiorespiratory fitness, muscular endurance and strength as measured by increased METs and functional capacity (6MWT)       Able to understand and use rate of perceived exertion (RPE) scale Yes       Intervention Provide education and explanation on how to use RPE scale        Expected Outcomes Short Term: Able to use RPE daily in rehab to express subjective intensity level;Long Term:  Able to use RPE to guide intensity level when exercising independently       Able to understand and use Dyspnea scale Yes       Intervention Provide education and explanation on how to use Dyspnea scale       Expected Outcomes Short Term: Able to use Dyspnea scale daily in rehab to express subjective sense of shortness of breath during exertion;Long Term: Able to use  Dyspnea scale to guide intensity level when exercising independently       Knowledge and understanding of Target Heart Rate Range (THRR) Yes       Intervention Provide education and explanation of THRR including how the numbers were predicted and where they are located for reference       Expected Outcomes Short Term: Able to state/look up THRR;Short Term: Able to use daily as guideline for intensity in rehab;Long Term: Able to use THRR to govern intensity when exercising independently       Able to check pulse independently Yes       Intervention Provide education and demonstration on how to check pulse in carotid and radial arteries.;Review the importance of being able to check your own pulse for safety during independent exercise       Expected Outcomes Short Term: Able to explain why pulse checking is important during independent exercise;Long Term: Able to check pulse independently and accurately       Understanding of Exercise Prescription Yes                Exercise Goals Re-Evaluation :  Exercise Goals Re-Evaluation     Row Name 01/24/21 1617 02/07/21 1505 02/21/21 0832         Exercise Goal Re-Evaluation   Exercise Goals Review Increase Physical Activity;Able to understand and use rate of perceived exertion (RPE) scale;Knowledge and understanding of Target Heart Rate Range (THRR);Understanding of Exercise Prescription;Increase Strength and Stamina;Able to understand and use Dyspnea scale;Able to check pulse  independently Increase Physical Activity;Increase Strength and Stamina;Understanding of Exercise Prescription Increase Physical Activity;Increase Strength and Stamina     Comments Reviewed RPE and dyspnea scales, THR and program prescription with pt today.  Pt voiced understanding and was given a copy of goals to take home. Nicholas Horton is off to a good start in rehab.  He is now on level 4 on the NuStep.  We will continue to monitor his progress. Nicholas Horton is doing well and has increased speed and grade on TM.  Staff will encourage increasing weights for strength work     Expected Outcomes Short: Use RPE daily to regulate intensity. Long: Follow program prescription in THR. Short: Increase treadmill. Long: Continue to improve stamina Short: increase weights Long:improve average MET level              Discharge Exercise Prescription (Final Exercise Prescription Changes):  Exercise Prescription Changes - 02/21/21 0800       Response to Exercise   Blood Pressure (Admit) 150/72    Blood Pressure (Exit) 118/68    Heart Rate (Admit) 66 bpm    Heart Rate (Exercise) 114 bpm    Heart Rate (Exit) 83 bpm    Rating of Perceived Exertion (Exercise) 13    Symptoms none    Duration Progress to 30 minutes of  aerobic without signs/symptoms of physical distress    Intensity THRR unchanged      Progression   Progression Continue to progress workloads to maintain intensity without signs/symptoms of physical distress.    Average METs 3.41      Resistance Training   Training Prescription Yes    Weight 3 lb    Reps 10-15      Interval Training   Interval Training No      Treadmill   MPH 3    Grade 2    Minutes 15    METs 4.12      REL-XR   Level 3.5  Minutes 15    METs 2.7             Nutrition:  Target Goals: Understanding of nutrition guidelines, daily intake of sodium <1557m, cholesterol <2066m calories 30% from fat and 7% or less from saturated fats, daily to have 5 or more  servings of fruits and vegetables.  Education: All About Nutrition: -Group instruction provided by verbal, written material, interactive activities, discussions, models, and posters to present general guidelines for heart healthy nutrition including fat, fiber, MyPlate, the role of sodium in heart healthy nutrition, utilization of the nutrition label, and utilization of this knowledge for meal planning. Follow up email sent as well. Written material given at graduation. Flow[facility]  Date [facility]/16[facility]21  [facility] 1- Verbalizes Understanding       Biometrics:  Pre Biometrics - 01/13/21 1551       Pre Biometrics   Height 6' 0.5" (1.842 m)    Weight 239 lb 3.2 oz (108.5 kg)    BMI (Calculated) 31.98    Single Leg Stand 30 seconds              Nutrition Therapy Plan and Nutrition Goals:   Nutrition Assessments:  MEDIFICTS Score Key: ?70 Need to make dietary changes  40-70 Heart Healthy Diet ? 40 Therapeutic Level Cholesterol Diet  [facility]  Picture Your Plate Total Score on Admission 62      Picture Your Plate Scores: <4<92nhealthy dietary pattern with much room for improvement. 41-50 Dietary pattern unlikely to meet recommendations for good health and room for improvement. 51-60 More healthful dietary pattern, with some room for improvement.  >60 Healthy dietary pattern, although there may be some specific behaviors that could be improved.    Nutrition Goals Re-Evaluation:  Nutrition Goals Re-Evaluation     Row Name 02/16/21 1125             Goals   Comment Nicholas Horton not met with RD yet.                Nutrition Goals Discharge (Final Nutrition Goals Re-Evaluation):  Nutrition Goals Re-Evaluation - 02/16/21 1125       Goals   Comment Nicholas Horton has not met with RD yet.              Psychosocial: Target Goals: Acknowledge presence or absence of significant depression and/or stress, maximize coping skills, provide positive support system. Participant is able to verbalize types and ability to use techniques and skills needed for reducing stress and depression.   Education: Stress, Anxiety, and Depression - Group verbal and visual presentation to define topics covered.  Reviews how body is impacted by stress, anxiety, and depression.  Also discusses healthy ways to reduce stress and to treat/manage anxiety and depression.  Written material given at graduation.   Education: Sleep Hygiene -Provides group verbal and written instruction about how sleep can affect your health.  Define sleep hygiene, discuss sleep cycles and impact of sleep habits. Review good sleep hygiene tips.    Initial Review & Psychosocial Screening:  Initial Psych Review & Screening - 12/08/20 1534       Initial Review   Current issues with Current Sleep Concerns;History of Depression;Current Depression;Current Stress Concerns;Current Psychotropic Meds    Source of Stress Concerns Unable to participate in former interests or hobbies;Unable to perform yard/household activities  Family Dynamics   Good Support System? Yes   fiance     Barriers   Psychosocial barriers to participate in program There are no identifiable barriers or psychosocial needs.      Screening Interventions   Interventions Encouraged to exercise;Provide feedback about the scores to participant;To provide support and resources with identified psychosocial needs    Expected Outcomes Short Term goal: Utilizing psychosocial counselor, staff and physician to assist with identification of specific Stressors or current issues interfering with healing process. Setting desired goal for each stressor or current issue identified.;Long Term Goal: Stressors or current issues are controlled or eliminated.;Short Term goal: Identification  and review with participant of any Quality of Life or Depression concerns found by scoring the questionnaire.;Long Term goal: The participant improves quality of Life and PHQ9 Scores as seen by post scores and/or verbalization of changes             Quality of Life Scores:   Quality of Life - 01/13/21 1554       Quality of Life   Select Quality of Life      Quality of Life Scores   Health/Function Pre 9.63 %    Socioeconomic Pre 17.43 %    Psych/Spiritual Pre 9.57 %    Family Pre 15.6 %    GLOBAL Pre 12.1 %            Scores of 19 and below usually indicate a poorer quality of life in these areas.  A difference of  2-3 points is a clinically meaningful difference.  A difference of 2-3 points in the total score of the Quality of Life Index has been associated with significant improvement in overall quality of life, self-image, physical symptoms, and general health in studies assessing change in quality of life.  PHQ-9: Recent Review Flowsheet Data     Depression screen [facility] 2/9 02/16/2021 01/13/2021 02/18/2018 12/19/2017   Decreased Interest _0 Down, Depressed, Hopeless _1 PHQ - 2 Score _2 Altered sleeping _3 Tired, decreased energy _4 Change in appetite _5 Feeling bad or failure about yourself  _6 Trouble concentrating 1 0 2 3   Moving slowly or fidgety/restless _7 Suicidal thoughts 0 3  0 1   PHQ-9 Score _8 Difficult doing work/chores Somewhat difficult Somewhat difficult Extremely dIfficult Somewhat difficult      Interpretation of Total Score  Total Score Depression Severity:  1-4 = Minimal depression, 5-9 = Mild depression, 10-14 = Moderate depression, 15-19 = Moderately severe depression, 20-27 = Severe depression   Psychosocial Evaluation and Intervention:  Psychosocial Evaluation - 12/08/20 1549       Psychosocial Evaluation & Interventions   Interventions Encouraged to exercise with the  program and follow exercise prescription;Stress management education;Relaxation education    Comments Nicholas Horton is coming to cardiac rehab for Systolic HF. He has had issues with his heart since 2016 when he got an AVR. Since then, he has gotten a pacemaker, developed COPD, is being worked up for a possible ICD, and is also a diabetic. He is on treatment for his depression and reports it works "most of the time" and his fiance helps keep his mood more level by being attentive to his mood and talking things through.  His biggest goal during this program is to lose weight as he feels this will really help his heart. His main motivation for maintaining a heart healthy lifestyle is his 56 year old daughter. He states his sleep is very irregular, most nights going to bed very late and sleeping late. He tries to do things around the house even though he doesn't want to and he notices his shortness of breath which leads to chest discomfort happens during a lot of physical activity. He has been trying to walk more but the summer heat has made that difficult.    Expected Outcomes Short: attend cardiac rehab for education and exercise. Long: develop and maintain positive self care habits.    Continue Psychosocial Services  Follow up required by staff             Psychosocial Re-Evaluation:  Psychosocial Re-Evaluation     Nicholas Horton Name 02/16/21 1119             Psychosocial Re-Evaluation   Current issues with Current Stress Concerns;Current Psychotropic Meds;History of Depression;Current Sleep Concerns       Comments Reviewed PHQ-9 with patient and score has improved.  He states he feels better overall.  He has trouble with sleep.  We reveiwed not having caffeine on the afternoon, as well not not eating too late, not using phone or computer right before bed.  He reports taking meds as directed.       Expected Outcomes Short: work on better sleep habits Long:  be able to sleep better consistently, exercise to help  with symptoms of depression and anxiety                Psychosocial Discharge (Final Psychosocial Re-Evaluation):  Psychosocial Re-Evaluation - 02/16/21 1119       Psychosocial Re-Evaluation   Current issues with Current Stress Concerns;Current Psychotropic Meds;History of Depression;Current Sleep Concerns    Comments Reviewed PHQ-9 with patient and score has improved.  He states he feels better overall.  He has trouble with sleep.  We reveiwed not having caffeine on the afternoon, as well not not eating too late, not using phone or computer right before bed.  He reports taking meds as directed.    Expected Outcomes Short: work on better sleep habits Long:  be able to sleep better consistently, exercise to help with symptoms of depression and anxiety             Vocational Rehabilitation: Provide vocational rehab assistance to qualifying candidates.   Vocational Rehab Evaluation & Intervention:  Vocational Rehab - 12/08/20 1543       Initial Vocational Rehab Evaluation & Intervention   Assessment shows need for Vocational Rehabilitation No             Education: Education Goals: Education classes will be provided on a variety of topics geared toward better understanding of heart health and risk factor modification. Participant will state understanding/return demonstration of topics presented as noted by education test scores.  Learning Barriers/Preferences:  Learning Barriers/Preferences - 12/08/20 1542       Learning Barriers/Preferences   Learning Barriers None    Learning Preferences None             General Cardiac Education Topics:  AED/CPR: - Group verbal and written instruction with the use of models to demonstrate the basic use of the AED with the basic ABC's of resuscitation.   Anatomy and Cardiac Procedures: - Group verbal and visual presentation and models provide information  about basic cardiac anatomy and function. Reviews the testing  methods done to diagnose heart disease and the outcomes of the test results. Describes the treatment choices: Medical Management, Angioplasty, or Coronary Bypass Surgery for treating various heart conditions including Myocardial Infarction, Angina, Valve Disease, and Cardiac Arrhythmias.  Written material given at graduation. Flowsheet Row Cardiac Rehab from 02/16/2021 in [facility] Cardiac and Pulmonary Rehab  Date 01/26/21  Educator [facility]  Instruction Review Code 1- Verbalizes Understanding       Medication Safety: - Group verbal and visual instruction to review commonly prescribed medications for heart and lung disease. Reviews the medication, class of the drug, and side effects. Includes the steps to properly store meds and maintain the prescription regimen.  Written material given at graduation.   Intimacy: - Group verbal instruction through game format to discuss how heart and lung disease can affect sexual intimacy. Written material given at graduation..   Know Your Numbers and Heart Failure: - Group verbal and visual instruction to discuss disease risk factors for cardiac and pulmonary disease and treatment options.  Reviews associated critical values for Overweight/Obesity, Hypertension, Cholesterol, and Diabetes.  Discusses basics of heart failure: signs/symptoms and treatments.  Introduces Heart Failure Zone chart for action plan for heart failure.  Written material given at graduation.   Infection Prevention: - Provides verbal and written material to individual with discussion of infection control including proper hand washing and proper equipment cleaning during exercise session. Flowsheet Row Cardiac Rehab from 02/16/2021 in [facility] Cardiac and Pulmonary Rehab  Date 01/13/21  Educator AS  Instruction Review Code 1- Verbalizes Understanding       Falls Prevention: - Provides verbal and written material to individual with discussion of falls prevention and safety. Flowsheet Row Cardiac  Rehab from 02/16/2021 in [facility] Cardiac and Pulmonary Rehab  Date 01/13/21  Educator AS  Instruction Review Code 1- Verbalizes Understanding       Other: -Provides group and verbal instruction on various topics (see comments)   Knowledge Questionnaire Score:   Core Components/Risk Factors/Patient Goals at Admission:  Personal Goals and Risk Factors at Admission - 01/13/21 1559       Core Components/Risk Factors/Patient Goals on Admission    Weight Management Yes;Weight Loss    Intervention Weight Management: Develop a combined nutrition and exercise program designed to reach desired caloric intake, while maintaining appropriate intake of nutrient and fiber, sodium and fats, and appropriate energy expenditure required for the weight goal.;Weight Management: Provide education and appropriate resources to help participant work on and attain dietary goals.;Weight Management/Obesity: Establish reasonable short term and long term weight goals.    Admit Weight 239 lb 3.2 oz (108.5 kg)    Goal Weight: Short Term 225 lb (102.1 kg)    Goal Weight: Long Term 200 lb (90.7 kg)    Expected Outcomes Short Term: Continue to assess and modify interventions until short term weight is achieved;Long Term: Adherence to nutrition and physical activity/exercise program aimed toward attainment of established weight goal;Weight Loss: Understanding of general recommendations for a balanced deficit meal plan, which promotes 1-2 lb weight loss per week and includes a negative energy balance of [phone] kcal/d;Understanding of distribution of calorie intake throughout the day with the consumption of 4-5 meals/snacks;Understanding recommendations for meals to include 15-35% energy as protein, 25-35% energy from fat, 35-60% energy from carbohydrates, less than 232m of dietary cholesterol, 20-35 gm of total fiber daily    Improve shortness of breath with ADL's Yes    Intervention  Provide education, individualized exercise  plan and daily activity instruction to help decrease symptoms of SOB with activities of daily living.    Expected Outcomes Short Term: Improve cardiorespiratory fitness to achieve a reduction of symptoms when performing ADLs;Long Term: Be able to perform more ADLs without symptoms or delay the onset of symptoms    Diabetes Yes    Intervention Provide education about signs/symptoms and action to take for hypo/hyperglycemia.;Provide education about proper nutrition, including hydration, and aerobic/resistive exercise prescription along with prescribed medications to achieve blood glucose in normal ranges: Fasting glucose 65-99 mg/dL    Expected Outcomes Short Term: Participant verbalizes understanding of the signs/symptoms and immediate care of hyper/hypoglycemia, proper foot care and importance of medication, aerobic/resistive exercise and nutrition plan for blood glucose control.;Long Term: Attainment of HbA1C < 7%.    Heart Failure Yes    Intervention Provide a combined exercise and nutrition program that is supplemented with education, support and counseling about heart failure. Directed toward relieving symptoms such as shortness of breath, decreased exercise tolerance, and extremity edema.    Expected Outcomes Improve functional capacity of life;Short term: Attendance in program 2-3 days a week with increased exercise capacity. Reported lower sodium intake. Reported increased fruit and vegetable intake. Reports medication compliance.;Short term: Daily weights obtained and reported for increase. Utilizing diuretic protocols set by physician.;Long term: Adoption of self-care skills and reduction of barriers for early signs and symptoms recognition and intervention leading to self-care maintenance.             Education:Diabetes - Individual verbal and written instruction to review signs/symptoms of diabetes, desired ranges of glucose level fasting, after meals and with exercise. Acknowledge that  pre and post exercise glucose checks will be done for 3 sessions at entry of program. [place] from 02/16/2021 in [facility] Cardiac and Pulmonary Rehab  Date 01/13/21  Educator AS  Instruction Review Code 1- Verbalizes Understanding       Core Components/Risk Factors/Patient Goals Review:   Goals and Risk Factor Review     Row Name 02/16/21 1116             Core Components/Risk Factors/Patient Goals Review   Personal Goals Review Diabetes;Improve shortness of breath with ADL's;Weight Management/Obesity       Review Nicholas Horton's weight is steady.  He states he feels better and is getting energy back.   He checks BG at home a couple times a week.       Expected Outcomes Short: continue to check BG at home Long: manage risk factors long term                Core Components/Risk Factors/Patient Goals at Discharge (Final Review):   Goals and Risk Factor Review - 02/16/21 1116       Core Components/Risk Factors/Patient Goals Review   Personal Goals Review Diabetes;Improve shortness of breath with ADL's;Weight Management/Obesity    Review Nicholas Horton's weight is steady.  He states he feels better and is getting energy back.   He checks BG at home a couple times a week.    Expected Outcomes Short: continue to check BG at home Long: manage risk factors long term             ITP Comments:  ITP Comments     Row Name 12/08/20 1555 01/13/21 1605 01/24/21 1617 02/02/21 0616 02/07/21 1503   ITP Comments Initial telephone orientation completed. Diagnosis can be found in [facility] 6/21. EP orientation scheduled for Thursday 7/21  at 2pm. Completed 6MWT and gym orientation. Initial ITP created and sent for review to Dr. Emily Filbert, Medical Director. First full day of exercise!  Patient was oriented to gym and equipment including functions, settings, policies, and procedures.  Patient's individual exercise prescription and treatment plan were reviewed.  All starting workloads were  established based on the results of the 6 minute walk test done at initial orientation visit.  The plan for exercise progression was also introduced and progression will be customized based on patient's performance and goals. 30 Day review completed. Medical Director ITP review done, changes made as directed, and signed approval by Medical Director. Nicholas Horton was told to stay home today as he had been having cold like symptoms.  He was also encouraged to get tested for COVID.    [place] Name 03/02/21 0858           ITP Comments 30 day review completed. ITP sent to Dr. Emily Filbert, Medical Director of Cardiac Rehab. Continue with ITP unless changes are made by physician.                Comments: 30 day review

## 2021-03-04 ENCOUNTER — Other Ambulatory Visit: Payer: Self-pay

## 2021-03-04 ENCOUNTER — Encounter: Payer: Medicare HMO | Admitting: *Deleted

## 2021-03-04 DIAGNOSIS — I5022 Chronic systolic (congestive) heart failure: Secondary | ICD-10-CM

## 2021-03-04 NOTE — Progress Notes (Signed)
Daily Session Note  Patient Details  Name: Nicholas Horton MRN: [id number] Date of Birth: 05/02/1976 Referring Provider:   Flowsheet Row Cardiac Rehab from 01/13/2021 in [facility] Cardiac and Pulmonary Rehab  Referring Provider Newman Pies       Encounter Date: 03/04/2021  Check In:  Session Check In - 03/04/21 1124       Check-In   Supervising physician immediately available to respond to emergencies See telemetry face sheet for immediately available ER MD    Location [facility]-Cardiac & Pulmonary Rehab    Staff Present Renita Papa, RN BSN;Joseph [place], RCP,RRT,BSRT;Jessica [place], [place], RCEP, CCRP, CCET    Virtual Visit No    Medication changes reported     No    Fall or balance concerns reported    No    Warm-up and Cool-down Performed on first and last piece of equipment    Resistance Training Performed Yes    VAD Patient? No    PAD/SET Patient? No      Pain Assessment   Currently in Pain? No/denies                Social History   Tobacco Use  Smoking Status Former   Packs/day: 2.00   Years: 20.00   Pack years: 40.00   Types: Cigarettes  Smokeless Tobacco Former  Tobacco Comments   quit 10/04/14    Goals Met:  Independence with exercise equipment Exercise tolerated well No report of concerns or symptoms today Strength training completed today  Goals Unmet:  Not Applicable  Comments: Pt able to follow exercise prescription today without complaint.  Will continue to monitor for progression.  Reviewed home exercise with pt today.  Pt plans to walk for exercise.  Reviewed THR, pulse, RPE, sign and symptoms, pulse oximetery and when to call 911 or MD.  Also discussed weather considerations and indoor options.  Pt voiced understanding.   Dr. Emily Filbert is Medical Director for [place].  Dr. Ottie Glazier is Medical Director for [facility] Pulmonary Rehabilitation.

## 2021-03-09 ENCOUNTER — Other Ambulatory Visit: Payer: Self-pay

## 2021-03-09 DIAGNOSIS — I5022 Chronic systolic (congestive) heart failure: Secondary | ICD-10-CM

## 2021-03-09 NOTE — Progress Notes (Signed)
Daily Session Note  Patient Details  Name: Nicholas Horton MRN: [id number] Date of Birth: 1975/06/01 Referring Provider:   Flowsheet Row Cardiac Rehab from 01/13/2021 in [facility] Cardiac and Pulmonary Rehab  Referring Provider Newman Pies       Encounter Date: 03/09/2021  Check In:  Session Check In - 03/09/21 1047       Check-In   Supervising physician immediately available to respond to emergencies See telemetry face sheet for immediately available ER MD    Location [facility]-Cardiac & Pulmonary Rehab    Staff Present Birdie Sons, MPA, Elveria Rising, BA, ACSM CEP, Exercise Physiologist;Joseph Tessie Fass, Cristopher Estimable, RN BSN    Virtual Visit No    Medication changes reported     No    Fall or balance concerns reported    No    Tobacco Cessation No Change    Warm-up and Cool-down Performed on first and last piece of equipment    Resistance Training Performed Yes    VAD Patient? No    PAD/SET Patient? No      Pain Assessment   Currently in Pain? No/denies                Social History   Tobacco Use  Smoking Status Former   Packs/day: 2.00   Years: 20.00   Pack years: 40.00   Types: Cigarettes  Smokeless Tobacco Former  Tobacco Comments   quit 10/04/14    Goals Met:  Independence with exercise equipment Exercise tolerated well No report of concerns or symptoms today Strength training completed today  Goals Unmet:  Not Applicable  Comments: Pt able to follow exercise prescription today without complaint.  Will continue to monitor for progression.    Dr. Emily Filbert is Medical Director for [place].  Dr. Ottie Glazier is Medical Director for [facility] Pulmonary Rehabilitation.

## 2021-03-16 ENCOUNTER — Other Ambulatory Visit: Payer: Self-pay

## 2021-03-16 DIAGNOSIS — I5022 Chronic systolic (congestive) heart failure: Secondary | ICD-10-CM

## 2021-03-16 NOTE — Progress Notes (Signed)
Daily Session Note  Patient Details  Name: Hassan Blackshire MRN: [id number] Date of Birth: 16-Sep-1975 Referring Provider:   Flowsheet Row Cardiac Rehab from 01/13/2021 in [facility] Cardiac and Pulmonary Rehab  Referring Provider Newman Pies       Encounter Date: 03/16/2021  Check In:  Session Check In - 03/16/21 1057       Check-In   Supervising physician immediately available to respond to emergencies See telemetry face sheet for immediately available ER MD    Location [facility]-Cardiac & Pulmonary Rehab    Staff Present Birdie Sons, MPA, RN;Melissa [place], RDN, LDN;Joseph [place]on, RCP,RRT,BSRT    Virtual Visit No    Medication changes reported     No    Fall or balance concerns reported    No    Tobacco Cessation No Change    Warm-up and Cool-down Performed on first and last piece of equipment    Resistance Training Performed Yes    VAD Patient? No    PAD/SET Patient? No      Pain Assessment   Currently in Pain? No/denies                Social History   Tobacco Use  Smoking Status Former   Packs/day: 2.00   Years: 20.00   Pack years: 40.00   Types: Cigarettes  Smokeless Tobacco Former  Tobacco Comments   quit 10/04/14    Goals Met:  Independence with exercise equipment Exercise tolerated well No report of concerns or symptoms today Strength training completed today  Goals Unmet:  Not Applicable  Comments: Pt able to follow exercise prescription today without complaint.  Will continue to monitor for progression.    Dr. Emily Filbert is Medical Director for [place].  Dr. Ottie Glazier is Medical Director for [facility] Pulmonary Rehabilitation.

## 2021-03-21 ENCOUNTER — Other Ambulatory Visit: Payer: Self-pay

## 2021-03-21 ENCOUNTER — Encounter: Payer: Medicare HMO | Admitting: *Deleted

## 2021-03-21 DIAGNOSIS — I5022 Chronic systolic (congestive) heart failure: Secondary | ICD-10-CM

## 2021-03-21 NOTE — Progress Notes (Signed)
Completed initial RD consultation ?

## 2021-03-21 NOTE — Progress Notes (Signed)
Daily Session Note  Patient Details  Name: Frankie Scipio MRN: [id number] Date of Birth: 1975/12/25 Referring Provider:   Flowsheet Row Cardiac Rehab from 01/13/2021 in [facility] Cardiac and Pulmonary Rehab  Referring Provider Newman Pies       Encounter Date: 03/21/2021  Check In:  Session Check In - 03/21/21 1112       Check-In   Supervising physician immediately available to respond to emergencies See telemetry face sheet for immediately available ER MD    Location [facility]-Cardiac & Pulmonary Rehab    Staff Present Renita Papa, RN Moises Blood, BS, ACSM CEP, Exercise Physiologist;Amanda Oletta Darter, BA, ACSM CEP, Exercise Physiologist;Kelly Rosalia Hammers, MPA, RN    Virtual Visit No    Medication changes reported     No    Fall or balance concerns reported    No    Warm-up and Cool-down Performed on first and last piece of equipment    Resistance Training Performed Yes    VAD Patient? No    PAD/SET Patient? No      Pain Assessment   Currently in Pain? No/denies                Social History   Tobacco Use  Smoking Status Former   Packs/day: 2.00   Years: 20.00   Pack years: 40.00   Types: Cigarettes  Smokeless Tobacco Former  Tobacco Comments   quit 10/04/14    Goals Met:  Independence with exercise equipment Exercise tolerated well No report of concerns or symptoms today Strength training completed today  Goals Unmet:  Not Applicable  Comments: Pt able to follow exercise prescription today without complaint.  Will continue to monitor for progression.    Dr. Emily Filbert is Medical Director for [place].  Dr. Ottie Glazier is Medical Director for [facility] Pulmonary Rehabilitation.

## 2021-03-23 ENCOUNTER — Other Ambulatory Visit: Payer: Self-pay

## 2021-03-23 DIAGNOSIS — I5022 Chronic systolic (congestive) heart failure: Secondary | ICD-10-CM | POA: Diagnosis not present

## 2021-03-23 NOTE — Progress Notes (Signed)
Daily Session Note  Patient Details  Name: Hipolito Martinezlopez MRN: [id number] Date of Birth: 02/08/1976 Referring Provider:   Flowsheet Row Cardiac Rehab from 01/13/2021 in [facility] Cardiac and Pulmonary Rehab  Referring Provider Newman Pies       Encounter Date: 03/23/2021  Check In:  Session Check In - 03/23/21 1040       Check-In   Supervising physician immediately available to respond to emergencies See telemetry face sheet for immediately available ER MD    Location [facility]-Cardiac & Pulmonary Rehab    Staff Present Birdie Sons, MPA, Elveria Rising, BA, ACSM CEP, Exercise Physiologist;Joseph Tessie Fass, [place]    Virtual Visit No    Medication changes reported     No    Fall or balance concerns reported    No    Tobacco Cessation No Change    Warm-up and Cool-down Performed on first and last piece of equipment    Resistance Training Performed Yes    VAD Patient? No    PAD/SET Patient? No      Pain Assessment   Currently in Pain? No/denies                Social History   Tobacco Use  Smoking Status Former   Packs/day: 2.00   Years: 20.00   Pack years: 40.00   Types: Cigarettes  Smokeless Tobacco Former  Tobacco Comments   quit 10/04/14    Goals Met:  Independence with exercise equipment Exercise tolerated well No report of concerns or symptoms today Strength training completed today  Goals Unmet:  Not Applicable  Comments: Pt able to follow exercise prescription today without complaint.  Will continue to monitor for progression.    Dr. Emily Filbert is Medical Director for [place].  Dr. Ottie Glazier is Medical Director for [facility] Pulmonary Rehabilitation.

## 2021-03-25 ENCOUNTER — Encounter: Payer: Medicare HMO | Admitting: *Deleted

## 2021-03-25 ENCOUNTER — Other Ambulatory Visit: Payer: Self-pay

## 2021-03-25 DIAGNOSIS — I5022 Chronic systolic (congestive) heart failure: Secondary | ICD-10-CM

## 2021-03-25 NOTE — Progress Notes (Signed)
Daily Session Note  Patient Details  Name: Nicholas Horton MRN: [id number] Date of Birth: 1975-11-12 Referring Provider:   Flowsheet Row Cardiac Rehab from 01/13/2021 in [facility] Cardiac and Pulmonary Rehab  Referring Provider Newman Pies       Encounter Date: 03/25/2021  Check In:  Session Check In - 03/25/21 1110       Check-In   Supervising physician immediately available to respond to emergencies See telemetry face sheet for immediately available ER MD    Location [facility]-Cardiac & Pulmonary Rehab    Staff Present Renita Papa, RN BSN;Joseph [place], RCP,RRT,BSRT;Jessica [place], [place], RCEP, CCRP, CCET    Virtual Visit No    Medication changes reported     No    Fall or balance concerns reported    No    Warm-up and Cool-down Performed on first and last piece of equipment    Resistance Training Performed Yes    VAD Patient? No    PAD/SET Patient? No      Pain Assessment   Currently in Pain? No/denies                Social History   Tobacco Use  Smoking Status Former   Packs/day: 2.00   Years: 20.00   Pack years: 40.00   Types: Cigarettes  Smokeless Tobacco Former  Tobacco Comments   quit 10/04/14    Goals Met:  Independence with exercise equipment Exercise tolerated well No report of concerns or symptoms today Strength training completed today  Goals Unmet:  Not Applicable  Comments: Pt able to follow exercise prescription today without complaint.  Will continue to monitor for progression.    Dr. Emily Filbert is Medical Director for [place].  Dr. Ottie Glazier is Medical Director for [facility] Pulmonary Rehabilitation.

## 2021-03-30 ENCOUNTER — Encounter: Payer: Self-pay | Admitting: *Deleted

## 2021-03-30 DIAGNOSIS — I5022 Chronic systolic (congestive) heart failure: Secondary | ICD-10-CM

## 2021-03-30 NOTE — Progress Notes (Signed)
Cardiac Individual Treatment Plan  Patient Details  Name: Nicholas Horton MRN: [id number] Date of Birth: 10-Jul-1975 Referring Provider:   Flowsheet Row Cardiac Rehab from 01/13/2021 in [facility] Cardiac and Pulmonary Rehab  Referring Provider Newman Pies       Initial Encounter Date:  Flowsheet Row Cardiac Rehab from 01/13/2021 in [facility] Cardiac and Pulmonary Rehab  Date 01/13/21       Visit Diagnosis: Heart failure, chronic systolic ([place])  Patient's Home Medications on Admission:  Current Outpatient Medications:    albuterol (VENTOLIN HFA) 108 (90 Base) MCG/ACT inhaler, Inhale into the lungs., Disp: , Rfl:    ARIPiprazole (ABILIFY) 10 MG tablet, Take 1 tablet (10 mg total) by mouth daily., Disp: 30 tablet, Rfl: 1   artificial tears (LACRILUBE) OINT ophthalmic ointment, Place 1 application into both eyes every 8 (eight) hours. (Patient not taking: Reported on 12/08/2020), Disp: 1 Tube, Rfl: 0   aspirin 81 MG chewable tablet, Place 1 tablet (81 mg total) into feeding tube daily. (Patient not taking: Reported on 12/08/2020), Disp: 1 tablet, Rfl: 0   buPROPion (WELLBUTRIN SR) 150 MG 12 hr tablet, Take 3 tablets (450 mg total) by mouth daily., Disp: 90 tablet, Rfl: 1   dapagliflozin propanediol (FARXIGA) 10 MG TABS tablet, Take 1 tablet by mouth daily., Disp: , Rfl:    diltiazem (CARDIZEM CD) 120 MG 24 hr capsule, Take 1 capsule (120 mg total) by mouth daily. OFFICE VISIT NEEDED, Disp: 30 capsule, Rfl: 1   furosemide (LASIX) 20 MG tablet, Take 1 tablet (20 mg total) by mouth daily., Disp: 30 tablet, Rfl: 1   Glycopyrrolate-Formoterol (BEVESPI AEROSPHERE) 9-4.8 MCG/ACT AERO, INHALE 2 PUFFS INTO THE LUNGS TWICE A DAY, Disp: , Rfl:    pantoprazole sodium (PROTONIX) 40 mg/20 mL PACK, Place 20 mLs (40 mg total) into feeding tube daily. (Patient not taking: Reported on 12/08/2020), Disp: 30 each, Rfl: 0   polyethylene glycol (MIRALAX / GLYCOLAX) packet, Place 17 g into feeding tube daily. (Patient not taking:  Reported on 12/08/2020), Disp: 14 each, Rfl: 0   senna-docusate (SENOKOT-S) 8.6-50 MG tablet, Place 2 tablets into feeding tube 2 (two) times daily. (Patient not taking: Reported on 12/08/2020), Disp: 2 tablet, Rfl: 0   spironolactone (ALDACTONE) 25 MG tablet, Take 1 tablet (25 mg total) by mouth daily., Disp: 30 tablet, Rfl: 1   tadalafil (CIALIS) 20 MG tablet, Take by mouth., Disp: , Rfl:   Past Medical History: Past Medical History:  Diagnosis Date   CHF (congestive heart failure) ([place])    Congenital bicuspid aortic valve 09/2014   - s/p AVR   Nonischemic cardiomyopathy ([facility]) -- Resolved[I42.9] 09/2014   EF by Echo 20-25% (pre-op AVR) --> Echo 10/2014 & 03/2015: EF 50-55% - also Recent Normal Diastolic parameters    S/P AVR (aortic valve replacement) 10/2014   21 mm Carbometrics Mechanical Valve -- Epicardial PPM Leads   Severe aortic stenosis 10/04/2014   Presented with Syncope & CHF    Tobacco Use: Social History   Tobacco Use  Smoking Status Former   Packs/day: 2.00   Years: 20.00   Pack years: 40.00   Types: Cigarettes  Smokeless Tobacco Former  Tobacco Comments   quit 10/04/14    Labs: Recent Review Flowsheet Data     Labs for ITP Cardiac and Pulmonary Rehab Latest Ref Rng & Units 03/24/2018 03/24/2018 03/24/2018 03/24/2018 01/17/2021   Cholestrol 0 - 200 mg/dL - - - - 157   LDLCALC 0 - 99 mg/dL - - - -  60   HDL >40 mg/dL - - - - 35(L)   Trlycerides <150 mg/dL - - - - 312(H)   Hemoglobin A1c <5.7 % of total Hgb - - - - -   PHART 7.350 - 7.450 7.46(H) 7.31(L) 7.33(L) 7.49(H) -   PCO2ART 32.0 - 48.0 mmHg 60(H) 88(HH) 79(HH) 47 -   HCO3 20.0 - 28.0 mmol/L 42.7(H) 44.3(H) 41.7(H) 35.8(H) -   TCO2 0 - 100 mmol/L - - - - -   ACIDBASEDEF 0.0 - 2.0 mmol/L - - - - -   O2SAT % 93.5 85.1 99.2 98.8 -        Exercise Target Goals: Exercise Program Goal: Individual exercise prescription set using results from initial 6 min walk test and THRR while considering  patient's activity  barriers and safety.   Exercise Prescription Goal: Initial exercise prescription builds to 30-45 minutes a day of aerobic activity, 2-3 days per week.  Home exercise guidelines will be given to patient during program as part of exercise prescription that the participant will acknowledge.   Education: Aerobic Exercise: - Group verbal and visual presentation on the components of exercise prescription. Introduces F.I.T.T principle from ACSM for exercise prescriptions.  Reviews F.I.T.T. principles of aerobic exercise including progression. Written material given at graduation. Flowsheet Row Cardiac Rehab from 03/23/2021 in [facility] Cardiac and Pulmonary Rehab  Date 03/23/21  Educator [facility]  Instruction Review Code 1- Verbalizes Understanding       Education: Resistance Exercise: - Group verbal and visual presentation on the components of exercise prescription. Introduces F.I.T.T principle from ACSM for exercise prescriptions  Reviews F.I.T.T. principles of resistance exercise including progression. Written material given at graduation. Flowsheet Row Cardiac Rehab from 03/23/2021 in [facility] Cardiac and Pulmonary Rehab  Date 01/26/21  Educator [facility]  Instruction Review Code 1- Verbalizes Understanding        Education: Exercise & Equipment Safety: - Individual verbal instruction and demonstration of equipment use and safety with use of the equipment. Flowsheet Row Cardiac Rehab from 03/23/2021 in [facility] Cardiac and Pulmonary Rehab  Date 01/13/21  Educator [facility]  Instruction Review Code 1- Verbalizes Understanding       Education: Exercise Physiology & General Exercise Guidelines: - Group verbal and written instruction with models to review the exercise physiology of the cardiovascular system and associated critical values. Provides general exercise guidelines with specific guidelines to those with heart or lung disease.    Education: Flexibility, Balance, Mind/Body Relaxation: - Group verbal and  visual presentation with interactive activity on the components of exercise prescription. Introduces F.I.T.T principle from ACSM for exercise prescriptions. Reviews F.I.T.T. principles of flexibility and balance exercise training including progression. Also discusses the mind body connection.  Reviews various relaxation techniques to help reduce and manage stress (i.e. Deep breathing, progressive muscle relaxation, and visualization). Balance handout provided to take home. Written material given at graduation.   Activity Barriers & Risk Stratification:  Activity Barriers & Cardiac Risk Stratification - 12/08/20 1533       Activity Barriers & Cardiac Risk Stratification   Activity Barriers None    Cardiac Risk Stratification High             6 Minute Walk:  6 Minute Walk     Row Name 01/13/21 1544         6 Minute Walk   Phase Initial     Distance 1180 feet     Walk Time 6 minutes     # of Rest Breaks 0  MPH 2.2     METS 3.68     RPE 9     Perceived Dyspnea  1     VO2 Peak 12.9     Symptoms No     Resting HR 77 bpm     Resting BP 102/58     Resting Oxygen Saturation  94 %     Exercise Oxygen Saturation  during 6 min walk 94 %     Max Ex. HR 88 bpm     Max Ex. BP 118/72     2 Minute Post BP 102/66              Oxygen Initial Assessment:   Oxygen Re-Evaluation:   Oxygen Discharge (Final Oxygen Re-Evaluation):   Initial Exercise Prescription:  Initial Exercise Prescription - 01/13/21 1500       Date of Initial Exercise RX and Referring Provider   Date 01/13/21    Referring Provider Newman Pies      Treadmill   MPH 2.2    Grade 1.5    Minutes 15    METs 3.2      REL-XR   Level 3    Speed 50    Minutes 15    METs 3.2      T5 Nustep   Level 2    SPM 80    Minutes 15    METs 3.2      Prescription Details   Frequency (times per week) 3    Duration Progress to 30 minutes of continuous aerobic without signs/symptoms of physical distress       Intensity   THRR 40-80% of Max Heartrate 116-156    Ratings of Perceived Exertion 11-13    Perceived Dyspnea 0-4      Resistance Training   Training Prescription Yes    Weight 3 lb    Reps 10-15             Perform Capillary Blood Glucose checks as needed.  Exercise Prescription Changes:   Exercise Prescription Changes     Row Name 01/13/21 1500 02/07/21 1500 02/21/21 0800 03/04/21 1100 03/07/21 1000     Response to Exercise   Blood Pressure (Admit) 102/58 110/60 150/72 -- 110/68   Blood Pressure (Exercise) 118/72 136/64 -- -- --   Blood Pressure (Exit) 102/66 104/60 118/68 -- 124/72   Heart Rate (Admit) 77 bpm 58 bpm 66 bpm -- 57 bpm   Heart Rate (Exercise) 88 bpm 103 bpm 114 bpm -- 101 bpm   Heart Rate (Exit) 83 bpm 72 bpm 83 bpm -- 70 bpm   Oxygen Saturation (Admit) 94 % -- -- -- --   Oxygen Saturation (Exercise) 94 % -- -- -- --   Rating of Perceived Exertion (Exercise) _0 -- 14   Perceived Dyspnea (Exercise) 1 -- -- -- --   Symptoms none none none -- none   Duration -- Progress to 30 minutes of  aerobic without signs/symptoms of physical distress Progress to 30 minutes of  aerobic without signs/symptoms of physical distress -- Continue with 30 min of aerobic exercise without signs/symptoms of physical distress.   Intensity -- THRR unchanged THRR unchanged -- THRR unchanged     Progression   Progression -- Continue to progress workloads to maintain intensity without signs/symptoms of physical distress. Continue to progress workloads to maintain intensity without signs/symptoms of physical distress. -- Continue to progress workloads to maintain intensity without signs/symptoms of physical distress.   Average METs -- 2.88  3.41 -- 5.08     Resistance Training   Training Prescription -- Yes Yes -- Yes   Weight -- 3 lb 3 lb -- 6 lb   Reps -- 10-15 10-15 -- 10-15     Interval Training   Interval Training -- No No -- No     Treadmill   MPH -- 2.2 3 -- 3   Grade  -- 1.5 2 -- 10   Minutes -- 15 15 -- 15   METs -- 3.14 4.12 -- 7.4     Recumbant Bike   Level -- -- -- -- 9   Watts -- -- -- -- 60   Minutes -- -- -- -- 15   METs -- -- -- -- 2.73     NuStep   Level -- 4 -- -- --   Minutes -- 15 -- -- --   METs -- 3.2 -- -- --     REL-XR   Level -- 3 3.5 -- --   Minutes -- 15 15 -- --   METs -- 2.3 2.7 -- --     Home Exercise Plan   Plans to continue exercise at -- -- -- Home (comment)  walk Home (comment)  walk   Frequency -- -- -- Add 2 additional days to program exercise sessions. Add 2 additional days to program exercise sessions.   Initial Home Exercises Provided -- -- -- 03/04/21 03/04/21    Row Name 03/21/21 0800             Response to Exercise   Blood Pressure (Admit) 118/70       Blood Pressure (Exit) 110/62       Heart Rate (Admit) 70 bpm       Heart Rate (Exercise) 110 bpm       Heart Rate (Exit) 79 bpm       Rating of Perceived Exertion (Exercise) 13       Symptoms none       Duration Continue with 30 min of aerobic exercise without signs/symptoms of physical distress.       Intensity THRR unchanged         Progression   Progression Continue to progress workloads to maintain intensity without signs/symptoms of physical distress.       Average METs 5.9         Resistance Training   Training Prescription Yes       Weight 6 lb       Reps 10-15         Interval Training   Interval Training No         Treadmill   MPH 3       Grade 10       Minutes 15       METs 7.4         REL-XR   Level 10       Minutes 15         Home Exercise Plan   Plans to continue exercise at Home (comment)  walk       Frequency Add 2 additional days to program exercise sessions.       Initial Home Exercises Provided 03/04/21                Exercise Comments:   Exercise Comments     Row Name 01/24/21 1617           Exercise Comments First full day of exercise!  Patient was oriented to  gym and equipment including  functions, settings, policies, and procedures.  Patient's individual exercise prescription and treatment plan were reviewed.  All starting workloads were established based on the results of the 6 minute walk test done at initial orientation visit.  The plan for exercise progression was also introduced and progression will be customized based on patient's performance and goals.                Exercise Goals and Review:   Exercise Goals     Row Name 01/13/21 1551             Exercise Goals   Increase Physical Activity Yes       Intervention Provide advice, education, support and counseling about physical activity/exercise needs.;Develop an individualized exercise prescription for aerobic and resistive training based on initial evaluation findings, risk stratification, comorbidities and participant's personal goals.       Expected Outcomes Short Term: Attend rehab on a regular basis to increase amount of physical activity.;Long Term: Add in home exercise to make exercise part of routine and to increase amount of physical activity.;Long Term: Exercising regularly at least 3-5 days a week.       Increase Strength and Stamina Yes       Intervention Provide advice, education, support and counseling about physical activity/exercise needs.;Develop an individualized exercise prescription for aerobic and resistive training based on initial evaluation findings, risk stratification, comorbidities and participant's personal goals.       Expected Outcomes Short Term: Increase workloads from initial exercise prescription for resistance, speed, and METs.;Short Term: Perform resistance training exercises routinely during rehab and add in resistance training at home;Long Term: Improve cardiorespiratory fitness, muscular endurance and strength as measured by increased METs and functional capacity (6MWT)       Able to understand and use rate of perceived exertion (RPE) scale Yes       Intervention Provide  education and explanation on how to use RPE scale       Expected Outcomes Short Term: Able to use RPE daily in rehab to express subjective intensity level;Long Term:  Able to use RPE to guide intensity level when exercising independently       Able to understand and use Dyspnea scale Yes       Intervention Provide education and explanation on how to use Dyspnea scale       Expected Outcomes Short Term: Able to use Dyspnea scale daily in rehab to express subjective sense of shortness of breath during exertion;Long Term: Able to use Dyspnea scale to guide intensity level when exercising independently       Knowledge and understanding of Target Heart Rate Range (THRR) Yes       Intervention Provide education and explanation of THRR including how the numbers were predicted and where they are located for reference       Expected Outcomes Short Term: Able to state/look up THRR;Short Term: Able to use daily as guideline for intensity in rehab;Long Term: Able to use THRR to govern intensity when exercising independently       Able to check pulse independently Yes       Intervention Provide education and demonstration on how to check pulse in carotid and radial arteries.;Review the importance of being able to check your own pulse for safety during independent exercise       Expected Outcomes Short Term: Able to explain why pulse checking is important during independent exercise;Long Term: Able to check pulse independently and accurately  Understanding of Exercise Prescription Yes                Exercise Goals Re-Evaluation :  Exercise Goals Re-Evaluation     Row Name 01/24/21 1617 02/07/21 1505 02/21/21 0832 03/04/21 1130 03/07/21 1021     Exercise Goal Re-Evaluation   Exercise Goals Review Increase Physical Activity;Able to understand and use rate of perceived exertion (RPE) scale;Knowledge and understanding of Target Heart Rate Range (THRR);Understanding of Exercise Prescription;Increase  Strength and Stamina;Able to understand and use Dyspnea scale;Able to check pulse independently Increase Physical Activity;Increase Strength and Stamina;Understanding of Exercise Prescription Increase Physical Activity;Increase Strength and Stamina Increase Physical Activity;Able to understand and use rate of perceived exertion (RPE) scale;Knowledge and understanding of Target Heart Rate Range (THRR);Understanding of Exercise Prescription;Increase Strength and Stamina;Able to check pulse independently Increase Physical Activity;Increase Strength and Stamina   Comments Reviewed RPE and dyspnea scales, THR and program prescription with pt today.  Pt voiced understanding and was given a copy of goals to take home. Amarien is off to a good start in rehab.  He is now on level 4 on the NuStep.  We will continue to monitor his progress. Chevy is doing well and has increased speed and grade on TM.  Staff will encourage increasing weights for strength work Reviewed home exercise with pt today.  Pt plans to walk for exercise.  Reviewed THR, pulse, RPE, sign and symptoms, pulse oximetery and when to call 911 or MD.  Also discussed weather considerations and indoor options.  Pt voiced understanding. Cleve is doing well in rehab. He has built up to 6 lbs for handweights and level 9 on the recumbant bike and reached 60 watts. Treadmill incline to up to 10%. Will continue to monitor, along with consistent attendance.   Expected Outcomes Short: Use RPE daily to regulate intensity. Long: Follow program prescription in THR. Short: Increase treadmill. Long: Continue to improve stamina Short: increase weights Long:improve average MET level Short: add 2 extra days of exercise. Long: independently exercise after graduation Short: Continue to increase handweights Long: Continue to increase overall MET level    Row Name 03/21/21 0844             Exercise Goal Re-Evaluation   Exercise Goals Review Increase Physical  Activity;Increase Strength and Stamina       Comments Braxten continues to do well whe he attends.  He was only here once per week the past 2 weeks.  Staff wil review importance of being consistent.       Expected Outcomes Short: attend 2-3 times per week Long:improve overall stamina                Discharge Exercise Prescription (Final Exercise Prescription Changes):  Exercise Prescription Changes - 03/21/21 0800       Response to Exercise   Blood Pressure (Admit) 118/70    Blood Pressure (Exit) 110/62    Heart Rate (Admit) 70 bpm    Heart Rate (Exercise) 110 bpm    Heart Rate (Exit) 79 bpm    Rating of Perceived Exertion (Exercise) 13    Symptoms none    Duration Continue with 30 min of aerobic exercise without signs/symptoms of physical distress.    Intensity THRR unchanged      Progression   Progression Continue to progress workloads to maintain intensity without signs/symptoms of physical distress.    Average METs 5.9      Resistance Training   Training Prescription Yes  Weight 6 lb    Reps 10-15      Interval Training   Interval Training No      Treadmill   MPH 3    Grade 10    Minutes 15    METs 7.4      REL-XR   Level 10    Minutes 15      Home Exercise Plan   Plans to continue exercise at Home (comment)   walk   Frequency Add 2 additional days to program exercise sessions.    Initial Home Exercises Provided 03/04/21             Nutrition:  Target Goals: Understanding of nutrition guidelines, daily intake of sodium '1500mg'$ , cholesterol '200mg'$ , calories 30% from fat and 7% or less from saturated fats, daily to have 5 or more servings of fruits and vegetables.  Education: All About Nutrition: -Group instruction provided by verbal, written material, interactive activities, discussions, models, and posters to present general guidelines for heart healthy nutrition including fat, fiber, MyPlate, the role of sodium in heart healthy nutrition,  utilization of the nutrition label, and utilization of this knowledge for meal planning. Follow up email sent as well. Written material given at graduation. Flowsheet Row Cardiac Rehab from 03/23/2021 in [facility] Cardiac and Pulmonary Rehab  Date 02/16/21  Educator [facility]  Instruction Review Code 1- Verbalizes Understanding       Biometrics:  Pre Biometrics - 01/13/21 1551       Pre Biometrics   Height 6' 0.5" (1.842 m)    Weight 239 lb 3.2 oz (108.5 kg)    BMI (Calculated) 31.98    Single Leg Stand 30 seconds              Nutrition Therapy Plan and Nutrition Goals:  Nutrition Therapy & Goals - 03/21/21 1030       Nutrition Therapy   Diet Heart healthy, low Na, diabetes frienldy    Drug/Food Interactions Coumadin/Vit K   pt reports taking coumadin   Protein (specify units) 80g    Fiber 30 grams    Whole Grain Foods 3 servings    Saturated Fats 12 max. grams    Fruits and Vegetables 8 servings/day    Sodium 1.5 grams      Personal Nutrition Goals   Nutrition Goal ST: 2-4 CHO per meal (75g CHo is 5 servings - from recall ot seems he would like to keep it on the lower end) LT: improve energy, at least 1 whole grain per day, 8 fruits/vegetables per day, at least 2 CHO servings per meal    Comments He is on a low CHO diet - he defines as <75g/meal, from recall ot seems he would like to keep it on the lower end: He eats a lot of salads and grilled chicken - he has fried chicken sometimes. He will eat hamburgers and all beef hotdogs - rarely has them. He has red meat 3x/week. B: 8 am: cereal (captain crunch with lactose free whole milk) L: [place] sandwich with chips (Arnold keto bread - 8g fiber) S: sugar free jello or atkins protein bar with tea with splenda. D: varies: grilled chicken or friend chicken, hamburger or hamburger steak. He has his protein on the keto bread. 2-3 vegetables: peas, carrots, he doesn't like corn, salads, riced caulifower, small amount of leafy greens with  coumadin. He will sometimes use butter or olive oil. He does not salt his food. Discussed heart healthy eating and diabetes friendly  eaitng.      Intervention Plan   Intervention Nutrition handout(s) given to patient.;Prescribe, educate and counsel regarding individualized specific dietary modifications aiming towards targeted core components such as weight, hypertension, lipid management, diabetes, heart failure and other comorbidities.    Expected Outcomes Short Term Goal: Understand basic principles of dietary content, such as calories, fat, sodium, cholesterol and nutrients.;Short Term Goal: A plan has been developed with personal nutrition goals set during dietitian appointment.;Long Term Goal: Adherence to prescribed nutrition plan.             Nutrition Assessments:  MEDIFICTS Score Key: ?70 Need to make dietary changes  40-70 Heart Healthy Diet ? 40 Therapeutic Level Cholesterol Diet  Flowsheet Row Cardiac Rehab from 01/13/2021 in [facility] Cardiac and Pulmonary Rehab  Picture Your Plate Total Score on Admission 62      Picture Your Plate Scores: <30 Unhealthy dietary pattern with much room for improvement. 41-50 Dietary pattern unlikely to meet recommendations for good health and room for improvement. 51-60 More healthful dietary pattern, with some room for improvement.  >60 Healthy dietary pattern, although there may be some specific behaviors that could be improved.    Nutrition Goals Re-Evaluation:  Nutrition Goals Re-Evaluation     Row Name 02/16/21 1125             Goals   Comment Warren has not met with RD yet.                Nutrition Goals Discharge (Final Nutrition Goals Re-Evaluation):  Nutrition Goals Re-Evaluation - 02/16/21 1125       Goals   Comment Ladell has not met with RD yet.             Psychosocial: Target Goals: Acknowledge presence or absence of significant depression and/or stress, maximize coping skills, provide positive  support system. Participant is able to verbalize types and ability to use techniques and skills needed for reducing stress and depression.   Education: Stress, Anxiety, and Depression - Group verbal and visual presentation to define topics covered.  Reviews how body is impacted by stress, anxiety, and depression.  Also discusses healthy ways to reduce stress and to treat/manage anxiety and depression.  Written material given at graduation. Flowsheet Row Cardiac Rehab from 03/23/2021 in [facility] Cardiac and Pulmonary Rehab  Date 03/09/21  Educator [facility]  Instruction Review Code 1- Verbalizes Understanding       Education: Sleep Hygiene -Provides group verbal and written instruction about how sleep can affect your health.  Define sleep hygiene, discuss sleep cycles and impact of sleep habits. Review good sleep hygiene tips.    Initial Review & Psychosocial Screening:  Initial Psych Review & Screening - 12/08/20 1534       Initial Review   Current issues with Current Sleep Concerns;History of Depression;Current Depression;Current Stress Concerns;Current Psychotropic Meds    Source of Stress Concerns Unable to participate in former interests or hobbies;Unable to perform yard/household activities      Park? Yes   fiance     Barriers   Psychosocial barriers to participate in program There are no identifiable barriers or psychosocial needs.      Screening Interventions   Interventions Encouraged to exercise;Provide feedback about the scores to participant;To provide support and resources with identified psychosocial needs    Expected Outcomes Short Term goal: Utilizing psychosocial counselor, staff and physician to assist with identification of specific Stressors or current issues interfering with healing  process. Setting desired goal for each stressor or current issue identified.;Long Term Goal: Stressors or current issues are controlled or eliminated.;Short Term  goal: Identification and review with participant of any Quality of Life or Depression concerns found by scoring the questionnaire.;Long Term goal: The participant improves quality of Life and PHQ9 Scores as seen by post scores and/or verbalization of changes             Quality of Life Scores:   Quality of Life - 01/13/21 1554       Quality of Life   Select Quality of Life      Quality of Life Scores   Health/Function Pre 9.63 %    Socioeconomic Pre 17.43 %    Psych/Spiritual Pre 9.57 %    Family Pre 15.6 %    GLOBAL Pre 12.1 %            Scores of 19 and below usually indicate a poorer quality of life in these areas.  A difference of  2-3 points is a clinically meaningful difference.  A difference of 2-3 points in the total score of the Quality of Life Index has been associated with significant improvement in overall quality of life, self-image, physical symptoms, and general health in studies assessing change in quality of life.  PHQ-9: Recent Review Flowsheet Data     Depression screen [facility] 2/9 02/16/2021 01/13/2021 02/18/2018 12/19/2017   Decreased Interest 1 2 2 2    Down, Depressed, Hopeless 1 3  2 2    PHQ - 2 Score 2 5 4 4    Altered sleeping 2 2 2 3    Tired, decreased energy 1 2 2 3    Change in appetite 2 1 3 2    Feeling bad or failure about yourself  2 3 2 2    Trouble concentrating 1 0 2 3   Moving slowly or fidgety/restless 1 1 2 2    Suicidal thoughts 0 3  0 1   PHQ-9 Score 11 17 17 20    Difficult doing work/chores Somewhat difficult Somewhat difficult Extremely dIfficult Somewhat difficult      Interpretation of Total Score  Total Score Depression Severity:  1-4 = Minimal depression, 5-9 = Mild depression, 10-14 = Moderate depression, 15-19 = Moderately severe depression, 20-27 = Severe depression   Psychosocial Evaluation and Intervention:  Psychosocial Evaluation - 12/08/20 1549       Psychosocial Evaluation & Interventions   Interventions Encouraged to  exercise with the program and follow exercise prescription;Stress management education;Relaxation education    Comments Nickolus is coming to cardiac rehab for Systolic HF. He has had issues with his heart since 2016 when he got an AVR. Since then, he has gotten a pacemaker, developed COPD, is being worked up for a possible ICD, and is also a diabetic. He is on treatment for his depression and reports it works "most of the time" and his fiance helps keep his mood more level by being attentive to his mood and talking things through. His biggest goal during this program is to lose weight as he feels this will really help his heart. His main motivation for maintaining a heart healthy lifestyle is his 1 year old daughter. He states his sleep is very irregular, most nights going to bed very late and sleeping late. He tries to do things around the house even though he doesn't want to and he notices his shortness of breath which leads to chest discomfort happens during a lot of physical activity. He has  been trying to walk more but the summer heat has made that difficult.    Expected Outcomes Short: attend cardiac rehab for education and exercise. Long: develop and maintain positive self care habits.    Continue Psychosocial Services  Follow up required by staff             Psychosocial Re-Evaluation:  Psychosocial Re-Evaluation     [place] Name 02/16/21 1119 03/21/21 1117           Psychosocial Re-Evaluation   Current issues with Current Stress Concerns;Current Psychotropic Meds;History of Depression;Current Sleep Concerns Current Stress Concerns;History of Depression;Current Sleep Concerns      Comments Reviewed PHQ-9 with patient and score has improved.  He states he feels better overall.  He has trouble with sleep.  We reveiwed not having caffeine on the afternoon, as well not not eating too late, not using phone or computer right before bed.  He reports taking meds as directed. Melford takes Wellbutrin  for depression.  He reports sleeping a little better.  He can nap if he has a night he doesnt sleep well.  He is not seeing a counselor currently but pplans to check into it.      Expected Outcomes Short: work on better sleep habits Long:  be able to sleep better consistently, exercise to help with symptoms of depression and anxiety Short: continue to work on sleep patterns and exercise to help with stress/depression Long: manage stress/depression with help of meds and exercise               Psychosocial Discharge (Final Psychosocial Re-Evaluation):  Psychosocial Re-Evaluation - 03/21/21 1117       Psychosocial Re-Evaluation   Current issues with Current Stress Concerns;History of Depression;Current Sleep Concerns    Comments Lukas takes Wellbutrin for depression.  He reports sleeping a little better.  He can nap if he has a night he doesnt sleep well.  He is not seeing a counselor currently but pplans to check into it.    Expected Outcomes Short: continue to work on sleep patterns and exercise to help with stress/depression Long: manage stress/depression with help of meds and exercise             Vocational Rehabilitation: Provide vocational rehab assistance to qualifying candidates.   Vocational Rehab Evaluation & Intervention:  Vocational Rehab - 12/08/20 1543       Initial Vocational Rehab Evaluation & Intervention   Assessment shows need for Vocational Rehabilitation No             Education: Education Goals: Education classes will be provided on a variety of topics geared toward better understanding of heart health and risk factor modification. Participant will state understanding/return demonstration of topics presented as noted by education test scores.  Learning Barriers/Preferences:  Learning Barriers/Preferences - 12/08/20 1542       Learning Barriers/Preferences   Learning Barriers None    Learning Preferences None             General Cardiac  Education Topics:  AED/CPR: - Group verbal and written instruction with the use of models to demonstrate the basic use of the AED with the basic ABC's of resuscitation.   Anatomy and Cardiac Procedures: - Group verbal and visual presentation and models provide information about basic cardiac anatomy and function. Reviews the testing methods done to diagnose heart disease and the outcomes of the test results. Describes the treatment choices: Medical Management, Angioplasty, or Coronary Bypass Surgery for treating various  heart conditions including Myocardial Infarction, Angina, Valve Disease, and Cardiac Arrhythmias.  Written material given at graduation. Flowsheet Row Cardiac Rehab from 03/23/2021 in [facility] Cardiac and Pulmonary Rehab  Date 01/26/21  Educator [facility]  Instruction Review Code 1- Verbalizes Understanding       Medication Safety: - Group verbal and visual instruction to review commonly prescribed medications for heart and lung disease. Reviews the medication, class of the drug, and side effects. Includes the steps to properly store meds and maintain the prescription regimen.  Written material given at graduation.   Intimacy: - Group verbal instruction through game format to discuss how heart and lung disease can affect sexual intimacy. Written material given at graduation.. Flowsheet Row Cardiac Rehab from 03/23/2021 in [facility] Cardiac and Pulmonary Rehab  Date 03/23/21  Educator [facility]  Instruction Review Code 1- Verbalizes Understanding       Know Your Numbers and Heart Failure: - Group verbal and visual instruction to discuss disease risk factors for cardiac and pulmonary disease and treatment options.  Reviews associated critical values for Overweight/Obesity, Hypertension, Cholesterol, and Diabetes.  Discusses basics of heart failure: signs/symptoms and treatments.  Introduces Heart Failure Zone chart for action plan for heart failure.  Written material given at  graduation.   Infection Prevention: - Provides verbal and written material to individual with discussion of infection control including proper hand washing and proper equipment cleaning during exercise session. Flowsheet Row Cardiac Rehab from 03/23/2021 in [facility] Cardiac and Pulmonary Rehab  Date 01/13/21  Educator AS  Instruction Review Code 1- Verbalizes Understanding       Falls Prevention: - Provides verbal and written material to individual with discussion of falls prevention and safety. Flowsheet Row Cardiac Rehab from 03/23/2021 in [facility] Cardiac and Pulmonary Rehab  Date 01/13/21  Educator AS  Instruction Review Code 1- Verbalizes Understanding       Other: -Provides group and verbal instruction on various topics (see comments)   Knowledge Questionnaire Score:   Core Components/Risk Factors/Patient Goals at Admission:  Personal Goals and Risk Factors at Admission - 01/13/21 1559       Core Components/Risk Factors/Patient Goals on Admission    Weight Management Yes;Weight Loss    Intervention Weight Management: Develop a combined nutrition and exercise program designed to reach desired caloric intake, while maintaining appropriate intake of nutrient and fiber, sodium and fats, and appropriate energy expenditure required for the weight goal.;Weight Management: Provide education and appropriate resources to help participant work on and attain dietary goals.;Weight Management/Obesity: Establish reasonable short term and long term weight goals.    Admit Weight 239 lb 3.2 oz (108.5 kg)    Goal Weight: Short Term 225 lb (102.1 kg)    Goal Weight: Long Term 200 lb (90.7 kg)    Expected Outcomes Short Term: Continue to assess and modify interventions until short term weight is achieved;Long Term: Adherence to nutrition and physical activity/exercise program aimed toward attainment of established weight goal;Weight Loss: Understanding of general recommendations for a balanced  deficit meal plan, which promotes 1-2 lb weight loss per week and includes a negative energy balance of [phone] kcal/d;Understanding of distribution of calorie intake throughout the day with the consumption of 4-5 meals/snacks;Understanding recommendations for meals to include 15-35% energy as protein, 25-35% energy from fat, 35-60% energy from carbohydrates, less than $RemoveB'200mg'HxfsTGfv$  of dietary cholesterol, 20-35 gm of total fiber daily    Improve shortness of breath with ADL's Yes    Intervention Provide education, individualized exercise plan and daily  activity instruction to help decrease symptoms of SOB with activities of daily living.    Expected Outcomes Short Term: Improve cardiorespiratory fitness to achieve a reduction of symptoms when performing ADLs;Long Term: Be able to perform more ADLs without symptoms or delay the onset of symptoms    Diabetes Yes    Intervention Provide education about signs/symptoms and action to take for hypo/hyperglycemia.;Provide education about proper nutrition, including hydration, and aerobic/resistive exercise prescription along with prescribed medications to achieve blood glucose in normal ranges: Fasting glucose 65-99 mg/dL    Expected Outcomes Short Term: Participant verbalizes understanding of the signs/symptoms and immediate care of hyper/hypoglycemia, proper foot care and importance of medication, aerobic/resistive exercise and nutrition plan for blood glucose control.;Long Term: Attainment of HbA1C < 7%.    Heart Failure Yes    Intervention Provide a combined exercise and nutrition program that is supplemented with education, support and counseling about heart failure. Directed toward relieving symptoms such as shortness of breath, decreased exercise tolerance, and extremity edema.    Expected Outcomes Improve functional capacity of life;Short term: Attendance in program 2-3 days a week with increased exercise capacity. Reported lower sodium intake. Reported increased  fruit and vegetable intake. Reports medication compliance.;Short term: Daily weights obtained and reported for increase. Utilizing diuretic protocols set by physician.;Long term: Adoption of self-care skills and reduction of barriers for early signs and symptoms recognition and intervention leading to self-care maintenance.             Education:Diabetes - Individual verbal and written instruction to review signs/symptoms of diabetes, desired ranges of glucose level fasting, after meals and with exercise. Acknowledge that pre and post exercise glucose checks will be done for 3 sessions at entry of program. [place] from 03/23/2021 in [facility] Cardiac and Pulmonary Rehab  Date 01/13/21  Educator AS  Instruction Review Code 1- Verbalizes Understanding       Core Components/Risk Factors/Patient Goals Review:   Goals and Risk Factor Review     Row Name 02/16/21 1116 03/21/21 1114           Core Components/Risk Factors/Patient Goals Review   Personal Goals Review Diabetes;Improve shortness of breath with ADL's;Weight Management/Obesity Weight Management/Obesity;Diabetes      Review Darris's weight is steady.  He states he feels better and is getting energy back.   He checks BG at home a couple times a week. U[place]ano has lost 5 lb since stating the program.  He continues to check BG at home FBG has been 93-115.  he feels pretty good about overall stamina      Expected Outcomes Short: continue to check BG at home Long: manage risk factors long term Short: continue to monitor BG Long: keep risk factors at optimal levels               Core Components/Risk Factors/Patient Goals at Discharge (Final Review):   Goals and Risk Factor Review - 03/21/21 1114       Core Components/Risk Factors/Patient Goals Review   Personal Goals Review Weight Management/Obesity;Diabetes    Review Jamicheal has lost 5 lb since stating the program.  He continues to check BG at home FBG has been  93-115.  he feels pretty good about overall stamina    Expected Outcomes Short: continue to monitor BG Long: keep risk factors at optimal levels             ITP Comments:  ITP Comments     Row Name 12/08/20 1555  01/13/21 1605 01/24/21 1617 02/02/21 0616 02/07/21 1503   ITP Comments Initial telephone orientation completed. Diagnosis can be found in [facility] 6/21. EP orientation scheduled for Thursday 7/21 at 2pm. Completed 6MWT and gym orientation. Initial ITP created and sent for review to Medical Director. First full day of exercise!  Patient was oriented to gym and equipment including functions, settings, policies, and procedures.  Patient's individual exercise prescription and treatment plan were reviewed.  All starting workloads were established based on the results of the 6 minute walk test done at initial orientation visit.  The plan for exercise progression was also introduced and progression will be customized based on patient's performance and goals. 30 Day review completed. Medical Director ITP review done, changes made as directed, and signed approval by Medical Director. Finnian was told to stay home today as he had been having cold like symptoms.  He was also encouraged to get tested for COVID.    [place] Name 03/02/21 0858 03/21/21 1157 03/30/21 0659       ITP Comments 30 day review completed. ITP sent to Dr. Emily Filbert, Medical Director of Cardiac Rehab. Continue with ITP unless changes are made by physician. Completed initial RD consultation 30 Day review completed. Medical Director ITP review done, changes made as directed, and signed approval by Medical Director.              Comments:  30 Day review completed. Medical Director ITP review done, changes made as directed, and signed approval by Medical Director.

## 2021-04-01 ENCOUNTER — Encounter: Payer: Medicare HMO | Attending: Cardiology

## 2021-04-01 DIAGNOSIS — I5022 Chronic systolic (congestive) heart failure: Secondary | ICD-10-CM | POA: Insufficient documentation

## 2021-04-04 ENCOUNTER — Ambulatory Visit: Payer: Medicaid Other | Admitting: Family Medicine

## 2021-04-04 ENCOUNTER — Other Ambulatory Visit: Payer: Self-pay

## 2021-04-04 ENCOUNTER — Encounter: Payer: Medicare HMO | Admitting: *Deleted

## 2021-04-04 DIAGNOSIS — I5022 Chronic systolic (congestive) heart failure: Secondary | ICD-10-CM | POA: Diagnosis present

## 2021-04-04 NOTE — Progress Notes (Signed)
Daily Session Note  Patient Details  Name: Nicholas Horton MRN: [id number] Date of Birth: 11-14-1975 Referring Provider:   Flowsheet Row Cardiac Rehab from 01/13/2021 in [facility] Cardiac and Pulmonary Rehab  Referring Provider Newman Pies       Encounter Date: 04/04/2021  Check In:  Session Check In - 04/04/21 1123       Check-In   Supervising physician immediately available to respond to emergencies Virtual Visit, No Supervising Physician Required.    Location [facility]-Cardiac & Pulmonary Rehab    Staff Present Renita Papa, RN Moises Blood, BS, ACSM CEP, Exercise Physiologist;Amanda Oletta Darter, [place], ACSM CEP, Exercise Physiologist;Kelly Rosalia Hammers, MPA, RN;Joseph Tessie Fass, [place]    Virtual Visit No    Medication changes reported     No    Fall or balance concerns reported    No    Warm-up and Cool-down Performed on first and last piece of equipment    Resistance Training Performed Yes    VAD Patient? No    PAD/SET Patient? No      Pain Assessment   Currently in Pain? No/denies                Social History   Tobacco Use  Smoking Status Former   Packs/day: 2.00   Years: 20.00   Pack years: 40.00   Types: Cigarettes  Smokeless Tobacco Former  Tobacco Comments   quit 10/04/14    Goals Met:  Independence with exercise equipment Exercise tolerated well No report of concerns or symptoms today Strength training completed today  Goals Unmet:  Not Applicable  Comments: Pt able to follow exercise prescription today without complaint.  Will continue to monitor for progression.    Dr. Emily Filbert is Medical Director for [place].  Dr. Ottie Glazier is Medical Director for [facility] Pulmonary Rehabilitation.

## 2021-04-11 ENCOUNTER — Other Ambulatory Visit: Payer: Self-pay

## 2021-04-11 ENCOUNTER — Encounter: Payer: Medicare HMO | Admitting: *Deleted

## 2021-04-11 DIAGNOSIS — I5022 Chronic systolic (congestive) heart failure: Secondary | ICD-10-CM | POA: Diagnosis not present

## 2021-04-11 NOTE — Progress Notes (Signed)
Daily Session Note  Patient Details  Name: Nicholas Horton MRN: [id number] Date of Birth: 1976/04/04 Referring Provider:   Flowsheet Row Cardiac Rehab from 01/13/2021 in [facility] Cardiac and Pulmonary Rehab  Referring Provider Newman Pies       Encounter Date: 04/11/2021  Check In:  Session Check In - 04/11/21 1058       Check-In   Supervising physician immediately available to respond to emergencies See telemetry face sheet for immediately available ER MD    Location [facility]-Cardiac & Pulmonary Rehab    Staff Present Renita Papa, RN Moises Blood, BS, ACSM CEP, Exercise Physiologist;Amanda Oletta Darter, BA, ACSM CEP, Exercise Physiologist;Kelly Rosalia Hammers, MPA, RN    Virtual Visit No    Medication changes reported     No    Fall or balance concerns reported    No    Warm-up and Cool-down Performed on first and last piece of equipment    Resistance Training Performed Yes    VAD Patient? No    PAD/SET Patient? No      Pain Assessment   Currently in Pain? No/denies                Social History   Tobacco Use  Smoking Status Former   Packs/day: 2.00   Years: 20.00   Pack years: 40.00   Types: Cigarettes  Smokeless Tobacco Former  Tobacco Comments   quit 10/04/14    Goals Met:  Independence with exercise equipment Exercise tolerated well No report of concerns or symptoms today Strength training completed today  Goals Unmet:  Not Applicable  Comments: Pt able to follow exercise prescription today without complaint.  Will continue to monitor for progression.    Dr. Emily Filbert is Medical Director for [place].  Dr. Ottie Glazier is Medical Director for [facility] Pulmonary Rehabilitation.

## 2021-04-15 ENCOUNTER — Other Ambulatory Visit: Payer: Self-pay

## 2021-04-15 ENCOUNTER — Encounter: Payer: Medicare HMO | Admitting: *Deleted

## 2021-04-15 DIAGNOSIS — I5022 Chronic systolic (congestive) heart failure: Secondary | ICD-10-CM | POA: Diagnosis not present

## 2021-04-15 NOTE — Progress Notes (Signed)
Daily Session Note  Patient Details  Name: Nicholas Horton MRN: [id number] Date of Birth: [birth year] Referring Provider:   Flowsheet Row Cardiac Rehab from 01/13/2021 in [facility] Cardiac and Pulmonary Rehab  Referring Provider Newman Pies       Encounter Date: 04/15/2021  Check In:  Session Check In - 04/15/21 1057       Check-In   Supervising physician immediately available to respond to emergencies See telemetry face sheet for immediately available ER MD    Location [facility]-Cardiac & Pulmonary Rehab    Staff Present Renita Papa, RN BSN;Joseph [place], RCP,RRT,BSRT;Jessica [place], [place], RCEP, CCRP, CCET    Virtual Visit No    Medication changes reported     No    Fall or balance concerns reported    No    Warm-up and Cool-down Performed on first and last piece of equipment    Resistance Training Performed Yes    VAD Patient? No    PAD/SET Patient? No      Pain Assessment   Currently in Pain? No/denies                Social History   Tobacco Use  Smoking Status Former   Packs/day: 2.00   Years: 20.00   Pack years: 40.00   Types: Cigarettes  Smokeless Tobacco Former  Tobacco Comments   quit 10/04/14    Goals Met:  Independence with exercise equipment Exercise tolerated well No report of concerns or symptoms today Strength training completed today  Goals Unmet:  Not Applicable  Comments: Pt able to follow exercise prescription today without complaint.  Will continue to monitor for progression.    Dr. Emily Filbert is Medical Director for [place].  Dr. Ottie Glazier is Medical Director for [facility] Pulmonary Rehabilitation.

## 2021-04-19 ENCOUNTER — Other Ambulatory Visit: Payer: Self-pay

## 2021-04-19 DIAGNOSIS — I5022 Chronic systolic (congestive) heart failure: Secondary | ICD-10-CM | POA: Diagnosis not present

## 2021-04-19 NOTE — Progress Notes (Signed)
Daily Session Note  Patient Details  Name: Nicholas Horton MRN: [id number] Date of Birth: 1975/10/01 Referring Provider:   Flowsheet Row Cardiac Rehab from 01/13/2021 in [facility] Cardiac and Pulmonary Rehab  Referring Provider Newman Pies       Encounter Date: 04/19/2021  Check In:  Session Check In - 04/19/21 1117       Check-In   Supervising physician immediately available to respond to emergencies See telemetry face sheet for immediately available ER MD    Location [facility]-Cardiac & Pulmonary Rehab    Staff Present Birdie Sons, MPA, Elveria Rising, BA, ACSM CEP, Exercise Physiologist;Melissa Caiola, RDN, LDN    Virtual Visit No    Medication changes reported     No    Fall or balance concerns reported    No    Tobacco Cessation No Change    Warm-up and Cool-down Performed on first and last piece of equipment    Resistance Training Performed Yes    VAD Patient? No    PAD/SET Patient? No      Pain Assessment   Currently in Pain? No/denies                Social History   Tobacco Use  Smoking Status Former   Packs/day: 2.00   Years: 20.00   Pack years: 40.00   Types: Cigarettes  Smokeless Tobacco Former  Tobacco Comments   quit 10/04/14    Goals Met:  Independence with exercise equipment Exercise tolerated well No report of concerns or symptoms today Strength training completed today  Goals Unmet:  Not Applicable  Comments: Pt able to follow exercise prescription today without complaint.  Will continue to monitor for progression.    Dr. Emily Filbert is Medical Director for [place].  Dr. Ottie Glazier is Medical Director for [facility] Pulmonary Rehabilitation.

## 2021-04-27 ENCOUNTER — Encounter: Payer: Self-pay | Admitting: *Deleted

## 2021-04-27 DIAGNOSIS — I5022 Chronic systolic (congestive) heart failure: Secondary | ICD-10-CM

## 2021-04-27 NOTE — Progress Notes (Signed)
Cardiac Individual Treatment Plan  Patient Details  Name: Nicholas Horton MRN: [id number] Date of Birth: 10-Jul-1975 Referring Provider:   Flowsheet Row Cardiac Rehab from 01/13/2021 in [facility] Cardiac and Pulmonary Rehab  Referring Provider Newman Pies       Initial Encounter Date:  Flowsheet Row Cardiac Rehab from 01/13/2021 in [facility] Cardiac and Pulmonary Rehab  Date 01/13/21       Visit Diagnosis: Heart failure, chronic systolic ([place])  Patient's Home Medications on Admission:  Current Outpatient Medications:    albuterol (VENTOLIN HFA) 108 (90 Base) MCG/ACT inhaler, Inhale into the lungs., Disp: , Rfl:    ARIPiprazole (ABILIFY) 10 MG tablet, Take 1 tablet (10 mg total) by mouth daily., Disp: 30 tablet, Rfl: 1   artificial tears (LACRILUBE) OINT ophthalmic ointment, Place 1 application into both eyes every 8 (eight) hours. (Patient not taking: Reported on 12/08/2020), Disp: 1 Tube, Rfl: 0   aspirin 81 MG chewable tablet, Place 1 tablet (81 mg total) into feeding tube daily. (Patient not taking: Reported on 12/08/2020), Disp: 1 tablet, Rfl: 0   buPROPion (WELLBUTRIN SR) 150 MG 12 hr tablet, Take 3 tablets (450 mg total) by mouth daily., Disp: 90 tablet, Rfl: 1   dapagliflozin propanediol (FARXIGA) 10 MG TABS tablet, Take 1 tablet by mouth daily., Disp: , Rfl:    diltiazem (CARDIZEM CD) 120 MG 24 hr capsule, Take 1 capsule (120 mg total) by mouth daily. OFFICE VISIT NEEDED, Disp: 30 capsule, Rfl: 1   furosemide (LASIX) 20 MG tablet, Take 1 tablet (20 mg total) by mouth daily., Disp: 30 tablet, Rfl: 1   Glycopyrrolate-Formoterol (BEVESPI AEROSPHERE) 9-4.8 MCG/ACT AERO, INHALE 2 PUFFS INTO THE LUNGS TWICE A DAY, Disp: , Rfl:    pantoprazole sodium (PROTONIX) 40 mg/20 mL PACK, Place 20 mLs (40 mg total) into feeding tube daily. (Patient not taking: Reported on 12/08/2020), Disp: 30 each, Rfl: 0   polyethylene glycol (MIRALAX / GLYCOLAX) packet, Place 17 g into feeding tube daily. (Patient not taking:  Reported on 12/08/2020), Disp: 14 each, Rfl: 0   senna-docusate (SENOKOT-S) 8.6-50 MG tablet, Place 2 tablets into feeding tube 2 (two) times daily. (Patient not taking: Reported on 12/08/2020), Disp: 2 tablet, Rfl: 0   spironolactone (ALDACTONE) 25 MG tablet, Take 1 tablet (25 mg total) by mouth daily., Disp: 30 tablet, Rfl: 1   tadalafil (CIALIS) 20 MG tablet, Take by mouth., Disp: , Rfl:   Past Medical History: Past Medical History:  Diagnosis Date   CHF (congestive heart failure) ([place])    Congenital bicuspid aortic valve 09/2014   - s/p AVR   Nonischemic cardiomyopathy ([facility]) -- Resolved[I42.9] 09/2014   EF by Echo 20-25% (pre-op AVR) --> Echo 10/2014 & 03/2015: EF 50-55% - also Recent Normal Diastolic parameters    S/P AVR (aortic valve replacement) 10/2014   21 mm Carbometrics Mechanical Valve -- Epicardial PPM Leads   Severe aortic stenosis 10/04/2014   Presented with Syncope & CHF    Tobacco Use: Social History   Tobacco Use  Smoking Status Former   Packs/day: 2.00   Years: 20.00   Pack years: 40.00   Types: Cigarettes  Smokeless Tobacco Former  Tobacco Comments   quit 10/04/14    Labs: Recent Review Flowsheet Data     Labs for ITP Cardiac and Pulmonary Rehab Latest Ref Rng & Units 03/24/2018 03/24/2018 03/24/2018 03/24/2018 01/17/2021   Cholestrol 0 - 200 mg/dL - - - - 157   LDLCALC 0 - 99 mg/dL - - - -  60   HDL >40 mg/dL - - - - 35(L)   Trlycerides <150 mg/dL - - - - 312(H)   Hemoglobin A1c <5.7 % of total Hgb - - - - -   PHART 7.350 - 7.450 7.46(H) 7.31(L) 7.33(L) 7.49(H) -   PCO2ART 32.0 - 48.0 mmHg 60(H) 88(HH) 79(HH) 47 -   HCO3 20.0 - 28.0 mmol/L 42.7(H) 44.3(H) 41.7(H) 35.8(H) -   TCO2 0 - 100 mmol/L - - - - -   ACIDBASEDEF 0.0 - 2.0 mmol/L - - - - -   O2SAT % 93.5 85.1 99.2 98.8 -        Exercise Target Goals: Exercise Program Goal: Individual exercise prescription set using results from initial 6 min walk test and THRR while considering  patient's activity  barriers and safety.   Exercise Prescription Goal: Initial exercise prescription builds to 30-45 minutes a day of aerobic activity, 2-3 days per week.  Home exercise guidelines will be given to patient during program as part of exercise prescription that the participant will acknowledge.   Education: Aerobic Exercise: - Group verbal and visual presentation on the components of exercise prescription. Introduces F.I.T.T principle from ACSM for exercise prescriptions.  Reviews F.I.T.T. principles of aerobic exercise including progression. Written material given at graduation. Flowsheet Row Cardiac Rehab from 03/23/2021 in [facility] Cardiac and Pulmonary Rehab  Date 03/23/21  Educator [facility]  Instruction Review Code 1- Verbalizes Understanding       Education: Resistance Exercise: - Group verbal and visual presentation on the components of exercise prescription. Introduces F.I.T.T principle from ACSM for exercise prescriptions  Reviews F.I.T.T. principles of resistance exercise including progression. Written material given at graduation. Flowsheet Row Cardiac Rehab from 03/23/2021 in [facility] Cardiac and Pulmonary Rehab  Date 01/26/21  Educator [facility]  Instruction Review Code 1- Verbalizes Understanding        Education: Exercise & Equipment Safety: - Individual verbal instruction and demonstration of equipment use and safety with use of the equipment. Flowsheet Row Cardiac Rehab from 03/23/2021 in [facility] Cardiac and Pulmonary Rehab  Date 01/13/21  Educator [facility]  Instruction Review Code 1- Verbalizes Understanding       Education: Exercise Physiology & General Exercise Guidelines: - Group verbal and written instruction with models to review the exercise physiology of the cardiovascular system and associated critical values. Provides general exercise guidelines with specific guidelines to those with heart or lung disease.    Education: Flexibility, Balance, Mind/Body Relaxation: - Group verbal and  visual presentation with interactive activity on the components of exercise prescription. Introduces F.I.T.T principle from ACSM for exercise prescriptions. Reviews F.I.T.T. principles of flexibility and balance exercise training including progression. Also discusses the mind body connection.  Reviews various relaxation techniques to help reduce and manage stress (i.e. Deep breathing, progressive muscle relaxation, and visualization). Balance handout provided to take home. Written material given at graduation.   Activity Barriers & Risk Stratification:  Activity Barriers & Cardiac Risk Stratification - 12/08/20 1533       Activity Barriers & Cardiac Risk Stratification   Activity Barriers None    Cardiac Risk Stratification High             6 Minute Walk:  6 Minute Walk     Row Name 01/13/21 1544         6 Minute Walk   Phase Initial     Distance 1180 feet     Walk Time 6 minutes     # of Rest Breaks 0  MPH 2.2     METS 3.68     RPE 9     Perceived Dyspnea  1     VO2 Peak 12.9     Symptoms No     Resting HR 77 bpm     Resting BP 102/58     Resting Oxygen Saturation  94 %     Exercise Oxygen Saturation  during 6 min walk 94 %     Max Ex. HR 88 bpm     Max Ex. BP 118/72     2 Minute Post BP 102/66              Oxygen Initial Assessment:   Oxygen Re-Evaluation:   Oxygen Discharge (Final Oxygen Re-Evaluation):   Initial Exercise Prescription:  Initial Exercise Prescription - 01/13/21 1500       Date of Initial Exercise RX and Referring Provider   Date 01/13/21    Referring Provider Newman Pies      Treadmill   MPH 2.2    Grade 1.5    Minutes 15    METs 3.2      REL-XR   Level 3    Speed 50    Minutes 15    METs 3.2      T5 Nustep   Level 2    SPM 80    Minutes 15    METs 3.2      Prescription Details   Frequency (times per week) 3    Duration Progress to 30 minutes of continuous aerobic without signs/symptoms of physical distress       Intensity   THRR 40-80% of Max Heartrate 116-156    Ratings of Perceived Exertion 11-13    Perceived Dyspnea 0-4      Resistance Training   Training Prescription Yes    Weight 3 lb    Reps 10-15             Perform Capillary Blood Glucose checks as needed.  Exercise Prescription Changes:   Exercise Prescription Changes     Row Name 01/13/21 1500 02/07/21 1500 02/21/21 0800 03/04/21 1100 03/07/21 1000     Response to Exercise   Blood Pressure (Admit) 102/58 110/60 150/72 -- 110/68   Blood Pressure (Exercise) 118/72 136/64 -- -- --   Blood Pressure (Exit) 102/66 104/60 118/68 -- 124/72   Heart Rate (Admit) 77 bpm 58 bpm 66 bpm -- 57 bpm   Heart Rate (Exercise) 88 bpm 103 bpm 114 bpm -- 101 bpm   Heart Rate (Exit) 83 bpm 72 bpm 83 bpm -- 70 bpm   Oxygen Saturation (Admit) 94 % -- -- -- --   Oxygen Saturation (Exercise) 94 % -- -- -- --   Rating of Perceived Exertion (Exercise) _0 -- 14   Perceived Dyspnea (Exercise) 1 -- -- -- --   Symptoms none none none -- none   Duration -- Progress to 30 minutes of  aerobic without signs/symptoms of physical distress Progress to 30 minutes of  aerobic without signs/symptoms of physical distress -- Continue with 30 min of aerobic exercise without signs/symptoms of physical distress.   Intensity -- THRR unchanged THRR unchanged -- THRR unchanged     Progression   Progression -- Continue to progress workloads to maintain intensity without signs/symptoms of physical distress. Continue to progress workloads to maintain intensity without signs/symptoms of physical distress. -- Continue to progress workloads to maintain intensity without signs/symptoms of physical distress.   Average METs -- 2.88  3.41 -- 5.08     Resistance Training   Training Prescription -- Yes Yes -- Yes   Weight -- 3 lb 3 lb -- 6 lb   Reps -- 10-15 10-15 -- 10-15     Interval Training   Interval Training -- No No -- No     Treadmill   MPH -- 2.2 3 -- 3   Grade  -- 1.5 2 -- 10   Minutes -- 15 15 -- 15   METs -- 3.14 4.12 -- 7.4     Recumbant Bike   Level -- -- -- -- 9   Watts -- -- -- -- 60   Minutes -- -- -- -- 15   METs -- -- -- -- 2.73     NuStep   Level -- 4 -- -- --   Minutes -- 15 -- -- --   METs -- 3.2 -- -- --     REL-XR   Level -- 3 3.5 -- --   Minutes -- 15 15 -- --   METs -- 2.3 2.7 -- --     Home Exercise Plan   Plans to continue exercise at -- -- -- Home (comment)  walk Home (comment)  walk   Frequency -- -- -- Add 2 additional days to program exercise sessions. Add 2 additional days to program exercise sessions.   Initial Home Exercises Provided -- -- -- 03/04/21 03/04/21    Row Name 03/21/21 0800 04/04/21 1200 04/18/21 0800         Response to Exercise   Blood Pressure (Admit) 118/70 102/60 118/68     Blood Pressure (Exit) 110/62 108/66 112/64     Heart Rate (Admit) 70 bpm 58 bpm 58 bpm     Heart Rate (Exercise) 110 bpm 107 bpm 114 bpm     Heart Rate (Exit) 79 bpm 69 bpm 8 bpm     Rating of Perceived Exertion (Exercise) _0 Symptoms none none none     Duration Continue with 30 min of aerobic exercise without signs/symptoms of physical distress. Continue with 30 min of aerobic exercise without signs/symptoms of physical distress. Continue with 30 min of aerobic exercise without signs/symptoms of physical distress.     Intensity THRR unchanged THRR unchanged THRR unchanged       Progression   Progression Continue to progress workloads to maintain intensity without signs/symptoms of physical distress. Continue to progress workloads to maintain intensity without signs/symptoms of physical distress. Continue to progress workloads to maintain intensity without signs/symptoms of physical distress.     Average METs 5.9 5.56 5.3       Resistance Training   Training Prescription Yes Yes Yes     Weight 6 lb 6 lb 6 lb     Reps 10-15 10-15 10-15       Interval Training   Interval Training No No No        Treadmill   MPH _1 Grade _2 Minutes _3 METs 7.4 7.4 7.4       Recumbant Bike   Level -- 8 9     Watts -- 62 51     Minutes -- 15 15     METs -- 3.5 3.5       REL-XR   Level 10 10 --     Minutes 15 15 --  METs -- 5.7 --       Home Exercise Plan   Plans to continue exercise at Home (comment)  walk Home (comment)  walk Home (comment)  walk     Frequency Add 2 additional days to program exercise sessions. Add 2 additional days to program exercise sessions. Add 2 additional days to program exercise sessions.     Initial Home Exercises Provided 03/04/21 03/04/21 03/04/21              Exercise Comments:   Exercise Comments     Row Name 01/24/21 1617           Exercise Comments First full day of exercise!  Patient was oriented to gym and equipment including functions, settings, policies, and procedures.  Patient's individual exercise prescription and treatment plan were reviewed.  All starting workloads were established based on the results of the 6 minute walk test done at initial orientation visit.  The plan for exercise progression was also introduced and progression will be customized based on patient's performance and goals.                Exercise Goals and Review:   Exercise Goals     Row Name 01/13/21 1551             Exercise Goals   Increase Physical Activity Yes       Intervention Provide advice, education, support and counseling about physical activity/exercise needs.;Develop an individualized exercise prescription for aerobic and resistive training based on initial evaluation findings, risk stratification, comorbidities and participant's personal goals.       Expected Outcomes Short Term: Attend rehab on a regular basis to increase amount of physical activity.;Long Term: Add in home exercise to make exercise part of routine and to increase amount of physical activity.;Long Term: Exercising regularly at least 3-5 days a week.        Increase Strength and Stamina Yes       Intervention Provide advice, education, support and counseling about physical activity/exercise needs.;Develop an individualized exercise prescription for aerobic and resistive training based on initial evaluation findings, risk stratification, comorbidities and participant's personal goals.       Expected Outcomes Short Term: Increase workloads from initial exercise prescription for resistance, speed, and METs.;Short Term: Perform resistance training exercises routinely during rehab and add in resistance training at home;Long Term: Improve cardiorespiratory fitness, muscular endurance and strength as measured by increased METs and functional capacity (6MWT)       Able to understand and use rate of perceived exertion (RPE) scale Yes       Intervention Provide education and explanation on how to use RPE scale       Expected Outcomes Short Term: Able to use RPE daily in rehab to express subjective intensity level;Long Term:  Able to use RPE to guide intensity level when exercising independently       Able to understand and use Dyspnea scale Yes       Intervention Provide education and explanation on how to use Dyspnea scale       Expected Outcomes Short Term: Able to use Dyspnea scale daily in rehab to express subjective sense of shortness of breath during exertion;Long Term: Able to use Dyspnea scale to guide intensity level when exercising independently       Knowledge and understanding of Target Heart Rate Range (THRR) Yes       Intervention Provide education and explanation of THRR including how the numbers were predicted and  where they are located for reference       Expected Outcomes Short Term: Able to state/look up THRR;Short Term: Able to use daily as guideline for intensity in rehab;Long Term: Able to use THRR to govern intensity when exercising independently       Able to check pulse independently Yes       Intervention Provide education and  demonstration on how to check pulse in carotid and radial arteries.;Review the importance of being able to check your own pulse for safety during independent exercise       Expected Outcomes Short Term: Able to explain why pulse checking is important during independent exercise;Long Term: Able to check pulse independently and accurately       Understanding of Exercise Prescription Yes                Exercise Goals Re-Evaluation :  Exercise Goals Re-Evaluation     Row Name 01/24/21 1617 02/07/21 1505 02/21/21 0832 03/04/21 1130 03/07/21 1021     Exercise Goal Re-Evaluation   Exercise Goals Review Increase Physical Activity;Able to understand and use rate of perceived exertion (RPE) scale;Knowledge and understanding of Target Heart Rate Range (THRR);Understanding of Exercise Prescription;Increase Strength and Stamina;Able to understand and use Dyspnea scale;Able to check pulse independently Increase Physical Activity;Increase Strength and Stamina;Understanding of Exercise Prescription Increase Physical Activity;Increase Strength and Stamina Increase Physical Activity;Able to understand and use rate of perceived exertion (RPE) scale;Knowledge and understanding of Target Heart Rate Range (THRR);Understanding of Exercise Prescription;Increase Strength and Stamina;Able to check pulse independently Increase Physical Activity;Increase Strength and Stamina   Comments Reviewed RPE and dyspnea scales, THR and program prescription with pt today.  Pt voiced understanding and was given a copy of goals to take home. Myles is off to a good start in rehab.  He is now on level 4 on the NuStep.  We will continue to monitor his progress. Akili is doing well and has increased speed and grade on TM.  Staff will encourage increasing weights for strength work Reviewed home exercise with pt today.  Pt plans to walk for exercise.  Reviewed THR, pulse, RPE, sign and symptoms, pulse oximetery and when to call 911 or MD.   Also discussed weather considerations and indoor options.  Pt voiced understanding. Emigdio is doing well in rehab. He has built up to 6 lbs for handweights and level 9 on the recumbant bike and reached 60 watts. Treadmill incline to up to 10%. Will continue to monitor, along with consistent attendance.   Expected Outcomes Short: Use RPE daily to regulate intensity. Long: Follow program prescription in THR. Short: Increase treadmill. Long: Continue to improve stamina Short: increase weights Long:improve average MET level Short: add 2 extra days of exercise. Long: independently exercise after graduation Short: Continue to increase handweights Long: Continue to increase overall MET level    Row Name 03/21/21 0844 04/04/21 1213 04/11/21 1113 04/18/21 0855       Exercise Goal Re-Evaluation   Exercise Goals Review Increase Physical Activity;Increase Strength and Stamina Increase Physical Activity;Increase Strength and Stamina;Understanding of Exercise Prescription Increase Physical Activity;Increase Strength and Stamina Increase Physical Activity;Increase Strength and Stamina    Comments Lex continues to do well whe he attends.  He was only here once per week the past 2 weeks.  Staff wil review importance of being consistent. Ollis is doing well in rehab.  He is enjoying doing intervals and  up to 5.7 METs on the XR. We will continue to monitor  his progress. Alto is walking 2-3 days when not in HT.  He does not check HR while walking.  He does feel his RPE is abot the same as TM.  He does know how to check HR and staf reviewed importance of making sure he works in [employer] range. Stacey continues to do well in HT and is on level 9 on the bike.  He is currently using 6 lb for strength work.    Expected Outcomes Short: attend 2-3 times per week Long:improve overall stamina Short: Continue to add intervals Long: Continue to improve stamina. Short: check HR while walking Long: continue to build stamina Short:  try 7 lb Long:  continue to improve stamina             Discharge Exercise Prescription (Final Exercise Prescription Changes):  Exercise Prescription Changes - 04/18/21 0800       Response to Exercise   Blood Pressure (Admit) 118/68    Blood Pressure (Exit) 112/64    Heart Rate (Admit) 58 bpm    Heart Rate (Exercise) 114 bpm    Heart Rate (Exit) 8 bpm    Rating of Perceived Exertion (Exercise) 13    Symptoms none    Duration Continue with 30 min of aerobic exercise without signs/symptoms of physical distress.    Intensity THRR unchanged      Progression   Progression Continue to progress workloads to maintain intensity without signs/symptoms of physical distress.    Average METs 5.3      Resistance Training   Training Prescription Yes    Weight 6 lb    Reps 10-15      Interval Training   Interval Training No      Treadmill   MPH 3    Grade 10    Minutes 15    METs 7.4      Recumbant Bike   Level 9    Watts 51    Minutes 15    METs 3.5      Home Exercise Plan   Plans to continue exercise at Home (comment)   walk   Frequency Add 2 additional days to program exercise sessions.    Initial Home Exercises Provided 03/04/21             Nutrition:  Target Goals: Understanding of nutrition guidelines, daily intake of sodium <1551m, cholesterol <2031m calories 30% from fat and 7% or less from saturated fats, daily to have 5 or more servings of fruits and vegetables.  Education: All About Nutrition: -Group instruction provided by verbal, written material, interactive activities, discussions, models, and posters to present general guidelines for heart healthy nutrition including fat, fiber, MyPlate, the role of sodium in heart healthy nutrition, utilization of the nutrition label, and utilization of this knowledge for meal planning. Follow up email sent as well. Written material given at graduation. [facility]  Date 02/16/21  [facility] Code 1- Verbalizes Understanding       Biometrics:  Pre Biometrics - 01/13/21 1551       Pre Biometrics   Height 6' 0.5" (1.842 m)    Weight 239 lb 3.2 oz (108.5 kg)    BMI (Calculated) 31.98    Single Leg Stand 30 seconds              Nutrition Therapy Plan and Nutrition Goals:  Nutrition Therapy & Goals - 03/21/21 1030  Nutrition Therapy   Diet Heart healthy, low Na, diabetes frienldy    Drug/Food Interactions Coumadin/Vit K   pt reports taking coumadin   Protein (specify units) 80g    Fiber 30 grams    Whole Grain Foods 3 servings    Saturated Fats 12 max. grams    Fruits and Vegetables 8 servings/day    Sodium 1.5 grams      Personal Nutrition Goals   Nutrition Goal ST: 2-4 CHO per meal (75g CHo is 5 servings - from recall ot seems he would like to keep it on the lower end) LT: improve energy, at least 1 whole grain per day, 8 fruits/vegetables per day, at least 2 CHO servings per meal    Comments He is on a low CHO diet - he defines as <75g/meal, from recall ot seems he would like to keep it on the lower end: He eats a lot of salads and grilled chicken - he has fried chicken sometimes. He will eat hamburgers and all beef hotdogs - rarely has them. He has red meat 3x/week. B: 8 am: cereal (captain crunch with lactose free whole milk) L: [place] sandwich with chips (Arnold keto bread - 8g fiber) S: sugar free jello or atkins protein bar with tea with splenda. D: varies: grilled chicken or friend chicken, hamburger or hamburger steak. He has his protein on the keto bread. 2-3 vegetables: peas, carrots, he doesn't like corn, salads, riced caulifower, small amount of leafy greens with coumadin. He will sometimes use butter or olive oil. He does not salt his food. Discussed heart healthy eating and diabetes friendly eaitng.      Intervention Plan   Intervention Nutrition handout(s) given to  patient.;Prescribe, educate and counsel regarding individualized specific dietary modifications aiming towards targeted core components such as weight, hypertension, lipid management, diabetes, heart failure and other comorbidities.    Expected Outcomes Short Term Goal: Understand basic principles of dietary content, such as calories, fat, sodium, cholesterol and nutrients.;Short Term Goal: A plan has been developed with personal nutrition goals set during dietitian appointment.;Long Term Goal: Adherence to prescribed nutrition plan.             Nutrition Assessments:  MEDIFICTS Score Key: ?70 Need to make dietary changes  40-70 Heart Healthy Diet ? 40 Therapeutic Level Cholesterol Diet  Flowsheet Row Cardiac Rehab from 01/13/2021 in [facility] Cardiac and Pulmonary Rehab  Picture Your Plate Total Score on Admission 62      Picture Your Plate Scores: <91 Unhealthy dietary pattern with much room for improvement. 41-50 Dietary pattern unlikely to meet recommendations for good health and room for improvement. 51-60 More healthful dietary pattern, with some room for improvement.  >60 Healthy dietary pattern, although there may be some specific behaviors that could be improved.    Nutrition Goals Re-Evaluation:  Nutrition Goals Re-Evaluation     [place] Name 02/16/21 1125 04/11/21 1116           Goals   Nutrition Goal -- Maricus has been eating smaller portions since meeting with RD.  He does feel satisfied and not hungry betwen meals.  He is working on having the recommended amoutn of carbs.      Comment Deano has not met with RD yet. --      Expected Outcome -- Short:  continue to follow RD advice Long: maintain heart healthy diet               Nutrition Goals Discharge (Final Nutrition Goals  Re-Evaluation):  Nutrition Goals Re-Evaluation - 04/11/21 1116       Goals   Nutrition Goal Fin has been eating smaller portions since meeting with RD.  He does feel satisfied and not  hungry betwen meals.  He is working on having the recommended amoutn of carbs.    Expected Outcome Short:  continue to follow RD advice Long: maintain heart healthy diet             Psychosocial: Target Goals: Acknowledge presence or absence of significant depression and/or stress, maximize coping skills, provide positive support system. Participant is able to verbalize types and ability to use techniques and skills needed for reducing stress and depression.   Education: Stress, Anxiety, and Depression - Group verbal and visual presentation to define topics covered.  Reviews how body is impacted by stress, anxiety, and depression.  Also discusses healthy ways to reduce stress and to treat/manage anxiety and depression.  Written material given at graduation. Flowsheet Row Cardiac Rehab from 03/23/2021 in [facility] Cardiac and Pulmonary Rehab  Date 03/09/21  Educator [facility]  Instruction Review Code 1- Verbalizes Understanding       Education: Sleep Hygiene -Provides group verbal and written instruction about how sleep can affect your health.  Define sleep hygiene, discuss sleep cycles and impact of sleep habits. Review good sleep hygiene tips.    Initial Review & Psychosocial Screening:  Initial Psych Review & Screening - 12/08/20 1534       Initial Review   Current issues with Current Sleep Concerns;History of Depression;Current Depression;Current Stress Concerns;Current Psychotropic Meds    Source of Stress Concerns Unable to participate in former interests or hobbies;Unable to perform yard/household activities      [place]? Yes   fiance     Barriers   Psychosocial barriers to participate in program There are no identifiable barriers or psychosocial needs.      Screening Interventions   Interventions Encouraged to exercise;Provide feedback about the scores to participant;To provide support and resources with identified psychosocial needs    Expected  Outcomes Short Term goal: Utilizing psychosocial counselor, staff and physician to assist with identification of specific Stressors or current issues interfering with healing process. Setting desired goal for each stressor or current issue identified.;Long Term Goal: Stressors or current issues are controlled or eliminated.;Short Term goal: Identification and review with participant of any Quality of Life or Depression concerns found by scoring the questionnaire.;Long Term goal: The participant improves quality of Life and PHQ9 Scores as seen by post scores and/or verbalization of changes             Quality of Life Scores:   Quality of Life - 01/13/21 1554       Quality of Life   Select Quality of Life      Quality of Life Scores   Health/Function Pre 9.63 %    Socioeconomic Pre 17.43 %    Psych/Spiritual Pre 9.57 %    Family Pre 15.6 %    GLOBAL Pre 12.1 %            Scores of 19 and below usually indicate a poorer quality of life in these areas.  A difference of  2-3 points is a clinically meaningful difference.  A difference of 2-3 points in the total score of the Quality of Life Index has been associated with significant improvement in overall quality of life, self-image, physical symptoms, and general health in studies assessing change in  quality of life.  PHQ-9: Recent Review Flowsheet Data     Depression screen [facility] 2/9 02/16/2021 01/13/2021 02/18/2018 12/19/2017   Decreased Interest _0 Down, Depressed, Hopeless _1 PHQ - 2 Score _2 Altered sleeping _3 Tired, decreased energy _4 Change in appetite _5 Feeling bad or failure about yourself  _6 Trouble concentrating 1 0 2 3   Moving slowly or fidgety/restless _7 Suicidal thoughts 0 3  0 1   PHQ-9 Score _8 Difficult doing work/chores Somewhat difficult Somewhat difficult Extremely dIfficult Somewhat difficult      Interpretation of Total Score  Total  Score Depression Severity:  1-4 = Minimal depression, 5-9 = Mild depression, 10-14 = Moderate depression, 15-19 = Moderately severe depression, 20-27 = Severe depression   Psychosocial Evaluation and Intervention:  Psychosocial Evaluation - 12/08/20 1549       Psychosocial Evaluation & Interventions   Interventions Encouraged to exercise with the program and follow exercise prescription;Stress management education;Relaxation education    Comments Ashtyn is coming to cardiac rehab for Systolic HF. He has had issues with his heart since 2016 when he got an AVR. Since then, he has gotten a pacemaker, developed COPD, is being worked up for a possible ICD, and is also a diabetic. He is on treatment for his depression and reports it works "most of the time" and his fiance helps keep his mood more level by being attentive to his mood and talking things through. His biggest goal during this program is to lose weight as he feels this will really help his heart. His main motivation for maintaining a heart healthy lifestyle is his 58 year old daughter. He states his sleep is very irregular, most nights going to bed very late and sleeping late. He tries to do things around the house even though he doesn't want to and he notices his shortness of breath which leads to chest discomfort happens during a lot of physical activity. He has been trying to walk more but the summer heat has made that difficult.    Expected Outcomes Short: attend cardiac rehab for education and exercise. Long: develop and maintain positive self care habits.    Continue Psychosocial Services  Follow up required by staff             Psychosocial Re-Evaluation:  Psychosocial Re-Evaluation     [place] Name 02/16/21 1119 03/21/21 1117 04/11/21 1118         Psychosocial Re-Evaluation   Current issues with Current Stress Concerns;Current Psychotropic Meds;History of Depression;Current Sleep Concerns Current Stress Concerns;History of  Depression;Current Sleep Concerns Current Stress Concerns;History of Depression;Current Sleep Concerns     Comments Reviewed PHQ-9 with patient and score has improved.  He states he feels better overall.  He has trouble with sleep.  We reveiwed not having caffeine on the afternoon, as well not not eating too late, not using phone or computer right before bed.  He reports taking meds as directed. Cono takes Wellbutrin for depression.  He reports sleeping a little better.  He can nap if he has a night he doesnt sleep well.  He is not seeing a counselor currently but pplans to check into it. Anmol states his mind is clear.  He still is  slightly depressed.  He is looking into other activties to help him get out of the house to help with mood.  He has a group he can go with.  No changes in sleep patterns     Expected Outcomes Short: work on better sleep habits Long:  be able to sleep better consistently, exercise to help with symptoms of depression and anxiety Short: continue to work on sleep patterns and exercise to help with stress/depression Long: manage stress/depression with help of meds and exercise Short: try going out with friends Long:  manage stress/depression with exercise and medication              Psychosocial Discharge (Final Psychosocial Re-Evaluation):  Psychosocial Re-Evaluation - 04/11/21 1118       Psychosocial Re-Evaluation   Current issues with Current Stress Concerns;History of Depression;Current Sleep Concerns    Comments Macky states his mind is clear.  He still is slightly depressed.  He is looking into other activties to help him get out of the house to help with mood.  He has a group he can go with.  No changes in sleep patterns    Expected Outcomes Short: try going out with friends Long:  manage stress/depression with exercise and medication             Vocational Rehabilitation: Provide vocational rehab assistance to qualifying candidates.   Vocational  Rehab Evaluation & Intervention:  Vocational Rehab - 12/08/20 1543       Initial Vocational Rehab Evaluation & Intervention   Assessment shows need for Vocational Rehabilitation No             Education: Education Goals: Education classes will be provided on a variety of topics geared toward better understanding of heart health and risk factor modification. Participant will state understanding/return demonstration of topics presented as noted by education test scores.  Learning Barriers/Preferences:  Learning Barriers/Preferences - 12/08/20 1542       Learning Barriers/Preferences   Learning Barriers None    Learning Preferences None             General Cardiac Education Topics:  AED/CPR: - Group verbal and written instruction with the use of models to demonstrate the basic use of the AED with the basic ABC's of resuscitation.   Anatomy and Cardiac Procedures: - Group verbal and visual presentation and models provide information about basic cardiac anatomy and function. Reviews the testing methods done to diagnose heart disease and the outcomes of the test results. Describes the treatment choices: Medical Management, Angioplasty, or Coronary Bypass Surgery for treating various heart conditions including Myocardial Infarction, Angina, Valve Disease, and Cardiac Arrhythmias.  Written material given at graduation. Flowsheet Row Cardiac Rehab from 03/23/2021 in [facility] Cardiac and Pulmonary Rehab  Date 01/26/21  Educator [facility]  Instruction Review Code 1- Verbalizes Understanding       Medication Safety: - Group verbal and visual instruction to review commonly prescribed medications for heart and lung disease. Reviews the medication, class of the drug, and side effects. Includes the steps to properly store meds and maintain the prescription regimen.  Written material given at graduation.   Intimacy: - Group verbal instruction through game format to discuss how heart and lung  disease can affect sexual intimacy. Written material given at graduation.. Flowsheet Row Cardiac Rehab from 03/23/2021 in [facility] Cardiac and Pulmonary Rehab  Date 03/23/21  Educator [facility]  Instruction Review Code 1- Verbalizes Understanding       Know Your Numbers and Heart  Failure: - Group verbal and visual instruction to discuss disease risk factors for cardiac and pulmonary disease and treatment options.  Reviews associated critical values for Overweight/Obesity, Hypertension, Cholesterol, and Diabetes.  Discusses basics of heart failure: signs/symptoms and treatments.  Introduces Heart Failure Zone chart for action plan for heart failure.  Written material given at graduation.   Infection Prevention: - Provides verbal and written material to individual with discussion of infection control including proper hand washing and proper equipment cleaning during exercise session. Flowsheet Row Cardiac Rehab from 03/23/2021 in [facility] Cardiac and Pulmonary Rehab  Date 01/13/21  Educator AS  Instruction Review Code 1- Verbalizes Understanding       Falls Prevention: - Provides verbal and written material to individual with discussion of falls prevention and safety. Flowsheet Row Cardiac Rehab from 03/23/2021 in [facility] Cardiac and Pulmonary Rehab  Date 01/13/21  Educator AS  Instruction Review Code 1- Verbalizes Understanding       Other: -Provides group and verbal instruction on various topics (see comments)   Knowledge Questionnaire Score:   Core Components/Risk Factors/Patient Goals at Admission:  Personal Goals and Risk Factors at Admission - 01/13/21 1559       Core Components/Risk Factors/Patient Goals on Admission    Weight Management Yes;Weight Loss    Intervention Weight Management: Develop a combined nutrition and exercise program designed to reach desired caloric intake, while maintaining appropriate intake of nutrient and fiber, sodium and fats, and appropriate energy  expenditure required for the weight goal.;Weight Management: Provide education and appropriate resources to help participant work on and attain dietary goals.;Weight Management/Obesity: Establish reasonable short term and long term weight goals.    Admit Weight 239 lb 3.2 oz (108.5 kg)    Goal Weight: Short Term 225 lb (102.1 kg)    Goal Weight: Long Term 200 lb (90.7 kg)    Expected Outcomes Short Term: Continue to assess and modify interventions until short term weight is achieved;Long Term: Adherence to nutrition and physical activity/exercise program aimed toward attainment of established weight goal;Weight Loss: Understanding of general recommendations for a balanced deficit meal plan, which promotes 1-2 lb weight loss per week and includes a negative energy balance of [phone] kcal/d;Understanding of distribution of calorie intake throughout the day with the consumption of 4-5 meals/snacks;Understanding recommendations for meals to include 15-35% energy as protein, 25-35% energy from fat, 35-60% energy from carbohydrates, less than 214m of dietary cholesterol, 20-35 gm of total fiber daily    Improve shortness of breath with ADL's Yes    Intervention Provide education, individualized exercise plan and daily activity instruction to help decrease symptoms of SOB with activities of daily living.    Expected Outcomes Short Term: Improve cardiorespiratory fitness to achieve a reduction of symptoms when performing ADLs;Long Term: Be able to perform more ADLs without symptoms or delay the onset of symptoms    Diabetes Yes    Intervention Provide education about signs/symptoms and action to take for hypo/hyperglycemia.;Provide education about proper nutrition, including hydration, and aerobic/resistive exercise prescription along with prescribed medications to achieve blood glucose in normal ranges: Fasting glucose 65-99 mg/dL    Expected Outcomes Short Term: Participant verbalizes understanding of the  signs/symptoms and immediate care of hyper/hypoglycemia, proper foot care and importance of medication, aerobic/resistive exercise and nutrition plan for blood glucose control.;Long Term: Attainment of HbA1C < 7%.    Heart Failure Yes    Intervention Provide a combined exercise and nutrition program that is supplemented with education, support and counseling  about heart failure. Directed toward relieving symptoms such as shortness of breath, decreased exercise tolerance, and extremity edema.    Expected Outcomes Improve functional capacity of life;Short term: Attendance in program 2-3 days a week with increased exercise capacity. Reported lower sodium intake. Reported increased fruit and vegetable intake. Reports medication compliance.;Short term: Daily weights obtained and reported for increase. Utilizing diuretic protocols set by physician.;Long term: Adoption of self-care skills and reduction of barriers for early signs and symptoms recognition and intervention leading to self-care maintenance.             Education:Diabetes - Individual verbal and written instruction to review signs/symptoms of diabetes, desired ranges of glucose level fasting, after meals and with exercise. Acknowledge that pre and post exercise glucose checks will be done for 3 sessions at entry of program. [place] from 03/23/2021 in [facility] Cardiac and Pulmonary Rehab  Date 01/13/21  Educator AS  Instruction Review Code 1- Verbalizes Understanding       Core Components/Risk Factors/Patient Goals Review:   Goals and Risk Factor Review     Row Name 02/16/21 1116 03/21/21 1114 04/11/21 1112         Core Components/Risk Factors/Patient Goals Review   Personal Goals Review Diabetes;Improve shortness of breath with ADL's;Weight Management/Obesity Weight Management/Obesity;Diabetes Weight Management/Obesity;Diabetes     Review Corderius's weight is steady.  He states he feels better and is getting energy  back.   He checks BG at home a couple times a week. Travus has lost 5 lb since stating the program.  He continues to check BG at home FBG has been 93-115.  he feels pretty good about overall stamina Kerem's weight is about the same.  He can tell his clothes fit better.  He continues to check BG at home.     Expected Outcomes Short: continue to check BG at home Long: manage risk factors long term Short: continue to monitor BG Long: keep risk factors at optimal levels --              Core Components/Risk Factors/Patient Goals at Discharge (Final Review):   Goals and Risk Factor Review - 04/11/21 1112       Core Components/Risk Factors/Patient Goals Review   Personal Goals Review Weight Management/Obesity;Diabetes    Review Ilyas's weight is about the same.  He can tell his clothes fit better.  He continues to check BG at home.             ITP Comments:  ITP Comments     Row Name 12/08/20 1555 01/13/21 1605 01/24/21 1617 02/02/21 0616 02/07/21 1503   ITP Comments Initial telephone orientation completed. Diagnosis can be found in [facility] 6/21. EP orientation scheduled for Thursday 7/21 at 2pm. Completed 6MWT and gym orientation. Initial ITP created and sent for review to Medical Director. First full day of exercise!  Patient was oriented to gym and equipment including functions, settings, policies, and procedures.  Patient's individual exercise prescription and treatment plan were reviewed.  All starting workloads were established based on the results of the 6 minute walk test done at initial orientation visit.  The plan for exercise progression was also introduced and progression will be customized based on patient's performance and goals. 30 Day review completed. Medical Director ITP review done, changes made as directed, and signed approval by Medical Director. Darius was told to stay home today as he had been having cold like symptoms.  He was also encouraged to get tested for  COVID.     [place] Name 03/02/21 0858 03/21/21 1157 03/30/21 0659 04/27/21 0645     ITP Comments 30 day review completed. ITP sent to Dr. Emily Filbert, Medical Director of Cardiac Rehab. Continue with ITP unless changes are made by physician. Completed initial RD consultation 30 Day review completed. Medical Director ITP review done, changes made as directed, and signed approval by Medical Director. 30 Day review completed. Medical Director ITP review done, changes made as directed, and signed approval by Medical Director.             Comments:

## 2021-04-28 ENCOUNTER — Telehealth: Payer: Self-pay

## 2021-04-28 NOTE — Telephone Encounter (Signed)
Out with sick child - plans to attend tomorrow

## 2021-05-09 ENCOUNTER — Telehealth: Payer: Self-pay | Admitting: *Deleted

## 2021-05-09 ENCOUNTER — Encounter: Payer: Self-pay | Admitting: *Deleted

## 2021-05-09 DIAGNOSIS — I5022 Chronic systolic (congestive) heart failure: Secondary | ICD-10-CM

## 2021-05-09 NOTE — Telephone Encounter (Signed)
Called to check on pt. Last attended on 04/19/21.  Last call 12/1 and had said he had a sick kid and planned to return on 12/2 but did not attend.  Today, left message. We will send letter at end of week if no return contact.

## 2021-05-25 ENCOUNTER — Telehealth: Payer: Self-pay | Admitting: *Deleted

## 2021-05-25 ENCOUNTER — Encounter: Payer: Self-pay | Admitting: *Deleted

## 2021-05-25 DIAGNOSIS — I5022 Chronic systolic (congestive) heart failure: Secondary | ICD-10-CM

## 2021-05-25 NOTE — Telephone Encounter (Signed)
Nicholas Horton call to let us know that he was out of town last week and will be out again this week.  He currently has the flu A.  We told him that once his symptoms subside and fever free for 48 hours, then he can return to rehab.

## 2021-05-25 NOTE — Progress Notes (Signed)
Cardiac Individual Treatment Plan  Patient Details  Name: Nicholas Horton MRN: [id number] Date of Birth: 1975-12-06 Referring Provider:   Flowsheet Row Cardiac Rehab from 01/13/2021 in [facility] Cardiac and Pulmonary Rehab  Referring Provider Newman Pies       Initial Encounter Date:  Flowsheet Row Cardiac Rehab from 01/13/2021 in [facility] Cardiac and Pulmonary Rehab  Date 01/13/21       Visit Diagnosis: Heart failure, chronic systolic ([place])  Patient's Home Medications on Admission:  Current Outpatient Medications:    albuterol (VENTOLIN HFA) 108 (90 Base) MCG/ACT inhaler, Inhale into the lungs., Disp: , Rfl:    ARIPiprazole (ABILIFY) 10 MG tablet, Take 1 tablet (10 mg total) by mouth daily., Disp: 30 tablet, Rfl: 1   artificial tears (LACRILUBE) OINT ophthalmic ointment, Place 1 application into both eyes every 8 (eight) hours. (Patient not taking: Reported on 12/08/2020), Disp: 1 Tube, Rfl: 0   aspirin 81 MG chewable tablet, Place 1 tablet (81 mg total) into feeding tube daily. (Patient not taking: Reported on 12/08/2020), Disp: 1 tablet, Rfl: 0   buPROPion (WELLBUTRIN SR) 150 MG 12 hr tablet, Take 3 tablets (450 mg total) by mouth daily., Disp: 90 tablet, Rfl: 1   dapagliflozin propanediol (FARXIGA) 10 MG TABS tablet, Take 1 tablet by mouth daily., Disp: , Rfl:    diltiazem (CARDIZEM CD) 120 MG 24 hr capsule, Take 1 capsule (120 mg total) by mouth daily. OFFICE VISIT NEEDED, Disp: 30 capsule, Rfl: 1   furosemide (LASIX) 20 MG tablet, Take 1 tablet (20 mg total) by mouth daily., Disp: 30 tablet, Rfl: 1   Glycopyrrolate-Formoterol (BEVESPI AEROSPHERE) 9-4.8 MCG/ACT AERO, INHALE 2 PUFFS INTO THE LUNGS TWICE A DAY, Disp: , Rfl:    pantoprazole sodium (PROTONIX) 40 mg/20 mL PACK, Place 20 mLs (40 mg total) into feeding tube daily. (Patient not taking: Reported on 12/08/2020), Disp: 30 each, Rfl: 0   polyethylene glycol (MIRALAX / GLYCOLAX) packet, Place 17 g into feeding tube daily. (Patient not taking:  Reported on 12/08/2020), Disp: 14 each, Rfl: 0   senna-docusate (SENOKOT-S) 8.6-50 MG tablet, Place 2 tablets into feeding tube 2 (two) times daily. (Patient not taking: Reported on 12/08/2020), Disp: 2 tablet, Rfl: 0   spironolactone (ALDACTONE) 25 MG tablet, Take 1 tablet (25 mg total) by mouth daily., Disp: 30 tablet, Rfl: 1   tadalafil (CIALIS) 20 MG tablet, Take by mouth., Disp: , Rfl:   Past Medical History: Past Medical History:  Diagnosis Date   CHF (congestive heart failure) ([place])    Congenital bicuspid aortic valve 09/2014   - s/p AVR   Nonischemic cardiomyopathy ([place]) -- Resolved[I42.9] 09/2014   EF by Echo 20-25% (pre-op AVR) --> Echo 10/2014 & 03/2015: EF 50-55% - also Recent Normal Diastolic parameters    S/P AVR (aortic valve replacement) 10/2014   21 mm Carbometrics Mechanical Valve -- Epicardial PPM Leads   Severe aortic stenosis 10/04/2014   Presented with Syncope & CHF    Tobacco Use: Social History   Tobacco Use  Smoking Status Former   Packs/day: 2.00   Years: 20.00   Pack years: 40.00   Types: Cigarettes  Smokeless Tobacco Former  Tobacco Comments   quit 10/04/14    Labs: Recent Review Flowsheet Data     Labs for ITP Cardiac and Pulmonary Rehab Latest Ref Rng & Units 03/24/2018 03/24/2018 03/24/2018 03/24/2018 01/17/2021   Cholestrol 0 - 200 mg/dL - - - - 157   LDLCALC 0 - 99 mg/dL - - - -  60   HDL >40 mg/dL - - - - 35(L)   Trlycerides <150 mg/dL - - - - 312(H)   Hemoglobin A1c <5.7 % of total Hgb - - - - -   PHART 7.350 - 7.450 7.46(H) 7.31(L) 7.33(L) 7.49(H) -   PCO2ART 32.0 - 48.0 mmHg 60(H) 88(HH) 79(HH) 47 -   HCO3 20.0 - 28.0 mmol/L 42.7(H) 44.3(H) 41.7(H) 35.8(H) -   TCO2 0 - 100 mmol/L - - - - -   ACIDBASEDEF 0.0 - 2.0 mmol/L - - - - -   O2SAT % 93.5 85.1 99.2 98.8 -        Exercise Target Goals: Exercise Program Goal: Individual exercise prescription set using results from initial 6 min walk test and THRR while considering  patients activity  barriers and safety.   Exercise Prescription Goal: Initial exercise prescription builds to 30-45 minutes a day of aerobic activity, 2-3 days per week.  Home exercise guidelines will be given to patient during program as part of exercise prescription that the participant will acknowledge.   Education: Aerobic Exercise: - Group verbal and visual presentation on the components of exercise prescription. Introduces F.I.T.T principle from ACSM for exercise prescriptions.  Reviews F.I.T.T. principles of aerobic exercise including progression. Written material given at graduation. Flowsheet Row Cardiac Rehab from 03/23/2021 in [facility] Cardiac and Pulmonary Rehab  Date 03/23/21  Educator [facility]  Instruction Review Code 1- Verbalizes Understanding       Education: Resistance Exercise: - Group verbal and visual presentation on the components of exercise prescription. Introduces F.I.T.T principle from ACSM for exercise prescriptions  Reviews F.I.T.T. principles of resistance exercise including progression. Written material given at graduation. Flowsheet Row Cardiac Rehab from 03/23/2021 in [facility] Cardiac and Pulmonary Rehab  Date 01/26/21  Educator [facility]  Instruction Review Code 1- Verbalizes Understanding        Education: Exercise & Equipment Safety: - Individual verbal instruction and demonstration of equipment use and safety with use of the equipment. Flowsheet Row Cardiac Rehab from 03/23/2021 in [facility] Cardiac and Pulmonary Rehab  Date 01/13/21  Educator [facility]  Instruction Review Code 1- Verbalizes Understanding       Education: Exercise Physiology & General Exercise Guidelines: - Group verbal and written instruction with models to review the exercise physiology of the cardiovascular system and associated critical values. Provides general exercise guidelines with specific guidelines to those with heart or lung disease.    Education: Flexibility, Balance, Mind/Body Relaxation: - Group verbal and  visual presentation with interactive activity on the components of exercise prescription. Introduces F.I.T.T principle from ACSM for exercise prescriptions. Reviews F.I.T.T. principles of flexibility and balance exercise training including progression. Also discusses the mind body connection.  Reviews various relaxation techniques to help reduce and manage stress (i.e. Deep breathing, progressive muscle relaxation, and visualization). Balance handout provided to take home. Written material given at graduation.   Activity Barriers & Risk Stratification:  Activity Barriers & Cardiac Risk Stratification - 12/08/20 1533       Activity Barriers & Cardiac Risk Stratification   Activity Barriers None    Cardiac Risk Stratification High             6 Minute Walk:  6 Minute Walk     Row Name 01/13/21 1544         6 Minute Walk   Phase Initial     Distance 1180 feet     Walk Time 6 minutes     # of Rest Breaks 0  MPH 2.2     METS 3.68     RPE 9     Perceived Dyspnea  1     VO2 Peak 12.9     Symptoms No     Resting HR 77 bpm     Resting BP 102/58     Resting Oxygen Saturation  94 %     Exercise Oxygen Saturation  during 6 min walk 94 %     Max Ex. HR 88 bpm     Max Ex. BP 118/72     2 Minute Post BP 102/66              Oxygen Initial Assessment:   Oxygen Re-Evaluation:   Oxygen Discharge (Final Oxygen Re-Evaluation):   Initial Exercise Prescription:  Initial Exercise Prescription - 01/13/21 1500       Date of Initial Exercise RX and Referring Provider   Date 01/13/21    Referring Provider Newman Pies      Treadmill   MPH 2.2    Grade 1.5    Minutes 15    METs 3.2      REL-XR   Level 3    Speed 50    Minutes 15    METs 3.2      T5 Nustep   Level 2    SPM 80    Minutes 15    METs 3.2      Prescription Details   Frequency (times per week) 3    Duration Progress to 30 minutes of continuous aerobic without signs/symptoms of physical distress       Intensity   THRR 40-80% of Max Heartrate 116-156    Ratings of Perceived Exertion 11-13    Perceived Dyspnea 0-4      Resistance Training   Training Prescription Yes    Weight 3 lb    Reps 10-15             Perform Capillary Blood Glucose checks as needed.  Exercise Prescription Changes:   Exercise Prescription Changes     Row Name 01/13/21 1500 02/07/21 1500 02/21/21 0800 03/04/21 1100 03/07/21 1000     Response to Exercise   Blood Pressure (Admit) 102/58 110/60 150/72 -- 110/68   Blood Pressure (Exercise) 118/72 136/64 -- -- --   Blood Pressure (Exit) 102/66 104/60 118/68 -- 124/72   Heart Rate (Admit) 77 bpm 58 bpm 66 bpm -- 57 bpm   Heart Rate (Exercise) 88 bpm 103 bpm 114 bpm -- 101 bpm   Heart Rate (Exit) 83 bpm 72 bpm 83 bpm -- 70 bpm   Oxygen Saturation (Admit) 94 % -- -- -- --   Oxygen Saturation (Exercise) 94 % -- -- -- --   Rating of Perceived Exertion (Exercise) '9 13 13 ' -- 14   Perceived Dyspnea (Exercise) 1 -- -- -- --   Symptoms none none none -- none   Duration -- Progress to 30 minutes of  aerobic without signs/symptoms of physical distress Progress to 30 minutes of  aerobic without signs/symptoms of physical distress -- Continue with 30 min of aerobic exercise without signs/symptoms of physical distress.   Intensity -- THRR unchanged THRR unchanged -- THRR unchanged     Progression   Progression -- Continue to progress workloads to maintain intensity without signs/symptoms of physical distress. Continue to progress workloads to maintain intensity without signs/symptoms of physical distress. -- Continue to progress workloads to maintain intensity without signs/symptoms of physical distress.   Average METs -- 2.88  3.41 -- 5.08     Resistance Training   Training Prescription -- Yes Yes -- Yes   Weight -- 3 lb 3 lb -- 6 lb   Reps -- 10-15 10-15 -- 10-15     Interval Training   Interval Training -- No No -- No     Treadmill   MPH -- 2.2 3 -- 3   Grade  -- 1.5 2 -- 10   Minutes -- 15 15 -- 15   METs -- 3.14 4.12 -- 7.4     Recumbant Bike   Level -- -- -- -- 9   Watts -- -- -- -- 60   Minutes -- -- -- -- 15   METs -- -- -- -- 2.73     NuStep   Level -- 4 -- -- --   Minutes -- 15 -- -- --   METs -- 3.2 -- -- --     REL-XR   Level -- 3 3.5 -- --   Minutes -- 15 15 -- --   METs -- 2.3 2.7 -- --     Home Exercise Plan   Plans to continue exercise at -- -- -- Home (comment)  walk Home (comment)  walk   Frequency -- -- -- Add 2 additional days to program exercise sessions. Add 2 additional days to program exercise sessions.   Initial Home Exercises Provided -- -- -- 03/04/21 03/04/21    Row Name 03/21/21 0800 04/04/21 1200 04/18/21 0800         Response to Exercise   Blood Pressure (Admit) 118/70 102/60 118/68     Blood Pressure (Exit) 110/62 108/66 112/64     Heart Rate (Admit) 70 bpm 58 bpm 58 bpm     Heart Rate (Exercise) 110 bpm 107 bpm 114 bpm     Heart Rate (Exit) 79 bpm 69 bpm 8 bpm     Rating of Perceived Exertion (Exercise) '13 13 13     ' Symptoms none none none     Duration Continue with 30 min of aerobic exercise without signs/symptoms of physical distress. Continue with 30 min of aerobic exercise without signs/symptoms of physical distress. Continue with 30 min of aerobic exercise without signs/symptoms of physical distress.     Intensity THRR unchanged THRR unchanged THRR unchanged       Progression   Progression Continue to progress workloads to maintain intensity without signs/symptoms of physical distress. Continue to progress workloads to maintain intensity without signs/symptoms of physical distress. Continue to progress workloads to maintain intensity without signs/symptoms of physical distress.     Average METs 5.9 5.56 5.3       Resistance Training   Training Prescription Yes Yes Yes     Weight 6 lb 6 lb 6 lb     Reps 10-15 10-15 10-15       Interval Training   Interval Training No No No        Treadmill   MPH '3 3 3     ' Grade '10 10 10     ' Minutes '15 15 15     ' METs 7.4 7.4 7.4       Recumbant Bike   Level -- 8 9     Watts -- 62 51     Minutes -- 15 15     METs -- 3.5 3.5       REL-XR   Level 10 10 --     Minutes 15 15 --  METs -- 5.7 --       Home Exercise Plan   Plans to continue exercise at Home (comment)  walk Home (comment)  walk Home (comment)  walk     Frequency Add 2 additional days to program exercise sessions. Add 2 additional days to program exercise sessions. Add 2 additional days to program exercise sessions.     Initial Home Exercises Provided 03/04/21 03/04/21 03/04/21              Exercise Comments:   Exercise Comments     Row Name 01/24/21 1617           Exercise Comments First full day of exercise!  Patient was oriented to gym and equipment including functions, settings, policies, and procedures.  Patient's individual exercise prescription and treatment plan were reviewed.  All starting workloads were established based on the results of the 6 minute walk test done at initial orientation visit.  The plan for exercise progression was also introduced and progression will be customized based on patient's performance and goals.                Exercise Goals and Review:   Exercise Goals     Row Name 01/13/21 1551             Exercise Goals   Increase Physical Activity Yes       Intervention Provide advice, education, support and counseling about physical activity/exercise needs.;Develop an individualized exercise prescription for aerobic and resistive training based on initial evaluation findings, risk stratification, comorbidities and participant's personal goals.       Expected Outcomes Short Term: Attend rehab on a regular basis to increase amount of physical activity.;Long Term: Add in home exercise to make exercise part of routine and to increase amount of physical activity.;Long Term: Exercising regularly at least 3-5 days a week.        Increase Strength and Stamina Yes       Intervention Provide advice, education, support and counseling about physical activity/exercise needs.;Develop an individualized exercise prescription for aerobic and resistive training based on initial evaluation findings, risk stratification, comorbidities and participant's personal goals.       Expected Outcomes Short Term: Increase workloads from initial exercise prescription for resistance, speed, and METs.;Short Term: Perform resistance training exercises routinely during rehab and add in resistance training at home;Long Term: Improve cardiorespiratory fitness, muscular endurance and strength as measured by increased METs and functional capacity (6MWT)       Able to understand and use rate of perceived exertion (RPE) scale Yes       Intervention Provide education and explanation on how to use RPE scale       Expected Outcomes Short Term: Able to use RPE daily in rehab to express subjective intensity level;Long Term:  Able to use RPE to guide intensity level when exercising independently       Able to understand and use Dyspnea scale Yes       Intervention Provide education and explanation on how to use Dyspnea scale       Expected Outcomes Short Term: Able to use Dyspnea scale daily in rehab to express subjective sense of shortness of breath during exertion;Long Term: Able to use Dyspnea scale to guide intensity level when exercising independently       Knowledge and understanding of Target Heart Rate Range (THRR) Yes       Intervention Provide education and explanation of THRR including how the numbers were predicted and  where they are located for reference       Expected Outcomes Short Term: Able to state/look up THRR;Short Term: Able to use daily as guideline for intensity in rehab;Long Term: Able to use THRR to govern intensity when exercising independently       Able to check pulse independently Yes       Intervention Provide education and  demonstration on how to check pulse in carotid and radial arteries.;Review the importance of being able to check your own pulse for safety during independent exercise       Expected Outcomes Short Term: Able to explain why pulse checking is important during independent exercise;Long Term: Able to check pulse independently and accurately       Understanding of Exercise Prescription Yes                Exercise Goals Re-Evaluation :  Exercise Goals Re-Evaluation     Row Name 01/24/21 1617 02/07/21 1505 02/21/21 0832 03/04/21 1130 03/07/21 1021     Exercise Goal Re-Evaluation   Exercise Goals Review Increase Physical Activity;Able to understand and use rate of perceived exertion (RPE) scale;Knowledge and understanding of Target Heart Rate Range (THRR);Understanding of Exercise Prescription;Increase Strength and Stamina;Able to understand and use Dyspnea scale;Able to check pulse independently Increase Physical Activity;Increase Strength and Stamina;Understanding of Exercise Prescription Increase Physical Activity;Increase Strength and Stamina Increase Physical Activity;Able to understand and use rate of perceived exertion (RPE) scale;Knowledge and understanding of Target Heart Rate Range (THRR);Understanding of Exercise Prescription;Increase Strength and Stamina;Able to check pulse independently Increase Physical Activity;Increase Strength and Stamina   Comments Reviewed RPE and dyspnea scales, THR and program prescription with pt today.  Pt voiced understanding and was given a copy of goals to take home. Nicholas Horton is off to a good start in rehab.  He is now on level 4 on the NuStep.  We will continue to monitor his progress. Nicholas Horton is doing well and has increased speed and grade on TM.  Staff will encourage increasing weights for strength work Reviewed home exercise with pt today.  Pt plans to walk for exercise.  Reviewed THR, pulse, RPE, sign and symptoms, pulse oximetery and when to call 911 or MD.   Also discussed weather considerations and indoor options.  Pt voiced understanding. Nicholas Horton is doing well in rehab. He has built up to 6 lbs for handweights and level 9 on the recumbant bike and reached 60 watts. Treadmill incline to up to 10%. Will continue to monitor, along with consistent attendance.   Expected Outcomes Short: Use RPE daily to regulate intensity. Long: Follow program prescription in THR. Short: Increase treadmill. Long: Continue to improve stamina Short: increase weights Long:improve average MET level Short: add 2 extra days of exercise. Long: independently exercise after graduation Short: Continue to increase handweights Long: Continue to increase overall MET level    Row Name 03/21/21 0844 04/04/21 1213 04/11/21 1113 04/18/21 0855 05/02/21 1017     Exercise Goal Re-Evaluation   Exercise Goals Review Increase Physical Activity;Increase Strength and Stamina Increase Physical Activity;Increase Strength and Stamina;Understanding of Exercise Prescription Increase Physical Activity;Increase Strength and Stamina Increase Physical Activity;Increase Strength and Stamina Increase Physical Activity;Increase Strength and Stamina   Comments Nicholas Horton continues to do well whe he attends.  He was only here once per week the past 2 weeks.  Staff wil review importance of being consistent. Nicholas Horton is doing well in rehab.  He is enjoying doing intervals and  up to 5.7 METs on the XR.  We will continue to monitor his progress. Nicholas Horton is walking 2-3 days when not in HT.  He does not check HR while walking.  He does feel his RPE is abot the same as TM.  He does know how to check HR and staf reviewed importance of making sure he works in [employer] range. Nicholas Horton continues to do well in HT and is on level 9 on the bike.  He is currently using 6 lb for strength work. Nicholas Horton has not attended rehab since 11/22  and has no progress to note on. Routine attendance would yield better results.   Expected Outcomes Short:  attend 2-3 times per week Long:improve overall stamina Short: Continue to add intervals Long: Continue to improve stamina. Short: check HR while walking Long: continue to build stamina Short: try 7 lb Long:  continue to improve stamina Short: Maintain consistent attendance Long: Continue to increase overall MET level    Row Name 05/09/21 1631             Exercise Goal Re-Evaluation   Comments Continues to be out                Discharge Exercise Prescription (Final Exercise Prescription Changes):  Exercise Prescription Changes - 04/18/21 0800       Response to Exercise   Blood Pressure (Admit) 118/68    Blood Pressure (Exit) 112/64    Heart Rate (Admit) 58 bpm    Heart Rate (Exercise) 114 bpm    Heart Rate (Exit) 8 bpm    Rating of Perceived Exertion (Exercise) 13    Symptoms none    Duration Continue with 30 min of aerobic exercise without signs/symptoms of physical distress.    Intensity THRR unchanged      Progression   Progression Continue to progress workloads to maintain intensity without signs/symptoms of physical distress.    Average METs 5.3      Resistance Training   Training Prescription Yes    Weight 6 lb    Reps 10-15      Interval Training   Interval Training No      Treadmill   MPH 3    Grade 10    Minutes 15    METs 7.4      Recumbant Bike   Level 9    Watts 51    Minutes 15    METs 3.5      Home Exercise Plan   Plans to continue exercise at Home (comment)   walk   Frequency Add 2 additional days to program exercise sessions.    Initial Home Exercises Provided 03/04/21             Nutrition:  Target Goals: Understanding of nutrition guidelines, daily intake of sodium <1567m, cholesterol <2034m calories 30% from fat and 7% or less from saturated fats, daily to have 5 or more servings of fruits and vegetables.  Education: All About Nutrition: -Group instruction provided by verbal, written material, interactive activities,  discussions, models, and posters to present general guidelines for heart healthy nutrition including fat, fiber, MyPlate, the role of sodium in heart healthy nutrition, utilization of the nutrition label, and utilization of this knowledge for meal planning. Follow up email sent as well. Written material given at graduation. [facility]  Date 02/16/21  [facility] [place] 1- Verbalizes Understanding       Biometrics:  Pre Biometrics - 01/13/21 1551  Pre Biometrics   Height 6' 0.5" (1.842 m)    Weight 239 lb 3.2 oz (108.5 kg)    BMI (Calculated) 31.98    Single Leg Stand 30 seconds              Nutrition Therapy Plan and Nutrition Goals:  Nutrition Therapy & Goals - 03/21/21 1030       Nutrition Therapy   Diet Heart healthy, low Na, diabetes frienldy    Drug/Food Interactions Coumadin/Vit K   pt reports taking coumadin   Protein (specify units) 80g    Fiber 30 grams    Whole Grain Foods 3 servings    Saturated Fats 12 max. grams    Fruits and Vegetables 8 servings/day    Sodium 1.5 grams      Personal Nutrition Goals   Nutrition Goal ST: 2-4 CHO per meal (75g CHo is 5 servings - from recall ot seems he would like to keep it on the lower end) LT: improve energy, at least 1 whole grain per day, 8 fruits/vegetables per day, at least 2 CHO servings per meal    Comments He is on a low CHO diet - he defines as <75g/meal, from recall ot seems he would like to keep it on the lower end: He eats a lot of salads and grilled chicken - he has fried chicken sometimes. He will eat hamburgers and all beef hotdogs - rarely has them. He has red meat 3x/week. B: 8 am: cereal (captain crunch with lactose free whole milk) L: [place] sandwich with chips (Arnold keto bread - 8g fiber) S: sugar free jello or atkins protein bar with tea with splenda. D: varies: grilled chicken or friend chicken, hamburger or  hamburger steak. He has his protein on the keto bread. 2-3 vegetables: peas, carrots, he doesn't like corn, salads, riced caulifower, small amount of leafy greens with coumadin. He will sometimes use butter or olive oil. He does not salt his food. Discussed heart healthy eating and diabetes friendly eaitng.      Intervention Plan   Intervention Nutrition handout(s) given to patient.;Prescribe, educate and counsel regarding individualized specific dietary modifications aiming towards targeted core components such as weight, hypertension, lipid management, diabetes, heart failure and other comorbidities.    Expected Outcomes Short Term Goal: Understand basic principles of dietary content, such as calories, fat, sodium, cholesterol and nutrients.;Short Term Goal: A plan has been developed with personal nutrition goals set during dietitian appointment.;Long Term Goal: Adherence to prescribed nutrition plan.             Nutrition Assessments:  MEDIFICTS Score Key: ?70 Need to make dietary changes  40-70 Heart Healthy Diet ? 40 Therapeutic Level Cholesterol Diet  Flowsheet Row Cardiac Rehab from 01/13/2021 in [facility] Cardiac and Pulmonary Rehab  Picture Your Plate Total Score on Admission 62      Picture Your Plate Scores: <44 Unhealthy dietary pattern with much room for improvement. 41-50 Dietary pattern unlikely to meet recommendations for good health and room for improvement. 51-60 More healthful dietary pattern, with some room for improvement.  >60 Healthy dietary pattern, although there may be some specific behaviors that could be improved.    Nutrition Goals Re-Evaluation:  Nutrition Goals Re-Evaluation     [place] Name 02/16/21 1125 04/11/21 1116           Goals   Nutrition Goal -- Nicholas Horton has been eating smaller portions since meeting with RD.  He does feel satisfied and not hungry betwen  meals.  He is working on having the recommended amoutn of carbs.      Comment Nicholas Horton has not  met with RD yet. --      Expected Outcome -- Short:  continue to follow RD advice Long: maintain heart healthy diet               Nutrition Goals Discharge (Final Nutrition Goals Re-Evaluation):  Nutrition Goals Re-Evaluation - 04/11/21 1116       Goals   Nutrition Goal Nicholas Horton has been eating smaller portions since meeting with RD.  He does feel satisfied and not hungry betwen meals.  He is working on having the recommended amoutn of carbs.    Expected Outcome Short:  continue to follow RD advice Long: maintain heart healthy diet             Psychosocial: Target Goals: Acknowledge presence or absence of significant depression and/or stress, maximize coping skills, provide positive support system. Participant is able to verbalize types and ability to use techniques and skills needed for reducing stress and depression.   Education: Stress, Anxiety, and Depression - Group verbal and visual presentation to define topics covered.  Reviews how body is impacted by stress, anxiety, and depression.  Also discusses healthy ways to reduce stress and to treat/manage anxiety and depression.  Written material given at graduation. Flowsheet Row Cardiac Rehab from 03/23/2021 in [facility] Cardiac and Pulmonary Rehab  Date 03/09/21  Educator [employer] [facility]  Instruction Review Code 1- Verbalizes Understanding       Education: Sleep Hygiene -Provides group verbal and written instruction about how sleep can affect your health.  Define sleep hygiene, discuss sleep cycles and impact of sleep habits. Review good sleep hygiene tips.    Initial Review & Psychosocial Screening:  Initial Psych Review & Screening - 12/08/20 1534       Initial Review   Current issues with Current Sleep Concerns;History of Depression;Current Depression;Current Stress Concerns;Current Psychotropic Meds    Source of Stress Concerns Unable to participate in former interests or hobbies;Unable to perform yard/household activities       [place]? Yes   fiance     Barriers   Psychosocial barriers to participate in program There are no identifiable barriers or psychosocial needs.      Screening Interventions   Interventions Encouraged to exercise;Provide feedback about the scores to participant;To provide support and resources with identified psychosocial needs    Expected Outcomes Short Term goal: Utilizing psychosocial counselor, staff and physician to assist with identification of specific Stressors or current issues interfering with healing process. Setting desired goal for each stressor or current issue identified.;Long Term Goal: Stressors or current issues are controlled or eliminated.;Short Term goal: Identification and review with participant of any Quality of Life or Depression concerns found by scoring the questionnaire.;Long Term goal: The participant improves quality of Life and PHQ9 Scores as seen by post scores and/or verbalization of changes             Quality of Life Scores:   Quality of Life - 01/13/21 1554       Quality of Life   Select Quality of Life      Quality of Life Scores   Health/Function Pre 9.63 %    Socioeconomic Pre 17.43 %    Psych/Spiritual Pre 9.57 %    Family Pre 15.6 %    GLOBAL Pre 12.1 %  Scores of 19 and below usually indicate a poorer quality of life in these areas.  A difference of  2-3 points is a clinically meaningful difference.  A difference of 2-3 points in the total score of the Quality of Life Index has been associated with significant improvement in overall quality of life, self-image, physical symptoms, and general health in studies assessing change in quality of life.  PHQ-9: Recent Review Flowsheet Data     Depression screen [facility] 2/9 02/16/2021 01/13/2021 02/18/2018 12/19/2017   Decreased Interest '1 2 2 2   ' Down, Depressed, Hopeless '1 3  2 2   ' PHQ - 2 Score '2 5 4 4   ' Altered sleeping '2 2 2 3   ' Tired, decreased energy '1  2 2 3   ' Change in appetite '2 1 3 2   ' Feeling bad or failure about yourself  '2 3 2 2   ' Trouble concentrating 1 0 2 3   Moving slowly or fidgety/restless '1 1 2 2   ' Suicidal thoughts 0 3  0 1   PHQ-9 Score '11 17 17 20   ' Difficult doing work/chores Somewhat difficult Somewhat difficult Extremely dIfficult Somewhat difficult      Interpretation of Total Score  Total Score Depression Severity:  1-4 = Minimal depression, 5-9 = Mild depression, 10-14 = Moderate depression, 15-19 = Moderately severe depression, 20-27 = Severe depression   Psychosocial Evaluation and Intervention:  Psychosocial Evaluation - 12/08/20 1549       Psychosocial Evaluation & Interventions   Interventions Encouraged to exercise with the program and follow exercise prescription;Stress management education;Relaxation education    Comments Nicholas Horton is coming to cardiac rehab for Systolic HF. He has had issues with his heart since 2016 when he got an AVR. Since then, he has gotten a pacemaker, developed COPD, is being worked up for a possible ICD, and is also a diabetic. He is on treatment for his depression and reports it works "most of the time" and his fiance helps keep his mood more level by being attentive to his mood and talking things through. His biggest goal during this program is to lose weight as he feels this will really help his heart. His main motivation for maintaining a heart healthy lifestyle is his 46 year old daughter. He states his sleep is very irregular, most nights going to bed very late and sleeping late. He tries to do things around the house even though he doesn't want to and he notices his shortness of breath which leads to chest discomfort happens during a lot of physical activity. He has been trying to walk more but the summer heat has made that difficult.    Expected Outcomes Short: attend cardiac rehab for education and exercise. Long: develop and maintain positive self care habits.    Continue  Psychosocial Services  Follow up required by staff             Psychosocial Re-Evaluation:  Psychosocial Re-Evaluation     Nicholas Horton Name 02/16/21 1119 03/21/21 1117 04/11/21 1118         Psychosocial Re-Evaluation   Current issues with Current Stress Concerns;Current Psychotropic Meds;History of Depression;Current Sleep Concerns Current Stress Concerns;History of Depression;Current Sleep Concerns Current Stress Concerns;History of Depression;Current Sleep Concerns     Comments Reviewed PHQ-9 with patient and score has improved.  He states he feels better overall.  He has trouble with sleep.  We reveiwed not having caffeine on the afternoon, as well not not eating too  late, not using phone or computer right before bed.  He reports taking meds as directed. Nicholas Horton takes Wellbutrin for depression.  He reports sleeping a little better.  He can nap if he has a night he doesnt sleep well.  He is not seeing a counselor currently but pplans to check into it. Nicholas Horton states his mind is clear.  He still is slightly depressed.  He is looking into other activties to help him get out of the house to help with mood.  He has a group he can go with.  No changes in sleep patterns     Expected Outcomes Short: work on better sleep habits Long:  be able to sleep better consistently, exercise to help with symptoms of depression and anxiety Short: continue to work on sleep patterns and exercise to help with stress/depression Long: manage stress/depression with help of meds and exercise Short: try going out with friends Long:  manage stress/depression with exercise and medication              Psychosocial Discharge (Final Psychosocial Re-Evaluation):  Psychosocial Re-Evaluation - 04/11/21 1118       Psychosocial Re-Evaluation   Current issues with Current Stress Concerns;History of Depression;Current Sleep Concerns    Comments Nicholas Horton states his mind is clear.  He still is slightly depressed.  He is looking into  other activties to help him get out of the house to help with mood.  He has a group he can go with.  No changes in sleep patterns    Expected Outcomes Short: try going out with friends Long:  manage stress/depression with exercise and medication             Vocational Rehabilitation: Provide vocational rehab assistance to qualifying candidates.   Vocational Rehab Evaluation & Intervention:  Vocational Rehab - 12/08/20 1543       Initial Vocational Rehab Evaluation & Intervention   Assessment shows need for Vocational Rehabilitation No             Education: Education Goals: Education classes will be provided on a variety of topics geared toward better understanding of heart health and risk factor modification. Participant will state understanding/return demonstration of topics presented as noted by education test scores.  Learning Barriers/Preferences:  Learning Barriers/Preferences - 12/08/20 1542       Learning Barriers/Preferences   Learning Barriers None    Learning Preferences None             General Cardiac Education Topics:  AED/CPR: - Group verbal and written instruction with the use of models to demonstrate the basic use of the AED with the basic ABC's of resuscitation.   Anatomy and Cardiac Procedures: - Group verbal and visual presentation and models provide information about basic cardiac anatomy and function. Reviews the testing methods done to diagnose heart disease and the outcomes of the test results. Describes the treatment choices: Medical Management, Angioplasty, or Coronary Bypass Surgery for treating various heart conditions including Myocardial Infarction, Angina, Valve Disease, and Cardiac Arrhythmias.  Written material given at graduation. Flowsheet Row Cardiac Rehab from 03/23/2021 in [facility] Cardiac and Pulmonary Rehab  Date 01/26/21  Educator [facility]  Instruction Review Code 1- Verbalizes Understanding       Medication Safety: - Group  verbal and visual instruction to review commonly prescribed medications for heart and lung disease. Reviews the medication, class of the drug, and side effects. Includes the steps to properly store meds and maintain the prescription regimen.  Written material  given at graduation.   Intimacy: - Group verbal instruction through game format to discuss how heart and lung disease can affect sexual intimacy. Written material given at graduation.. Flowsheet Row Cardiac Rehab from 03/23/2021 in [facility] Cardiac and Pulmonary Rehab  Date 03/23/21  Educator [facility]  Instruction Review Code 1- Verbalizes Understanding       Know Your Numbers and Heart Failure: - Group verbal and visual instruction to discuss disease risk factors for cardiac and pulmonary disease and treatment options.  Reviews associated critical values for Overweight/Obesity, Hypertension, Cholesterol, and Diabetes.  Discusses basics of heart failure: signs/symptoms and treatments.  Introduces Heart Failure Zone chart for action plan for heart failure.  Written material given at graduation.   Infection Prevention: - Provides verbal and written material to individual with discussion of infection control including proper hand washing and proper equipment cleaning during exercise session. Flowsheet Row Cardiac Rehab from 03/23/2021 in [facility] Cardiac and Pulmonary Rehab  Date 01/13/21  Educator AS  Instruction Review Code 1- Verbalizes Understanding       Falls Prevention: - Provides verbal and written material to individual with discussion of falls prevention and safety. Flowsheet Row Cardiac Rehab from 03/23/2021 in [facility] Cardiac and Pulmonary Rehab  Date 01/13/21  Educator AS  Instruction Review Code 1- Verbalizes Understanding       Other: -Provides group and verbal instruction on various topics (see comments)   Knowledge Questionnaire Score:   Core Components/Risk Factors/Patient Goals at Admission:  Personal Goals and Risk  Factors at Admission - 01/13/21 1559       Core Components/Risk Factors/Patient Goals on Admission    Weight Management Yes;Weight Loss    Intervention Weight Management: Develop a combined nutrition and exercise program designed to reach desired caloric intake, while maintaining appropriate intake of nutrient and fiber, sodium and fats, and appropriate energy expenditure required for the weight goal.;Weight Management: Provide education and appropriate resources to help participant work on and attain dietary goals.;Weight Management/Obesity: Establish reasonable short term and long term weight goals.    Admit Weight 239 lb 3.2 oz (108.5 kg)    Goal Weight: Short Term 225 lb (102.1 kg)    Goal Weight: Long Term 200 lb (90.7 kg)    Expected Outcomes Short Term: Continue to assess and modify interventions until short term weight is achieved;Long Term: Adherence to nutrition and physical activity/exercise program aimed toward attainment of established weight goal;Weight Loss: Understanding of general recommendations for a balanced deficit meal plan, which promotes 1-2 lb weight loss per week and includes a negative energy balance of [phone] kcal/d;Understanding of distribution of calorie intake throughout the day with the consumption of 4-5 meals/snacks;Understanding recommendations for meals to include 15-35% energy as protein, 25-35% energy from fat, 35-60% energy from carbohydrates, less than 234m of dietary cholesterol, 20-35 gm of total fiber daily    Improve shortness of breath with ADL's Yes    Intervention Provide education, individualized exercise plan and daily activity instruction to help decrease symptoms of SOB with activities of daily living.    Expected Outcomes Short Term: Improve cardiorespiratory fitness to achieve a reduction of symptoms when performing ADLs;Long Term: Be able to perform more ADLs without symptoms or delay the onset of symptoms    Diabetes Yes    Intervention Provide  education about signs/symptoms and action to take for hypo/hyperglycemia.;Provide education about proper nutrition, including hydration, and aerobic/resistive exercise prescription along with prescribed medications to achieve blood glucose in normal ranges: Fasting glucose 65-99 mg/dL  Expected Outcomes Short Term: Participant verbalizes understanding of the signs/symptoms and immediate care of hyper/hypoglycemia, proper foot care and importance of medication, aerobic/resistive exercise and nutrition plan for blood glucose control.;Long Term: Attainment of HbA1C < 7%.    Heart Failure Yes    Intervention Provide a combined exercise and nutrition program that is supplemented with education, support and counseling about heart failure. Directed toward relieving symptoms such as shortness of breath, decreased exercise tolerance, and extremity edema.    Expected Outcomes Improve functional capacity of life;Short term: Attendance in program 2-3 days a week with increased exercise capacity. Reported lower sodium intake. Reported increased fruit and vegetable intake. Reports medication compliance.;Short term: Daily weights obtained and reported for increase. Utilizing diuretic protocols set by physician.;Long term: Adoption of self-care skills and reduction of barriers for early signs and symptoms recognition and intervention leading to self-care maintenance.             Education:Diabetes - Individual verbal and written instruction to review signs/symptoms of diabetes, desired ranges of glucose level fasting, after meals and with exercise. Acknowledge that pre and post exercise glucose checks will be done for 3 sessions at entry of program. [place] from 03/23/2021 in [facility] Cardiac and Pulmonary Rehab  Date 01/13/21  Educator AS  Instruction Review Code 1- Verbalizes Understanding       Core Components/Risk Factors/Patient Goals Review:   Goals and Risk Factor Review     Row  Name 02/16/21 1116 03/21/21 1114 04/11/21 1112         Core Components/Risk Factors/Patient Goals Review   Personal Goals Review Diabetes;Improve shortness of breath with ADL's;Weight Management/Obesity Weight Management/Obesity;Diabetes Weight Management/Obesity;Diabetes     Review Nicholas Horton's weight is steady.  He states he feels better and is getting energy back.   He checks BG at home a couple times a week. Nicholas Horton has lost 5 lb since stating the program.  He continues to check BG at home FBG has been 93-115.  he feels pretty good about overall stamina Nicholas Horton's weight is about the same.  He can tell his clothes fit better.  He continues to check BG at home.     Expected Outcomes Short: continue to check BG at home Long: manage risk factors long term Short: continue to monitor BG Long: keep risk factors at optimal levels --              Core Components/Risk Factors/Patient Goals at Discharge (Final Review):   Goals and Risk Factor Review - 04/11/21 1112       Core Components/Risk Factors/Patient Goals Review   Personal Goals Review Weight Management/Obesity;Diabetes    Review Nicholas Horton's weight is about the same.  He can tell his clothes fit better.  He continues to check BG at home.             ITP Comments:   Comments:

## 2021-06-01 ENCOUNTER — Encounter: Payer: Self-pay | Admitting: *Deleted

## 2021-06-01 ENCOUNTER — Encounter: Payer: Medicare HMO | Attending: Cardiology

## 2021-06-01 DIAGNOSIS — I5022 Chronic systolic (congestive) heart failure: Secondary | ICD-10-CM

## 2021-06-02 ENCOUNTER — Telehealth: Payer: Self-pay

## 2021-06-02 NOTE — Telephone Encounter (Signed)
Breken plans to return tomorrow

## 2021-06-21 ENCOUNTER — Encounter: Payer: Self-pay | Admitting: *Deleted

## 2021-06-21 DIAGNOSIS — I5022 Chronic systolic (congestive) heart failure: Secondary | ICD-10-CM

## 2021-06-21 NOTE — Progress Notes (Signed)
Cardiac Individual Treatment Plan  Patient Details  Name: Nicholas Horton MRN: [id number] Date of Birth: 04/30/1976 Referring Provider:   Flowsheet Row Cardiac Rehab from 01/13/2021 in [facility] Cardiac and Pulmonary Rehab  Referring Provider Nicholas Horton       Initial Encounter Date:  Flowsheet Row Cardiac Rehab from 01/13/2021 in [facility] Cardiac and Pulmonary Rehab  Date 01/13/21       Visit Diagnosis: Heart failure, chronic systolic ([place])  Patient's Home Medications on Admission:  Current Outpatient Medications:    albuterol (VENTOLIN HFA) 108 (90 Base) MCG/ACT inhaler, Inhale into the lungs., Disp: , Rfl:    ARIPiprazole (ABILIFY) 10 MG tablet, Take 1 tablet (10 mg total) by mouth daily., Disp: 30 tablet, Rfl: 1   artificial tears (LACRILUBE) OINT ophthalmic ointment, Place 1 application into both eyes every 8 (eight) hours. (Patient not taking: Reported on 12/08/2020), Disp: 1 Tube, Rfl: 0   aspirin 81 MG chewable tablet, Place 1 tablet (81 mg total) into feeding tube daily. (Patient not taking: Reported on 12/08/2020), Disp: 1 tablet, Rfl: 0   buPROPion (WELLBUTRIN SR) 150 MG 12 hr tablet, Take 3 tablets (450 mg total) by mouth daily., Disp: 90 tablet, Rfl: 1   dapagliflozin propanediol (FARXIGA) 10 MG TABS tablet, Take 1 tablet by mouth daily., Disp: , Rfl:    diltiazem (CARDIZEM CD) 120 MG 24 hr capsule, Take 1 capsule (120 mg total) by mouth daily. OFFICE VISIT NEEDED, Disp: 30 capsule, Rfl: 1   furosemide (LASIX) 20 MG tablet, Take 1 tablet (20 mg total) by mouth daily., Disp: 30 tablet, Rfl: 1   Glycopyrrolate-Formoterol (BEVESPI AEROSPHERE) 9-4.8 MCG/ACT AERO, INHALE 2 PUFFS INTO THE LUNGS TWICE A DAY, Disp: , Rfl:    pantoprazole sodium (PROTONIX) 40 mg/20 mL PACK, Place 20 mLs (40 mg total) into feeding tube daily. (Patient not taking: Reported on 12/08/2020), Disp: 30 each, Rfl: 0   polyethylene glycol (MIRALAX / GLYCOLAX) packet, Place 17 g into feeding tube daily. (Patient not taking:  Reported on 12/08/2020), Disp: 14 each, Rfl: 0   senna-docusate (SENOKOT-S) 8.6-50 MG tablet, Place 2 tablets into feeding tube 2 (two) times daily. (Patient not taking: Reported on 12/08/2020), Disp: 2 tablet, Rfl: 0   spironolactone (ALDACTONE) 25 MG tablet, Take 1 tablet (25 mg total) by mouth daily., Disp: 30 tablet, Rfl: 1   tadalafil (CIALIS) 20 MG tablet, Take by mouth., Disp: , Rfl:   Past Medical History: Past Medical History:  Diagnosis Date   CHF (congestive heart failure) ([place])    Congenital bicuspid aortic valve 09/2014   - s/p AVR   Nonischemic cardiomyopathy ([place]) -- Resolved[I42.9] 09/2014   EF by Echo 20-25% (pre-op AVR) --> Echo 10/2014 & 03/2015: EF 50-55% - also Recent Normal Diastolic parameters    S/P AVR (aortic valve replacement) 10/2014   21 mm Carbometrics Mechanical Valve -- Epicardial PPM Leads   Severe aortic stenosis 10/04/2014   Presented with Syncope & CHF    Tobacco Use: Social History   Tobacco Use  Smoking Status Former   Packs/day: 2.00   Years: 20.00   Pack years: 40.00   Types: Cigarettes  Smokeless Tobacco Former  Tobacco Comments   quit 10/04/14    Labs: Recent Review Flowsheet Data     Labs for ITP Cardiac and Pulmonary Rehab Latest Ref Rng & Units 03/24/2018 03/24/2018 03/24/2018 03/24/2018 01/17/2021   Cholestrol 0 - 200 mg/dL - - - - 157   LDLCALC 0 - 99 mg/dL - - - -  60   HDL >40 mg/dL - - - - 35(L)   Trlycerides <150 mg/dL - - - - 312(H)   Hemoglobin A1c <5.7 % of total Hgb - - - - -   PHART 7.350 - 7.450 7.46(H) 7.31(L) 7.33(L) 7.49(H) -   PCO2ART 32.0 - 48.0 mmHg 60(H) 88(HH) 79(HH) 47 -   HCO3 20.0 - 28.0 mmol/L 42.7(H) 44.3(H) 41.7(H) 35.8(H) -   TCO2 0 - 100 mmol/L - - - - -   ACIDBASEDEF 0.0 - 2.0 mmol/L - - - - -   O2SAT % 93.5 85.1 99.2 98.8 -        Exercise Target Goals: Exercise Program Goal: Individual exercise prescription set using results from initial 6 min walk test and THRR while considering  patients activity  barriers and safety.   Exercise Prescription Goal: Initial exercise prescription builds to 30-45 minutes a day of aerobic activity, 2-3 days per week.  Home exercise guidelines will be given to patient during program as part of exercise prescription that the participant will acknowledge.   Education: Aerobic Exercise: - Group verbal and visual presentation on the components of exercise prescription. Introduces F.I.T.T principle from ACSM for exercise prescriptions.  Reviews F.I.T.T. principles of aerobic exercise including progression. Written material given at graduation. Flowsheet Row Cardiac Rehab from 03/23/2021 in [facility] Cardiac and Pulmonary Rehab  Date 03/23/21  Educator [facility]  Instruction Review Code 1- Verbalizes Understanding       Education: Resistance Exercise: - Group verbal and visual presentation on the components of exercise prescription. Introduces F.I.T.T principle from ACSM for exercise prescriptions  Reviews F.I.T.T. principles of resistance exercise including progression. Written material given at graduation. Flowsheet Row Cardiac Rehab from 03/23/2021 in [facility] Cardiac and Pulmonary Rehab  Date 01/26/21  Educator [facility]  Instruction Review Code 1- Verbalizes Understanding        Education: Exercise & Equipment Safety: - Individual verbal instruction and demonstration of equipment use and safety with use of the equipment. Flowsheet Row Cardiac Rehab from 03/23/2021 in [facility] Cardiac and Pulmonary Rehab  Date 01/13/21  Educator [facility]  Instruction Review Code 1- Verbalizes Understanding       Education: Exercise Physiology & General Exercise Guidelines: - Group verbal and written instruction with models to review the exercise physiology of the cardiovascular system and associated critical values. Provides general exercise guidelines with specific guidelines to those with heart or lung disease.    Education: Flexibility, Balance, Mind/Body Relaxation: - Group verbal and  visual presentation with interactive activity on the components of exercise prescription. Introduces F.I.T.T principle from ACSM for exercise prescriptions. Reviews F.I.T.T. principles of flexibility and balance exercise training including progression. Also discusses the mind body connection.  Reviews various relaxation techniques to help reduce and manage stress (i.e. Deep breathing, progressive muscle relaxation, and visualization). Balance handout provided to take home. Written material given at graduation.   Activity Barriers & Risk Stratification:   6 Minute Walk:  6 Minute Walk     Row Name 01/13/21 1544         6 Minute Walk   Phase Initial     Distance 1180 feet     Walk Time 6 minutes     # of Rest Breaks 0     MPH 2.2     METS 3.68     RPE 9     Perceived Dyspnea  1     VO2 Peak 12.9     Symptoms No     Resting  HR 77 bpm     Resting BP 102/58     Resting Oxygen Saturation  94 %     Exercise Oxygen Saturation  during 6 min walk 94 %     Max Ex. HR 88 bpm     Max Ex. BP 118/72     2 Minute Post BP 102/66              Oxygen Initial Assessment:   Oxygen Re-Evaluation:   Oxygen Discharge (Final Oxygen Re-Evaluation):   Initial Exercise Prescription:  Initial Exercise Prescription - 01/13/21 1500       Date of Initial Exercise RX and Referring Provider   Date 01/13/21    Referring Provider Nicholas Horton      Treadmill   MPH 2.2    Grade 1.5    Minutes 15    METs 3.2      REL-XR   Level 3    Speed 50    Minutes 15    METs 3.2      T5 Nustep   Level 2    SPM 80    Minutes 15    METs 3.2      Prescription Details   Frequency (times per week) 3    Duration Progress to 30 minutes of continuous aerobic without signs/symptoms of physical distress      Intensity   THRR 40-80% of Max Heartrate 116-156    Ratings of Perceived Exertion 11-13    Perceived Dyspnea 0-4      Resistance Training   Training Prescription Yes    Weight 3 lb    Reps  10-15             Perform Capillary Blood Glucose checks as needed.  Exercise Prescription Changes:   Exercise Prescription Changes     Row Name 01/13/21 1500 02/07/21 1500 02/21/21 0800 03/04/21 1100 03/07/21 1000     Response to Exercise   Blood Pressure (Admit) 102/58 110/60 150/72 -- 110/68   Blood Pressure (Exercise) 118/72 136/64 -- -- --   Blood Pressure (Exit) 102/66 104/60 118/68 -- 124/72   Heart Rate (Admit) 77 bpm 58 bpm 66 bpm -- 57 bpm   Heart Rate (Exercise) 88 bpm 103 bpm 114 bpm -- 101 bpm   Heart Rate (Exit) 83 bpm 72 bpm 83 bpm -- 70 bpm   Oxygen Saturation (Admit) 94 % -- -- -- --   Oxygen Saturation (Exercise) 94 % -- -- -- --   Rating of Perceived Exertion (Exercise) '9 13 13 ' -- 14   Perceived Dyspnea (Exercise) 1 -- -- -- --   Symptoms none none none -- none   Duration -- Progress to 30 minutes of  aerobic without signs/symptoms of physical distress Progress to 30 minutes of  aerobic without signs/symptoms of physical distress -- Continue with 30 min of aerobic exercise without signs/symptoms of physical distress.   Intensity -- THRR unchanged THRR unchanged -- THRR unchanged     Progression   Progression -- Continue to progress workloads to maintain intensity without signs/symptoms of physical distress. Continue to progress workloads to maintain intensity without signs/symptoms of physical distress. -- Continue to progress workloads to maintain intensity without signs/symptoms of physical distress.   Average METs -- 2.88 3.41 -- 5.08     Resistance Training   Training Prescription -- Yes Yes -- Yes   Weight -- 3 lb 3 lb -- 6 lb   Reps -- 10-15 10-15 -- 10-15  Interval Training   Interval Training -- No No -- No     Treadmill   MPH -- 2.2 3 -- 3   Grade -- 1.5 2 -- 10   Minutes -- 15 15 -- 15   METs -- 3.14 4.12 -- 7.4     Recumbant Bike   Level -- -- -- -- 9   Watts -- -- -- -- 60   Minutes -- -- -- -- 15   METs -- -- -- -- 2.73      NuStep   Level -- 4 -- -- --   Minutes -- 15 -- -- --   METs -- 3.2 -- -- --     REL-XR   Level -- 3 3.5 -- --   Minutes -- 15 15 -- --   METs -- 2.3 2.7 -- --     Home Exercise Plan   Plans to continue exercise at -- -- -- Home (comment)  walk Home (comment)  walk   Frequency -- -- -- Add 2 additional days to program exercise sessions. Add 2 additional days to program exercise sessions.   Initial Home Exercises Provided -- -- -- 03/04/21 03/04/21    Row Name 03/21/21 0800 04/04/21 1200 04/18/21 0800         Response to Exercise   Blood Pressure (Admit) 118/70 102/60 118/68     Blood Pressure (Exit) 110/62 108/66 112/64     Heart Rate (Admit) 70 bpm 58 bpm 58 bpm     Heart Rate (Exercise) 110 bpm 107 bpm 114 bpm     Heart Rate (Exit) 79 bpm 69 bpm 8 bpm     Rating of Perceived Exertion (Exercise) '13 13 13     ' Symptoms none none none     Duration Continue with 30 min of aerobic exercise without signs/symptoms of physical distress. Continue with 30 min of aerobic exercise without signs/symptoms of physical distress. Continue with 30 min of aerobic exercise without signs/symptoms of physical distress.     Intensity THRR unchanged THRR unchanged THRR unchanged       Progression   Progression Continue to progress workloads to maintain intensity without signs/symptoms of physical distress. Continue to progress workloads to maintain intensity without signs/symptoms of physical distress. Continue to progress workloads to maintain intensity without signs/symptoms of physical distress.     Average METs 5.9 5.56 5.3       Resistance Training   Training Prescription Yes Yes Yes     Weight 6 lb 6 lb 6 lb     Reps 10-15 10-15 10-15       Interval Training   Interval Training No No No       Treadmill   MPH '3 3 3     ' Grade '10 10 10     ' Minutes '15 15 15     ' METs 7.4 7.4 7.4       Recumbant Bike   Level -- 8 9     Watts -- 62 51     Minutes -- 15 15     METs -- 3.5 3.5        REL-XR   Level 10 10 --     Minutes 15 15 --     METs -- 5.7 --       Home Exercise Plan   Plans to continue exercise at Home (comment)  walk Home (comment)  walk Home (comment)  walk     Frequency Add 2 additional days  to program exercise sessions. Add 2 additional days to program exercise sessions. Add 2 additional days to program exercise sessions.     Initial Home Exercises Provided 03/04/21 03/04/21 03/04/21              Exercise Comments:   Exercise Comments     Row Name 01/24/21 1617           Exercise Comments First full day of exercise!  Patient was oriented to gym and equipment including functions, settings, policies, and procedures.  Patient's individual exercise prescription and treatment plan were reviewed.  All starting workloads were established based on the results of the 6 minute walk test done at initial orientation visit.  The plan for exercise progression was also introduced and progression will be customized based on patient's performance and goals.                Exercise Goals and Review:   Exercise Goals     Row Name 01/13/21 1551             Exercise Goals   Increase Physical Activity Yes       Intervention Provide advice, education, support and counseling about physical activity/exercise needs.;Develop an individualized exercise prescription for aerobic and resistive training based on initial evaluation findings, risk stratification, comorbidities and participant's personal goals.       Expected Outcomes Short Term: Attend rehab on a regular basis to increase amount of physical activity.;Long Term: Add in home exercise to make exercise part of routine and to increase amount of physical activity.;Long Term: Exercising regularly at least 3-5 days a week.       Increase Strength and Stamina Yes       Intervention Provide advice, education, support and counseling about physical activity/exercise needs.;Develop an individualized exercise  prescription for aerobic and resistive training based on initial evaluation findings, risk stratification, comorbidities and participant's personal goals.       Expected Outcomes Short Term: Increase workloads from initial exercise prescription for resistance, speed, and METs.;Short Term: Perform resistance training exercises routinely during rehab and add in resistance training at home;Long Term: Improve cardiorespiratory fitness, muscular endurance and strength as measured by increased METs and functional capacity (6MWT)       Able to understand and use rate of perceived exertion (RPE) scale Yes       Intervention Provide education and explanation on how to use RPE scale       Expected Outcomes Short Term: Able to use RPE daily in rehab to express subjective intensity level;Long Term:  Able to use RPE to guide intensity level when exercising independently       Able to understand and use Dyspnea scale Yes       Intervention Provide education and explanation on how to use Dyspnea scale       Expected Outcomes Short Term: Able to use Dyspnea scale daily in rehab to express subjective sense of shortness of breath during exertion;Long Term: Able to use Dyspnea scale to guide intensity level when exercising independently       Knowledge and understanding of Target Heart Rate Range (THRR) Yes       Intervention Provide education and explanation of THRR including how the numbers were predicted and where they are located for reference       Expected Outcomes Short Term: Able to state/look up THRR;Short Term: Able to use daily as guideline for intensity in rehab;Long Term: Able to use THRR to govern intensity when  exercising independently       Able to check pulse independently Yes       Intervention Provide education and demonstration on how to check pulse in carotid and radial arteries.;Review the importance of being able to check your own pulse for safety during independent exercise       Expected Outcomes  Short Term: Able to explain why pulse checking is important during independent exercise;Long Term: Able to check pulse independently and accurately       Understanding of Exercise Prescription Yes                Exercise Goals Re-Evaluation :  Exercise Goals Re-Evaluation     Row Name 01/24/21 1617 02/07/21 1505 02/21/21 0832 03/04/21 1130 03/07/21 1021     Exercise Goal Re-Evaluation   Exercise Goals Review Increase Physical Activity;Able to understand and use rate of perceived exertion (RPE) scale;Knowledge and understanding of Target Heart Rate Range (THRR);Understanding of Exercise Prescription;Increase Strength and Stamina;Able to understand and use Dyspnea scale;Able to check pulse independently Increase Physical Activity;Increase Strength and Stamina;Understanding of Exercise Prescription Increase Physical Activity;Increase Strength and Stamina Increase Physical Activity;Able to understand and use rate of perceived exertion (RPE) scale;Knowledge and understanding of Target Heart Rate Range (THRR);Understanding of Exercise Prescription;Increase Strength and Stamina;Able to check pulse independently Increase Physical Activity;Increase Strength and Stamina   Comments Reviewed RPE and dyspnea scales, THR and program prescription with pt today.  Pt voiced understanding and was given a copy of goals to take home. Nicholas Horton is off to a good start in rehab.  He is now on level 4 on the NuStep.  We will continue to monitor his progress. Nicholas Horton is doing well and has increased speed and grade on TM.  Staff will encourage increasing weights for strength work Reviewed home exercise with pt today.  Pt plans to walk for exercise.  Reviewed THR, pulse, RPE, sign and symptoms, pulse oximetery and when to call 911 or MD.  Also discussed weather considerations and indoor options.  Pt voiced understanding. Nicholas Horton is doing well in rehab. He has built up to 6 lbs for handweights and level 9 on the recumbant bike  and reached 60 watts. Treadmill incline to up to 10%. Will continue to monitor, along with consistent attendance.   Expected Outcomes Short: Use RPE daily to regulate intensity. Long: Follow program prescription in THR. Short: Increase treadmill. Long: Continue to improve stamina Short: increase weights Long:improve average MET level Short: add 2 extra days of exercise. Long: independently exercise after graduation Short: Continue to increase handweights Long: Continue to increase overall MET level    Row Name 03/21/21 0844 04/04/21 1213 04/11/21 1113 04/18/21 0855 05/02/21 1017     Exercise Goal Re-Evaluation   Exercise Goals Review Increase Physical Activity;Increase Strength and Stamina Increase Physical Activity;Increase Strength and Stamina;Understanding of Exercise Prescription Increase Physical Activity;Increase Strength and Stamina Increase Physical Activity;Increase Strength and Stamina Increase Physical Activity;Increase Strength and Stamina   Comments Nicholas Horton continues to do well whe he attends.  He was only here once per week the past 2 weeks.  Staff wil review importance of being consistent. Nicholas Horton is doing well in rehab.  He is enjoying doing intervals and  up to 5.7 METs on the XR. We will continue to monitor his progress. Nicholas Horton is walking 2-3 days when not in HT.  He does not check HR while walking.  He does feel his RPE is abot the same as TM.  He does know how  to check HR and staf reviewed importance of making sure he works in [employer] range. Nicholas Horton continues to do well in HT and is on level 9 on the bike.  He is currently using 6 lb for strength work. Nicholas Horton has not attended rehab since 11/22  and has no progress to note on. Routine attendance would yield better results.   Expected Outcomes Short: attend 2-3 times per week Long:improve overall stamina Short: Continue to add intervals Long: Continue to improve stamina. Short: check HR while walking Long: continue to build stamina Short: try  7 lb Long:  continue to improve stamina Short: Maintain consistent attendance Long: Continue to increase overall MET level    Row Name 05/09/21 1631 06/01/21 1259           Exercise Goal Re-Evaluation   Comments Continues to be out Continues to be out               Discharge Exercise Prescription (Final Exercise Prescription Changes):  Exercise Prescription Changes - 04/18/21 0800       Response to Exercise   Blood Pressure (Admit) 118/68    Blood Pressure (Exit) 112/64    Heart Rate (Admit) 58 bpm    Heart Rate (Exercise) 114 bpm    Heart Rate (Exit) 8 bpm    Rating of Perceived Exertion (Exercise) 13    Symptoms none    Duration Continue with 30 min of aerobic exercise without signs/symptoms of physical distress.    Intensity THRR unchanged      Progression   Progression Continue to progress workloads to maintain intensity without signs/symptoms of physical distress.    Average METs 5.3      Resistance Training   Training Prescription Yes    Weight 6 lb    Reps 10-15      Interval Training   Interval Training No      Treadmill   MPH 3    Grade 10    Minutes 15    METs 7.4      Recumbant Bike   Level 9    Watts 51    Minutes 15    METs 3.5      Home Exercise Plan   Plans to continue exercise at Home (comment)   walk   Frequency Add 2 additional days to program exercise sessions.    Initial Home Exercises Provided 03/04/21             Nutrition:  Target Goals: Understanding of nutrition guidelines, daily intake of sodium <1533m, cholesterol <2072m calories 30% from fat and 7% or less from saturated fats, daily to have 5 or more servings of fruits and vegetables.  Education: All About Nutrition: -Group instruction provided by verbal, written material, interactive activities, discussions, models, and posters to present general guidelines for heart healthy nutrition including fat, fiber, MyPlate, the role of sodium in heart healthy nutrition,  utilization of the nutrition label, and utilization of this knowledge for meal planning. Follow up email sent as well. Written material given at graduation. Flowsheet [facility]  Date 02/16/21  [facility] 1- Verbalizes Understanding       Biometrics:  Pre Biometrics - 01/13/21 1551       Pre Biometrics   Height 6' 0.5" (1.842 m)    Weight 239 lb 3.2 oz (108.5 kg)    BMI (Calculated) 31.98    Single Leg Stand 30 seconds  Nutrition Therapy Plan and Nutrition Goals:  Nutrition Therapy & Goals - 03/21/21 1030       Nutrition Therapy   Diet Heart healthy, low Na, diabetes frienldy    Drug/Food Interactions Coumadin/Vit K   pt reports taking coumadin   Protein (specify units) 80g    Fiber 30 grams    Whole Grain Foods 3 servings    Saturated Fats 12 max. grams    Fruits and Vegetables 8 servings/day    Sodium 1.5 grams      Personal Nutrition Goals   Nutrition Goal ST: 2-4 CHO per meal (75g CHo is 5 servings - from recall ot seems he would like to keep it on the lower end) LT: improve energy, at least 1 whole grain per day, 8 fruits/vegetables per day, at least 2 CHO servings per meal    Comments He is on a low CHO diet - he defines as <75g/meal, from recall ot seems he would like to keep it on the lower end: He eats a lot of salads and grilled chicken - he has fried chicken sometimes. He will eat hamburgers and all beef hotdogs - rarely has them. He has red meat 3x/week. B: 8 am: cereal (captain crunch with lactose free whole milk) L: [place] sandwich with chips (Arnold keto bread - 8g fiber) S: sugar free jello or atkins protein bar with tea with splenda. D: varies: grilled chicken or friend chicken, hamburger or hamburger steak. He has his protein on the keto bread. 2-3 vegetables: peas, carrots, he doesn't like corn, salads, riced caulifower, small amount of leafy greens with  coumadin. He will sometimes use butter or olive oil. He does not salt his food. Discussed heart healthy eating and diabetes friendly eaitng.      Intervention Plan   Intervention Nutrition handout(s) given to patient.;Prescribe, educate and counsel regarding individualized specific dietary modifications aiming towards targeted core components such as weight, hypertension, lipid management, diabetes, heart failure and other comorbidities.    Expected Outcomes Short Term Goal: Understand basic principles of dietary content, such as calories, fat, sodium, cholesterol and nutrients.;Short Term Goal: A plan has been developed with personal nutrition goals set during dietitian appointment.;Long Term Goal: Adherence to prescribed nutrition plan.             Nutrition Assessments:  MEDIFICTS Score Key: ?70 Need to make dietary changes  40-70 Heart Healthy Diet ? 40 Therapeutic Level Cholesterol Diet  Flowsheet Row Cardiac Rehab from 01/13/2021 in [facility] Cardiac and Pulmonary Rehab  Picture Your Plate Total Score on Admission 62      Picture Your Plate Scores: <18 Unhealthy dietary pattern with much room for improvement. 41-50 Dietary pattern unlikely to meet recommendations for good health and room for improvement. 51-60 More healthful dietary pattern, with some room for improvement.  >60 Healthy dietary pattern, although there may be some specific behaviors that could be improved.    Nutrition Goals Re-Evaluation:  Nutrition Goals Re-Evaluation     [place] Name 02/16/21 1125 04/11/21 1116           Goals   Nutrition Goal -- Nicholas Horton has been eating smaller portions since meeting with RD.  He does feel satisfied and not hungry betwen meals.  He is working on having the recommended amoutn of carbs.      Comment Nicholas Horton has not met with RD yet. --      Expected Outcome -- Short:  continue to follow RD advice Long: maintain heart healthy diet  Nutrition Goals Discharge  (Final Nutrition Goals Re-Evaluation):  Nutrition Goals Re-Evaluation - 04/11/21 1116       Goals   Nutrition Goal Nicholas Horton has been eating smaller portions since meeting with RD.  He does feel satisfied and not hungry betwen meals.  He is working on having the recommended amoutn of carbs.    Expected Outcome Short:  continue to follow RD advice Long: maintain heart healthy diet             Psychosocial: Target Goals: Acknowledge presence or absence of significant depression and/or stress, maximize coping skills, provide positive support system. Participant is able to verbalize types and ability to use techniques and skills needed for reducing stress and depression.   Education: Stress, Anxiety, and Depression - Group verbal and visual presentation to define topics covered.  Reviews how body is impacted by stress, anxiety, and depression.  Also discusses healthy ways to reduce stress and to treat/manage anxiety and depression.  Written material given at graduation. Flowsheet Row Cardiac Rehab from 03/23/2021 in [facility] Cardiac and Pulmonary Rehab  Date 03/09/21  Educator [facility]  Instruction Review Code 1- Verbalizes Understanding       Education: Sleep Hygiene -Provides group verbal and written instruction about how sleep can affect your health.  Define sleep hygiene, discuss sleep cycles and impact of sleep habits. Review good sleep hygiene tips.    Initial Review & Psychosocial Screening:   Quality of Life Scores:   Quality of Life - 01/13/21 1554       Quality of Life   Select Quality of Life      Quality of Life Scores   Health/Function Pre 9.63 %    Socioeconomic Pre 17.43 %    Psych/Spiritual Pre 9.57 %    Family Pre 15.6 %    GLOBAL Pre 12.1 %            Scores of 19 and below usually indicate a poorer quality of life in these areas.  A difference of  2-3 points is a clinically meaningful difference.  A difference of 2-3 points in the total score of the Quality of  Life Index has been associated with significant improvement in overall quality of life, self-image, physical symptoms, and general health in studies assessing change in quality of life.  PHQ-9: Recent Review Flowsheet Data     Depression screen [facility] 2/9 02/16/2021 01/13/2021 02/18/2018 12/19/2017   Decreased Interest '1 2 2 2   ' Down, Depressed, Hopeless '1 3  2 2   ' PHQ - 2 Score '2 5 4 4   ' Altered sleeping '2 2 2 3   ' Tired, decreased energy '1 2 2 3   ' Change in appetite '2 1 3 2   ' Feeling bad or failure about yourself  '2 3 2 2   ' Trouble concentrating 1 0 2 3   Moving slowly or fidgety/restless '1 1 2 2   ' Suicidal thoughts 0 3  0 1   PHQ-9 Score '11 17 17 20   ' Difficult doing work/chores Somewhat difficult Somewhat difficult Extremely dIfficult Somewhat difficult      Interpretation of Total Score  Total Score Depression Severity:  1-4 = Minimal depression, 5-9 = Mild depression, 10-14 = Moderate depression, 15-19 = Moderately severe depression, 20-27 = Severe depression   Psychosocial Evaluation and Intervention:   Psychosocial Re-Evaluation:  Psychosocial Re-Evaluation     [place] Name 02/16/21 1119 03/21/21 1117 04/11/21 1118         Psychosocial Re-Evaluation  Current issues with Current Stress Concerns;Current Psychotropic Meds;History of Depression;Current Sleep Concerns Current Stress Concerns;History of Depression;Current Sleep Concerns Current Stress Concerns;History of Depression;Current Sleep Concerns     Comments Reviewed PHQ-9 with patient and score has improved.  He states he feels better overall.  He has trouble with sleep.  We reveiwed not having caffeine on the afternoon, as well not not eating too late, not using phone or computer right before bed.  He reports taking meds as directed. Nicholas Horton takes Wellbutrin for depression.  He reports sleeping a little better.  He can nap if he has a night he doesnt sleep well.  He is not seeing a counselor currently but pplans to check into  it. Nicholas Horton states his mind is clear.  He still is slightly depressed.  He is looking into other activties to help him get out of the house to help with mood.  He has a group he can go with.  No changes in sleep patterns     Expected Outcomes Short: work on better sleep habits Long:  be able to sleep better consistently, exercise to help with symptoms of depression and anxiety Short: continue to work on sleep patterns and exercise to help with stress/depression Long: manage stress/depression with help of meds and exercise Short: try going out with friends Long:  manage stress/depression with exercise and medication              Psychosocial Discharge (Final Psychosocial Re-Evaluation):  Psychosocial Re-Evaluation - 04/11/21 1118       Psychosocial Re-Evaluation   Current issues with Current Stress Concerns;History of Depression;Current Sleep Concerns    Comments Nicholas Horton states his mind is clear.  He still is slightly depressed.  He is looking into other activties to help him get out of the house to help with mood.  He has a group he can go with.  No changes in sleep patterns    Expected Outcomes Short: try going out with friends Long:  manage stress/depression with exercise and medication             Vocational Rehabilitation: Provide vocational rehab assistance to qualifying candidates.   Vocational Rehab Evaluation & Intervention:   Education: Education Goals: Education classes will be provided on a variety of topics geared toward better understanding of heart health and risk factor modification. Participant will state understanding/return demonstration of topics presented as noted by education test scores.  Learning Barriers/Preferences:   General Cardiac Education Topics:  AED/CPR: - Group verbal and written instruction with the use of models to demonstrate the basic use of the AED with the basic ABC's of resuscitation.   Anatomy and Cardiac Procedures: - Group verbal  and visual presentation and models provide information about basic cardiac anatomy and function. Reviews the testing methods done to diagnose heart disease and the outcomes of the test results. Describes the treatment choices: Medical Management, Angioplasty, or Coronary Bypass Surgery for treating various heart conditions including Myocardial Infarction, Angina, Valve Disease, and Cardiac Arrhythmias.  Written material given at graduation. Flowsheet Row Cardiac Rehab from 03/23/2021 in [facility] Cardiac and Pulmonary Rehab  Date 01/26/21  Educator [facility]  Instruction Review Code 1- Verbalizes Understanding       Medication Safety: - Group verbal and visual instruction to review commonly prescribed medications for heart and lung disease. Reviews the medication, class of the drug, and side effects. Includes the steps to properly store meds and maintain the prescription regimen.  Written material given at graduation.  Intimacy: - Group verbal instruction through game format to discuss how heart and lung disease can affect sexual intimacy. Written material given at graduation.. Flowsheet Row Cardiac Rehab from 03/23/2021 in [facility] Cardiac and Pulmonary Rehab  Date 03/23/21  Educator [facility]  Instruction Review Code 1- Verbalizes Understanding       Know Your Numbers and Heart Failure: - Group verbal and visual instruction to discuss disease risk factors for cardiac and pulmonary disease and treatment options.  Reviews associated critical values for Overweight/Obesity, Hypertension, Cholesterol, and Diabetes.  Discusses basics of heart failure: signs/symptoms and treatments.  Introduces Heart Failure Zone chart for action plan for heart failure.  Written material given at graduation.   Infection Prevention: - Provides verbal and written material to individual with discussion of infection control including proper hand washing and proper equipment cleaning during exercise session. Flowsheet Row Cardiac Rehab  from 03/23/2021 in [facility] Cardiac and Pulmonary Rehab  Date 01/13/21  Educator AS  Instruction Review Code 1- Verbalizes Understanding       Falls Prevention: - Provides verbal and written material to individual with discussion of falls prevention and safety. Flowsheet Row Cardiac Rehab from 03/23/2021 in [facility] Cardiac and Pulmonary Rehab  Date 01/13/21  Educator AS  Instruction Review Code 1- Verbalizes Understanding       Other: -Provides group and verbal instruction on various topics (see comments)   Knowledge Questionnaire Score:   Core Components/Risk Factors/Patient Goals at Admission:  Personal Goals and Risk Factors at Admission - 01/13/21 1559       Core Components/Risk Factors/Patient Goals on Admission    Weight Management Yes;Weight Loss    Intervention Weight Management: Develop a combined nutrition and exercise program designed to reach desired caloric intake, while maintaining appropriate intake of nutrient and fiber, sodium and fats, and appropriate energy expenditure required for the weight goal.;Weight Management: Provide education and appropriate resources to help participant work on and attain dietary goals.;Weight Management/Obesity: Establish reasonable short term and long term weight goals.    Admit Weight 239 lb 3.2 oz (108.5 kg)    Goal Weight: Short Term 225 lb (102.1 kg)    Goal Weight: Long Term 200 lb (90.7 kg)    Expected Outcomes Short Term: Continue to assess and modify interventions until short term weight is achieved;Long Term: Adherence to nutrition and physical activity/exercise program aimed toward attainment of established weight goal;Weight Loss: Understanding of general recommendations for a balanced deficit meal plan, which promotes 1-2 lb weight loss per week and includes a negative energy balance of [phone] kcal/d;Understanding of distribution of calorie intake throughout the day with the consumption of 4-5 meals/snacks;Understanding  recommendations for meals to include 15-35% energy as protein, 25-35% energy from fat, 35-60% energy from carbohydrates, less than 225m of dietary cholesterol, 20-35 gm of total fiber daily    Improve shortness of breath with ADL's Yes    Intervention Provide education, individualized exercise plan and daily activity instruction to help decrease symptoms of SOB with activities of daily living.    Expected Outcomes Short Term: Improve cardiorespiratory fitness to achieve a reduction of symptoms when performing ADLs;Long Term: Be able to perform more ADLs without symptoms or delay the onset of symptoms    Diabetes Yes    Intervention Provide education about signs/symptoms and action to take for hypo/hyperglycemia.;Provide education about proper nutrition, including hydration, and aerobic/resistive exercise prescription along with prescribed medications to achieve blood glucose in normal ranges: Fasting glucose 65-99 mg/dL    Expected Outcomes  Short Term: Participant verbalizes understanding of the signs/symptoms and immediate care of hyper/hypoglycemia, proper foot care and importance of medication, aerobic/resistive exercise and nutrition plan for blood glucose control.;Long Term: Attainment of HbA1C < 7%.    Heart Failure Yes    Intervention Provide a combined exercise and nutrition program that is supplemented with education, support and counseling about heart failure. Directed toward relieving symptoms such as shortness of breath, decreased exercise tolerance, and extremity edema.    Expected Outcomes Improve functional capacity of life;Short term: Attendance in program 2-3 days a week with increased exercise capacity. Reported lower sodium intake. Reported increased fruit and vegetable intake. Reports medication compliance.;Short term: Daily weights obtained and reported for increase. Utilizing diuretic protocols set by physician.;Long term: Adoption of self-care skills and reduction of barriers for  early signs and symptoms recognition and intervention leading to self-care maintenance.             Education:Diabetes - Individual verbal and written instruction to review signs/symptoms of diabetes, desired ranges of glucose level fasting, after meals and with exercise. Acknowledge that pre and post exercise glucose checks will be done for 3 sessions at entry of program. [place] from 03/23/2021 in [facility] Cardiac and Pulmonary Rehab  Date 01/13/21  Educator AS  Instruction Review Code 1- Verbalizes Understanding       Core Components/Risk Factors/Patient Goals Review:   Goals and Risk Factor Review     Row Name 02/16/21 1116 03/21/21 1114 04/11/21 1112         Core Components/Risk Factors/Patient Goals Review   Personal Goals Review Diabetes;Improve shortness of breath with ADL's;Weight Management/Obesity Weight Management/Obesity;Diabetes Weight Management/Obesity;Diabetes     Review Nicholas Horton's weight is steady.  He states he feels better and is getting energy back.   He checks BG at home a couple times a week. Nicholas Horton has lost 5 lb since stating the program.  He continues to check BG at home FBG has been 93-115.  he feels pretty good about overall stamina Nicholas Horton's weight is about the same.  He can tell his clothes fit better.  He continues to check BG at home.     Expected Outcomes Short: continue to check BG at home Long: manage risk factors long term Short: continue to monitor BG Long: keep risk factors at optimal levels --              Core Components/Risk Factors/Patient Goals at Discharge (Final Review):   Goals and Risk Factor Review - 04/11/21 1112       Core Components/Risk Factors/Patient Goals Review   Personal Goals Review Weight Management/Obesity;Diabetes    Review Nicholas Horton's weight is about the same.  He can tell his clothes fit better.  He continues to check BG at home.             ITP Comments:  ITP Comments     Row Name  01/13/21 1605 01/24/21 1617 02/02/21 0616 02/07/21 1503 03/02/21 0858   ITP Comments Completed 6MWT and gym orientation. Initial ITP created and sent for review to Dr. Emily Filbert, Medical Director. First full day of exercise!  Patient was oriented to gym and equipment including functions, settings, policies, and procedures.  Patient's individual exercise prescription and treatment plan were reviewed.  All starting workloads were established based on the results of the 6 minute walk test done at initial orientation visit.  The plan for exercise progression was also introduced and progression will be customized based on patient's performance  and goals. 30 Day review completed. Medical Director ITP review done, changes made as directed, and signed approval by Medical Director. Nicholas Horton was told to stay home today as he had been having cold like symptoms.  He was also encouraged to get tested for COVID. 30 day review completed. ITP sent to Dr. Emily Filbert, Medical Director of Cardiac Rehab. Continue with ITP unless changes are made by physician.    [place] Name 03/21/21 1157 03/30/21 0659 04/27/21 0645 05/02/21 1016 05/09/21 1631   ITP Comments Completed initial RD consultation 30 Day review completed. Medical Director ITP review done, changes made as directed, and signed approval by Medical Director. 30 Day review completed. Medical Director ITP review done, changes made as directed, and signed approval by Medical Director. Patient has not been to rehab since 11/22. Has been out due to having a sick child. Called to get update from patient and left message. Called to check on pt. Last attended on 04/19/21.  Last call 12/1 and had said he had a sick kid and planned to return on 12/2 but did not attend.  Today, left message. We will send letter at end of week if no return contact.    Nicholas Horton Name 05/25/21 0704 05/25/21 1233 06/01/21 1259 06/21/21 0831     ITP Comments 30 Day review completed. Medical Director ITP review  done, changes made as directed, and signed approval by Medical Director.    No visits in Dec Nicholas Horton call to let us know that he was out of town last week and will be out again this week.  He currently has the flu A.  We told him that once his symptoms subside and fever free for 48 hours, then he can return to rehab. Nicholas Horton contineus to be out Nicholas Horton has not returned to rehab and has stopped responding to calls and contact attempts.             Comments: Discharge ITP

## 2021-06-21 NOTE — Progress Notes (Signed)
Discharge Progress Report  Patient Details  Name: Nicholas Horton MRN: [id number] Date of Birth: March 01, 1976 Referring Provider:   Flowsheet Row Cardiac Rehab from 01/13/2021 in [facility] Cardiac and Pulmonary Rehab  Referring Provider Newman Pies        Number of Visits: 25  Reason for Discharge:  Early Exit:  Lack of attendance  Smoking History:  Social History   Tobacco Use  Smoking Status Former   Packs/day: 2.00   Years: 20.00   Pack years: 40.00   Types: Cigarettes  Smokeless Tobacco Former  Tobacco Comments   quit 10/04/14    Diagnosis:  Heart failure, chronic systolic ([place])   Initial Exercise Prescription:  Initial Exercise Prescription - 01/13/21 1500       Date of Initial Exercise RX and Referring Provider   Date 01/13/21    Referring Provider Newman Pies      Treadmill   MPH 2.2    Grade 1.5    Minutes 15    METs 3.2      REL-XR   Level 3    Speed 50    Minutes 15    METs 3.2      T5 Nustep   Level 2    SPM 80    Minutes 15    METs 3.2      Prescription Details   Frequency (times per week) 3    Duration Progress to 30 minutes of continuous aerobic without signs/symptoms of physical distress      Intensity   THRR 40-80% of Max Heartrate 116-156    Ratings of Perceived Exertion 11-13    Perceived Dyspnea 0-4      Resistance Training   Training Prescription Yes    Weight 3 lb    Reps 10-15             Discharge Exercise Prescription (Final Exercise Prescription Changes):  Exercise Prescription Changes - 04/18/21 0800       Response to Exercise   Blood Pressure (Admit) 118/68    Blood Pressure (Exit) 112/64    Heart Rate (Admit) 58 bpm    Heart Rate (Exercise) 114 bpm    Heart Rate (Exit) 8 bpm    Rating of Perceived Exertion (Exercise) 13    Symptoms none    Duration Continue with 30 min of aerobic exercise without signs/symptoms of physical distress.    Intensity THRR unchanged      Progression   Progression Continue to progress  workloads to maintain intensity without signs/symptoms of physical distress.    Average METs 5.3      Resistance Training   Training Prescription Yes    Weight 6 lb    Reps 10-15      Interval Training   Interval Training No      Treadmill   MPH 3    Grade 10    Minutes 15    METs 7.4      Recumbant Bike   Level 9    Watts 51    Minutes 15    METs 3.5      Home Exercise Plan   Plans to continue exercise at Home (comment)   walk   Frequency Add 2 additional days to program exercise sessions.    Initial Home Exercises Provided 03/04/21             Functional Capacity:  6 Minute Walk     Row Name 01/13/21 1544         6 Minute  Walk   Phase Initial     Distance 1180 feet     Walk Time 6 minutes     # of Rest Breaks 0     MPH 2.2     METS 3.68     RPE 9     Perceived Dyspnea  1     VO2 Peak 12.9     Symptoms No     Resting HR 77 bpm     Resting BP 102/58     Resting Oxygen Saturation  94 %     Exercise Oxygen Saturation  during 6 min walk 94 %     Max Ex. HR 88 bpm     Max Ex. BP 118/72     2 Minute Post BP 102/66              Psychological, QOL, Others - Outcomes: PHQ 2/9: Depression screen [facility] 2/9 02/16/2021 01/13/2021 02/18/2018 12/19/2017  Decreased Interest 1 2 2 2   Down, Depressed, Hopeless 1 3 2 2   PHQ - 2 Score 2 5 4 4   Altered sleeping 2 2 2 3   Tired, decreased energy 1 2 2 3   Change in appetite 2 1 3 2   Feeling bad or failure about yourself  2 3 2 2   Trouble concentrating 1 0 2 3  Moving slowly or fidgety/restless 1 1 2 2   Suicidal thoughts 0 3 0 1  PHQ-9 Score 11 17 17 20   Difficult doing work/chores Somewhat difficult Somewhat difficult Extremely dIfficult Somewhat difficult      Nutrition & Weight - Outcomes:  Pre Biometrics - 01/13/21 1551       Pre Biometrics   Height 6' 0.5" (1.842 m)    Weight 239 lb 3.2 oz (108.5 kg)    BMI (Calculated) 31.98    Single Leg Stand 30 seconds              Nutrition:   Nutrition Therapy & Goals - 03/21/21 1030       Nutrition Therapy   Diet Heart healthy, low Na, diabetes frienldy    Drug/Food Interactions Coumadin/Vit K   pt reports taking coumadin   Protein (specify units) 80g    Fiber 30 grams    Whole Grain Foods 3 servings    Saturated Fats 12 max. grams    Fruits and Vegetables 8 servings/day    Sodium 1.5 grams      Personal Nutrition Goals   Nutrition Goal ST: 2-4 CHO per meal (75g CHo is 5 servings - from recall ot seems he would like to keep it on the lower end) LT: improve energy, at least 1 whole grain per day, 8 fruits/vegetables per day, at least 2 CHO servings per meal    Comments He is on a low CHO diet - he defines as <75g/meal, from recall ot seems he would like to keep it on the lower end: He eats a lot of salads and grilled chicken - he has fried chicken sometimes. He will eat hamburgers and all beef hotdogs - rarely has them. He has red meat 3x/week. B: 8 am: cereal (captain crunch with lactose free whole milk) L: [place] sandwich with chips (Arnold keto bread - 8g fiber) S: sugar free jello or atkins protein bar with tea with splenda. D: varies: grilled chicken or friend chicken, hamburger or hamburger steak. He has his protein on the keto bread. 2-3 vegetables: peas, carrots, he doesn't like corn, salads, riced caulifower, small amount of leafy  greens with coumadin. He will sometimes use butter or olive oil. He does not salt his food. Discussed heart healthy eating and diabetes friendly eaitng.      Intervention Plan   Intervention Nutrition handout(s) given to patient.;Prescribe, educate and counsel regarding individualized specific dietary modifications aiming towards targeted core components such as weight, hypertension, lipid management, diabetes, heart failure and other comorbidities.    Expected Outcomes Short Term Goal: Understand basic principles of dietary content, such as calories, fat, sodium, cholesterol and nutrients.;Short  Term Goal: A plan has been developed with personal nutrition goals set during dietitian appointment.;Long Term Goal: Adherence to prescribed nutrition plan.           Goals reviewed with patient; copy given to patient.

## 2021-07-09 ENCOUNTER — Emergency Department
Admission: EM | Admit: 2021-07-09 | Discharge: 2021-07-09 | Disposition: A | Discharge status: Home / Self Care | Payer: Medicare HMO | Attending: Emergency Medicine | Admitting: Emergency Medicine

## 2021-07-09 ENCOUNTER — Emergency Department: Payer: Medicare HMO

## 2021-07-09 DIAGNOSIS — D72829 Elevated white blood cell count, unspecified: Secondary | ICD-10-CM | POA: Diagnosis not present

## 2021-07-09 DIAGNOSIS — M79662 Pain in left lower leg: Secondary | ICD-10-CM

## 2021-07-09 DIAGNOSIS — R791 Abnormal coagulation profile: Secondary | ICD-10-CM | POA: Insufficient documentation

## 2021-07-09 DIAGNOSIS — R52 Pain, unspecified: Secondary | ICD-10-CM

## 2021-07-09 DIAGNOSIS — L03116 Cellulitis of left lower limb: Secondary | ICD-10-CM | POA: Insufficient documentation

## 2021-07-09 LAB — CBC WITH DIFFERENTIAL/PLATELET
Abs Immature Granulocytes: 0.12 10*3/uL — ABNORMAL HIGH (ref 0.00–0.07)
Basophils Absolute: 0.1 10*3/uL (ref 0.0–0.1)
Basophils Relative: 0 %
Eosinophils Absolute: 0.1 10*3/uL (ref 0.0–0.5)
Eosinophils Relative: 0 %
HCT: 42.9 % (ref 39.0–52.0)
Hemoglobin: 14.1 g/dL (ref 13.0–17.0)
Immature Granulocytes: 1 %
Lymphocytes Relative: 9 %
Lymphs Abs: 1.1 10*3/uL (ref 0.7–4.0)
MCH: 31.5 pg (ref 26.0–34.0)
MCHC: 32.9 g/dL (ref 30.0–36.0)
MCV: 96 fL (ref 80.0–100.0)
Monocytes Absolute: 0.6 10*3/uL (ref 0.1–1.0)
Monocytes Relative: 4 %
Neutro Abs: 10.7 10*3/uL — ABNORMAL HIGH (ref 1.7–7.7)
Neutrophils Relative %: 86 %
Platelets: 158 10*3/uL (ref 150–400)
RBC: 4.47 MIL/uL (ref 4.22–5.81)
RDW: 13.4 % (ref 11.5–15.5)
WBC: 12.6 10*3/uL — ABNORMAL HIGH (ref 4.0–10.5)
nRBC: 0 % (ref 0.0–0.2)

## 2021-07-09 LAB — COMPREHENSIVE METABOLIC PANEL
ALT: 23 U/L (ref 0–44)
AST: 24 U/L (ref 15–41)
Albumin: 4.5 g/dL (ref 3.5–5.0)
Alkaline Phosphatase: 60 U/L (ref 38–126)
Anion gap: 8 (ref 5–15)
BUN: 13 mg/dL (ref 6–20)
CO2: 26 mmol/L (ref 22–32)
Calcium: 9.3 mg/dL (ref 8.9–10.3)
Chloride: 103 mmol/L (ref 98–111)
Creatinine, Ser: 0.96 mg/dL (ref 0.61–1.24)
GFR, Estimated: >60 mL/min (ref >60)
Glucose, Bld: 140 mg/dL — ABNORMAL HIGH (ref 70–99)
Potassium: 4.3 mmol/L (ref 3.5–5.1)
Sodium: 137 mmol/L (ref 135–145)
Total Bilirubin: 1.1 mg/dL (ref 0.3–1.2)
Total Protein: 7.5 g/dL (ref 6.5–8.1)

## 2021-07-09 LAB — URIC ACID: Uric Acid, Serum: 5.2 mg/dL (ref 3.7–8.6)

## 2021-07-09 LAB — PROTIME-INR
INR: 1.1 (ref 0.8–1.2)
Prothrombin Time: 13.8 seconds (ref 11.4–15.2)

## 2021-07-09 MED ORDER — CEPHALEXIN 500 MG PO CAPS
500.0000 mg | ORAL_CAPSULE | Freq: Once | ORAL | Status: AC
  Administered 2021-07-09: 500 mg via ORAL
  Filled 2021-07-09: qty 1

## 2021-07-09 MED ORDER — DOXYCYCLINE MONOHYDRATE 100 MG PO TABS: 100.0000 mg | ORAL_TABLET | Freq: Two times a day (BID) | ORAL | 0 refills | Status: AC

## 2021-07-09 MED ORDER — CEPHALEXIN 500 MG PO CAPS: 500.0000 mg | ORAL_CAPSULE | Freq: Four times a day (QID) | ORAL | 0 refills | Status: AC

## 2021-07-09 MED ORDER — DOXYCYCLINE HYCLATE 100 MG PO TABS
100.0000 mg | ORAL_TABLET | Freq: Once | ORAL | Status: AC
  Administered 2021-07-09: 100 mg via ORAL
  Filled 2021-07-09: qty 1

## 2021-07-09 NOTE — ED Triage Notes (Signed)
Pt states that his left ankle started hurting yesterday and this morning it became increasingly painful. Pt is ambulatory and does not recall an injury to the ankle.  Pt states that he was talking with his family and then just fell asleep. Pt states he slept most of the day and should not be tired. This has been going on and off for a few weeks.

## 2021-07-09 NOTE — Discharge Instructions (Addendum)
Take 2 Keflex in the morning and 2 Keflex at night. Take 1 doxycycline in the morning and 1 at night. Please contact your primary care provider as INR is subtherapeutic.

## 2021-07-09 NOTE — ED Provider Notes (Signed)
[facility] Provider Note  Patient Contact: 9:51 PM (approximate)   History   Ankle Pain   HPI  Nicholas Horton is a 46 y.o. male presents to the emergency department with left ankle/calf pain and swelling for the past 2 days.  Patient denies a history of gout but states that he has been on a recent keto diet and has had a diet heavier in red meat/chicken than usual.  Patient is anticoagulated with Coumadin and states that his dosing was subtherapeutic when he was recently sick with a viral URI.  He denies daily smoking.  No pleuritic chest pain or shortness of breath.  No new paroxysmal nocturnal dyspnea.  No current chest pain.      Physical Exam   Triage Vital Signs: ED Triage Vitals  Enc Vitals Group     BP 07/09/21 2003 137/83     Pulse Rate 07/09/21 2003 91     Resp 07/09/21 2003 17     Temp 07/09/21 2003 98.1 F (36.7 C)     Temp Source 07/09/21 2003 Oral     SpO2 07/09/21 2003 98 %     Weight 07/09/21 2002 243 lb (110.2 kg)     Height 07/09/21 2002 6\' 2"  (1.88 m)     Head Circumference --      Peak Flow --      Pain Score --      Pain Loc --      Pain Edu? --      Excl. in GC? --     Most recent vital signs: Vitals:   07/09/21 2003 07/09/21 2325  BP: 137/83 118/74  Pulse: 91 77  Resp: 17 20  Temp: 98.1 F (36.7 C)   SpO2: 98% 94%     General: Alert and in no acute distress. Eyes:  PERRL. EOMI. Head: No acute traumatic findings ENT:      Ears:       Nose: No congestion/rhinnorhea.      Mouth/Throat: Mucous membranes are moist.  Neck: No stridor. No cervical spine tenderness to palpation. Cardiovascular:  Good peripheral perfusion Respiratory: Normal respiratory effort without tachypnea or retractions. Lungs CTAB. Good air entry to the bases with no decreased or absent breath sounds. Gastrointestinal: Bowel sounds 4 quadrants. Soft and nontender to palpation. No guarding or rigidity. No palpable masses. No distention. No  CVA tenderness. Musculoskeletal: Full range of motion to all extremities.  Neurologic:  No gross focal neurologic deficits are appreciated.  Skin: Patient has 2+ pitting edema on the left.  Patient has erythema of the skin overlying the left ankle. Other:   ED Results / Procedures / Treatments   Labs (all labs ordered are listed, but only abnormal results are displayed) Labs Reviewed  CBC WITH DIFFERENTIAL/PLATELET - Abnormal; Notable for the following components:      Result Value   WBC 12.6 (*)    Neutro Abs 10.7 (*)    Abs Immature Granulocytes 0.12 (*)    All other components within normal limits  COMPREHENSIVE METABOLIC PANEL - Abnormal; Notable for the following components:   Glucose, Bld 140 (*)    All other components within normal limits  PROTIME-INR  URIC ACID      RADIOLOGY  I personally viewed and evaluated these images as part of my medical decision making, as well as reviewing the written report by the radiologist.  ED Provider Interpretation: I personally reviewed venous ultrasound there is no evidence of DVT.  I personally reviewed x-ray of the left ankle and no acute bony abnormality was visualized.  PROCEDURES:  Critical Care performed: No  Procedures   MEDICATIONS ORDERED IN ED: Medications  cephALEXin (KEFLEX) capsule 500 mg (500 mg Oral Given 07/09/21 2324)  doxycycline (VIBRA-TABS) tablet 100 mg (100 mg Oral Given 07/09/21 2324)     IMPRESSION / MDM / ASSESSMENT AND PLAN / ED COURSE  I reviewed the triage vital signs and the nursing notes.                             Differential diagnosis includes gout, DVT, cellulitis, ankle sprain...  Assessment and plan 46 year old male presents to the emergency department with left ankle/calf pain.  Vital signs were reassuring at triage.  On physical exam, patient had tenderness and edema along the left ankle and the left calf with mild overlying erythema.  PT/INR was subtherapeutic.  Venous  ultrasound showed no signs of DVT.  White blood cell count was elevated with left shift.  Uric acid within range.  No acute bony abnormality was visualized on x-ray of the left ankle.  We will treat patient for cellulitis with doxycycline and Keflex empirically for cellulitis.  Recommended that patient follow-up with his primary care provider as PT/INR is subtherapeutic as a goal is between 2 and 3.   FINAL CLINICAL IMPRESSION(S) / ED DIAGNOSES   Final diagnoses:  Pain  Pain of left lower leg  Cellulitis of left lower extremity     Rx / DC Orders   ED Discharge Orders          Ordered    doxycycline (ADOXA) 100 MG tablet  2 times daily        07/09/21 2317    cephALEXin (KEFLEX) 500 MG capsule  4 times daily        07/09/21 2317             Note:  This document was prepared using Dragon voice recognition software and may include unintentional dictation errors.   Vallarie Mare Hunt, PA-C 07/10/21 0009    Blake Divine, MD 07/10/21 [phone]

## 2021-07-09 NOTE — ED Notes (Signed)
NAD noted at time of D/C. Pt denies questions or concerns. Pt ambulatory to the lobby at this time. Pt refused wheelchair to the lobby. Verbal consent for D/C obtained.

## 2021-07-09 NOTE — ED Notes (Addendum)
Patient states that his left ankle started hurting yesterday. Denies injury to same. Patient does take blood thinners s/p pacemaker placement. Denies any alcohol or drug use. Patient states this has never happened before. Patient is able to ambulate on the affected limb without assistance but it just hurts. Patient states he drove himself to the hospital.

## 2021-11-15 ENCOUNTER — Encounter: Payer: Self-pay | Admitting: Emergency Medicine

## 2021-11-15 ENCOUNTER — Other Ambulatory Visit: Payer: Self-pay

## 2021-11-15 ENCOUNTER — Emergency Department
Admission: EM | Admit: 2021-11-15 | Discharge: 2021-11-15 | Disposition: A | Discharge status: Home / Self Care | Payer: Medicare Other | Attending: Emergency Medicine | Admitting: Emergency Medicine

## 2021-11-15 DIAGNOSIS — I509 Heart failure, unspecified: Secondary | ICD-10-CM | POA: Insufficient documentation

## 2021-11-15 DIAGNOSIS — R791 Abnormal coagulation profile: Secondary | ICD-10-CM | POA: Diagnosis not present

## 2021-11-15 LAB — CBC WITH DIFFERENTIAL/PLATELET
Abs Immature Granulocytes: 0.08 10*3/uL — ABNORMAL HIGH (ref 0.00–0.07)
Basophils Absolute: 0 10*3/uL (ref 0.0–0.1)
Basophils Relative: 1 %
Eosinophils Absolute: 0.1 10*3/uL (ref 0.0–0.5)
Eosinophils Relative: 1 %
HCT: 45.7 % (ref 39.0–52.0)
Hemoglobin: 14.8 g/dL (ref 13.0–17.0)
Immature Granulocytes: 1 %
Lymphocytes Relative: 15 %
Lymphs Abs: 1.3 10*3/uL (ref 0.7–4.0)
MCH: 30.2 pg (ref 26.0–34.0)
MCHC: 32.4 g/dL (ref 30.0–36.0)
MCV: 93.3 fL (ref 80.0–100.0)
Monocytes Absolute: 0.5 10*3/uL (ref 0.1–1.0)
Monocytes Relative: 6 %
Neutro Abs: 6.7 10*3/uL (ref 1.7–7.7)
Neutrophils Relative %: 76 %
Platelets: 157 10*3/uL (ref 150–400)
RBC: 4.9 MIL/uL (ref 4.22–5.81)
RDW: 13.8 % (ref 11.5–15.5)
WBC: 8.7 10*3/uL (ref 4.0–10.5)
nRBC: 0 % (ref 0.0–0.2)

## 2021-11-15 LAB — COMPREHENSIVE METABOLIC PANEL
ALT: 24 U/L (ref 0–44)
AST: 25 U/L (ref 15–41)
Albumin: 4.4 g/dL (ref 3.5–5.0)
Alkaline Phosphatase: 76 U/L (ref 38–126)
Anion gap: 12 (ref 5–15)
BUN: 16 mg/dL (ref 6–20)
CO2: 26 mmol/L (ref 22–32)
Calcium: 9.6 mg/dL (ref 8.9–10.3)
Chloride: 103 mmol/L (ref 98–111)
Creatinine, Ser: 1.02 mg/dL (ref 0.61–1.24)
GFR, Estimated: >60 mL/min (ref >60)
Glucose, Bld: 193 mg/dL — ABNORMAL HIGH (ref 70–99)
Potassium: 3.6 mmol/L (ref 3.5–5.1)
Sodium: 141 mmol/L (ref 135–145)
Total Bilirubin: 1.1 mg/dL (ref 0.3–1.2)
Total Protein: 7.6 g/dL (ref 6.5–8.1)

## 2021-11-15 LAB — PROTIME-INR
INR: 5.2 (ref 0.8–1.2)
Prothrombin Time: 47.7 seconds — ABNORMAL HIGH (ref 11.4–15.2)

## 2021-11-15 NOTE — ED Provider Notes (Signed)
   [facility] Provider Note    Event Date/Time   First MD Initiated Contact with Patient 11/15/21 1825     (approximate)  History   Chief Complaint: Abnormal Lab  HPI  Nicholas Horton is a 46 y.o. male with a past medical history of CHF, aortic valve replacement on Coumadin presents to the emergency department for an elevated INR.  According to the patient his home fingerstick INR test read greater than 8, he called his doctor who referred him to the emergency department.  Patient denies any bleeding denies any black or bloody stool.  Patient states his goal INR is 2.0-3.0.  Physical Exam   Triage Vital Signs: ED Triage Vitals  Enc Vitals Group     BP 11/15/21 1705 115/78     Pulse Rate 11/15/21 1705 71     Resp 11/15/21 1705 16     Temp 11/15/21 1705 97.8 F (36.6 C)     Temp Source 11/15/21 1705 Oral     SpO2 11/15/21 1705 94 %     Weight 11/15/21 1707 242 lb 8.1 oz (110 kg)     Height 11/15/21 1707 6\' 2"  (1.88 m)     Head Circumference --      Peak Flow --      Pain Score 11/15/21 1706 0     Pain Loc --      Pain Edu? --      Excl. in GC? --     Most recent vital signs: Vitals:   11/15/21 1705  BP: 115/78  Pulse: 71  Resp: 16  Temp: 97.8 F (36.6 C)  SpO2: 94%    General: Awake, no distress.  CV:  Good peripheral perfusion.  Regular rate and rhythm  Resp:  Normal effort.  Equal breath sounds bilaterally.  Abd:  No distention.  Soft, nontender.  No rebound or guarding.   ED Results / Procedures / Treatments   MEDICATIONS ORDERED IN ED: Medications - No data to display   IMPRESSION / MDM / ASSESSMENT AND PLAN / ED COURSE  I reviewed the triage vital signs and the nursing notes.  Patient's presentation is most consistent with acute presentation with potential threat to life or bodily function.  Patient presents emergency department for an elevated INR.  Overall the patient appears well.  Patient's took his Coumadin this morning  as normal.  Patient denies any bleeding.  Patient denies any new medications antibiotics or dietary changes.  As the patient appears well with a reassuring physical exam reassuring labs including a normal CBC, normal chemistry, INR 5.2.  We will have the patient hold his Coumadin for the next 3 days.  Patient has the ability to check his fingerstick INR at home.  We will have the patient recheck Friday morning if below 3 patient will dose his normal Coumadin, if above 3 he will wait until Saturday morning to restart his Coumadin.  Patient will follow-up with his doctor either Friday or Monday for INR recheck as well.  Patient agreeable to plan of care.  Discussed return precautions for any bleeding, head injuries or significant INR abnormalities at home.  FINAL CLINICAL IMPRESSION(S) / ED DIAGNOSES   Supratherapeutic INR   Note:  This document was prepared using Dragon voice recognition software and may include unintentional dictation errors.   Wednesday, MD 11/15/21 1904

## 2021-11-15 NOTE — Discharge Instructions (Signed)
As we discussed please recheck your INR level at home Friday morning.  If your level is below 3.0 please take your Coumadin as typically prescribed by your doctor.  If the level is above 3.0 then wait till Saturday to restart your Coumadin.  Please follow-up with your doctor either Friday or Monday for INR recheck.  Return to the emergency department for any bleeding, black or bloody stool, head injury or any significant INR abnormality on home test.

## 2021-11-15 NOTE — ED Triage Notes (Signed)
Pt sent from cardiology for INR draw. Pt had fingerstick today that read high, sent to verify. Denies any complaints. Pt on warfarin due to valve replacement.

## 2021-11-15 NOTE — ED Provider Triage Note (Signed)
Emergency Medicine Provider Triage Evaluation Note  Nicholas Horton , a 46 y.o. male  was evaluated in triage.  Pt complains of elevated INR.  His physician told him to come to the ED to have it rechecked.  Patient takes warfarin daily.  Review of Systems  Positive: Elevated lab value Negative: Bruising, bleeding  Physical Exam  BP 115/78 (BP Location: Left Arm)   Pulse 71   Temp 97.8 F (36.6 C) (Oral)   Resp 16   SpO2 94%  Gen:   Awake, no distress   Resp:  Normal effort  MSK:   Moves extremities without difficulty  Other:    Medical Decision Making  Medically screening exam initiated at 5:06 PM.  Appropriate orders placed.  Casimer Bilis was informed that the remainder of the evaluation will be completed by another provider, this initial triage assessment does not replace that evaluation, and the importance of remaining in the ED until their evaluation is complete.  Patient has has not had any bleeding from his gums or bruising.  With the elevated INR will do repeat INR along with baseline   Faythe Ghee, PA-C 11/15/21 1707

## 2022-03-30 ENCOUNTER — Encounter: Payer: Self-pay | Admitting: Emergency Medicine

## 2022-03-30 ENCOUNTER — Other Ambulatory Visit: Payer: Self-pay

## 2022-03-30 ENCOUNTER — Emergency Department
Admission: EM | Admit: 2022-03-30 | Discharge: 2022-03-30 | Disposition: A | Discharge status: Home / Self Care | Payer: Medicare Other | Attending: Emergency Medicine | Admitting: Emergency Medicine

## 2022-03-30 DIAGNOSIS — R791 Abnormal coagulation profile: Secondary | ICD-10-CM | POA: Diagnosis present

## 2022-03-30 DIAGNOSIS — Z7901 Long term (current) use of anticoagulants: Secondary | ICD-10-CM | POA: Insufficient documentation

## 2022-03-30 DIAGNOSIS — I509 Heart failure, unspecified: Secondary | ICD-10-CM | POA: Diagnosis not present

## 2022-03-30 LAB — BASIC METABOLIC PANEL
Anion gap: 10 (ref 5–15)
BUN: 13 mg/dL (ref 6–20)
CO2: 23 mmol/L (ref 22–32)
Calcium: 9.5 mg/dL (ref 8.9–10.3)
Chloride: 107 mmol/L (ref 98–111)
Creatinine, Ser: 0.93 mg/dL (ref 0.61–1.24)
GFR, Estimated: >60 mL/min (ref >60)
Glucose, Bld: 135 mg/dL — ABNORMAL HIGH (ref 70–99)
Potassium: 3.8 mmol/L (ref 3.5–5.1)
Sodium: 140 mmol/L (ref 135–145)

## 2022-03-30 LAB — CBC
HCT: 47.5 % (ref 39.0–52.0)
Hemoglobin: 15.5 g/dL (ref 13.0–17.0)
MCH: 29.6 pg (ref 26.0–34.0)
MCHC: 32.6 g/dL (ref 30.0–36.0)
MCV: 90.8 fL (ref 80.0–100.0)
Platelets: 167 10*3/uL (ref 150–400)
RBC: 5.23 MIL/uL (ref 4.22–5.81)
RDW: 14.6 % (ref 11.5–15.5)
WBC: 11.3 10*3/uL — ABNORMAL HIGH (ref 4.0–10.5)
nRBC: 0 % (ref 0.0–0.2)

## 2022-03-30 LAB — APTT: aPTT: 58 seconds — ABNORMAL HIGH (ref 24–36)

## 2022-03-30 LAB — PROTIME-INR
INR: 5.7 (ref 0.8–1.2)
Prothrombin Time: 58.4 seconds — ABNORMAL HIGH (ref 11.4–15.2)

## 2022-03-30 NOTE — Discharge Instructions (Signed)
You have been seen in the emergency department for an elevated INR.  As we discussed please do not take your Coumadin tomorrow or Saturday.  Please restart your normal Coumadin dose Sunday morning and follow-up with your doctor Monday morning for an INR recheck.  Return to the emergency department for any bleeding, black or bloody stool, or any other symptom personally concerning to yourself.

## 2022-03-30 NOTE — ED Triage Notes (Signed)
Pt to ED from home c/o abnormal labs.  States takes Warfarin for mechanical heart valve and pacemaker, checks INR at home and reports to cardiologist who wanted patient to come in for repeat labs of home INR being greater than 8, INR 9.1 at office.  Denies pain, n/v/d, or worsening SOB, A&Ox4, chest rise even and unlabored, skin WNL and in NAD at this time.

## 2022-03-30 NOTE — ED Provider Notes (Signed)
   [facility] Provider Note    Event Date/Time   First MD Initiated Contact with Patient 03/30/22 2015     (approximate)  History   Chief Complaint: Abnormal Labs  HPI  Nicholas Horton is a 46 y.o. male with a past medical history of CHF, mechanical aortic valve on Coumadin who presents to the emergency department for supratherapeutic INR.  According to the patient he checks his INR at home and was reading greater than 8 he went to the office and checked in the office and it was reading 9.1 they asked the patient come to the emergency department for evaluation.  Here the patient appears well, he denies any real symptoms denies any black or bloody stool no bleeding.  Patient has had a mechanical valve since 2016.  Physical Exam   Triage Vital Signs: ED Triage Vitals [03/30/22 1908]  Enc Vitals Group     BP (!) 134/90     Pulse Rate 90     Resp 16     Temp 98.1 F (36.7 C)     Temp Source Oral     SpO2 95 %     Weight 262 lb (118.8 kg)     Height 6\' 2"  (1.88 m)     Head Circumference      Peak Flow      Pain Score 0     Pain Loc      Pain Edu?      Excl. in [place]?     Most recent vital signs: Vitals:   03/30/22 1908  BP: (!) 134/90  Pulse: 90  Resp: 16  Temp: 98.1 F (36.7 C)  SpO2: 95%    General: Awake, no distress.  CV:  Good peripheral perfusion.  Regular rate and rhythm, click consistent with mechanical valve. Resp:  Normal effort.  Equal breath sounds bilaterally.  Abd:  No distention.  Soft, nontender.  No rebound or guarding.  ED Results / Procedures / Treatments   MEDICATIONS ORDERED IN ED: Medications - No data to display   IMPRESSION / MDM / [place] / ED COURSE  I reviewed the triage vital signs and the nursing notes.  Patient's presentation is most consistent with acute presentation with potential threat to life or bodily function.  Patient presents emergency department for an elevated INR.  Patient denies any  bleeding.  Overall the patient appears well, no complaints.  Patient's lab work in the emergency department is reassuring with a normal CBC with a normal H&H, reassuring chemistry patient's INR is 5.7.  Given the patient's INR elevated at 5.7 I do not believe vitamin K is warranted as the patient has no bleeding symptoms.  I discussed with this patient as he already took his warfarin this morning to not take it tomorrow which is Friday or Saturday have the patient restart his Coumadin on Sunday and follow-up with his doctor Monday morning for an INR recheck.  Patient agreeable to plan of care.  Provided my typical bleeding return precautions.  FINAL CLINICAL IMPRESSION(S) / ED DIAGNOSES   Supratherapeutic INR    Note:  This document was prepared using Dragon voice recognition software and may include unintentional dictation errors.   Harvest Dark, MD 03/30/22 2032

## 2022-03-30 NOTE — ED Notes (Signed)
Lab reports INR results; acuity level changed; will take pt to next available exam room

## 2022-04-18 ENCOUNTER — Emergency Department: Payer: Medicare Other

## 2022-04-18 ENCOUNTER — Emergency Department
Admission: EM | Admit: 2022-04-18 | Discharge: 2022-04-18 | Disposition: A | Discharge status: Home / Self Care | Payer: Medicare Other | Attending: Emergency Medicine | Admitting: Emergency Medicine

## 2022-04-18 DIAGNOSIS — Z1152 Encounter for screening for COVID-19: Secondary | ICD-10-CM | POA: Diagnosis not present

## 2022-04-18 DIAGNOSIS — R4182 Altered mental status, unspecified: Secondary | ICD-10-CM | POA: Diagnosis present

## 2022-04-18 DIAGNOSIS — I509 Heart failure, unspecified: Secondary | ICD-10-CM | POA: Diagnosis not present

## 2022-04-18 DIAGNOSIS — R791 Abnormal coagulation profile: Secondary | ICD-10-CM | POA: Diagnosis not present

## 2022-04-18 LAB — AMMONIA: Ammonia: 18 umol/L (ref 9–35)

## 2022-04-18 LAB — URINALYSIS, ROUTINE W REFLEX MICROSCOPIC
Bacteria, UA: NONE SEEN
Bilirubin Urine: NEGATIVE
Glucose, UA: >=500 mg/dL — AB
Hgb urine dipstick: NEGATIVE
Ketones, ur: NEGATIVE mg/dL
Leukocytes,Ua: NEGATIVE
Nitrite: NEGATIVE
Protein, ur: NEGATIVE mg/dL
Specific Gravity, Urine: 1.02 (ref 1.005–1.030)
Squamous Epithelial / HPF: NONE SEEN (ref 0–5)
pH: 5 (ref 5.0–8.0)

## 2022-04-18 LAB — URINE DRUG SCREEN, QUALITATIVE (ARMC ONLY)
Amphetamines, Ur Screen: NOT DETECTED
Barbiturates, Ur Screen: NOT DETECTED
Benzodiazepine, Ur Scrn: NOT DETECTED
Cannabinoid 50 Ng, Ur ~~LOC~~: NOT DETECTED
Cocaine Metabolite,Ur ~~LOC~~: NOT DETECTED
MDMA (Ecstasy)Ur Screen: NOT DETECTED
Methadone Scn, Ur: NOT DETECTED
Opiate, Ur Screen: NOT DETECTED
Phencyclidine (PCP) Ur S: NOT DETECTED
Tricyclic, Ur Screen: NOT DETECTED

## 2022-04-18 LAB — CBC
HCT: 45.4 % (ref 39.0–52.0)
Hemoglobin: 15.1 g/dL (ref 13.0–17.0)
MCH: 30.3 pg (ref 26.0–34.0)
MCHC: 33.3 g/dL (ref 30.0–36.0)
MCV: 91 fL (ref 80.0–100.0)
Platelets: 154 10*3/uL (ref 150–400)
RBC: 4.99 MIL/uL (ref 4.22–5.81)
RDW: 14.4 % (ref 11.5–15.5)
WBC: 9.5 10*3/uL (ref 4.0–10.5)
nRBC: 0 % (ref 0.0–0.2)

## 2022-04-18 LAB — COMPREHENSIVE METABOLIC PANEL
ALT: 24 U/L (ref 0–44)
AST: 28 U/L (ref 15–41)
Albumin: 4.3 g/dL (ref 3.5–5.0)
Alkaline Phosphatase: 68 U/L (ref 38–126)
Anion gap: 9 (ref 5–15)
BUN: 12 mg/dL (ref 6–20)
CO2: 24 mmol/L (ref 22–32)
Calcium: 9.2 mg/dL (ref 8.9–10.3)
Chloride: 107 mmol/L (ref 98–111)
Creatinine, Ser: 0.89 mg/dL (ref 0.61–1.24)
GFR, Estimated: >60 mL/min (ref >60)
Glucose, Bld: 105 mg/dL — ABNORMAL HIGH (ref 70–99)
Potassium: 3.9 mmol/L (ref 3.5–5.1)
Sodium: 140 mmol/L (ref 135–145)
Total Bilirubin: 1.4 mg/dL — ABNORMAL HIGH (ref 0.3–1.2)
Total Protein: 7.3 g/dL (ref 6.5–8.1)

## 2022-04-18 LAB — PROTIME-INR
INR: 5.3 (ref 0.8–1.2)
Prothrombin Time: 48.4 seconds — ABNORMAL HIGH (ref 11.4–15.2)

## 2022-04-18 LAB — RESP PANEL BY RT-PCR (FLU A&B, COVID) ARPGX2
Influenza A by PCR: NEGATIVE
Influenza B by PCR: NEGATIVE
SARS Coronavirus 2 by RT PCR: NEGATIVE

## 2022-04-18 LAB — ETHANOL: Alcohol, Ethyl (B): <10 mg/dL (ref <10)

## 2022-04-18 LAB — TROPONIN I (HIGH SENSITIVITY): Troponin I (High Sensitivity): 12 ng/L (ref <18)

## 2022-04-18 NOTE — Discharge Instructions (Addendum)
Your INR was 5.3. Please hold the Coumadin for the next 1 to 2 days.  You can then resume at 7.5 mg daily.  Please follow-up with the coagulation clinic as soon as possible to have the INR rechecked, ideally by Friday.  Please bring your home INR machine to the office so that it can be checked to make sure it is not malfunctioning.   Please follow-up with neurology regarding the change in mental status.

## 2022-04-18 NOTE — ED Notes (Signed)
Critical lab relayed to Dr. Fuller Plan - INR 5.5

## 2022-04-18 NOTE — Consult Note (Signed)
ANTICOAGULATION CONSULT NOTE   Pharmacy Consult for Warfarin Indication:  Mechanical aortic valve  Allergies  Allergen Reactions   Other     No NSAIDS r/t blood thinners    Patient Measurements: Weight: 68.9 kg (152 lb)   Vital Signs: Temp: 98.4 F (36.9 C) (11/21 1333) Temp Source: Oral (11/21 1333) BP: 107/76 (11/21 1410) Pulse Rate: 75 (11/21 1410)  Labs: Recent Labs    04/18/22 1335 04/18/22 1413  HGB 15.1  --   HCT 45.4  --   PLT 154  --   LABPROT 48.4*  --   INR 5.3*  --   CREATININE 0.89  --   TROPONINIHS  --  12    Estimated Creatinine Clearance: 102.1 mL/min (by C-G formula based on SCr of 0.89 mg/dL).   Medical History: Past Medical History:  Diagnosis Date   CHF (congestive heart failure) (HCC)    Congenital bicuspid aortic valve 09/2014   - s/p AVR   Nonischemic cardiomyopathy (HCC) -- Resolved[I42.9] 09/2014   EF by Echo 20-25% (pre-op AVR) --> Echo 10/2014 & 03/2015: EF 50-55% - also Recent Normal Diastolic parameters    S/P AVR (aortic valve replacement) 10/2014   21 mm Carbometrics Mechanical Valve -- Epicardial PPM Leads   Severe aortic stenosis 10/04/2014   Presented with Syncope & CHF    Medications:  Patient reports taking warfarin 10 mg po daily.  Last dose taken 11/21 in the AM.  Assessment: 46 yo male presents to ED with elevated INR.  No signs of bleeding, patient reported INR > 8 on home monitor.  In the ED INR was 5.3.  Consulted for discharge warfarin plan recommendation.  Patient and family do not recall any recent changes to medications.  Goal of Therapy:  INR 2.5-3.5 Monitor platelets by anticoagulation protocol: Yes   Plan:  I would recommend holding warfarin for next 1-2 doses and follow-up with anticoag clinic this week if possible.  If unable to get to clinic this week, then dose could be reduced to warfarin 7.5 mg daily until able to be seen.  Barrie Folk, PharmD 04/18/2022,3:42 PM

## 2022-04-18 NOTE — ED Triage Notes (Signed)
Pt wife sts that pt is a pt at [facility] in [place]. Wife sts that pt has not been acting himself and that his PT, INR levels are extremely high and was told to bring him to the nearest ER.

## 2022-04-18 NOTE — ED Provider Notes (Addendum)
Patient signed out to me pending labs and urine.  In brief this is a 46 year old male with prosthetic aortic valve on Coumadin presents because of elevated INR.  It was 9 at home he was instructed to come to the emergency department.  INR here is 5.3.  He is not having any active bleeding.  Patient's wife is also saying that he has been confused.  I evaluated the patient and patient's wife is telling me that this has been going on for several months.  This past Sunday he gave his information to hackers which is not usual for him.  She tells me that this typically happens when his INR is elevated.  Apparently patient is also complained of headache and dizziness.  Patient denies complaints at this time.  The patient has a flat affect but he is alert and oriented x 3 and his neurologic exam including cranial nerves and strength and sensation are nonfocal, additionally patient is able to ambulate with steady gait.  CT head is negative.  CBC CMP are reassuring ammonia is normal ethanol level is negative.  Pending UDS and urinalysis.  Given nonfocal neurologic exam and negative CT head do not feel that any further imaging in the ED is necessary.  Patient does have a history of PTSD and notes from 2022 to mention that he has had depression and psychosis and dissociative episodes with the PTSD and I question whether what we are seeing now is primarily psychiatric.  I do think that he would benefit from further neurologic workup but I think this can be deferred to the outpatient setting.  Discussed plan for neurology outpatient follow-up with patient's wife and she is comfortable with this plan.   Georga Hacking, MD 04/18/22 1528    Georga Hacking, MD 04/18/22 1531    Georga Hacking, MD 04/18/22 [phone]

## 2022-04-18 NOTE — ED Notes (Signed)
Dr. Funke at the bedside 

## 2022-04-18 NOTE — ED Provider Notes (Addendum)
[facility] Provider Note    Event Date/Time   First MD Initiated Contact with Patient 04/18/22 1410     (approximate)   History   Altered Mental Status   HPI  Nicholas Horton is a 46 y.o. male with a history of CHF, mechanical aortic valve on Coumadin who comes in with elevated INR.  Patient had an INR check on 11/2 that was also elevated he denied any bleeding symptoms at that time and INR was slightly elevated at 5.7 and patient was discharged home with a plan to hold it for 2 days.  However this morning he had another recheck of his INR and now it was greater than 9 again.  He according to his girlfriend has not been acting his normal self for the past week where he gave over a bunch of information to some hackers just not able to drive and take care of himself like he typically is able to therefore they brought him into the ER for evaluation.  They deny any black or tarry stools or chest pain.  No known falls. No recent falls.     Physical Exam   Triage Vital Signs: ED Triage Vitals  Enc Vitals Group     BP 04/18/22 1333 112/66     Pulse Rate 04/18/22 1333 84     Resp 04/18/22 1333 17     Temp 04/18/22 1333 98.4 F (36.9 C)     Temp Source 04/18/22 1333 Oral     SpO2 04/18/22 1333 95 %     Weight 04/18/22 1331 152 lb (68.9 kg)     Height --      Head Circumference --      Peak Flow --      Pain Score 04/18/22 1331 0     Pain Loc --      Pain Edu? --      Excl. in [place]? --     Most recent vital signs: Vitals:   04/18/22 1333  BP: 112/66  Pulse: 84  Resp: 17  Temp: 98.4 F (36.9 C)  SpO2: 95%     General: Awake, no distress.  CV:  Good peripheral perfusion.  Resp:  Normal effort.  Abd:  No distention.  Other:  Cranial nerves appear intact equal strength in arms and leg.   ED Results / Procedures / Treatments   Labs (all labs ordered are listed, but only abnormal results are displayed) Labs Reviewed  COMPREHENSIVE METABOLIC  PANEL - Abnormal; Notable for the following components:      Result Value   Glucose, Bld 105 (*)    Total Bilirubin 1.4 (*)    All other components within normal limits  CBC  PROTIME-INR  AMMONIA  TROPONIN I (HIGH SENSITIVITY)     EKG  My interpretation of EKG:  Normal sinus rate of 77 with left bundle branch block without any ST elevation, no t wave inversion  RADIOLOGY I have reviewed the CT head personally and interpreted and no ICH     PROCEDURES:  Critical Care performed: No  .1-3 Lead EKG Interpretation  Performed by: Vanessa Orland, MD Authorized by: Vanessa Bonita, MD     Interpretation: normal     ECG rate:  80   ECG rate assessment: normal     Rhythm: sinus rhythm     Ectopy: none     Conduction: normal      MEDICATIONS ORDERED IN ED: Medications - No data to  display   IMPRESSION / MDM / ASSESSMENT AND PLAN / ED COURSE  I reviewed the triage vital signs and the nursing notes.   Patient's presentation is most consistent with acute presentation with potential threat to life or bodily function.   Patient comes in with concerns for altered mental status patient is awake and alert is able to answer my questions but there is some concern from girlfriend that he allowed himself to get hacked and he is not able to drive and just not acting his normal self.  Given the concern with elevated INR will get CT head to evaluate for any evidence of intercranial hemorrhage although the signs of this is been going on for a few days now.  We will also screen for elevated ammonia, EtOH, drug abuse, COVID, ACS although these seem to be less likely.   Total bilirubin slightly elevated on CMP.  CBC normal.  Patient be handed off to oncoming team pending the rest of the blood work, CT imaging  INR is 5.3. will d/w pharm on recs for warfarin dosing if pt is dc or stays in hospital.    The patient is on the cardiac monitor to evaluate for evidence of arrhythmia and/or  significant heart rate changes.      FINAL CLINICAL IMPRESSION(S) / ED DIAGNOSES   Final diagnoses:  Altered mental status, unspecified altered mental status type  Elevated INR     Rx / DC Orders   ED Discharge Orders     None        Note:  This document was prepared using Dragon voice recognition software and may include unintentional dictation errors.   Concha Se, MD 04/18/22 1422    Concha Se, MD 04/18/22 1450

## 2022-04-28 NOTE — Progress Notes (Signed)
Pt was alerted and would be candidate for viral therapy given comorbidies.

## 2023-10-11 LAB — COLOGUARD: COLOGUARD: NEGATIVE

## 2024-06-09 ENCOUNTER — Other Ambulatory Visit: Payer: Self-pay

## 2024-06-09 ENCOUNTER — Encounter: Payer: Self-pay | Admitting: Emergency Medicine

## 2024-06-09 DIAGNOSIS — R059 Cough, unspecified: Secondary | ICD-10-CM | POA: Diagnosis present

## 2024-06-09 DIAGNOSIS — E119 Type 2 diabetes mellitus without complications: Secondary | ICD-10-CM | POA: Insufficient documentation

## 2024-06-09 DIAGNOSIS — J069 Acute upper respiratory infection, unspecified: Secondary | ICD-10-CM | POA: Insufficient documentation

## 2024-06-09 DIAGNOSIS — J441 Chronic obstructive pulmonary disease with (acute) exacerbation: Secondary | ICD-10-CM | POA: Diagnosis not present

## 2024-06-09 DIAGNOSIS — Z7901 Long term (current) use of anticoagulants: Secondary | ICD-10-CM | POA: Diagnosis not present

## 2024-06-09 LAB — RESP PANEL BY RT-PCR (RSV, FLU A&B, COVID)  RVPGX2
Influenza A by PCR: NEGATIVE
Influenza B by PCR: NEGATIVE
Resp Syncytial Virus by PCR: NEGATIVE
SARS Coronavirus 2 by RT PCR: NEGATIVE

## 2024-06-09 LAB — GROUP A STREP BY PCR: Group A Strep by PCR: NOT DETECTED

## 2024-06-09 NOTE — ED Triage Notes (Signed)
 Pt presents to the ED via POV with complaints of cough, nasal congestion, sore throat, and headache x 1 day.  A&Ox4 at this time. Denies fevers, chills, CP or SOB.

## 2024-06-10 ENCOUNTER — Emergency Department
Admission: EM | Admit: 2024-06-10 | Discharge: 2024-06-10 | Disposition: A | Attending: Emergency Medicine | Admitting: Emergency Medicine

## 2024-06-10 DIAGNOSIS — J069 Acute upper respiratory infection, unspecified: Secondary | ICD-10-CM

## 2024-06-10 DIAGNOSIS — J441 Chronic obstructive pulmonary disease with (acute) exacerbation: Secondary | ICD-10-CM

## 2024-06-10 MED ORDER — PREDNISONE 20 MG PO TABS
60.0000 mg | ORAL_TABLET | Freq: Once | ORAL | Status: AC
  Administered 2024-06-10: 60 mg via ORAL
  Filled 2024-06-10: qty 3

## 2024-06-10 MED ORDER — IPRATROPIUM-ALBUTEROL 0.5-2.5 (3) MG/3ML IN SOLN
6.0000 mL | Freq: Once | RESPIRATORY_TRACT | Status: AC
  Administered 2024-06-10: 6 mL via RESPIRATORY_TRACT
  Filled 2024-06-10: qty 6

## 2024-06-10 MED ORDER — PREDNISONE 50 MG PO TABS: 50.0000 mg | ORAL_TABLET | Freq: Every day | ORAL | 0 refills | Status: AC

## 2024-06-10 NOTE — ED Provider Notes (Signed)
 "  [facility] Provider Note    Event Date/Time   First MD Initiated Contact with Patient 06/10/24 0041     (approximate)   History   Cough   HPI  Nicholas Horton is a 49 y.o. male who presents to the ED for evaluation of Cough   I reviewed PCP visit from last week.  History of DM, obesity, AVR, and pacer for CHB on warfarin, COPD  Patient presents to the ED for evaluation of 1 day of nonproductive cough, headache, sore throat, congestion  Physical Exam   Triage Vital Signs: ED Triage Vitals  Encounter Vitals Group     BP 06/09/24 2021 130/74     Girls Systolic BP Percentile --      Girls Diastolic BP Percentile --      Boys Systolic BP Percentile --      Boys Diastolic BP Percentile --      Pulse Rate 06/09/24 2021 71     Resp 06/09/24 2021 18     Temp 06/09/24 2021 98.7 F (37.1 C)     Temp src --      SpO2 06/09/24 2021 100 %     Weight 06/09/24 2020 245 lb (111.1 kg)     Height 06/09/24 2020 6' 2 (1.88 m)     Head Circumference --      Peak Flow --      Pain Score 06/09/24 2020 7     Pain Loc --      Pain Education --      Exclude from Growth Chart --     Most recent vital signs: Vitals:   06/09/24 2021 06/10/24 0055  BP: 130/74 119/78  Pulse: 71 64  Resp: 18 19  Temp: 98.7 F (37.1 C) 98.5 F (36.9 C)  SpO2: 100% 98%    General: Awake, no distress.  CV:  Good peripheral perfusion.  Resp:  Minimal tachypnea to the low 20s, no distress, wheezing throughout and slightly decreased airflow Abd:  No distention.  MSK:  No deformity noted.  Neuro:  No focal deficits appreciated. Other:     ED Results / Procedures / Treatments   Labs (all labs ordered are listed, but only abnormal results are displayed) Labs Reviewed  GROUP A STREP BY PCR  RESP PANEL BY RT-PCR (RSV, FLU A&B, COVID)  [id number]    EKG   RADIOLOGY   Official radiology report(s): No results found.  PROCEDURES and  INTERVENTIONS:  Procedures  Medications  ipratropium-albuterol  (DUONEB) 0.5-2.5 (3) MG/3ML nebulizer solution 6 mL (6 mLs Nebulization Given 06/10/24 0104)  predniSONE  (DELTASONE ) tablet 60 mg (60 mg Oral Given 06/10/24 0104)     IMPRESSION / MDM / ASSESSMENT AND PLAN / ED COURSE  I reviewed the triage vital signs and the nursing notes.  Differential diagnosis includes, but is not limited to, viral URI, COPD exacerbation, pneumonia, pneumothorax  {Patient presents with symptoms of an acute illness or injury that is potentially life-threatening.  Patient presents with signs of a COPD exacerbation likely precipitated by viral syndrome and suitable for outpatient management with a few days of steroids.  Wheezing on exam that resolves with a DuoNeb, otherwise looks well.  Normal vitals, negative viral swabs, negative strep test.  Clinical Course as of 06/10/24 0305  Tue Jun 10, 2024  0140 Reassessed, feeling better after the nebulizer, lungs sound much better, no longer wheezing on auscultation.  We again discussed steroid burst at home, he acknowledges having albuterol   at home.  We discussed close ED return precautions. [DS]    Clinical Course User Index [DS] Claudene Rover, MD     FINAL CLINICAL IMPRESSION(S) / ED DIAGNOSES   Final diagnoses:  COPD exacerbation (HCC)  Viral URI with cough     Rx / DC Orders   ED Discharge Orders          Ordered    predniSONE  (DELTASONE ) 50 MG tablet  Daily        06/10/24 0056             Note:  This document was prepared using Dragon voice recognition software and may include unintentional dictation errors.   Claudene Rover, MD 06/10/24 [phone]  "
